# Patient Record
Sex: Female | Born: 1945 | Race: Black or African American | Hispanic: No | Smoking: Never smoker
Health system: Southern US, Academic
[De-identification: ages and names within clinical notes are randomized; demographics above are authoritative.]

## PROBLEM LIST (undated history)

## (undated) ENCOUNTER — Inpatient Hospital Stay: Payer: Self-pay

## (undated) DIAGNOSIS — Z83719 Family history of colon polyps, unspecified: Secondary | ICD-10-CM

## (undated) DIAGNOSIS — E039 Hypothyroidism, unspecified: Secondary | ICD-10-CM

## (undated) DIAGNOSIS — N63 Unspecified lump in unspecified breast: Secondary | ICD-10-CM

## (undated) DIAGNOSIS — E785 Hyperlipidemia, unspecified: Secondary | ICD-10-CM

## (undated) DIAGNOSIS — G2581 Restless legs syndrome: Secondary | ICD-10-CM

## (undated) DIAGNOSIS — J45909 Unspecified asthma, uncomplicated: Secondary | ICD-10-CM

## (undated) DIAGNOSIS — F32A Depression, unspecified: Secondary | ICD-10-CM

## (undated) DIAGNOSIS — F329 Major depressive disorder, single episode, unspecified: Secondary | ICD-10-CM

## (undated) DIAGNOSIS — D649 Anemia, unspecified: Secondary | ICD-10-CM

## (undated) DIAGNOSIS — K219 Gastro-esophageal reflux disease without esophagitis: Secondary | ICD-10-CM

## (undated) DIAGNOSIS — Z8371 Family history of colonic polyps: Secondary | ICD-10-CM

## (undated) DIAGNOSIS — Z800 Family history of malignant neoplasm of digestive organs: Secondary | ICD-10-CM

## (undated) DIAGNOSIS — N6310 Unspecified lump in the right breast, unspecified quadrant: Secondary | ICD-10-CM

## (undated) DIAGNOSIS — R112 Nausea with vomiting, unspecified: Secondary | ICD-10-CM

## (undated) DIAGNOSIS — H269 Unspecified cataract: Secondary | ICD-10-CM

## (undated) DIAGNOSIS — R12 Heartburn: Secondary | ICD-10-CM

## (undated) DIAGNOSIS — C801 Malignant (primary) neoplasm, unspecified: Secondary | ICD-10-CM

## (undated) DIAGNOSIS — E669 Obesity, unspecified: Secondary | ICD-10-CM

## (undated) DIAGNOSIS — I749 Embolism and thrombosis of unspecified artery: Secondary | ICD-10-CM

## (undated) DIAGNOSIS — E041 Nontoxic single thyroid nodule: Secondary | ICD-10-CM

## (undated) DIAGNOSIS — E119 Type 2 diabetes mellitus without complications: Secondary | ICD-10-CM

## (undated) DIAGNOSIS — R634 Abnormal weight loss: Secondary | ICD-10-CM

## (undated) DIAGNOSIS — Z973 Presence of spectacles and contact lenses: Secondary | ICD-10-CM

## (undated) DIAGNOSIS — I10 Essential (primary) hypertension: Secondary | ICD-10-CM

## (undated) DIAGNOSIS — H409 Unspecified glaucoma: Secondary | ICD-10-CM

## (undated) HISTORY — DX: Abnormal weight loss: R63.4

## (undated) HISTORY — DX: Unspecified cataract: H26.9

## (undated) HISTORY — DX: Essential (primary) hypertension: I10

## (undated) HISTORY — DX: Embolism and thrombosis of unspecified artery (CMS HCC): I74.9

## (undated) HISTORY — DX: Family history of malignant neoplasm of digestive organs: Z80.0

## (undated) HISTORY — DX: Unspecified lump in the right breast, unspecified quadrant: N63.10

## (undated) HISTORY — PX: HX COLONOSCOPY: 2100001147

## (undated) HISTORY — PX: HX BREAST BIOPSY: SHX20

## (undated) HISTORY — PX: HYSTERECTOMY: SHX81

## (undated) HISTORY — PX: CATARACT EXTRACTION, BILATERAL: SHX1313

## (undated) HISTORY — PX: SHOULDER SURGERY: SHX246

## (undated) HISTORY — DX: Unspecified glaucoma: H40.9

---
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
## 1973-10-10 HISTORY — PX: APPENDECTOMY: SHX54

## 1974-10-10 HISTORY — PX: ABDOMINAL HYSTERECTOMY: SHX81

## 1991-10-11 HISTORY — PX: ABDOMINOPLASTY: SUR9

## 1992-09-10 ENCOUNTER — Ambulatory Visit: Admit: 1992-09-10 | Disposition: A | Discharge status: Home / Self Care | Payer: Self-pay | Source: Ambulatory Visit

## 1993-12-17 ENCOUNTER — Ambulatory Visit: Admit: 1993-12-17 | Disposition: A | Discharge status: Home / Self Care | Payer: Self-pay | Source: Ambulatory Visit

## 1994-08-25 ENCOUNTER — Ambulatory Visit: Admit: 1994-08-25 | Disposition: A | Discharge status: Home / Self Care | Payer: Self-pay | Source: Ambulatory Visit

## 1994-09-02 ENCOUNTER — Ambulatory Visit: Admit: 1994-09-02 | Disposition: A | Discharge status: Home / Self Care | Payer: Self-pay | Source: Ambulatory Visit

## 1995-12-27 ENCOUNTER — Ambulatory Visit: Admit: 1995-12-27 | Disposition: A | Discharge status: Home / Self Care | Payer: Self-pay | Source: Ambulatory Visit

## 1996-02-02 ENCOUNTER — Ambulatory Visit: Admit: 1996-02-02 | Disposition: A | Discharge status: Home / Self Care | Payer: Self-pay | Source: Ambulatory Visit

## 1996-07-02 ENCOUNTER — Ambulatory Visit: Admit: 1996-07-02 | Disposition: A | Discharge status: Home / Self Care | Payer: Self-pay | Source: Ambulatory Visit

## 1997-10-10 HISTORY — PX: HX HYSTERECTOMY: SHX81

## 2001-11-08 ENCOUNTER — Ambulatory Visit: Payer: Self-pay

## 2001-12-04 ENCOUNTER — Ambulatory Visit: Payer: Self-pay

## 2002-10-30 ENCOUNTER — Ambulatory Visit: Payer: Self-pay

## 2002-12-07 ENCOUNTER — Ambulatory Visit: Payer: Self-pay

## 2003-07-10 ENCOUNTER — Emergency Department: Payer: Self-pay

## 2003-12-24 ENCOUNTER — Ambulatory Visit: Payer: Self-pay

## 2005-01-06 ENCOUNTER — Ambulatory Visit: Payer: Self-pay

## 2006-01-06 ENCOUNTER — Ambulatory Visit: Payer: Self-pay

## 2006-05-27 ENCOUNTER — Ambulatory Visit: Payer: Self-pay

## 2006-06-02 ENCOUNTER — Other Ambulatory Visit: Payer: Self-pay

## 2006-06-08 ENCOUNTER — Ambulatory Visit: Payer: Self-pay | Admitting: Obstetrics & Gynecology

## 2006-10-10 HISTORY — PX: BLADDER SUSPENSION: SHX72

## 2007-01-09 ENCOUNTER — Ambulatory Visit: Payer: Self-pay

## 2007-02-12 ENCOUNTER — Ambulatory Visit: Payer: Self-pay

## 2007-06-07 ENCOUNTER — Ambulatory Visit: Payer: Self-pay

## 2007-06-22 ENCOUNTER — Ambulatory Visit: Payer: Self-pay

## 2007-06-29 ENCOUNTER — Ambulatory Visit: Payer: Self-pay

## 2008-01-10 ENCOUNTER — Ambulatory Visit: Payer: Self-pay

## 2008-03-25 ENCOUNTER — Ambulatory Visit: Payer: Self-pay

## 2008-04-15 ENCOUNTER — Ambulatory Visit: Payer: Self-pay | Admitting: Internal Medicine

## 2008-04-29 ENCOUNTER — Emergency Department: Payer: Self-pay | Admitting: Emergency Medicine

## 2008-11-28 ENCOUNTER — Ambulatory Visit: Payer: Self-pay | Admitting: Internal Medicine

## 2009-01-01 ENCOUNTER — Ambulatory Visit: Payer: Self-pay | Admitting: Cardiovascular Disease

## 2009-01-12 ENCOUNTER — Ambulatory Visit: Admission: RE | Admit: 2009-01-12 | Payer: Self-pay | Source: Ambulatory Visit | Admitting: EXTERNAL

## 2009-05-07 ENCOUNTER — Ambulatory Visit: Payer: Self-pay | Admitting: Internal Medicine

## 2009-11-09 ENCOUNTER — Ambulatory Visit: Admission: RE | Admit: 2009-11-09 | Payer: Self-pay | Source: Ambulatory Visit | Admitting: EXTERNAL

## 2010-01-13 ENCOUNTER — Ambulatory Visit: Admission: RE | Admit: 2010-01-13 | Payer: Self-pay | Source: Ambulatory Visit | Admitting: EXTERNAL

## 2010-02-25 ENCOUNTER — Ambulatory Visit (INDEPENDENT_AMBULATORY_CARE_PROVIDER_SITE_OTHER): Payer: Managed Care, Other (non HMO) | Admitting: Orthopaedic Surgery

## 2010-03-25 ENCOUNTER — Encounter (INDEPENDENT_AMBULATORY_CARE_PROVIDER_SITE_OTHER): Payer: Managed Care, Other (non HMO) | Admitting: Orthopaedic Surgery

## 2010-03-30 ENCOUNTER — Ambulatory Visit: Admission: RE | Admit: 2010-03-30 | Payer: Self-pay | Source: Ambulatory Visit | Admitting: Orthopaedic Surgery

## 2010-04-05 ENCOUNTER — Encounter (INDEPENDENT_AMBULATORY_CARE_PROVIDER_SITE_OTHER): Payer: Managed Care, Other (non HMO) | Admitting: Orthopaedic Surgery

## 2010-04-05 ENCOUNTER — Encounter (INDEPENDENT_AMBULATORY_CARE_PROVIDER_SITE_OTHER): Payer: Self-pay | Admitting: Orthopaedic Surgery

## 2010-06-17 ENCOUNTER — Ambulatory Visit: Payer: Self-pay | Admitting: Internal Medicine

## 2010-06-18 ENCOUNTER — Ambulatory Visit: Payer: Self-pay | Admitting: Internal Medicine

## 2010-08-18 ENCOUNTER — Ambulatory Visit: Admit: 2010-08-18 | Payer: Self-pay | Source: Ambulatory Visit | Admitting: EXTERNAL

## 2010-12-20 ENCOUNTER — Ambulatory Visit: Payer: Self-pay | Admitting: Internal Medicine

## 2011-01-17 ENCOUNTER — Ambulatory Visit: Admission: RE | Admit: 2011-01-17 | Payer: Self-pay | Source: Ambulatory Visit | Admitting: EXTERNAL

## 2011-04-05 ENCOUNTER — Ambulatory Visit
Admission: RE | Admit: 2011-04-05 | Discharge: 2011-04-05 | Payer: Self-pay | Source: Ambulatory Visit | Attending: EXTERNAL | Admitting: EXTERNAL

## 2011-04-14 ENCOUNTER — Ambulatory Visit
Admission: RE | Admit: 2011-04-14 | Discharge: 2011-04-14 | Payer: Self-pay | Source: Ambulatory Visit | Attending: EXTERNAL | Admitting: EXTERNAL

## 2011-06-20 ENCOUNTER — Ambulatory Visit: Payer: Self-pay | Admitting: Internal Medicine

## 2011-12-26 ENCOUNTER — Other Ambulatory Visit (HOSPITAL_BASED_OUTPATIENT_CLINIC_OR_DEPARTMENT_OTHER): Payer: Self-pay

## 2012-01-18 ENCOUNTER — Ambulatory Visit (HOSPITAL_BASED_OUTPATIENT_CLINIC_OR_DEPARTMENT_OTHER)
Admission: RE | Admit: 2012-01-18 | Discharge: 2012-01-18 | Disposition: A | Discharge status: Home / Self Care | Payer: 59 | Source: Ambulatory Visit

## 2012-07-02 ENCOUNTER — Ambulatory Visit: Payer: Self-pay | Admitting: Internal Medicine

## 2012-07-06 ENCOUNTER — Ambulatory Visit: Payer: Self-pay | Admitting: Internal Medicine

## 2012-10-16 ENCOUNTER — Other Ambulatory Visit (HOSPITAL_BASED_OUTPATIENT_CLINIC_OR_DEPARTMENT_OTHER): Payer: Self-pay

## 2012-10-16 ENCOUNTER — Ambulatory Visit (HOSPITAL_BASED_OUTPATIENT_CLINIC_OR_DEPARTMENT_OTHER)
Admission: RE | Admit: 2012-10-16 | Discharge: 2012-10-16 | Disposition: A | Discharge status: Home / Self Care | Payer: 59 | Source: Ambulatory Visit

## 2012-10-16 DIAGNOSIS — R229 Localized swelling, mass and lump, unspecified: Secondary | ICD-10-CM | POA: Insufficient documentation

## 2013-01-03 ENCOUNTER — Other Ambulatory Visit (HOSPITAL_BASED_OUTPATIENT_CLINIC_OR_DEPARTMENT_OTHER): Payer: Self-pay

## 2013-01-21 ENCOUNTER — Other Ambulatory Visit (HOSPITAL_BASED_OUTPATIENT_CLINIC_OR_DEPARTMENT_OTHER): Payer: Self-pay

## 2013-01-21 ENCOUNTER — Ambulatory Visit (HOSPITAL_BASED_OUTPATIENT_CLINIC_OR_DEPARTMENT_OTHER)
Admission: RE | Admit: 2013-01-21 | Discharge: 2013-01-21 | Disposition: A | Discharge status: Home / Self Care | Payer: 59 | Source: Ambulatory Visit

## 2013-01-21 DIAGNOSIS — Z1231 Encounter for screening mammogram for malignant neoplasm of breast: Secondary | ICD-10-CM | POA: Insufficient documentation

## 2013-01-28 ENCOUNTER — Other Ambulatory Visit (HOSPITAL_BASED_OUTPATIENT_CLINIC_OR_DEPARTMENT_OTHER): Payer: Self-pay | Admitting: Diagnostic Radiology

## 2013-01-28 ENCOUNTER — Other Ambulatory Visit (HOSPITAL_BASED_OUTPATIENT_CLINIC_OR_DEPARTMENT_OTHER): Payer: Self-pay

## 2013-01-28 ENCOUNTER — Other Ambulatory Visit: Payer: Self-pay

## 2013-01-28 ENCOUNTER — Inpatient Hospital Stay (HOSPITAL_BASED_OUTPATIENT_CLINIC_OR_DEPARTMENT_OTHER)
Admission: RE | Admit: 2013-01-28 | Discharge: 2013-01-28 | Disposition: A | Discharge status: Home / Self Care | Payer: 59 | Source: Ambulatory Visit

## 2013-01-28 ENCOUNTER — Other Ambulatory Visit (HOSPITAL_BASED_OUTPATIENT_CLINIC_OR_DEPARTMENT_OTHER): Payer: 59

## 2013-01-28 DIAGNOSIS — R928 Other abnormal and inconclusive findings on diagnostic imaging of breast: Secondary | ICD-10-CM | POA: Insufficient documentation

## 2013-01-28 DIAGNOSIS — N6019 Diffuse cystic mastopathy of unspecified breast: Secondary | ICD-10-CM | POA: Insufficient documentation

## 2013-01-28 MED ORDER — LIDOCAINE 1 %-EPINEPHRINE 1:100,000 INJECTION SOLUTION
15.00 mL | INTRAMUSCULAR | Status: DC
Start: 2013-01-28 — End: 2013-01-29
  Filled 2013-01-28: qty 20

## 2013-01-28 MED ORDER — LIDOCAINE 1 %-EPINEPHRINE 1:100,000 INJECTION SOLUTION
15.00 mL | INTRAMUSCULAR | Status: AC
Start: 2013-01-28 — End: 2013-01-28
  Administered 2013-01-28: 18 mL via INTRAMUSCULAR
  Filled 2013-01-28: qty 20

## 2013-01-29 LAB — HISTORICAL SURGICAL PATHOLOGY SPECIMEN

## 2013-01-30 ENCOUNTER — Other Ambulatory Visit (HOSPITAL_BASED_OUTPATIENT_CLINIC_OR_DEPARTMENT_OTHER): Payer: Self-pay | Admitting: Diagnostic Radiology

## 2013-02-26 ENCOUNTER — Ambulatory Visit: Payer: 59 | Admitting: Geriatric Medicine

## 2013-02-26 ENCOUNTER — Encounter: Payer: Self-pay | Admitting: Geriatric Medicine

## 2013-02-26 VITALS — BP 120/70 | HR 56 | Temp 97.8°F | Resp 14 | Ht 65.0 in | Wt 168.0 lb

## 2013-02-26 NOTE — Progress Notes (Signed)
Subjective:     Patient ID:  Carrie Escobar is an 67 y.o. female     Chief Complaint:    Chief Complaint   Patient presents with   . Shoulder Pain       Patient is a 67 y.o. female presenting with shoulder injury. The history is provided by the patient. No language interpreter was used.   Shoulder Injury   Incident location: no recent injury/fall. 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 The right shoulder is affected. Incident onset: one month. The injury mechanism was repetitive motion. The quality of the pain is described as aching. The pain does not radiate. The pain is at a severity of 3/10. Pertinent negatives include no chest pain, muscle weakness, numbness or tingling. The symptoms are aggravated by palpation and overhead lifting. She has tried nothing for the symptoms. The treatment provided no relief.       Past Medical History  Current Outpatient Prescriptions   Medication Sig   . amLODIPine (NORVASC) 5 mg Oral Tablet Take 5 mg by mouth Once a day   . aspirin (ECOTRIN) 81 mg Oral Tablet, Delayed Release (E.C.) Take 81 mg by mouth Once a day   . bisoprolol-hydrochlorothiazide (ZIAC) 10-6.25 mg Oral Tablet Take 1 Tab by mouth Once a day   . calcium citrate-vitamin D3 (CITRACAL) 200 mg calcium -250 unit Oral Tablet Take 1 Tab by mouth Once a day     Allergies   Allergen Reactions   . Amoxicillin Rash     Past Medical History   Diagnosis Date   . Breast mass, right    . HTN (hypertension)    . Cataract    . Pulmonary emboli      only once in 1999 after her hysterectomy   . Family history of colon cancer      Past Surgical History   Procedure Laterality Date   . Hx hysterectomy  1999     fibroid     Family History   Problem Relation Age of Onset   . Stroke Mother    . Stroke Father    . Cancer Sister      ovaian   . Hypertension Mother    . Cancer Mother      colon     History     Social History   . Marital Status: Divorced     Spouse Name: N/A     Number of Children: 0    . Years of Education: N/A     Occupational History   .       Social History Main Topics   . Smoking status: Never Smoker    . Smokeless tobacco: Not on file   . Alcohol Use: No   . Drug Use: No   . Sexually Active: Not on file     Other Topics Concern   . Abuse/Domestic Violence No   . Right Hand Dominant Yes     Social History Narrative   . No narrative on file         Review of Systems   Constitutional: Negative.    HENT: Negative.  Negative for neck pain.    Respiratory: Negative.    Cardiovascular: Negative.  Negative for chest pain.   Musculoskeletal: Positive for joint pain. Negative for back pain.   Skin: Negative.    Neurological: Negative.  Negative for tingling and numbness.  Objective:     BP 120/70   Pulse 56   Temp(Src) 36.6 C (97.8 F) (Tympanic)   Resp 14   Ht 1.651 m (5\' 5" )   Wt 76.204 kg (168 lb)   BMI 27.96 kg/m2   SpO2 98%     Physical Exam   Constitutional: She is oriented to person, place, and time. She appears well-developed and well-nourished.   Neck: Normal range of motion and full passive range of motion without pain. Neck supple. No spinous process tenderness and no muscular tenderness present.   Pulm:  Effort normal.    Ortho/Musculoskeletal:   She exhibits no edema.   Right Shoulder Exam   She exhibits tenderness (over AC joint), deformity (lipoma) and pain. She exhibits normal range of motion, no swelling, no effusion, no crepitus, no laceration, no spasm, normal pulse and normal strength.        Arms:  Neurological: She is alert and oriented to person, place, and time. Coordination and gait normal.   Nursing note and vitals reviewed.          .    Assessment & Plan:       ICD-9-CM    1. Shoulder pain, right 719.41 XR SHOULDER RIGHT SERIES   2. 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

## 2013-02-26 NOTE — Progress Notes (Signed)
Pt is here today for right shoulder pain. York Spaniel that she has had this pain for about a month. 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

## 2013-05-06 ENCOUNTER — Other Ambulatory Visit (HOSPITAL_BASED_OUTPATIENT_CLINIC_OR_DEPARTMENT_OTHER): Payer: Self-pay | Admitting: Geriatric Medicine

## 2013-05-06 NOTE — Addendum Note (Signed)
 Addended by: MARLYCE GAUZE on: 05/06/2013 02:55 PM     Modules accepted: Orders

## 2013-05-13 ENCOUNTER — Ambulatory Visit (HOSPITAL_BASED_OUTPATIENT_CLINIC_OR_DEPARTMENT_OTHER)
Admission: RE | Admit: 2013-05-13 | Discharge: 2013-05-13 | Disposition: A | Discharge status: Home / Self Care | Payer: 59 | Source: Ambulatory Visit | Attending: Geriatric Medicine | Admitting: Geriatric Medicine

## 2013-05-13 DIAGNOSIS — D1739 Benign lipomatous neoplasm of skin and subcutaneous tissue of other sites: Secondary | ICD-10-CM | POA: Insufficient documentation

## 2013-05-13 NOTE — Addendum Note (Signed)
 Addended by: QUIN NEST on: 05/13/2013 04:11 PM     Modules accepted: Orders

## 2013-05-29 ENCOUNTER — Encounter (INDEPENDENT_AMBULATORY_CARE_PROVIDER_SITE_OTHER): Payer: Self-pay

## 2013-05-29 ENCOUNTER — Ambulatory Visit (INDEPENDENT_AMBULATORY_CARE_PROVIDER_SITE_OTHER): Payer: No Typology Code available for payment source | Admitting: Physician Assistant

## 2013-05-29 VITALS — BP 171/81 | HR 70 | Temp 98.5°F | Resp 20 | Ht 64.5 in | Wt 165.3 lb

## 2013-05-29 DIAGNOSIS — J209 Acute bronchitis, unspecified: Secondary | ICD-10-CM

## 2013-05-29 DIAGNOSIS — R059 Cough, unspecified: Secondary | ICD-10-CM

## 2013-05-29 DIAGNOSIS — J029 Acute pharyngitis, unspecified: Secondary | ICD-10-CM

## 2013-05-29 LAB — POCT RAPID STREP A: Rapid Strep A Screen POCT: NEGATIVE

## 2013-05-29 MED ORDER — MAGIC MOUTHWASH ORAL SUSPENSION
ORAL | Status: DC
Start: 2013-05-29 — End: 2014-04-04

## 2013-05-29 MED ORDER — AZITHROMYCIN 250 MG PO TABS
ORAL_TABLET | ORAL | Status: DC
Start: 2013-05-29 — End: 2014-04-04

## 2013-05-29 MED ORDER — BENZONATATE 100 MG PO CAPS
100.0000 mg | ORAL_CAPSULE | Freq: Three times a day (TID) | ORAL | Status: AC | PRN
Start: 2013-05-29 — End: 2013-06-08

## 2013-05-29 NOTE — Progress Notes (Signed)
Subjective:    Patient ID: Amy Christensen is a 67 y.o. female.    HPI: has had sore throat for 3 days. And cough started on the same day. Basically dry cough. No fever. Never had strep as she can recall. Symptoms are unchanged. No sick contact.         Review of Systems   Constitutional: Negative for fever, chills, diaphoresis, activity change, appetite change, fatigue and unexpected weight change.   HENT: Positive for sore throat. Negative for ear pain and neck pain.    Eyes: Negative for pain.   Respiratory: Positive for cough.    Cardiovascular: Negative for chest pain.   Gastrointestinal: Negative for abdominal pain.   Genitourinary: Negative for flank pain and pelvic pain.   Musculoskeletal: Negative for back pain.   Skin: Negative for rash.   Neurological: Negative for headaches.   Psychiatric/Behavioral: Negative for behavioral problems.         Objective:    BP 171/81  Pulse 70  Temp 98.5 F (36.9 C) (Oral)  Resp 20  Ht 1.638 m (5' 4.5")  Wt 74.98 kg (165 lb 4.8 oz)  BMI 27.95 kg/m2  SpO2 100%    Physical Exam   Nursing note and vitals reviewed.  Constitutional: She is oriented to person, place, and time. She appears well-developed and well-nourished.   HENT:   Head: Normocephalic and atraumatic.   Mouth/Throat: Oropharyngeal exudate and posterior oropharyngeal erythema present.   Eyes: EOM are normal.   Neck: Normal range of motion.   Cardiovascular: Normal rate.    Pulmonary/Chest: Effort normal. She has rhonchi.   Musculoskeletal: Normal range of motion.   Neurological: She is alert and oriented to person, place, and time.   Skin: Skin is warm.   Psychiatric: She has a normal mood and affect.         Assessment and Plan:       Amy Christensen was seen today for cough and sore throat.    Diagnoses and associated orders for this visit:    Acute bronchitis  - azithromycin (ZITHROMAX Z-PAK) 250 MG tablet; Take two tablets on day one and take one tablet from day 2 to day 5.    Pharyngitis  - POCT RAPID STREP A:  negative  - DiphenhydrAMINE HCl (MAGIC MOUTHWASH) suspension; Use 10 ml to swish around mouth and spit out every 8 hours as needed.    Cough  - benzonatate (TESSALON PERLES) 100 MG capsule; Take 1 capsule (100 mg total) by mouth 3 (three) times daily as needed for Cough.    Please go to ER if symptoms persist, worsen or new symptoms develop. Follow up with your Primary Care Physician or Return to Clinic as needed. Patient/Family verbalizes understanding.            Danella Deis, PA  [facility]  05/29/2013  5:34 PM

## 2013-05-29 NOTE — Patient Instructions (Signed)
Please go to ER if symptoms persist, worsen or new symptoms develop. Follow up with your Primary Care Physician or Return to Clinic as needed. Patient/Family verbalizes understanding.      Bronchitis (Adult: Abx Tx)    BRONCHITIS is an infection of the air passages ("bronchial tubes"). It often occurs during the common cold. Symptoms include cough with mucus (phlegm) and low-grade fever. Bronchitis usually lasts 7-14 days. Mild cases can be treated with simple home remedies. More severe infection is treated with an antibiotic.    Home Care:  1. If symptoms are severe, rest at home for the first 2-3 days. When you resume activity, don't let yourself get too tired.  2. Do not smoke. Avoid being exposed to the smoke of others.  3. You may use acetaminophen (Tylenol) or ibuprofen (Motrin, Advil) to control fever or pain, unless another medicine was prescribed for this. [NOTE: If you have chronic liver or kidney disease or ever had a stomach ulcer or GI bleeding, talk with your doctor before using these medicines.]  4. Your appetite may be poor, so a light diet is fine. Avoid dehydration by drinking 6-8 glasses of fluids per day (water, soft, drinks, juices, tea, soup, etc.). Extra fluids will help loosen secretions in the lungs.  5. Over-the-counter cough medicines that contain"dextromethorphan"(such as Robitussin DM) and decongestants (Actifed or Sudafed) may help relieve cough and congestion. [NOTE: Do not use decongestants if you have high blood pressure.]  6. Finish all antibiotic medicine, even if you are feeling better after only a few days.  Follow Up  with your doctor or as directed if you don't start to feel better after three days.  [NOTE: If you are age 65 or older, or if you have chronic asthma or COPD, we recommend a PNEUMOCOCCAL VACCINATION every five years and a yearly INFLUENZAVACCINATION (FLU-SHOT) every autumn. Ask your doctor about this. If you had an X-ray, a radiologist will review it. You will  be notified of any new findings that may affect your care.]  Get Prompt Medical Attention  if any of the following occur:   Fever over 100.4F (38.0C) for more than three days   Trouble breathing, wheezing or pain with breathing   Coughing up blood or increased amounts of colored sputum   Weakness, drowsiness, headache, facial pain, ear pain or a stiff neck   2000-2014 [employer], [address], [place] [address]. All rights reserved. This information is not intended as a substitute for professional medical care. Always follow your healthcare professional's instructions.

## 2013-06-02 ENCOUNTER — Telehealth (INDEPENDENT_AMBULATORY_CARE_PROVIDER_SITE_OTHER): Payer: Self-pay

## 2013-06-02 NOTE — Telephone Encounter (Signed)
Spoke to patient and she stated she was doing better other than her cough. She told me she was taking her medications as prescribed. I asked if she had any questions and she said "no."  I advised her to call if any questions or concerns may arise.

## 2013-06-06 ENCOUNTER — Encounter: Payer: Self-pay | Admitting: Geriatric Medicine

## 2013-06-06 ENCOUNTER — Ambulatory Visit: Payer: 59 | Admitting: Geriatric Medicine

## 2013-06-06 VITALS — BP 130/80 | HR 60 | Temp 97.1°F | Resp 14 | Ht 66.0 in | Wt 166.0 lb

## 2013-06-06 MED ORDER — HYDROCODONE 10 MG-CHLORPHENIRAMINE 8 MG/5 ML ORAL SUSP EXTEND.REL 12HR
5.00 mL | Freq: Two times a day (BID) | ORAL | Status: DC | PRN
Start: 2013-06-06 — End: 2013-08-05

## 2013-06-06 MED ORDER — PREDNISONE 20 MG TABLET
20.00 mg | ORAL_TABLET | Freq: Two times a day (BID) | ORAL | Status: DC
Start: 2013-06-06 — End: 2013-08-05

## 2013-06-06 NOTE — Patient Instructions (Signed)
Drink plenty of water,take Tylenol and /or Ibuprofen as needed for pain and fever, nose irrigation with saline (mild salt water), gargle with warm salt water 2-3 times a day.

## 2013-06-06 NOTE — Progress Notes (Signed)
 Subjective:     Patient ID:  Carrie Escobar is an 67 y.o. female     Chief Complaint:    Chief Complaint   Patient presents with   . Coughing   . Chest Congestion       The history is provided by the patient. No language interpreter was used.   Pt is here today for coughing and chest congestion. Saud that she went to urgent care last week and was told she had bronchitis but seems like her cough an dcongestion is not getting much better. She was given Zpak and Tessalon Perle w/o sig help. She still c/o cough and scratchy throat.  No fever/chills.      Past Medical History  Current Outpatient Prescriptions   Medication Sig   . amLODIPine  (NORVASC ) 5 mg Oral Tablet Take 5 mg by mouth Once a day   . aspirin  (ECOTRIN) 81 mg Oral Tablet, Delayed Release (E.C.) Take 81 mg by mouth Once a day   . Biotin 10 mg Oral Tablet Take 1 Tab by mouth Once a day   . bisoprolol -hydrochlorothiazide  (ZIAC ) 10-6.25 mg Oral Tablet Take 1 Tab by mouth Once a day   . calcium  citrate-vitamin D3 (CITRACAL) 200 mg calcium  -250 unit Oral Tablet Take 1 Tab by mouth Once a day   . multivitamin Oral Tablet Take 1 Tab by mouth Once a day     Allergies   Allergen Reactions   . Amoxicillin Rash     Past Medical History   Diagnosis Date   . Breast mass, right    . HTN (hypertension)    . Cataract    . Pulmonary emboli      only once in 1999 after her hysterectomy   . Family history of colon cancer      Past Surgical History   Procedure Laterality Date   . Hx hysterectomy  1999     fibroid     Family History   Problem Relation Age of Onset   . Stroke Mother    . Stroke Father    . Cancer Sister      ovaian   . Hypertension Mother    . Cancer Mother      colon     History     Social History   . Marital Status: Divorced     Spouse Name: N/A     Number of Children: 0   . Years of Education: N/A     Occupational History   .       Social History Main Topics   . Smoking status: Never Smoker    . Smokeless tobacco: Not on file   . Alcohol Use: No   .  Drug Use: No   . Sexually Active: Not on file     Other Topics Concern   . Abuse/Domestic Violence No   . Right Hand Dominant Yes     Social History Narrative   . No narrative on file         Review of Systems   Constitutional: Negative.    HENT: Negative.    Respiratory: Positive for cough. Negative for hemoptysis, sputum production and shortness of breath.    Cardiovascular: Negative.    Skin: Negative.        Objective:     BP 130/80  Pulse 60  Temp(Src) 36.2 C (97.1 F) (Tympanic)  Resp 14  Ht 1.676 m (5' 6)  Wt 75.297 kg (166 lb)  BMI 26.81 kg/m2  SpO2 99%     Physical Exam   Constitutional: She appears well-developed and well-nourished.   HENT:   Head: Normocephalic and atraumatic.   Right Ear: External ear normal.   Left Ear: External ear normal.   Neck: Normal range of motion. Neck supple.   Cardiovascular: Normal rate, regular rhythm and normal heart sounds.    Pulm:         Mild coarse BS bilat Effort normal.    Lymphadenopathy:     She has no cervical adenopathy.   Nursing note and vitals reviewed.        .    Assessment & Plan:       ICD-9-CM    1. Bronchitis 490 predniSONE  (DELTASONE ) 20 mg Oral Tablet     chlorpheniramine -HYDROcodone  (TUSSIONEX) 8-10 mg/5 mL Oral Suspension, Sust.Release 12 hr   Drink plenty of water ,take Tylenol  and /or Ibuprofen  as needed for pain and fever, nose irrigation with saline (mild salt water ), gargle with warm salt water  2-3 times a day.

## 2013-06-06 NOTE — Progress Notes (Signed)
Pt is here today for coughing and chest congestion. Carrie Escobar that she went to urgent care last week and was told she had bronchitis but seems like her cough an dcongestion is not getting much better. [name], [place]

## 2013-07-24 ENCOUNTER — Other Ambulatory Visit: Payer: Self-pay | Admitting: Geriatric Medicine

## 2013-08-05 ENCOUNTER — Encounter: Payer: Self-pay | Admitting: Geriatric Medicine

## 2013-08-05 ENCOUNTER — Ambulatory Visit: Payer: 59 | Admitting: Geriatric Medicine

## 2013-08-05 VITALS — BP 130/80 | HR 71 | Temp 97.2°F | Resp 14 | Ht 66.0 in | Wt 168.0 lb

## 2013-08-05 NOTE — Progress Notes (Signed)
 Subjective:     Patient ID:  Carrie Escobar is an 67 y.o. female     Chief Complaint:    Chief Complaint   Patient presents with   . Shoulder Pain     follow up       The history is provided by the patient. No language interpreter was used.   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 Incident onset: 5 months. The injury mechanism was repetitive motion. The quality of the pain is described as aching. The pain does not radiate. The pain is at a severity of 4/10. Pertinent negatives include no chest pain, muscle weakness, numbness or tingling. The symptoms are aggravated by palpation and overhead lifting. She has tried NSAIDs and warm compresses for the symptoms. The treatment provided no relief.     Right shoulder x ray on 05/13/13:  RIGHT SHOULDER SERIES   REASON FOR EXAMINATION: Right shoulder pain.   FINDINGS: There is no evidence of fracture, dislocation or other bony abnormality. The visualized joint spaces appear normal and no soft tissue calcifications are seen.   IMPRESSION:   Normal right shoulder.      Past Medical History  Current Outpatient Prescriptions   Medication Sig   . amLODIPine  (NORVASC ) 5 mg Oral Tablet Take 5 mg by mouth Once a day   . aspirin  (ECOTRIN) 81 mg Oral Tablet, Delayed Release (E.C.) Take 81 mg by mouth Once a day   . Biotin 10 mg Oral Tablet Take 1 Tab by mouth Once a day   . bisoprolol -hydrochlorothiazide  (ZIAC ) 10-6.25 mg Oral Tablet Take 1 Tab by mouth Once a day   . calcium  citrate-vitamin D3 (CITRACAL) 200 mg calcium  -250 unit Oral Tablet Take 1 Tab by mouth Once a day   . multivitamin Oral Tablet Take 1 Tab by mouth Once a day     Allergies   Allergen Reactions   . Amoxicillin Rash     Past Medical History   Diagnosis Date   . Breast mass, right    . HTN (hypertension)    . Cataract    . Pulmonary emboli      only once in 1999 after her hysterectomy   . Family history of colon cancer      Past Surgical History   Procedure  Laterality Date   . Hx hysterectomy  1999     fibroid     Family History   Problem Relation Age of Onset   . Stroke Mother    . Stroke Father    . Cancer Sister      ovaian   . Hypertension Mother    . Cancer Mother      colon     History     Social History   . Marital Status: Divorced     Spouse Name: N/A     Number of Children: 0   . Years of Education: N/A     Occupational History   .       Social History Main Topics   . Smoking status: Never Smoker    . Smokeless tobacco: Not on file   . Alcohol Use: No   . Drug Use: No   . Sexual Activity: Not on file     Other Topics Concern   . Abuse/Domestic Violence No   . Right Hand Dominant Yes     Social History Narrative   . No  narrative on file         ROS  Constitutional: Negative.   HENT: Negative. Negative for neck pain.   Respiratory: Negative.   Cardiovascular: Negative. Negative for chest pain.   Musculoskeletal: Positive for joint pain. Negative for back pain.   Skin: Negative.   Neurological: Negative. Negative for tingling and numbness.   Objective:     BP 130/80  Pulse 71  Temp(Src) 36.2 C (97.2 F) (Tympanic)  Resp 14  Ht 1.676 m (5' 6)  Wt 76.204 kg (168 lb)  BMI 27.13 kg/m2  SpO2 97%     Physical Exam  Constitutional: She is oriented to person, place, and time. She appears well-developed and well-nourished.   Neck: Normal range of motion and full passive range of motion without pain. Neck supple. No spinous process tenderness and no muscular tenderness present.   Pulm: Effort normal.   Ortho/Musculoskeletal:   She exhibits no edema.   Right Shoulder Exam   She exhibits tenderness (over AC joint), deformity (lipoma) and pain. She exhibits decreased range of motion, no swelling, no effusion, no crepitus, no laceration, no spasm, normal pulse and normal strength.   Arms:  Neurological: She is alert and oriented to person, place, and time. Coordination and gait normal.   Nursing note and vitals reviewed.      .    Assessment & Plan:       ICD-9-CM       1. Rotator cuff syndrome of right shoulder 726.10 OUTSIDE CONSULT/REFERRAL PROVIDER(AMB)

## 2013-08-05 NOTE — Progress Notes (Signed)
Pt is here today for a follow up on her shoulder pain. Tadan Shill, MA

## 2013-08-07 ENCOUNTER — Ambulatory Visit (HOSPITAL_BASED_OUTPATIENT_CLINIC_OR_DEPARTMENT_OTHER)
Admission: RE | Admit: 2013-08-07 | Discharge: 2013-08-07 | Disposition: A | Discharge status: Home / Self Care | Payer: Medicare PPO | Source: Ambulatory Visit | Attending: Geriatric Medicine | Admitting: Geriatric Medicine

## 2013-08-07 DIAGNOSIS — N63 Unspecified lump in unspecified breast: Secondary | ICD-10-CM | POA: Insufficient documentation

## 2013-08-07 DIAGNOSIS — Z9889 Other specified postprocedural states: Secondary | ICD-10-CM | POA: Insufficient documentation

## 2013-09-27 ENCOUNTER — Other Ambulatory Visit (HOSPITAL_BASED_OUTPATIENT_CLINIC_OR_DEPARTMENT_OTHER): Payer: Self-pay | Admitting: Orthopaedic Surgery

## 2013-09-30 ENCOUNTER — Encounter: Payer: Self-pay | Admitting: Geriatric Medicine

## 2013-09-30 ENCOUNTER — Ambulatory Visit: Payer: Medicare PPO | Admitting: Geriatric Medicine

## 2013-09-30 VITALS — BP 120/80 | HR 50 | Temp 97.0°F | Resp 14 | Ht 66.0 in | Wt 170.0 lb

## 2013-09-30 NOTE — Progress Notes (Signed)
PT is here today for a pre op evaluation for cataract surgery. Carrie Escobar, [place]

## 2013-09-30 NOTE — Progress Notes (Signed)
Subjective:     Patient ID:  Carrie Escobar is an 67 y.o. female     Chief Complaint:    Chief Complaint   Patient presents with    Pre-op       HPI  Here for pro op evaluation, for cataract, DOS: 10/22/2013  Past Medical History  Current Outpatient Prescriptions   Medication Sig    amLODIPine (NORVASC) 5 mg Oral Tablet Take 5 mg by mouth Once a day    aspirin (ECOTRIN) 81 mg Oral Tablet, Delayed Release (E.C.) Take 81 mg by mouth Once a day    Biotin 10 mg Oral Tablet Take 1 Tab by mouth Once a day    bisoprolol-hydrochlorothiazide (ZIAC) 10-6.25 mg Oral Tablet Take 1 Tab by mouth Once a day    calcium citrate-vitamin D3 (CITRACAL) 200 mg calcium -250 unit Oral Tablet Take 1 Tab by mouth Once a day    multivitamin Oral Tablet Take 1 Tab by mouth Once a day     Allergies   Allergen Reactions    Amoxicillin Rash     Past Medical History   Diagnosis Date    Breast mass, right      benign    HTN (hypertension)     Cataract      bilat    Pulmonary emboli      only once in 1999 after her hysterectomy    Family history of colon cancer      Past Surgical History   Procedure Laterality Date    Hx hysterectomy  1999     fibroid     Family History   Problem Relation Age of Onset    Stroke Mother     Stroke Father     Cancer Sister      ovaian    Hypertension Mother     Cancer Mother      colon     History     Social History    Marital Status: Divorced     Spouse Name: N/A     Number of Children: 0    Years of Education: N/A     Occupational History          Social History Main Topics    Smoking status: Never Smoker     Smokeless tobacco: Not on file    Alcohol Use: No    Drug Use: No    Sexual Activity: Not on file     Other Topics Concern    Abuse/Domestic Violence No    Drives Yes    Special Diet No    Uses Cane No    Uses Walker No    Uses Wheelchair No    Right Hand Dominant Yes    Left Hand Dominant No     Social History Narrative    No narrative on file          ROS    Objective:     BP 120/80   Pulse 50   Temp(Src) 36.1 C (97 F) (Tympanic)   Resp 14   Ht 1.676 m (5\' 6" )   Wt 77.111 kg (170 lb)   BMI 27.45 kg/m2   SpO2 99%     Physical Exam      .    Assessment & Plan:       ICD-9-CM   1. Pre-op evaluation V72.84   2. 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  Clear for cataract procedure, left  eye

## 2013-10-02 ENCOUNTER — Ambulatory Visit (HOSPITAL_BASED_OUTPATIENT_CLINIC_OR_DEPARTMENT_OTHER)
Admission: RE | Admit: 2013-10-02 | Discharge: 2013-10-02 | Disposition: A | Discharge status: Home / Self Care | Payer: Medicare PPO | Source: Ambulatory Visit | Attending: Orthopaedic Surgery | Admitting: Orthopaedic Surgery

## 2013-10-02 DIAGNOSIS — M719 Bursopathy, unspecified: Secondary | ICD-10-CM | POA: Insufficient documentation

## 2013-10-10 HISTORY — PX: HX CATARACT REMOVAL: SHX102

## 2013-11-04 HISTORY — PX: HX ROTATOR CUFF REPAIR: SHX139

## 2013-11-17 IMAGING — RF DG BARIUM SWALLOW
6 series · 14 of 18 positions shown · non-contrast
Comparison: none

REASON FOR EXAM: Dysphagia
COMMENTS:

PROCEDURE:     FL  - FL BARIUM SWALLOW  - July 06, 2012 [DATE]
RESULT:
Procedure: Dynamic imaging of the cervical esophagus was performed during
active swallowing. This was followed by the administration of effervescent
crystals and evaluation of the remaining components of the esophagus.

[Series 1: fluoro_barium 2fps_bw · 0.17mm/px · 3 of 19 frames shown (1 of 6)]
[frame 3/19]
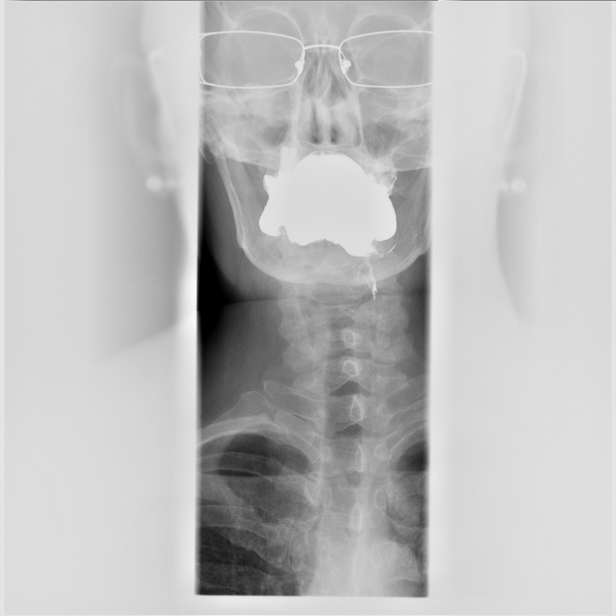
[frame 7/19]
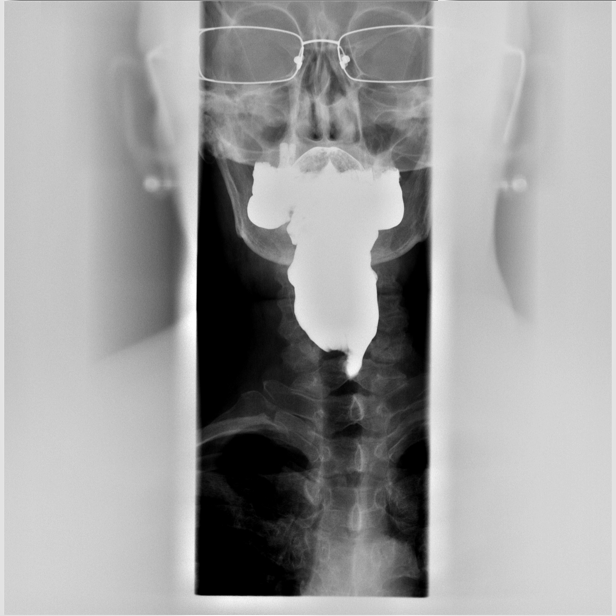
[frame 17/19]
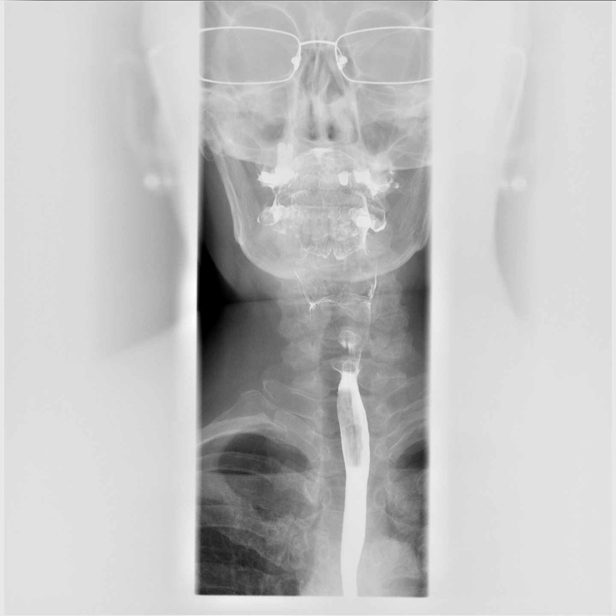

[Series 2: fluoro_barium 2fps_bw · 0.17mm/px · 3 of 15 frames shown (2 of 6)]
[frame 3/15]
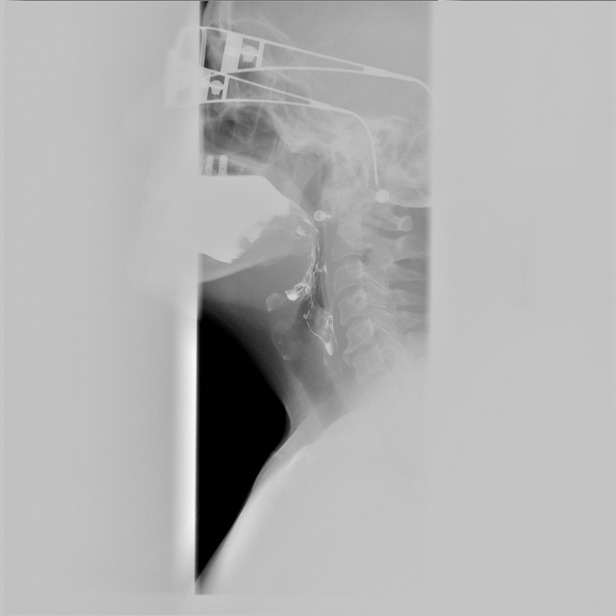
[frame 7/15]
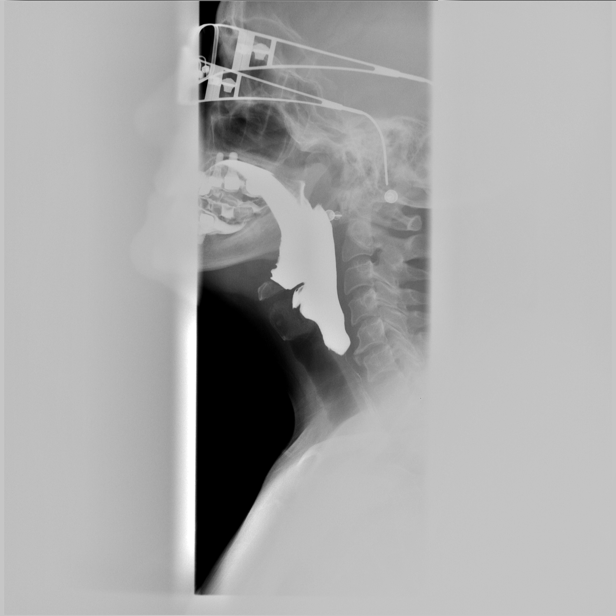
[frame 13/15]
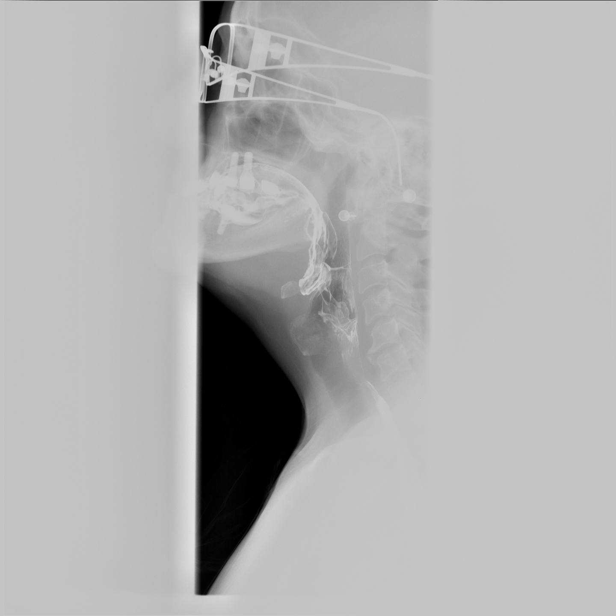

[Series 3: fluoro_barium 2fps_bw · 0.17mm/px · 3 of 11 frames shown (3 of 6)]
[frame 1/11]
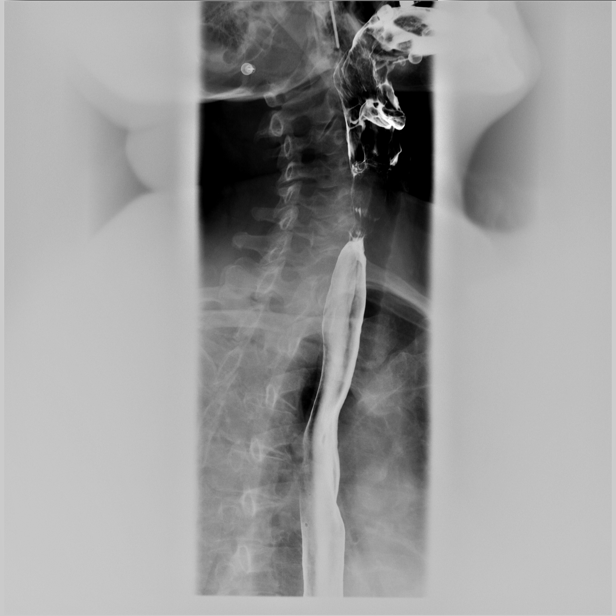
[frame 2/11]
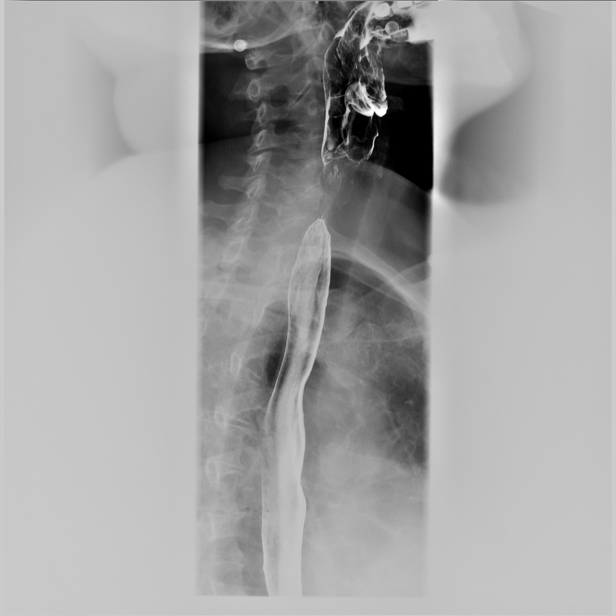
[frame 6/11]
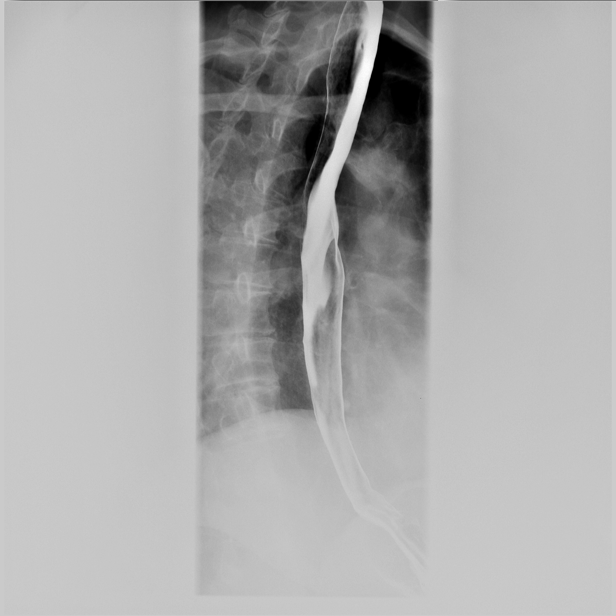

[Series 4: fluoro_barium 2fps_bw · 0.17mm/px · 1 of 1 slices shown (4 of 6)]
[im 1/1]
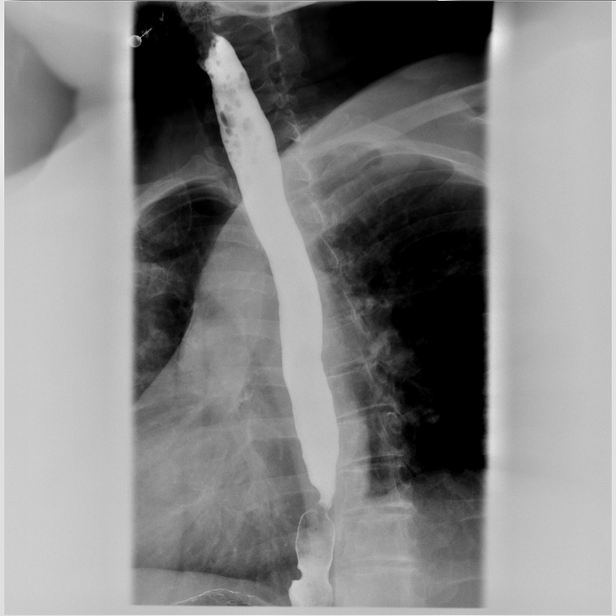

[Series 5: fluoro_barium 2fps_bw · 0.17mm/px · 2 of 2 frames shown (5 of 6)]
[frame 1/2]
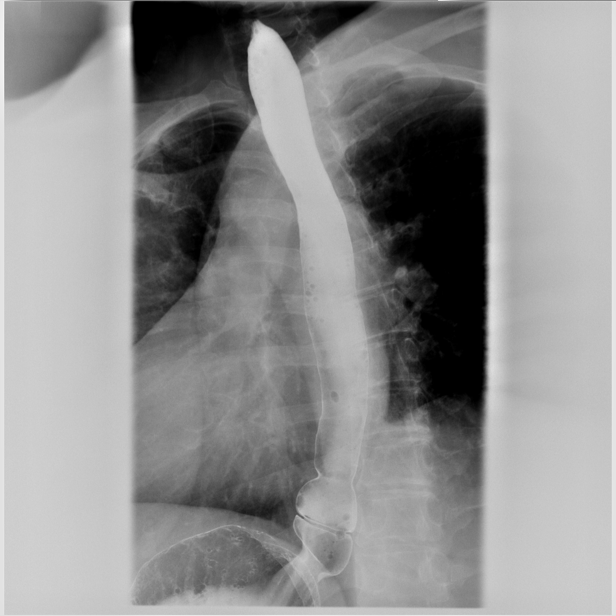
[frame 2/2]
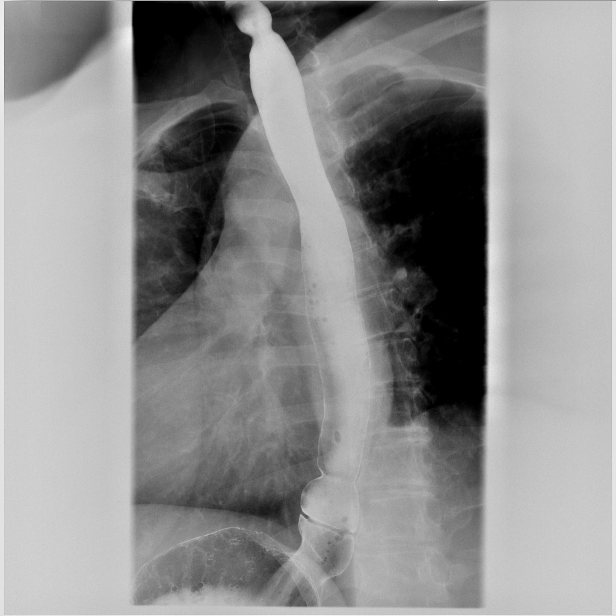

[Series 6: fluoro_barium 2fps_bw · 0.17mm/px · 2 of 3 frames shown (6 of 6)]
[frame 2/3]
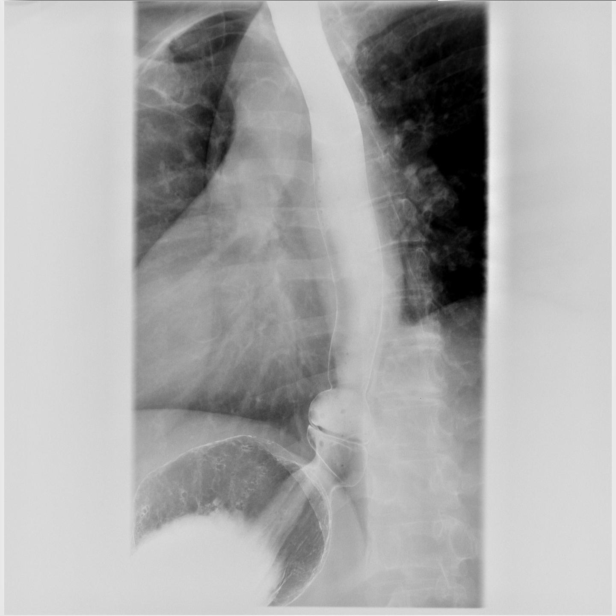
[frame 3/3]
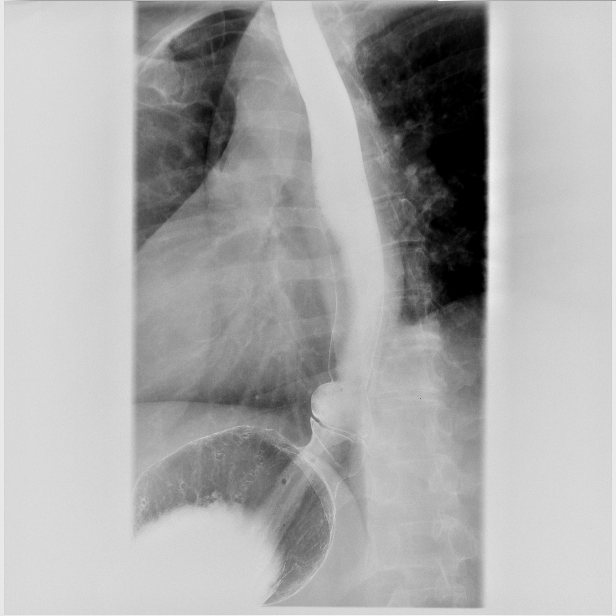

[14 of 18 positions shown; findings below may reference images not displayed]

FINDINGS: The cervical esophagus is unremarkable. There is to-and-fro motion
within the esophagus and a decreased primary stripping wave which is
moderate during swallowing. There is appropriate relaxation of the lower
esophageal sphincter. A very mild sliding hiatal hernia is identified. A 13
mm Barotab was administered which traversed the esophagus without
complications. Gastroesophageal reflux was elicited to the upper esophagus.
IMPRESSION: 1. Severe gastroesophageal reflux.
2. Moderate esophageal dysmotility.
3. Very small sliding hiatal hernia.

## 2014-01-15 ENCOUNTER — Ambulatory Visit: Payer: Medicare PPO | Admitting: Geriatric Medicine

## 2014-01-15 ENCOUNTER — Ambulatory Visit (HOSPITAL_BASED_OUTPATIENT_CLINIC_OR_DEPARTMENT_OTHER)
Admission: RE | Admit: 2014-01-15 | Discharge: 2014-01-15 | Disposition: A | Discharge status: Home / Self Care | Payer: Medicare PPO | Source: Ambulatory Visit | Attending: Geriatric Medicine | Admitting: Geriatric Medicine

## 2014-01-15 ENCOUNTER — Encounter: Payer: Self-pay | Admitting: Geriatric Medicine

## 2014-01-15 VITALS — BP 130/80 | HR 71 | Temp 97.2°F | Resp 14 | Ht 66.0 in | Wt 177.0 lb

## 2014-01-15 DIAGNOSIS — Z131 Encounter for screening for diabetes mellitus: Secondary | ICD-10-CM

## 2014-01-15 DIAGNOSIS — M949 Disorder of cartilage, unspecified: Secondary | ICD-10-CM

## 2014-01-15 DIAGNOSIS — Z13220 Encounter for screening for lipoid disorders: Secondary | ICD-10-CM

## 2014-01-15 DIAGNOSIS — I10 Essential (primary) hypertension: Secondary | ICD-10-CM | POA: Insufficient documentation

## 2014-01-15 DIAGNOSIS — Z5329 Procedure and treatment not carried out because of patient's decision for other reasons: Secondary | ICD-10-CM

## 2014-01-15 DIAGNOSIS — Z1159 Encounter for screening for other viral diseases: Secondary | ICD-10-CM | POA: Insufficient documentation

## 2014-01-15 DIAGNOSIS — Z0000 Encounter for general adult medical examination without abnormal findings: Secondary | ICD-10-CM | POA: Insufficient documentation

## 2014-01-15 DIAGNOSIS — Z13820 Encounter for screening for osteoporosis: Secondary | ICD-10-CM

## 2014-01-15 DIAGNOSIS — M899 Disorder of bone, unspecified: Secondary | ICD-10-CM

## 2014-01-15 LAB — COMPREHENSIVE METABOLIC PROFILE - BMC/JMC ONLY
ALBUMIN/GLOBULIN RATIO: 1.7
ALBUMIN: 4.3 g/dL (ref 3.2–5.0)
ALKALINE PHOSPHATASE: 69 IU/L (ref 35–120)
ALT (SGPT): 21 IU/L (ref 0–55)
ANION GAP: 7 mmol/L (ref 3–11)
AST (SGOT): 21 IU/L (ref 0–45)
BILIRUBIN, TOTAL: 0.8 mg/dL (ref 0.0–1.3)
BUN: 11 mg/dL (ref 6–22)
CALCIUM: 9.6 mg/dL (ref 8.5–10.5)
CARBON DIOXIDE: 28 mmol/L (ref 22–32)
CHLORIDE: 101 mmol/L (ref 101–111)
CREATININE: 0.87 mg/dL (ref 0.53–1.00)
ESTIMATED GLOMERULAR FILTRATION RATE: >60 mL/min (ref >60)
GLUCOSE: 106 mg/dL (ref 70–110)
POTASSIUM: 3.9 mmol/L (ref 3.5–5.0)
SODIUM: 136 mmol/L (ref 136–145)
TOTAL PROTEIN: 6.8 g/dL (ref 6.0–8.0)

## 2014-01-15 LAB — HCV ANTIBODY SCREEN (IN-HOUSE) - INACTIVE
HCV ANTIBODY: NONREACTIVE
HCV RATIO: 0.07 RATIO (ref 0.00–0.99)

## 2014-01-15 LAB — LIPID PANEL
CHOL/HDL RATIO: 3.2
CHOLESTEROL: 224 mg/dL — ABNORMAL HIGH (ref 120–199)
HDL-CHOLESTEROL: 69 mg/dL (ref >39)
LDL (CALCULATED): 139 mg/dL — ABNORMAL HIGH (ref <130)
TRIGLYCERIDES: 82 mg/dL (ref <150)
VLDL (CALCULATED): 16 mg/dL (ref 5–35)

## 2014-01-15 NOTE — Progress Notes (Signed)
Pt is here today for her annual physical. Eulah Pont, MA

## 2014-01-15 NOTE — Progress Notes (Addendum)
Subjective:     Patient ID:  Carrie Escobar is an 68 y.o. female     Chief Complaint:    Chief Complaint   Patient presents with    Physical       The history is provided by the patient. No language interpreter was used.     WELL WOMAN CPE HPI  Carrie Escobar is a 68 y.o. female here for a comprehensive physical exam. The patient reports no problems    Depression Screen:  1. During the past month, have you often been bothered by feeling down, depressed, or hopeless? no  2. During the past month, have you often been bothered by little interest or pleasure in doing things? no    OB/Gyn History:  Patient receives gynecological care outside our clinic: no If yes,  Date of last pelvic exam: 1999  Date of last mammogram:07/2013  Date of last DEXA scan: > 2 years    Do you take any herbs or supplements that were not prescribed by a doctor? no  Are you taking any calcium supplements? yes  Are you taking aspirin daily?yes  Are you interested in HIV screening? no  Have you had the "Gardasil" cervical cancer vaccination? not applicable    Last Tetanus vaccine: > 10 years  Last Colonoscopy: > 10 years ago  Last Influenza Vaccine: declined  Last Pneumonia vaccine:declined  Last Shingle vaccine: declined    Functional/Nutritional Assessment (see questionnaire completed by nursing and reviewed by provider:yes    Past Medical History  Current Outpatient Prescriptions   Medication Sig    amLODIPine (NORVASC) 5 mg Oral Tablet Take 5 mg by mouth Once a day    aspirin (ECOTRIN) 81 mg Oral Tablet, Delayed Release (E.C.) Take 81 mg by mouth Once a day    Biotin 10 mg Oral Tablet Take 1 Tab by mouth Once a day    bisoprolol-hydrochlorothiazide (ZIAC) 10-6.25 mg Oral Tablet Take 1 Tab by mouth Once a day    calcium citrate-vitamin D3 (CITRACAL) 200 mg calcium -250 unit Oral Tablet Take 1 Tab by mouth Once a day    multivitamin Oral Tablet Take 1 Tab by mouth Once a day     Allergies   Allergen Reactions     Amoxicillin Rash     Past Medical History   Diagnosis Date    Breast mass, right      benign    HTN (hypertension)     Cataract      bilat    Pulmonary emboli      only once in 1999 after her hysterectomy    Family history of colon cancer      Past Surgical History   Procedure Laterality Date    Hx hysterectomy  1999     fibroid    Hx rotator cuff repair  11/04/2013     right shoulder    Hx cataract removal  10/2013     left     Family History   Problem Relation Age of Onset    Stroke Mother     Stroke Father     Cancer Sister      ovaian    Hypertension Mother     Cancer Mother      colon     History     Social History    Marital Status: Divorced     Spouse Name: N/A     Number of Children: 0    Years of Education:  N/A     Occupational History          Social History Main Topics    Smoking status: Never Smoker     Smokeless tobacco: Not on file    Alcohol Use: No    Drug Use: No    Sexual Activity: Not on file     Other Topics Concern    Abuse/Domestic Violence No    Drives Yes    Special Diet No    Uses Cane No    Uses Walker No    Uses Wheelchair No    Right Hand Dominant Yes    Left Hand Dominant No     Social History Narrative    No narrative on file         ROS  Constitutional: Negative.    HENT: Negative.    Eyes: Negative.    Respiratory: Negative.    Cardiovascular: Negative.    Gastrointestinal: Negative.    Genitourinary: Negative.    Musculoskeletal: Negative. Except mild right shpulder pain, 2nd to recent SX   Skin: Negative.    Neurological: Negative.    Endo/Heme/Allergies: Negative.    Psychiatric/Behavioral: Negative.    All other systems reviewed and are negative.  Objective:     BP 130/80   Pulse 71   Temp(Src) 36.2 C (97.2 F) (Tympanic)   Resp 14   Ht 1.676 m (5\' 6" )   Wt 80.287 kg (177 lb)   BMI 28.58 kg/m2   SpO2 98%     Physical Exam  Constitutional: He is oriented to person, place, and time. He appears well-developed and well-nourished.   HENT:   Head:  Normocephalic and atraumatic.   Right Ear: Hearing, tympanic membrane, external ear and ear canal normal.   Left Ear: Hearing, tympanic membrane and external ear normal.   Nose: Nose normal.   Mouth/Throat: Uvula is midline, oropharynx is clear and moist and mucous membranes are normal. No oropharyngeal exudate.   Eyes: Conjunctivae, EOM and lids are normal. No scleral icterus.   Neck: Normal range of motion and full passive range of motion without pain. Neck supple. No JVD present. Carotid bruit is not present. No tracheal deviation present. No mass and no thyromegaly present.   Cardiovascular: Normal rate, regular rhythm, normal heart sounds and intact distal pulses.    Pulm:  Effort normal and breath sounds normal.  Observations:  no respiratory distress and no stridor.  The chest exhibits no tenderness.    Breast exam/pelvic exam:deferred      Abdomen:   Normal appearance and bowel sounds are normal. He exhibits no distension. Soft. There is no hepatosplenomegaly. No tenderness. He has no rigidity, no rebound and no guarding.   Ortho/Musculoskeletal:   Normal range of motion. He exhibits no edema and no tenderness. except slighty decreased right shoulder ROM, she is going to PT  Lymphadenopathy:        Head (right side): No submental, no submandibular, no tonsillar, no preauricular, no posterior auricular and no occipital adenopathy present.        Head (left side): No submental, no submandibular, no tonsillar, no preauricular, no posterior auricular and no occipital adenopathy present.     He has no cervical adenopathy.     He has no axillary adenopathy.        Right: No inguinal and no supraclavicular adenopathy present.        Left: No inguinal and no supraclavicular adenopathy present.   Neurological: He is alert  and oriented to person, place, and time. He has normal strength and normal reflexes. He displays no atrophy, no tremor and normal reflexes. No cranial nerve deficit or sensory deficit. He exhibits  normal muscle tone. He displays a negative Romberg sign. Coordination and gait normal. GCS eye subscore is 4. GCS verbal subscore is 5. GCS motor subscore is 6.   Skin: Skin is warm. No rash noted. No cyanosis or erythema. Nails show no clubbing.   Psychiatric: He has a normal mood and affect. His speech is normal and behavior is normal. Judgment and thought content normal. Cognition and memory are normal.   Nursing note and vitals reviewed.      .    Assessment & Plan:       ICD-9-CM    1. Routine general medical examination at a health care facility V70.0 weight loss, osteoporosis/risk/prevention/thearpy, colon cancer screening, calcium/vitamin D, breast cancer screening, healthy diet and exercise, influnenza vaccination, pneumococcal vaccine (>60 years old or high risk patient), cholesterol, aspirin as primary prevention foe CAD (>45, HTN, DM, Smoker), immunization, safety at work/while driving and Living Will/Advance directive were discussed in details.      HCV ANTIBODY SCREEN (IN-HOUSE) - [facility] ONLY     DEXA BONE DENSITY - AXIAL     COMPREHENSIVE METABOLIC PROFILE - [facility] ONLY     LIPID PANEL   2. Need for hepatitis C screening test V73.89 HCV ANTIBODY SCREEN (IN-HOUSE) - [facility] ONLY   3. Screening for osteoporosis V82.81 DEXA BONE DENSITY - AXIAL   4. Screening, lipid V77.91 LIPID PANEL   5. Disorder of bone and cartilage, unspecified 733.90 DEXA BONE DENSITY - AXIAL   6. Screening for diabetes mellitus V77.1 COMPREHENSIVE METABOLIC PROFILE - [facility] ONLY   7. Unspecified essential hypertension 401.9 COMPREHENSIVE METABOLIC PROFILE - [facility] ONLY   8. Care refused by patient V64.2 She refused FLU/pneumonia/shingle vaccines.           02/04/2014:  Ms. Abbett is scheduled for Cataract EX-IOL, right eye on 02/18/2014 by Dr. Radene Knee.  Pt had annual physical exam on 01/15/14.    Pt is medically cleared for above mentioned cataract procedure.  Kayleen Memos, MD

## 2014-01-15 NOTE — Progress Notes (Signed)
01/15/14 0900   Age 68 and Older   RIGHT EYE Pass   Right Eye Reading 20/20   Right Eye Corrected yes   LEFT EYE Pass   Left Eye Reading 20/20   Left Eye Corrected yes   Both/Combined Pass   Reading 20/13   Corrected yes   Eulah Pont, MA

## 2014-01-21 NOTE — Patient Instructions (Signed)
Fat and Cholesterol Control Diet  Fat and cholesterol levels in your blood and organs are influenced by your diet. High levels of fat and cholesterol may lead to diseases of the heart, small and large blood vessels, gallbladder, liver, and pancreas.  CONTROLLING FAT AND CHOLESTEROL WITH DIET  Although exercise and lifestyle factors are important, your diet is key. That is because certain foods are known to raise cholesterol and others to lower it. The goal is to balance foods for their effect on cholesterol and more importantly, to replace saturated and trans fat with other types of fat, such as monounsaturated fat, polyunsaturated fat, and omega-3 fatty acids.  On average, a person should consume no more than 15 to 17 g of saturated fat daily. Saturated and trans fats are considered "bad" fats, and they will raise LDL cholesterol. Saturated fats are primarily found in animal products such as meats, butter, and cream. However, that does not mean you need to give up all your favorite foods. Today, there are good tasting, low-fat, low-cholesterol substitutes for most of the things you like to eat. Choose low-fat or nonfat alternatives. Choose round or loin cuts of red meat. These types of cuts are lowest in fat and cholesterol. Chicken (without the skin), fish, veal, and ground turkey breast are great choices. Eliminate fatty meats, such as hot dogs and salami. Even shellfish have little or no saturated fat. Have a 3 oz (85 g) portion when you eat lean meat, poultry, or fish.  Trans fats are also called "partially hydrogenated oils." They are oils that have been scientifically manipulated so that they are solid at room temperature resulting in a longer shelf life and improved taste and texture of foods in which they are added. Trans fats are found in stick margarine, some tub margarines, cookies, crackers, and baked goods.   When baking and cooking, oils are a great substitute for butter. The monounsaturated oils are  especially beneficial since it is believed they lower LDL and raise HDL. The oils you should avoid entirely are saturated tropical oils, such as coconut and palm.   Remember to eat a lot from food groups that are naturally free of saturated and trans fat, including fish, fruit, vegetables, beans, grains (barley, rice, couscous, bulgur wheat), and pasta (without cream sauces).   IDENTIFYING FOODS THAT LOWER FAT AND CHOLESTEROL   Soluble fiber may lower your cholesterol. This type of fiber is found in fruits such as apples, vegetables such as broccoli, potatoes, and carrots, legumes such as beans, peas, and lentils, and grains such as barley. Foods fortified with plant sterols (phytosterol) may also lower cholesterol. You should eat at least 2 g per day of these foods for a cholesterol lowering effect.   Read package labels to identify low-saturated fats, trans fat free, and low-fat foods at the supermarket. Select cheeses that have only 2 to 3 g saturated fat per ounce. Use a heart-healthy tub margarine that is free of trans fats or partially hydrogenated oil. When buying baked goods (cookies, crackers), avoid partially hydrogenated oils. Breads and muffins should be made from whole grains (whole-wheat or whole oat flour, instead of "flour" or "enriched flour"). Buy non-creamy canned soups with reduced salt and no added fats.   FOOD PREPARATION TECHNIQUES   Never deep-fry. If you must fry, either stir-fry, which uses very little fat, or use non-stick cooking sprays. When possible, broil, bake, or roast meats, and steam vegetables. Instead of putting butter or margarine on vegetables, use lemon   and herbs, applesauce, and cinnamon (for squash and sweet potatoes). Use nonfat yogurt, salsa, and low-fat dressings for salads.   LOW-SATURATED FAT / LOW-FAT FOOD SUBSTITUTES  Meats / Saturated Fat (g)  · Avoid: Steak, marbled (3 oz/85 g) / 11 g  · Choose: Steak, lean (3 oz/85 g) / 4 g  · Avoid: Hamburger (3 oz/85 g) / 7  g  · Choose: Hamburger, lean (3 oz/85 g) / 5 g  · Avoid: Ham (3 oz/85 g) / 6 g  · Choose: Ham, lean cut (3 oz/85 g) / 2.4 g  · Avoid: Chicken, with skin, dark meat (3 oz/85 g) / 4 g  · Choose: Chicken, skin removed, dark meat (3 oz/85 g) / 2 g  · Avoid: Chicken, with skin, light meat (3 oz/85 g) / 2.5 g  · Choose: Chicken, skin removed, light meat (3 oz/85 g) / 1 g  Dairy / Saturated Fat (g)  · Avoid: Whole milk (1 cup) / 5 g  · Choose: Low-fat milk, 2% (1 cup) / 3 g  · Choose: Low-fat milk, 1% (1 cup) / 1.5 g  · Choose: Skim milk (1 cup) / 0.3 g  · Avoid: Hard cheese (1 oz/28 g) / 6 g  · Choose: Skim milk cheese (1 oz/28 g) / 2 to 3 g  · Avoid: Cottage cheese, 4% fat (1 cup) / 6.5 g  · Choose: Low-fat cottage cheese, 1% fat (1 cup) / 1.5 g  · Avoid: Ice cream (1 cup) / 9 g  · Choose: Sherbet (1 cup) / 2.5 g  · Choose: Nonfat frozen yogurt (1 cup) / 0.3 g  · Choose: Frozen fruit bar / trace  · Avoid: Whipped cream (1 tbs) / 3.5 g  · Choose: Nondairy whipped topping (1 tbs) / 1 g  Condiments / Saturated Fat (g)  · Avoid: Mayonnaise (1 tbs) / 2 g  · Choose: Low-fat mayonnaise (1 tbs) / 1 g  · Avoid: Butter (1 tbs) / 7 g  · Choose: Extra light margarine (1 tbs) / 1 g  · Avoid: Coconut oil (1 tbs) / 11.8 g  · Choose: Olive oil (1 tbs) / 1.8 g  · Choose: Corn oil (1 tbs) / 1.7 g  · Choose: Safflower oil (1 tbs) / 1.2 g  · Choose: Sunflower oil (1 tbs) / 1.4 g  · Choose: Soybean oil (1 tbs) / 2.4 g  · Choose: Canola oil (1 tbs) / 1 g  Document Released: 09/26/2005 Document Revised: 01/21/2013 Document Reviewed: 03/17/2011  [employer]® Patient Information ©2015 [employer]. This information is not intended to replace advice given to you by your health care provider. Make sure you discuss any questions you have with your health care provider.

## 2014-01-23 ENCOUNTER — Ambulatory Visit
Admission: RE | Admit: 2014-01-23 | Discharge: 2014-01-23 | Disposition: A | Discharge status: Home / Self Care | Payer: Medicare PPO | Source: Ambulatory Visit | Attending: Geriatric Medicine | Admitting: Geriatric Medicine

## 2014-01-23 DIAGNOSIS — Z0000 Encounter for general adult medical examination without abnormal findings: Secondary | ICD-10-CM

## 2014-01-23 DIAGNOSIS — Z13820 Encounter for screening for osteoporosis: Secondary | ICD-10-CM | POA: Insufficient documentation

## 2014-01-23 DIAGNOSIS — M899 Disorder of bone, unspecified: Secondary | ICD-10-CM | POA: Insufficient documentation

## 2014-01-23 DIAGNOSIS — M949 Disorder of cartilage, unspecified: Secondary | ICD-10-CM | POA: Insufficient documentation

## 2014-02-13 ENCOUNTER — Telehealth: Payer: Self-pay | Admitting: Geriatric Medicine

## 2014-02-13 NOTE — Telephone Encounter (Signed)
Spoke with the office that is doing her cataract surgery, they were okay with [place] sending his last physical addendum. Sent twice to assure that they got it and they will contact me if they need anything else. Starr Sinclair

## 2014-02-13 NOTE — Telephone Encounter (Signed)
-----   Message from Kayleen Memos, MD sent at 02/12/2014  8:41 PM EDT -----  I think I gave it to Amber, I add addendum to her physical , she is clear for surgery, use my addendum note for her pre op.  ----- Message -----     From: Starr Sinclair     Sent: 02/12/2014  10:44 AM       To: Kayleen Memos, MD    I spoke with you i believe last week and i told you that she had cataract surgery coming up on the 12th. She had just had a physical with [place] on the 8th of April, and wants to know if we can just use that and add an addendum to it. Yuo hadnt given me a clear answer. Can we use her physical? I gave you the form that they sent for her pre op and i dont believe you gave it back.

## 2014-04-04 ENCOUNTER — Ambulatory Visit (INDEPENDENT_AMBULATORY_CARE_PROVIDER_SITE_OTHER): Payer: No Typology Code available for payment source | Admitting: Family Medicine

## 2014-04-04 ENCOUNTER — Encounter (INDEPENDENT_AMBULATORY_CARE_PROVIDER_SITE_OTHER): Payer: Self-pay

## 2014-04-04 VITALS — BP 168/73 | HR 65 | Temp 98.3°F | Resp 18 | Ht 65.5 in | Wt 177.0 lb

## 2014-04-04 DIAGNOSIS — R600 Localized edema: Secondary | ICD-10-CM

## 2014-04-04 DIAGNOSIS — R609 Edema, unspecified: Secondary | ICD-10-CM

## 2014-04-04 LAB — POCT CHEM8
Anion Gap, VH POCT: 8 mmol/L — AB (ref 10–20)
BUN, VH POCT: 10 mg/dL (ref 6–20)
Chloride, VH POCT: 108 mmol/L (ref 98–112)
Creatinine, VH POCT: 1.2 mg/dL — AB (ref 0.6–1.1)
Glucose, VH POCT: 127 mg/dL — AB (ref 70–99)
Ionized Calcium, VH POCT: 4.3 mg/dL — AB (ref 4.35–5.1)
Potassium, VH POCT: 3.5 mmol/L (ref 3.5–5.3)
Sodium, VH POCT: 136 mmol/L (ref 135–145)
Total Co2, VH POCT: 25 mmol/L (ref 24–29)

## 2014-04-04 MED ORDER — POTASSIUM CHLORIDE CRYS ER 20 MEQ PO TBCR
20.0000 meq | EXTENDED_RELEASE_TABLET | Freq: Every day | ORAL | Status: DC
Start: 2014-04-04 — End: 2015-03-11

## 2014-04-04 MED ORDER — FUROSEMIDE 20 MG PO TABS
20.0000 mg | ORAL_TABLET | Freq: Every day | ORAL | Status: DC
Start: 2014-04-04 — End: 2015-03-11

## 2014-04-04 NOTE — Patient Instructions (Addendum)
Leg Swelling [Bilateral]    Swelling of the feet, ankles and legs is called "Edema." It is due to excess fluid collecting in the tissues. Because of gravity, excess fluid in the body settles in the lowest part. This is why the legs and feet are most affected.  Some of the causes for edema include:   Disease of the heart (congestive heart failure or "CHF")   Prolonged standing or sitting (with the legs in the down position)   Infection of the feet or legs   Venous Insufficiency (congestion of blood in the veins of the legs)   Varicose veins (dilated veins of the lower leg)   Garters, or clothing that constricts your legs. (These will cause venous congestion by restricting blood flow.)   Some medicines (hormones such as birth control pills; some blood pressure medicines, such as calcium channel blockers; steroids; some antidepressants such as MAO inhibitors and tricyclics.)   Menstrual periods with fluid retention   Renal insufficiency (a form of kidney disease)   Liver failure   Pregnancy (Some swelling is normal, but a sudden increase in leg swelling or weight gain can be a sign of a dangerous complication of pregnancy).  Medical treatment will depend on the cause of your swelling. Diuretics (water pills) may be prescribed to remove excess fluid.  Home Care:   Do not wear garments that constrict your legs (such as garters).   Elevate your legs while lying or sitting.   If infection, injury or recent surgery is the cause for your swelling, stay off your legs as much as possible until symptoms improve.   If your doctor says that your leg swelling is caused by venous insufficiency or varicose veins, do not sit or stand in one place for long periods of time. Take breaks and walk about every few hours. Brisk walking is a good exercise and helps circulate the congested blood from your leg. Talk to your doctor about the use of support stockings to prevent daytime leg swelling.   If your doctor says  that heart disease is the cause of your leg swelling, follow a low-salt diet to prevent excess fluid retention.  Follow Up  with your doctor or as advised by our staff.  Get Prompt Medical Attention  if any of the following occur:   New or worsening shortness of breath or chest pain   Increasing swelling in both legs or ankles   Swelling of the abdomen   Redness, warmth or swelling in one leg   Fever of 100.4F (38C) or higher, or as directed by your healthcare provider   Yellow color to the skin or eyes   Rapid, unexplained weight gain   2000-2014 [employer], [address], [place] [address]. All rights reserved. This information is not intended as a substitute for professional medical care. Always follow your healthcare professional's instructions.

## 2014-04-04 NOTE — Progress Notes (Signed)
Subjective:    Patient ID: Amy Christensen is a 68 y.o. female.    HPI  Pt presents with c/o bilat LE swelling X one wk. Gradually worsening. Worse at night after being on feet all day. Recently started back to work. Has tried elevating her legs at night with some temporary improvement. Denies fever/chills, N/V, chest pain, sob, fatigue, numbness/tingling. Has h/o high BP reports last physical with PCP in 02/2014 BP was ok and no leg swelling. Tries to watch her salt intake.     The following portions of the patient's history were reviewed and updated as appropriate: allergies, current medications, past medical history, past social history, past surgical history and problem list.    Review of Systems   Cardiovascular: Positive for leg swelling.   All other systems reviewed and are negative.        Objective:    BP 168/73 mmHg  Pulse 65  Temp(Src) 98.3 F (36.8 C) (Oral)  Resp 18  Ht 1.664 m (5' 5.5")  Wt 80.287 kg (177 lb)  BMI 29.00 kg/m2    BP elevated- discussed with patient and advised f/u with PCP. Advised pt that elevated BP can contribute to her edema.    Physical Exam   Constitutional: She is oriented to person, place, and time. She appears well-developed and well-nourished. No distress.   HENT:   Head: Normocephalic and atraumatic.   Eyes: EOM are normal.   Neck: Normal range of motion.   Cardiovascular: Normal rate, regular rhythm and intact distal pulses.    Pulmonary/Chest: Effort normal and breath sounds normal. No respiratory distress. She has no wheezes. She has no rales.   Musculoskeletal: Normal range of motion. She exhibits edema.   bilat +2 pitting pedal edema noted. TTP.    Neurological: She is alert and oriented to person, place, and time.   Skin: Skin is warm and dry. She is not diaphoretic.   Psychiatric: She has a normal mood and affect.   Nursing note and vitals reviewed.    Lab Results from today's visit:  Recent Results (from the past 4 hour(s))   [facility] POCT I-STAT CHEM8    Collection  Time: 04/04/14  6:55 PM   Result Value Ref Range    Sodium, [facility] POCT 136 135 - 145 mmol/L    Potassium, [facility] POCT 3.5 3.5 - 5.3 mmol/L    Chloride, [facility] POCT 108 98 - 112 mmol/L    Ionized Calcium, [facility] POCT 4.3 (A) 4.35 - 5.1 mg/dL    Total Co2, [facility] POCT 25 24 - 29 mmol/L    Glucose, [facility] POCT 127 (A) 70 - 99 mg/dL    BUN, [facility] POCT 10 6 - 20 mg/dL    Creatinine, [facility] POCT 1.2 (A) 0.6 - 1.1 mg/dL    Anion Gap, [facility] POCT 8 (A) 10 - 20 mmol/L       Radiology Results from today's visit:  No results found.        Assessment and Plan:       Amy Christensen was seen today for leg swelling.    Diagnoses and associated orders for this visit:    Pedal edema  - [facility] POCT I-STAT CHEM8- K and creat wnl  - furosemide (LASIX) 20 MG tablet; Take 1 tablet (20 mg total) by mouth daily. (7 days)   - potassium chloride (K-DUR,KLOR-CON) 20 MEQ tablet; Take 1 tablet (20 mEq total) by mouth daily. (7 days)   Elevate legs when possible; compression stockings,  limit salt intake  Advised follow up with PCP in 2-3 days for further evaluation and treatment of BP and edema  Follow up with PCP or RTC if symptoms worsen or do not improve in the next several days.  Patient/guardian expressed understanding and agreement with plan of care at time of discharge.          Isaiah Blakes, MD  [facility]  04/04/2014  7:09 PM

## 2014-04-04 NOTE — Progress Notes (Signed)
Date Specimen Drawn: 04/04/2014  Time Specimen Drawn: 1855  Test(s) Ordered:  iStat  Patient's Tolerance: Good  Location Specimen Drawn: Left antecubital  1st attempt successful. Pt tolerated procedure well.    Andrews Tener D Ahmiyah Coil  7:08 PM

## 2014-04-07 ENCOUNTER — Telehealth (INDEPENDENT_AMBULATORY_CARE_PROVIDER_SITE_OTHER): Payer: Self-pay

## 2014-04-07 NOTE — Telephone Encounter (Signed)
Placed follow up call, no answer so I left a message stating to return our phone call if she had any questions or concerns regarding her visit with us.

## 2014-04-08 ENCOUNTER — Encounter: Payer: Self-pay | Admitting: Geriatric Medicine

## 2014-04-08 ENCOUNTER — Ambulatory Visit: Payer: Medicare PPO | Admitting: Geriatric Medicine

## 2014-04-08 VITALS — BP 128/70 | HR 56 | Temp 97.5°F | Resp 12 | Ht 65.5 in | Wt 173.1 lb

## 2014-04-08 DIAGNOSIS — R609 Edema, unspecified: Secondary | ICD-10-CM | POA: Insufficient documentation

## 2014-04-08 NOTE — Progress Notes (Signed)
Pt here for an urgent care follow up for swelling in her lower legs. Carrie Escobar, [place]

## 2014-04-08 NOTE — Progress Notes (Signed)
Subjective:     Patient ID:  Carrie Escobar is an 68 y.o. female     Chief Complaint:    Chief Complaint   Patient presents with    ED Follow-up       The history is provided by the patient and medical records. No language interpreter was used.     She is here for bilat leg edema, she was seen at urgent care 4 days ago for bilat lower leg edema. After  A long time she returned to work, she had to stand for long hours.  Her BMP was normal with K 3.5, Lasix 20 mg was given she has improved now. No CP/SOB/dizziness. No rash  Past Medical History  Current Outpatient Prescriptions   Medication Sig    amLODIPine (NORVASC) 5 mg Oral Tablet Take 5 mg by mouth Once a day    aspirin (ECOTRIN) 81 mg Oral Tablet, Delayed Release (E.C.) Take 81 mg by mouth Once a day    Biotin 10 mg Oral Tablet Take 1 Tab by mouth Once a day    bisoprolol-hydrochlorothiazide (ZIAC) 10-6.25 mg Oral Tablet Take 1 Tab by mouth Once a day    calcium citrate-vitamin D3 (CITRACAL) 200 mg calcium -250 unit Oral Tablet Take 1 Tab by mouth Once a day    furosemide (LASIX) 20 mg Oral Tablet Take 20 mg by mouth Once a day    multivitamin Oral Tablet Take 1 Tab by mouth Once a day    potassium chloride (K-DUR) 20 mEq Oral Tab Sust.Rel. Particle/Crystal Take 20 mEq by mouth Once a day     Allergies   Allergen Reactions    Amoxicillin Rash     Past Medical History   Diagnosis Date    Breast mass, right      benign    HTN (hypertension)     Cataract      bilat    Pulmonary emboli      only once in 1999 after her hysterectomy    Family history of colon cancer      Past Surgical History   Procedure Laterality Date    Hx hysterectomy  1999     fibroid    Hx rotator cuff repair  11/04/2013     right shoulder    Hx cataract removal  10/2013     left     Family History   Problem Relation Age of Onset    Stroke Mother     Stroke Father     Cancer Sister      ovaian    Hypertension Mother     Cancer Mother      colon     History          Social History    Marital Status: Divorced     Spouse Name: N/A     Number of Children: 0    Years of Education: N/A     Occupational History          Social History Main Topics    Smoking status: Never Smoker     Smokeless tobacco: Never Used    Alcohol Use: No    Drug Use: No    Sexual Activity: Not on file     Other Topics Concern    Abuse/Domestic Violence No    Drives Yes    Special Diet No    Uses Cane No    Uses Walker No    Uses Wheelchair No  Right Hand Dominant Yes    Left Hand Dominant No     Social History Narrative         Review of Systems   Constitutional: Negative.    HENT: Negative.    Respiratory: Negative.    Cardiovascular: Positive for leg swelling. Negative for chest pain and palpitations.   Gastrointestinal: Negative.    Genitourinary: Negative.    Skin: Negative.    Hematological: Negative.        Objective:     BP 128/70    Pulse 56    Temp(Src) 36.4 C (97.5 F) (Tympanic)    Resp 12    Ht 1.664 m (5' 5.5")    Wt 78.518 kg (173 lb 1.6 oz)    BMI 28.36 kg/m2      SpO2 96%      Physical Exam   Constitutional: She is oriented to person, place, and time. She appears well-developed and well-nourished.   HENT:   Head: Normocephalic and atraumatic.   Right Ear: External ear normal.   Left Ear: External ear normal.   Eyes: No scleral icterus.   Cardiovascular: Normal rate, regular rhythm, normal heart sounds and intact distal pulses.    Pulmonary/Chest: Effort normal and breath sounds normal.   Musculoskeletal: She exhibits no edema.   Neurological: She is alert and oriented to person, place, and time. Coordination normal.   Nursing note and vitals reviewed.          Ortho Exam    Assessment & Plan:       ICD-9-CM   1. Dependent edema 782.3     Leg elevation while rest  Compression stocking  Lasix PRN

## 2014-07-15 ENCOUNTER — Ambulatory Visit: Payer: Self-pay | Admitting: Internal Medicine

## 2014-07-15 ENCOUNTER — Other Ambulatory Visit: Payer: Self-pay | Admitting: Geriatric Medicine

## 2014-07-25 ENCOUNTER — Other Ambulatory Visit: Payer: Self-pay | Admitting: Geriatric Medicine

## 2014-07-25 ENCOUNTER — Telehealth: Payer: Self-pay | Admitting: Geriatric Medicine

## 2014-07-25 DIAGNOSIS — Z9889 Other specified postprocedural states: Secondary | ICD-10-CM

## 2014-07-25 DIAGNOSIS — Z1239 Encounter for other screening for malignant neoplasm of breast: Secondary | ICD-10-CM

## 2014-07-25 NOTE — Telephone Encounter (Signed)
-----   Message from Kayleen Memos, MD sent at 07/25/2014  2:56 PM EDT -----  It was ordered  ----- Message -----     From: Starr Sinclair     Sent: 07/24/2014  11:09 AM       To: Kayleen Memos, MD    Pt wants a mammogram done

## 2014-07-25 NOTE — Telephone Encounter (Signed)
Will schedule and then call pt. Carrie Escobar

## 2014-07-25 NOTE — Telephone Encounter (Signed)
-----   Message from Kayleen Memos, MD sent at 07/25/2014 12:40 AM EDT -----  ok  ----- Message -----     From: Starr Sinclair     Sent: 07/24/2014  11:09 AM       To: Kayleen Memos, MD    Pt wants a mammogram done

## 2014-08-12 ENCOUNTER — Ambulatory Visit (HOSPITAL_BASED_OUTPATIENT_CLINIC_OR_DEPARTMENT_OTHER)
Admission: RE | Admit: 2014-08-12 | Discharge: 2014-08-12 | Disposition: A | Discharge status: Home / Self Care | Payer: Medicare PPO | Source: Ambulatory Visit | Attending: Geriatric Medicine | Admitting: Geriatric Medicine

## 2014-08-21 ENCOUNTER — Other Ambulatory Visit: Payer: Self-pay | Admitting: Geriatric Medicine

## 2014-08-21 ENCOUNTER — Telehealth: Payer: Self-pay | Admitting: Geriatric Medicine

## 2014-08-21 DIAGNOSIS — Z1211 Encounter for screening for malignant neoplasm of colon: Secondary | ICD-10-CM

## 2014-08-21 NOTE — Telephone Encounter (Signed)
Referral in system, will send. Carrie Escobar

## 2014-08-21 NOTE — Telephone Encounter (Signed)
-----   Message from Kayleen Memos, MD sent at 08/20/2014  9:43 PM EST -----  Contact: [phone]  [place] refer to Dr. Lovett Calender for colon cancer screening  ----- Message -----     From: Starr Sinclair     Sent: 08/20/2014   1:32 PM       To: Kayleen Memos, MD    Wants you to set her up with a colonoscopy

## 2014-08-29 ENCOUNTER — Ambulatory Visit (HOSPITAL_BASED_OUTPATIENT_CLINIC_OR_DEPARTMENT_OTHER)
Admission: RE | Admit: 2014-08-29 | Discharge: 2014-08-29 | Disposition: A | Discharge status: Home / Self Care | Payer: Medicare PPO | Source: Ambulatory Visit | Attending: Geriatric Medicine | Admitting: Geriatric Medicine

## 2014-08-29 DIAGNOSIS — Z9889 Other specified postprocedural states: Secondary | ICD-10-CM | POA: Insufficient documentation

## 2014-08-29 DIAGNOSIS — Z86018 Personal history of other benign neoplasm: Secondary | ICD-10-CM | POA: Insufficient documentation

## 2014-09-03 NOTE — Progress Notes (Signed)
Left message to cb. Lucille Passy, [place]

## 2014-10-10 HISTORY — PX: COLONOSCOPY: WVUENDOPRO10

## 2014-10-13 ENCOUNTER — Other Ambulatory Visit: Payer: Self-pay | Admitting: Geriatric Medicine

## 2014-10-14 NOTE — Telephone Encounter (Signed)
Pt aware and appointment scheduled for 10/20/14 @ 1 pm. Lucille Passy, [place]

## 2014-10-20 ENCOUNTER — Ambulatory Visit: Payer: Medicare PPO | Admitting: Geriatric Medicine

## 2014-10-20 ENCOUNTER — Encounter: Payer: Self-pay | Admitting: Geriatric Medicine

## 2014-10-20 VITALS — BP 126/80 | HR 64 | Temp 97.3°F | Resp 12 | Ht 66.5 in | Wt 166.4 lb

## 2014-10-20 DIAGNOSIS — I10 Essential (primary) hypertension: Secondary | ICD-10-CM

## 2014-10-20 DIAGNOSIS — E7800 Pure hypercholesterolemia, unspecified: Secondary | ICD-10-CM

## 2014-10-20 DIAGNOSIS — Z79899 Other long term (current) drug therapy: Secondary | ICD-10-CM

## 2014-10-20 MED ORDER — BISOPROLOL 10 MG-HYDROCHLOROTHIAZIDE 6.25 MG TABLET
ORAL_TABLET | ORAL | Status: DC
Start: 2014-10-20 — End: 2015-05-08

## 2014-10-20 MED ORDER — AMLODIPINE 5 MG TABLET
5.0000 mg | ORAL_TABLET | Freq: Every day | ORAL | Status: DC
Start: 2014-10-20 — End: 2015-05-02

## 2014-10-20 NOTE — Progress Notes (Signed)
Pt here to follow up on her blood pressure. She states that she is feeling "good". Lucille Passy, [place]

## 2014-10-20 NOTE — Progress Notes (Signed)
Subjective:     Patient ID:  Carrie Escobar is an 69 y.o. female     Chief Complaint:    Chief Complaint   Patient presents with    Hypertension       The history is provided by the patient and medical records. No language interpreter was used.       Carrie Escobar is a 69 y.o. female with hypertension.    Hypertension ROS: taking medications as instructed, no medication side effects noted, patient does not perform home BP monitoring, no TIAs, no chest pain on exertion, no dyspnea on exertion, no swelling of ankles, no orthostatic dizziness or lightheadedness, no orthopnea or paroxysmal nocturnal dyspnea, no palpitations, no intermittent claudication symptoms.   Comprehensive Metabolic Profile    Lab Results   Component Value Date/Time    SODIUM 136 01/15/2014 10:39 AM    POTASSIUM 3.9 01/15/2014 10:39 AM    CHLORIDE 101 01/15/2014 10:39 AM    CO2 28 01/15/2014 10:39 AM    ANIONGAP 7 01/15/2014 10:39 AM    BUN 11 01/15/2014 10:39 AM    CREATININE 0.87 01/15/2014 10:39 AM    Lab Results   Component Value Date/Time    CALCIUM 9.6 01/15/2014 10:39 AM    TOTALPROTEIN 6.8 01/15/2014 10:39 AM    AST 21 01/15/2014 10:39 AM    ALT 21 01/15/2014 10:39 AM        Lab Results   Component Value Date    CHOLESTEROL 224* 01/15/2014    HDLCHOL 69 01/15/2014    LDLCHOL 139* 01/15/2014    TRIG 82 01/15/2014      She has never taken any statin,       Past Medical History  Current Outpatient Prescriptions   Medication Sig    amLODIPine (NORVASC) 5 mg Oral Tablet Take 5 mg by mouth Once a day    aspirin (ECOTRIN) 81 mg Oral Tablet, Delayed Release (E.C.) Take 81 mg by mouth Once a day    Biotin 10 mg Oral Tablet Take 1 Tab by mouth Once a day    bisoprolol-hydrochlorothiazide (ZIAC) 10-6.25 mg Oral Tablet TAKE 1 TABLET DAILY    calcium citrate-vitamin D3 (CITRACAL) 200 mg calcium -250 unit Oral Tablet Take 1 Tab by mouth Once a day    multivitamin Oral Tablet Take 1 Tab by mouth Once a day     Allergies   Allergen  Reactions    Amoxicillin Rash     Past Medical History   Diagnosis Date    Breast mass, right      benign    HTN (hypertension)     Cataract      bilat    Pulmonary emboli      only once in 1999 after her hysterectomy    Family history of colon cancer          Past Surgical History   Procedure Laterality Date    Hx hysterectomy  1999     fibroid    Hx rotator cuff repair  11/04/2013     right shoulder    Hx cataract removal  10/2013     left         Family History   Problem Relation Age of Onset    Stroke Mother     Stroke Father     Cancer Sister      ovaian    Hypertension Mother     Cancer Mother      colon  History     Social History    Marital Status: Divorced     Spouse Name: N/A     Number of Children: 0    Years of Education: N/A     Occupational History          Social History Main Topics    Smoking status: Never Smoker     Smokeless tobacco: Never Used    Alcohol Use: No    Drug Use: No    Sexual Activity: Not on file     Other Topics Concern    Abuse/Domestic Violence No    Drives Yes    Special Diet No    Uses Cane No    Uses Walker No    Uses Wheelchair No    Right Hand Dominant Yes    Left Hand Dominant No     Social History Narrative         Review of Systems   Constitutional: Negative.    HENT: Negative.    Respiratory: Negative.    Cardiovascular: Negative.    Gastrointestinal: Negative.    Skin: Negative.    Neurological: Negative.    Hematological: Negative.        Objective:     BP 126/80 mmHg   Pulse 64   Temp(Src) 36.3 C (97.3 F) (Tympanic)   Resp 12   Ht 1.689 m (5' 6.5")   Wt 75.479 kg (166 lb 6.4 oz)   BMI 26.46 kg/m2   SpO2 99%     Physical Exam  Appearance healthy, alert and cooperative.  General exam BP noted to be well controlled today in office, S1, S2 normal, no gallop, no murmur, chest clear, no JVD, no HSM, no edema  Neck: no carotid bruit, no thyromegaly, no thyroid mass, supple,   Abd: +BS, ND/NT, no Organomegaly, no abd bruit, no  mass/hernia  Gait: WNL    Ortho Exam    Assessment & Plan:       ICD-10-CM    1. Essential hypertension I10 Assessment:    Hypertension well controlled, no significant medication side effects noted, needs to follow diet more regularly.     Plan:   current treatment plan is effective, no change in therapy, lab results and schedule of future lab studies reviewed with patient, repeat labs ordered prior to next appointment, orders and follow up as documented in [place], reviewed diet, exercise and weight control, recommended sodium restriction, cardiovascular risk and specific lipid/LDL goals reviewed, reviewed medications and side effects in detail, use of aspirin to prevent MI and TIAs discussed.    amLODIPine (NORVASC) 5 mg Oral Tablet     bisoprolol-hydrochlorothiazide (ZIAC) 10-6.25 mg Oral Tablet     COMPREHENSIVE METABOLIC PROFILE - [facility] ONLY   2. 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 Medication management Z79.899 COMPREHENSIVE METABOLIC PROFILE - [facility] ONLY     RTC in 6 months  If lipid still abnormal may try statin

## 2014-12-04 ENCOUNTER — Ambulatory Visit: Payer: Medicare PPO | Admitting: Geriatric Medicine

## 2014-12-04 ENCOUNTER — Ambulatory Visit (HOSPITAL_BASED_OUTPATIENT_CLINIC_OR_DEPARTMENT_OTHER)
Admission: RE | Admit: 2014-12-04 | Discharge: 2014-12-04 | Disposition: A | Discharge status: Home / Self Care | Payer: Medicare PPO | Source: Ambulatory Visit | Attending: Geriatric Medicine | Admitting: Geriatric Medicine

## 2014-12-04 ENCOUNTER — Encounter: Payer: Self-pay | Admitting: Geriatric Medicine

## 2014-12-04 VITALS — BP 118/74 | HR 87 | Temp 96.9°F | Resp 12 | Ht 65.5 in | Wt 167.0 lb

## 2014-12-04 DIAGNOSIS — R0789 Other chest pain: Secondary | ICD-10-CM | POA: Insufficient documentation

## 2014-12-04 DIAGNOSIS — R001 Bradycardia, unspecified: Secondary | ICD-10-CM | POA: Insufficient documentation

## 2014-12-04 NOTE — Progress Notes (Signed)
Subjective:     Patient ID:  Carrie Escobar is an 69 y.o. female     Chief Complaint:    Chief Complaint   Patient presents with    Chest Tightness       Patient is a 69 y.o. female presenting with chest pain. The history is provided by the patient and medical records. No language interpreter was used.   Chest Pain    This is a chronic problem. Episode onset: Patient is here for follow up from Dr Luiz Ochoa, she has a chest discomfort in the middle sternum, that doesn't happen all the time but over the past month. The pain does not radiates it is stationary. She takes Maalox and this helps with the chest discomfor. The onset quality is gradual. The problem occurs intermittently. The problem has been waxing and waning. The pain is present in the substernal region. The pain is mild. The quality of the pain is described as sharp and stabbing. The pain does not radiate. Pertinent negatives include no abdominal pain, back pain, claudication, cough, diaphoresis, dizziness, exertional chest pressure, fever, headaches, hemoptysis, irregular heartbeat, leg pain, lower extremity edema, malaise/fatigue, nausea, near-syncope, numbness, orthopnea, palpitations, PND, shortness of breath, sputum production, syncope, vomiting or weakness. The pain is aggravated by nothing. She has tried antacids for the symptoms. The treatment provided significant relief. Risk factors include post-menopausal.   Her past medical history is significant for hypertension and PE (in 1999 and [facility]).   Pertinent negatives for past medical history include no aneurysm, no anxiety/panic attacks, no aortic aneurysm, no aortic dissection, no arrhythmia, no bicuspid aortic valve, no CAD, no cancer, no congenital heart disease, no connective tissue disease, no COPD, no CHF, no diabetes, no DVT, no hyperhomocysteinemia, no hyperlipidemia, no Kawasaki disease, no Marfan's syndrome, no MI, no mitral valve prolapse, no pacemaker, no PVD, no recent injury, no  rheumatic fever, no seizures, no sickle cell disease, no sleep apnea, no spontaneous pneumothorax, no stimulant use, no strokes, no thyroid problem, no TIA, Turner syndrome and no valve disorder. Prior workup: she was seen by Dr. Luiz Ochoa, schrdule for scope on 01/2015.     She participate in [place] classes several times /week w/o any chest pain/SOB/dizziness.     Past Medical History  Current Outpatient Prescriptions   Medication Sig    amLODIPine (NORVASC) 5 mg Oral Tablet Take 1 Tab (5 mg total) by mouth Once a day    aspirin (ECOTRIN) 81 mg Oral Tablet, Delayed Release (E.C.) Take 81 mg by mouth Once a day    Biotin 10 mg Oral Tablet Take 1 Tab by mouth Once a day    bisoprolol-hydrochlorothiazide (ZIAC) 10-6.25 mg Oral Tablet TAKE 1 TABLET DAILY    calcium citrate-vitamin D3 (CITRACAL) 200 mg calcium -250 unit Oral Tablet Take 1 Tab by mouth Once a day    multivitamin Oral Tablet Take 1 Tab by mouth Once a day    timolol maleate (TIMOPTIC) 0.25 % Ophthalmic Drops Instill 1 Drop into right eye Twice daily     Allergies   Allergen Reactions    Amoxicillin Rash     Past Medical History   Diagnosis Date    Breast mass, right      benign    HTN (hypertension)     Cataract      bilat    Pulmonary emboli      only once in 1999 after her hysterectomy    Family history of colon cancer  Past Surgical History   Procedure Laterality Date    Hx hysterectomy  1999     fibroid    Hx rotator cuff repair  11/04/2013     right shoulder    Hx cataract removal  10/2013     left         Family History   Problem Relation Age of Onset    Stroke Mother     Stroke Father     Cancer Sister      ovaian    Hypertension Mother     Cancer Mother      colon         History     Social History    Marital Status: Divorced     Spouse Name: N/A     Number of Children: 0    Years of Education: N/A     Occupational History          Social History Main Topics    Smoking status: Never Smoker     Smokeless tobacco:  Never Used    Alcohol Use: No    Drug Use: No    Sexual Activity: Not on file     Other Topics Concern    Abuse/Domestic Violence No    Drives Yes    Special Diet No    Uses Cane No    Uses Walker No    Uses Wheelchair No    Right Hand Dominant Yes    Left Hand Dominant No     Social History Narrative         Review of Systems   Constitutional: Negative for fever, malaise/fatigue and diaphoresis.   Respiratory: Negative for cough, hemoptysis, sputum production and shortness of breath.    Cardiovascular: Positive for chest pain. Negative for palpitations, orthopnea, claudication, syncope, PND and near-syncope.   Gastrointestinal: Negative for nausea, vomiting and abdominal pain.   Musculoskeletal: Negative for back pain.   Neurological: Negative for dizziness, seizures, weakness, numbness and headaches.       Objective:     BP 118/74 mmHg   Pulse 87   Temp(Src) 36.1 C (96.9 F) (Tympanic)   Resp 12   Ht 1.664 m (5' 5.5")   Wt 75.751 kg (167 lb)   BMI 27.36 kg/m2   SpO2 98%     Physical Exam   Constitutional: She is oriented to person, place, and time. She appears well-developed and well-nourished.   HENT:   Head: Normocephalic and atraumatic.   Right Ear: External ear normal.   Left Ear: External ear normal.   Eyes: No scleral icterus.   Neck: Normal range of motion. Neck supple. Carotid bruit is not present. No thyroid mass and no thyromegaly present.   Cardiovascular: Normal rate, regular rhythm and normal heart sounds.    Pulmonary/Chest: Effort normal and breath sounds normal. She exhibits no tenderness.   Abdominal: Soft. Bowel sounds are normal. She exhibits no distension. There is no tenderness. There is no rebound and no guarding.   Neurological: She is alert and oriented to person, place, and time. Coordination normal.   Nursing note and vitals reviewed.        Ortho Exam    Assessment & Plan:       ICD-10-CM    1. Atypical chest pain  Mid sternal sharp chest pain w/o radiation and no associated  sympoms, last few sec, gets better after taking anti acid, no CP/SOB/dizziness on exertion, most likely non cardiac etiology...>GI.  R07.89 ECG 12-LEAD  Follow up with Dr. Luiz Ochoa

## 2014-12-04 NOTE — Progress Notes (Signed)
Patient is here for follow up from Dr Luiz Ochoa, she has a chest discomfort in the middle sternum, that doesn't happen all the time but over the past month. The pain does not radiates it is stationary. She takes Maalox and this helps with the chest discomfort which subsides Vira Blanco, CMA

## 2014-12-08 LAB — ECG 12-LEAD
Atrial Rate: 57 {beats}/min
Calculated P Axis: 41 degrees
Calculated R Axis: -9 degrees
Calculated T Axis: 31 degrees
PR Interval: 204 ms
QRS Duration: 82 ms
QT Interval: 398 ms
QTC Calculation: 387 ms
Ventricular rate: 57 {beats}/min

## 2015-01-01 ENCOUNTER — Encounter (HOSPITAL_BASED_OUTPATIENT_CLINIC_OR_DEPARTMENT_OTHER): Payer: Self-pay

## 2015-01-01 ENCOUNTER — Ambulatory Visit (HOSPITAL_BASED_OUTPATIENT_CLINIC_OR_DEPARTMENT_OTHER)
Admission: RE | Admit: 2015-01-01 | Discharge: 2015-01-01 | Disposition: A | Discharge status: Home / Self Care | Payer: Medicare PPO | Source: Ambulatory Visit | Attending: GASTROENTEROLOGY | Admitting: GASTROENTEROLOGY

## 2015-01-01 VITALS — BP 166/92 | HR 56 | Ht 65.5 in | Wt 170.0 lb

## 2015-01-01 DIAGNOSIS — Z01812 Encounter for preprocedural laboratory examination: Secondary | ICD-10-CM | POA: Insufficient documentation

## 2015-01-01 DIAGNOSIS — Z1211 Encounter for screening for malignant neoplasm of colon: Secondary | ICD-10-CM | POA: Insufficient documentation

## 2015-01-01 DIAGNOSIS — Z01818 Encounter for other preprocedural examination: Secondary | ICD-10-CM | POA: Insufficient documentation

## 2015-01-01 DIAGNOSIS — I10 Essential (primary) hypertension: Secondary | ICD-10-CM | POA: Insufficient documentation

## 2015-01-01 HISTORY — DX: Nausea with vomiting, unspecified: R11.2

## 2015-01-01 HISTORY — DX: Heartburn: R12

## 2015-01-01 HISTORY — DX: Presence of spectacles and contact lenses: Z97.3

## 2015-01-01 HISTORY — DX: Obesity, unspecified: E66.9

## 2015-01-01 LAB — CBC
BASOPHIL #: 0 K/uL (ref 0.0–0.1)
BASOPHILS %: 0.4 % (ref 0.0–2.5)
EOSINOPHIL #: 0.2 10*3/uL (ref 0.0–0.5)
EOSINOPHIL %: 2.5 % (ref 0.0–5.2)
HCT: 37.3 % (ref 36.0–45.0)
HGB: 12.3 g/dL (ref 12.0–15.5)
LYMPHOCYTE #: 2.1 10*3/uL (ref 0.7–3.2)
LYMPHOCYTE %: 29.2 % (ref 15.0–43.0)
MCH: 27.9 pg — ABNORMAL LOW (ref 28.3–34.3)
MCHC: 33.1 g/dL (ref 32.0–36.0)
MCV: 84.4 fL (ref 82.0–97.0)
MONOCYTE #: 0.6 10*3/uL (ref 0.2–0.9)
MONOCYTE %: 8 % (ref 4.8–12.0)
MPV: 9.9 fL (ref 7.4–10.5)
NRBC ABSOLUTE: 0 10*3/uL (ref 0–0.02)
NRBC: 0 /100 WBC (ref 0–0.3)
PLATELET COUNT: 208 10*3/uL (ref 150–450)
PMN #: 4.2 K/uL (ref 1.5–6.5)
PMN %: 59.9 % (ref 43.0–76.0)
RBC: 4.42 M/uL (ref 4.00–5.10)
RDW: 14.1 % (ref 10.5–14.5)
WBC: 7.1 10*3/uL (ref 4.0–11.0)

## 2015-01-01 LAB — PT/INR
INR: 1.13
PROTHROMBIN TIME: 12.3 s (ref 9.4–12.5)

## 2015-01-01 LAB — BASIC METABOLIC PROFILE - BMC/JMC ONLY
ANION GAP: 7 mmol/L (ref 3–11)
BUN: 13 mg/dL (ref 6–22)
CALCIUM: 9.7 mg/dL (ref 8.5–10.5)
CARBON DIOXIDE: 30 mmol/L (ref 22–32)
CHLORIDE: 104 mmol/L (ref 101–111)
CREATININE: 0.96 mg/dL (ref 0.53–1.00)
ESTIMATED GLOMERULAR FILTRATION RATE: >60 mL/min (ref >60)
GLUCOSE: 121 mg/dL — ABNORMAL HIGH (ref 70–110)
POTASSIUM: 4.3 mmol/L (ref 3.5–5.0)
SODIUM: 141 mmol/L (ref 136–145)

## 2015-01-08 NOTE — H&P (Signed)
[facility]                                  [place], [place] [address]                                     [phone]                     SURGICAL HISTORY AND PHYSICAL    PATIENT NAME: Carrie Escobar, Carrie Escobar [facility] UXNATF:[id number]  DATE OF SERVICE:01/09/2015  DATE OF BIRTH: 1946-03-15    HISTORY OF PRESENT ILLNESS:  The patient is a 69 year old woman undergoing a screening colonoscopy.  Her weight has been stable.  She has a good appetite.  She has no nausea, vomiting, pyrosis or dysphagia.  She denies abdominal pain, diarrhea, constipation, melena, or hematochezia.    PAST MEDICAL HISTORY:  Significant for total abdominal hysterectomy, bilateral salpingo-oophorectomy, shoulder surgery, hypertension, glaucoma.    Medications:  Timolol, Norvasc, Ziac.    ALLERGIES:  AMOXICILLIN.    SOCIAL HISTORY:  She does not smoke or drink.    FAMILY HISTORY:  Mother had colon cancer in her 68s.    PHYSICAL EXAMINATION:  GENERAL:  She is a well-developed woman.  VITAL SIGNS:  Blood pressure 130/82, pulse 65, weight 169 pounds.  HEENT:  Conjunctivae pink, sclerae nonicteric.  No lymphadenopathy.  HEART:  Regular rate and rhythm.   LUNGS:  Clear.  ABDOMEN:  Bowel sounds present, soft and nontender without masses or hepatosplenomegaly.      ASSESSMENT AND PLAN:  The patient is a 68 year old woman who will undergo a screening colonoscopy.    I thank Dr. Tempie Donning for letting me participate in this patient's care.      Kirstie Peri, MD      HC/WC/[id number]; D: 01/08/2015 20:45:55; T: 01/08/2015 21:17:09    cc: Kirstie Peri MD      Darlin Priestly MD      Suanne Marker

## 2015-01-09 ENCOUNTER — Encounter (HOSPITAL_BASED_OUTPATIENT_CLINIC_OR_DEPARTMENT_OTHER)
Admission: RE | Disposition: A | Discharge status: Home / Self Care | Payer: Self-pay | Source: Ambulatory Visit | Attending: GASTROENTEROLOGY

## 2015-01-09 ENCOUNTER — Inpatient Hospital Stay (HOSPITAL_BASED_OUTPATIENT_CLINIC_OR_DEPARTMENT_OTHER)
Admission: RE | Admit: 2015-01-09 | Discharge: 2015-01-09 | Disposition: A | Discharge status: Home / Self Care | Payer: Medicare PPO | Source: Ambulatory Visit | Attending: GASTROENTEROLOGY | Admitting: GASTROENTEROLOGY

## 2015-01-09 ENCOUNTER — Other Ambulatory Visit (HOSPITAL_COMMUNITY): Payer: Self-pay | Admitting: GASTROENTEROLOGY

## 2015-01-09 ENCOUNTER — Encounter (HOSPITAL_BASED_OUTPATIENT_CLINIC_OR_DEPARTMENT_OTHER): Payer: Self-pay

## 2015-01-09 DIAGNOSIS — K5730 Diverticulosis of large intestine without perforation or abscess without bleeding: Secondary | ICD-10-CM | POA: Insufficient documentation

## 2015-01-09 DIAGNOSIS — Z86711 Personal history of pulmonary embolism: Secondary | ICD-10-CM | POA: Insufficient documentation

## 2015-01-09 DIAGNOSIS — Z7982 Long term (current) use of aspirin: Secondary | ICD-10-CM | POA: Insufficient documentation

## 2015-01-09 DIAGNOSIS — H409 Unspecified glaucoma: Secondary | ICD-10-CM | POA: Insufficient documentation

## 2015-01-09 DIAGNOSIS — Z800 Family history of malignant neoplasm of digestive organs: Secondary | ICD-10-CM | POA: Insufficient documentation

## 2015-01-09 DIAGNOSIS — Z1211 Encounter for screening for malignant neoplasm of colon: Secondary | ICD-10-CM | POA: Insufficient documentation

## 2015-01-09 DIAGNOSIS — I10 Essential (primary) hypertension: Secondary | ICD-10-CM | POA: Insufficient documentation

## 2015-01-09 DIAGNOSIS — D123 Benign neoplasm of transverse colon: Secondary | ICD-10-CM | POA: Insufficient documentation

## 2015-01-09 DIAGNOSIS — K635 Polyp of colon: Secondary | ICD-10-CM | POA: Insufficient documentation

## 2015-01-09 DIAGNOSIS — E669 Obesity, unspecified: Secondary | ICD-10-CM | POA: Insufficient documentation

## 2015-01-09 LAB — PERFORM POC FINGERSTICK GLUCOSE: BLD GLUCOSE POCT: 86 mg/dL (ref 60–100)

## 2015-01-09 SURGERY — COLONOSCOPY WITH BIOPSY
Anesthesia: General | Wound class: Clean Contaminated Wounds-The respiratory, GI, Genital, or urinary

## 2015-01-09 MED ORDER — LACTATED RINGERS INTRAVENOUS SOLUTION
INTRAVENOUS | Status: DC
Start: 2015-01-09 — End: 2015-01-09

## 2015-01-09 MED ADMIN — metoprolol tartrate 25 mg tablet: INTRAVENOUS | @ 11:00:00

## 2015-01-09 SURGICAL SUPPLY — 6 items
CONV USE ITEM 308902 - GLOVE SURG 7.5 LF PF BEAD CUF_STRL 12IN PROTEXIS PI PLISPRN (GLOVES AND ACCESSORIES) ×1
CONV USE ITEM 308913 - GLOVE SURG 8.5 LF PF BEAD CUF_STRL PROTEXIS PI PLISPRN (GLOVES AND ACCESSORIES) ×1
CONV USE ITEM 321846 - GLOVE SURG 7.5 LF PF BEAD CUF_STRL 12IN PROTEXIS PI PLISPRN (GLOVES AND ACCESSORIES) ×1 IMPLANT
CONV USE ITEM 321850 - GLOVE SURG 8.5 LF PF BEAD CUF_STRL PROTEXIS PI PLISPRN (GLOVES AND ACCESSORIES) ×1 IMPLANT
FORCEPS BIOPSY MICROMESH TTH STREAMLINE CATH NEEDLE 240CM 2.4MM RJ 4 SS LRG CPC STRL DISP ORNG 2.8MM (SURGICAL INSTRUMENTS) ×1 IMPLANT
FORCEPS BIOPSY NEEDLE 240CM 2._4MM RJ 4 2.8MM LRG CPC STRL (INSTRUMENTS) ×1

## 2015-01-09 NOTE — Nurses Notes (Signed)
Dr. Luiz Ochoa to bedside at 1406, discussed procedure with pt. Patient discharged home with family.  AVS reviewed with patient/care giver by S. Quentin Cornwall RN.  A written copy of the AVS and discharge instructions was given to the patient/care giver.  Questions sufficiently answered as needed.  Patient/care giver encouraged to follow up with PCP as indicated.  In the event of an emergency, patient/care giver instructed to call 911 or go to the nearest emergency room.

## 2015-01-09 NOTE — Discharge Instructions (Signed)
[facility]  [place],  [address]     Anesthesia Discharge Instructions    1.  Do NOT drive an automobile today.    2.  Do NOT sign any legal documents for 24 hours.    3.  Do NOT operate any electrical equipment today.    Fall risk prevention education has been reviewed with the patient/significant other.

## 2015-01-09 NOTE — Brief Op Note (Signed)
[place]                                               BRIEF OPERATIVE NOTE - Colonoscopy    Patient Name: [facility] Number: [id number]  Encounter number: [id number]  Date of Service: 01/09/2015   Date of Birth: 10-30-1945    Last colonoscopy:  was >/= 10 years ago as screening colonoscopy without biopsy or polypectomy    Pre-Operative Diagnosis: SCREENING COLON     Post-Operative Diagnosis: polyps    Procedure(s)/Description:  Procedure(s):  COLONOSCOPY WITH BIOPSY    Attending Surgeon: Marlana Salvage, MD    Findings: polyp hepatic flexure, 38     Specimens:  See Nurse note    Estimated Blood Loss:  Minimal    Complications:  None           Disposition: Phase II           Condition: stable    Colonoscopy follow up:  Recommend follow up in 3-5 years unless pathology report indicates otherwise     Patient is at increased risk for surgical bleeding:  No    Patient is on postop antibiotic regimen greater than 24 hours:  No

## 2015-01-09 NOTE — Consults (Signed)
[facility]  [place], [place]  [address]    ANESTHESIA PRE-OP EVALUATION    MRN:  [id number]  Carrie Escobar  69 y.o.  Sex: female     Date of Service: 01/09/2015    Surgeon: Juliann Mule):  Marlana Salvage, MD    Scheduled Procedure:  Procedure(s):  COLONOSCOPY WITH BIOPSY  COLONOSCOPY WITH POLYPECTOMY    Diagnosis/Pertinant HPI: SCREENING COLON     Weight: 77.111 kg (170 lb)  Height: 166.4 cm (5' 5.5")     Filed Vitals:    01/09/15 1055   BP: 156/88   Pulse: 66   Temp: 36.6 C (97.8 F)   Resp: 16   SpO2: 100%            ALLERGY:    Allergies   Allergen Reactions    Amoxicillin Rash       Medications Prior to Admission     Prescriptions    amLODIPine (NORVASC) 5 mg Oral Tablet    Take 1 Tab (5 mg total) by mouth Once a day    aspirin (ECOTRIN) 81 mg Oral Tablet, Delayed Release (E.C.)    Take 81 mg by mouth Once a day    bisoprolol-hydrochlorothiazide (ZIAC) 10-6.25 mg Oral Tablet    TAKE 1 TABLET DAILY    calcium citrate-vitamin D3 (CITRACAL) 200 mg calcium -250 unit Oral Tablet    Take 1 Tab by mouth Once a day    Flaxseed Oil 1,000 mg Oral Capsule    Take by mouth    multivitamin Oral Tablet    Take 1 Tab by mouth Once a day    timolol maleate (TIMOPTIC) 0.25 % Ophthalmic Drops    Instill 1 Drop into right eye Twice daily          Current Facility-Administered Medications:  LR premix infusion  Intravenous Continuous       Past Medical History   Diagnosis Date    Breast mass, right      benign    HTN (hypertension)     Cataract      bilat    Pulmonary emboli      only once in 1999 after her hysterectomy    Family history of colon cancer     Nausea with vomiting      sister with PONV    Wears glasses      reading    Heartburn     Obesity          Past Surgical History   Procedure Laterality Date    Hx hysterectomy  1999    Hx rotator cuff repair  11/04/2013    Hx cataract removal  10/2013         Pregnant: no    Prior Anesthesia Difficulties:  no    Familial  Anesthesia Difficulties: yes    Social History     Occupational History          Social History Main Topics    Smoking status: Never Smoker     Smokeless tobacco: Never Used    Alcohol Use: No    Drug Use: No    Sexual Activity: Not on file     Other Topics Concern    Abuse/Domestic Violence No    Drives Yes    Special Diet No    Uses Cane No    Uses Walker No    Uses Wheelchair No    Right Hand Dominant Yes  Left Hand Dominant No    Ability To Walk 1 Flight Of Steps Without Sob/Cp Yes    Routine Exercise Yes     Zumba class    Ability To Walk 2 Flight Of Steps Without Sob/Cp Yes    Ability To Do Own Adl's Yes       Labs:   BMP:      Lab Results   Component Value Date    SODIUM 141 01/01/2015    POTASSIUM 4.3 01/01/2015    CHLORIDE 104 01/01/2015    CO2 30 01/01/2015    BUN 13 01/01/2015    CREATININE 0.96 01/01/2015    GLUCOSECJ 121* 01/01/2015    ANIONGAP 7 01/01/2015    GFR >60 01/01/2015    CALCIUM 9.7 01/01/2015     CBC:    Lab Results   Component Value Date    WBC 7.1 01/01/2015    HGB 12.3 01/01/2015    HCT 37.3 01/01/2015    PLTCNT 208 01/01/2015      Platelets:    Lab Results   Component Value Date    PLTCNT 208 01/01/2015     Coags:    Lab Results   Component Value Date    PROTHROMTME 12.3 01/01/2015    INR 1.13 01/01/2015     TSH: No results found for: TSHCAS, TSH3RDGEN, FT3, FREET4  Pregnancy test: No results found for: PREGNANCURED, HCGJ, HCGQUALC   Fingerstick glucose:  No results found for: POCGLU          EKG: 04-Dec-2014 13:42:26 [facility]  Sinus bradycardia  Moderate voltage criteria for LVH, may be normal variant  Borderline ECG  When compared with ECG of 26-Mar-2010 12:50,  No significant change was found  Confirmed by AHMED MD, SAYEED ([id number]) on 12/08/2014 9:30:13 PM  CXR:  N/A      ANESTHESIA DAY OF SURGERY EVALUATION      NPO: Meds with sips of water  Airway: II (soft palate, uvula, fauces visible)  Dentition:  Upper and lower  Missing    Cardiovascular: regular  rate and rhythm   Respiratory: clear to auscultation bilaterally       ASA Status: 2  Proposed Anesthesia: MAC/GA/PIV      Pre-Induction Evaluation and informed consent including risks, benefits, and alternatives. Patient was seen and evaluated immediately prior to the induction of anesthesia.

## 2015-01-09 NOTE — Nurses Notes (Signed)
RN can lock up belongings while in surgery, patient understands any missing belongings are patients responsibility. Patient instructed to please send any valuable items with family/visitor to ensure no lost or stolen items.        Allena Pietila, RN

## 2015-01-09 NOTE — Nurses Notes (Signed)
Pt's vital signs are stable. Pt has met all criteria for discharge. Currently awaiting Dr. Renold Genta arrival to bedside to discuss procedure.

## 2015-01-11 NOTE — OR Surgeon (Signed)
[facility]                                  [place], [place] [address]                                     [phone]                           OPERATIVE SUMMARY    PATIENT NAME: Carrie, AZAM [facility] Escobar  DATE OF SERVICE:01/09/2015  DATE OF BIRTH: Nov 10, 1945    NAME OF PROCEDURE:  Colonoscopy with biopsies.    SURGEON:  Kirstie Peri, MD    MEDICATIONS:  As per anesthesia.    PREOPERATIVE DIAGNOSIS:  This is a 69 year old woman undergoing a screening colonoscopy.    POSTOPERATIVE DIAGNOSES:  1.  Normal distal 3 centimeters of the terminal ileum.  2.  A 0.4 centimeter flat polyp at the hepatic flexure, status post cold biopsy removal.  3.  A 0.4 centimeter flat polyp at 38 centimeters, status post cold biopsy removal.  4.  Mild right and left-sided diverticulosis post-insertion.    DESCRIPTION OF PROCEDURE: After informed consent was obtained from the patient who was explained the procedure of a colonoscopy and the inherent risks, which include possible bleeding, possible infection, possible perforation with the need for surgery,  the patient understood these risks and was willing to undergo the procedure.  Under continuous monitoring of vital signs and pulse ox, the patient had adequate IV medications and digital rectal examination, which revealed no masses.   The Olympus video colonoscope was introduced under direct visualization to the base of the cecum.  The cecum was identified by noting the appendiceal orifice as well as the ileocecal valve.  The findings are as follows - the cecum appeared grossly within normal limits without vascular ectasias or masses.   The colonoscope was retroflexed.  No masses were noted.  The colonoscope was withdrawn to the level of the ileocecal valve which was traversed.  The distal 3-4 cm of the terminal ileum appeared grossly within normal limits without ulcerations  or erythema.  Scope was taken across the ascending colon where a 0.4-0.5 centimeter flat polyp was noted at the hepatic flexure and removed with cold biopsy forceps.  The transverse colon had mild diverticulosis.  The descending and sigmoid colons had mild diverticulosis.  There was a 0.4 centimeter flat polyp at 38 cm, removed with cold biopsy forceps.  The rectum appeared grossly within normal limits.  On retroflexion, no masses were noted.  The colonoscope was removed.  The patient tolerated the procedure well.    PLAN:  Await pathology.  The patient will probably need a repeat colonoscopy in approximately 5 years.    I thank Dr. Tempie Donning for allowing me to participate in Carrie Escobar's care.      Kirstie Peri, MD      [id number]; D: 01/09/2015 13:06:02; T: 01/11/2015 10:48:43    cc: Kayleen Memos MD      Suanne Marker

## 2015-01-12 LAB — HISTORICAL SURGICAL PATHOLOGY SPECIMEN

## 2015-02-12 ENCOUNTER — Encounter: Payer: Self-pay | Admitting: *Deleted

## 2015-02-13 ENCOUNTER — Encounter
Admission: RE | Disposition: A | Discharge status: Home / Self Care | Payer: Self-pay | Source: Ambulatory Visit | Attending: Gastroenterology

## 2015-02-13 ENCOUNTER — Ambulatory Visit
Admission: RE | Admit: 2015-02-13 | Discharge: 2015-02-13 | Disposition: A | Discharge status: Home / Self Care | Payer: Medicare Other | Source: Ambulatory Visit | Attending: Gastroenterology | Admitting: Gastroenterology

## 2015-02-13 ENCOUNTER — Encounter: Payer: Self-pay | Admitting: *Deleted

## 2015-02-13 ENCOUNTER — Ambulatory Visit: Payer: Medicare Other | Admitting: Anesthesiology

## 2015-02-13 DIAGNOSIS — K5730 Diverticulosis of large intestine without perforation or abscess without bleeding: Secondary | ICD-10-CM | POA: Diagnosis not present

## 2015-02-13 DIAGNOSIS — F329 Major depressive disorder, single episode, unspecified: Secondary | ICD-10-CM | POA: Insufficient documentation

## 2015-02-13 DIAGNOSIS — Z9889 Other specified postprocedural states: Secondary | ICD-10-CM | POA: Insufficient documentation

## 2015-02-13 DIAGNOSIS — Z8371 Family history of colonic polyps: Secondary | ICD-10-CM | POA: Insufficient documentation

## 2015-02-13 DIAGNOSIS — E785 Hyperlipidemia, unspecified: Secondary | ICD-10-CM | POA: Diagnosis not present

## 2015-02-13 DIAGNOSIS — K640 First degree hemorrhoids: Secondary | ICD-10-CM | POA: Diagnosis not present

## 2015-02-13 DIAGNOSIS — Z90710 Acquired absence of both cervix and uterus: Secondary | ICD-10-CM | POA: Diagnosis not present

## 2015-02-13 DIAGNOSIS — Z1211 Encounter for screening for malignant neoplasm of colon: Secondary | ICD-10-CM | POA: Diagnosis present

## 2015-02-13 DIAGNOSIS — Z87891 Personal history of nicotine dependence: Secondary | ICD-10-CM | POA: Insufficient documentation

## 2015-02-13 DIAGNOSIS — E039 Hypothyroidism, unspecified: Secondary | ICD-10-CM | POA: Diagnosis not present

## 2015-02-13 DIAGNOSIS — K645 Perianal venous thrombosis: Secondary | ICD-10-CM | POA: Diagnosis not present

## 2015-02-13 DIAGNOSIS — Z79899 Other long term (current) drug therapy: Secondary | ICD-10-CM | POA: Insufficient documentation

## 2015-02-13 DIAGNOSIS — K219 Gastro-esophageal reflux disease without esophagitis: Secondary | ICD-10-CM | POA: Insufficient documentation

## 2015-02-13 DIAGNOSIS — J45909 Unspecified asthma, uncomplicated: Secondary | ICD-10-CM | POA: Diagnosis not present

## 2015-02-13 HISTORY — DX: Gastro-esophageal reflux disease without esophagitis: K21.9

## 2015-02-13 HISTORY — DX: Hypothyroidism, unspecified: E03.9

## 2015-02-13 HISTORY — DX: Family history of colon polyps, unspecified: Z83.719

## 2015-02-13 HISTORY — DX: Major depressive disorder, single episode, unspecified: F32.9

## 2015-02-13 HISTORY — PX: COLONOSCOPY: SHX5424

## 2015-02-13 HISTORY — DX: Anemia, unspecified: D64.9

## 2015-02-13 HISTORY — DX: Hyperlipidemia, unspecified: E78.5

## 2015-02-13 HISTORY — DX: Unspecified asthma, uncomplicated: J45.909

## 2015-02-13 HISTORY — DX: Depression, unspecified: F32.A

## 2015-02-13 HISTORY — DX: Restless legs syndrome: G25.81

## 2015-02-13 HISTORY — DX: Family history of colonic polyps: Z83.71

## 2015-02-13 HISTORY — DX: Unspecified lump in unspecified breast: N63.0

## 2015-02-13 SURGERY — COLONOSCOPY
Anesthesia: Monitor Anesthesia Care

## 2015-02-13 MED ORDER — LIDOCAINE HCL (CARDIAC) 20 MG/ML IV SOLN
INTRAVENOUS | Status: DC | PRN
Start: 2015-02-13 — End: 2015-02-13
  Administered 2015-02-13: 20 mg via INTRAVENOUS

## 2015-02-13 MED ORDER — PROPOFOL INFUSION 10 MG/ML OPTIME
INTRAVENOUS | Status: DC | PRN
Start: 2015-02-13 — End: 2015-02-13
  Administered 2015-02-13: 100 ug/kg/min via INTRAVENOUS

## 2015-02-13 MED ORDER — PROPOFOL 10 MG/ML IV BOLUS
INTRAVENOUS | Status: DC | PRN
  Administered 2015-02-13: 80 mg via INTRAVENOUS

## 2015-02-13 MED ORDER — SODIUM CHLORIDE 0.9 % IV SOLN
INTRAVENOUS | Status: DC
Start: 2015-02-13 — End: 2015-02-13
  Administered 2015-02-13: 10:00:00 via INTRAVENOUS
  Administered 2015-02-13: 1000 mL via INTRAVENOUS

## 2015-02-13 NOTE — Anesthesia Preprocedure Evaluation (Addendum)
Anesthesia Evaluation  Patient identified by MRN, date of birth, ID band Patient awake    Reviewed: Allergy & Precautions, NPO status , Patient's Chart, lab work & pertinent test results  History of Anesthesia Complications Negative for: history of anesthetic complications  Airway Mallampati: III  TM Distance: >3 FB Neck ROM: Full    Dental no notable dental hx.    Pulmonary asthma ,  breath sounds clear to auscultation  Pulmonary exam normal       Cardiovascular Exercise Tolerance: Good negative cardio ROS Normal cardiovascular examRhythm:Regular Rate:Normal     Neuro/Psych PSYCHIATRIC DISORDERS Depression negative neurological ROS     GI/Hepatic Neg liver ROS, GERD-  Medicated and Controlled,  Endo/Other  Hypothyroidism   Renal/GU negative Renal ROS  negative genitourinary   Musculoskeletal negative musculoskeletal ROS (+)   Abdominal   Peds negative pediatric ROS (+)  Hematology  (+) anemia ,   Anesthesia Other Findings   Reproductive/Obstetrics negative OB ROS                            Anesthesia Physical Anesthesia Plan  ASA: III  Anesthesia Plan: MAC   Post-op Pain Management:    Induction: Intravenous  Airway Management Planned: Nasal Cannula  Additional Equipment:   Intra-op Plan:   Post-operative Plan:   Informed Consent: I have reviewed the patients History and Physical, chart, labs and discussed the procedure including the risks, benefits and alternatives for the proposed anesthesia with the patient or authorized representative who has indicated his/her understanding and acceptance.     Plan Discussed with: CRNA and Surgeon  Anesthesia Plan Comments:        Anesthesia Quick Evaluation

## 2015-02-13 NOTE — H&P (Signed)
   Primary Care Physician:  Margaretann LovelessKHAN,NEELAM S, MD  Pre-Procedure History & Physical: HPI:  Leah Garza is a 69 y.o. female is here for an colonoscopy.   Past Medical History  Diagnosis Date  . Asthma   . Anemia   . GERD (gastroesophageal reflux disease)   . Depression   . Hypothyroidism   . Hyperlipemia   . Restless leg syndrome   . Breast lump in female   . Family history of colonic polyps     brother    Past Surgical History  Procedure Laterality Date  . Bladder suspension  2008  . Tonsillectomy  1960  . Appendectomy  1975  . Abdominal hysterectomy  1976  . Abdominoplasty  1993    Prior to Admission medications   Medication Sig Start Date End Date Taking? Authorizing Provider  albuterol (PROVENTIL HFA;VENTOLIN HFA) 108 (90 BASE) MCG/ACT inhaler Inhale 2 puffs into the lungs.   Yes Historical Provider, MD  clonazePAM (KLONOPIN) 1 MG tablet Take 1 mg by mouth 3 times/day as needed-between meals & bedtime (sleep).    Yes Historical Provider, MD  naproxen sodium (ANAPROX) 220 MG tablet Take 880 mg by mouth daily as needed (headache, back ache).   Yes Historical Provider, MD  thyroid (ARMOUR) 90 MG tablet Take 90 mg by mouth daily with breakfast.    Yes Historical Provider, MD  Na Sulfate-K Sulfate-Mg Sulf (SUPREP BOWEL PREP PO) Take 1 Container by mouth once.    Historical Provider, MD  pantoprazole (PROTONIX) 40 MG tablet Take 40 mg by mouth daily.    Historical Provider, MD    Allergies as of 02/03/2015  . (Not on File)    History reviewed. No pertinent family history.  History   Social History  . Marital Status: Married    Spouse Name: N/A  . Number of Children: N/A  . Years of Education: N/A   Occupational History  . Not on file.   Social History Main Topics  . Smoking status: Former Games developermoker  . Smokeless tobacco: Not on file  . Alcohol Use: 0.6 oz/week    1 Glasses of wine per week  . Drug Use: Not on file  . Sexual Activity: Not on file   Other Topics  Concern  . Not on file   Social History Narrative    Review of Systems: See HPI, otherwise negative ROS  Physical Exam: BP 165/74 mmHg  Pulse 70  Temp(Src) 98.5 F (36.9 C) (Tympanic)  Resp 16  Ht 5\' 1"  (1.549 m)  Wt 163 lb (73.936 kg)  BMI 30.81 kg/m2 General:   Alert,  pleasant and cooperative in NAD Head:  Normocephalic and atraumatic. Neck:  Supple; no masses or thyromegaly. Lungs:  Clear throughout to auscultation.    Heart:  Regular rate and rhythm. Abdomen:  Soft, nontender and nondistended. Normal bowel sounds, without guarding, and without rebound.   Neurologic:  Alert and  oriented x4;  grossly normal neurologically.  Impression/Plan: Leah Garza is here for an colonoscopy to be performed for screening.   Risks, benefits, limitations, and alternatives regarding  colonoscopy have been reviewed with the patient.  Questions have been answered.  All parties agreeable.   Elnita MaxwellEIN, Richy Spradley GORDON, MD  02/13/2015, 9:54 AM

## 2015-02-13 NOTE — Anesthesia Postprocedure Evaluation (Incomplete)
  Anesthesia Post-op Note  Patient: Leah Garza  Procedure(s) Performed: Procedure(s): COLONOSCOPY (N/A)  Anesthesia type:MAC  Patient location: PACU  Post pain: Pain level controlled  Post assessment: Post-op Vital signs reviewed, Patient's Cardiovascular Status Stable, Respiratory Function Stable, Patent Airway and No signs of Nausea or vomiting  Post vital signs: Reviewed and stable  Last Vitals:  Filed Vitals:   02/13/15 0835  BP: 165/74  Pulse: 70  Temp: 36.9 C  Resp: 16    Level of consciousness: awake, alert  and patient cooperative  Complications: No apparent anesthesia complications

## 2015-02-13 NOTE — Transfer of Care (Signed)
Immediate Anesthesia Transfer of Care Note  Patient: Leah Garza  Procedure(s) Performed: Procedure(s): COLONOSCOPY (N/A)  Patient Location: PACU  Anesthesia Type:MAC  Level of Consciousness: awake  Airway & Oxygen Therapy: Patient Spontanous Breathing and Patient connected to nasal cannula oxygen  Post-op Assessment: Report given to RN and Post -op Vital signs reviewed and stable  Post vital signs: stable  Last Vitals:  Filed Vitals:   02/13/15 0835  BP: 165/74  Pulse: 70  Temp: 36.9 C  Resp: 16    Complications: No apparent anesthesia complications

## 2015-02-13 NOTE — Op Note (Signed)
[facility]astroenterology Patient Name: Leah SallesJoan Garza Procedure Date: 02/13/2015 9:57 AM MRN: [id number] Account #: [id number] Date of Birth: 03/02/1946 Admit Type: Outpatient Age: [age] [facility] Gender: Female Note Status: Finalized Procedure:         Colonoscopy Indications:       Colon cancer screening in patient at increased risk:                     Family history of colon polyps, This is the patient's                     first colonoscopy Providers:         Rhona RaiderMatthew G. Shelle Ironein, MD Referring MD:      Margaretann LovelessNeelam S. Khan, MD (Referring MD) Medicines:         Propofol per Anesthesia Complications:     No immediate complications. Procedure:         Pre-Anesthesia Assessment:                    - Prior to the procedure, a History and Physical was                     performed, and patient medications and allergies were                     reviewed. The patient is competent. The risks and benefits                     of the procedure and the sedation options and risks were                     discussed with the patient. All questions were answered                     and informed consent was obtained. Patient identification                     and proposed procedure were verified by the physician and                     the nurse in the pre-procedure area. Mental Status                     Examination: alert and oriented. Airway Examination:                     normal oropharyngeal airway and neck mobility. Respiratory                     Examination: clear to auscultation. CV Examination: RRR,                     no murmurs, no S3 or S4. Prophylactic Antibiotics: The                     patient does not require prophylactic antibiotics. Prior                     Anticoagulants: The patient has taken no previous                     anticoagulant or antiplatelet agents. ASA Grade  Assessment: II - A patient with mild systemic disease.               After reviewing the risks and benefits, the patient was                     deemed in satisfactory condition to undergo the procedure.                     The anesthesia plan was to use monitored anesthesia care                     (MAC). Immediately prior to administration of medications,                     the patient was re-assessed for adequacy to receive                     sedatives. The heart rate, respiratory rate, oxygen                     saturations, blood pressure, adequacy of pulmonary                     ventilation, and response to care were monitored                     throughout the procedure. The physical status of the                     patient was re-assessed after the procedure.                    - Prior to the procedure, a History and Physical was                     performed, and patient medications, allergies and                     sensitivities were reviewed. The patient's tolerance of                     previous anesthesia was reviewed.                    After obtaining informed consent, the colonoscope was                     passed under direct vision. Throughout the procedure, the                     patient's blood pressure, pulse, and oxygen saturations                     were monitored continuously. The Colonoscope was                     introduced through the anus and advanced to the the cecum,                     identified by appendiceal orifice and ileocecal valve. The                     colonoscopy was extremely difficult due to significant                     looping and a tortuous  colon. Successful completion of the                     procedure was aided by withdrawing the scope and replacing                     with the adult endoscope. The patient tolerated the                     procedure well. The quality of the bowel preparation was                     excellent. Findings:      The digital rectal exam findings include  non-thrombosed external       hemorrhoids.      Many small and large-mouthed diverticula were found in the sigmoid colon.      The recto-sigmoid colon was significantly redundant. Advancing the scope       required withdrawing the scope and replacing with the adult endoscope.       Unable to advance past RS with pediatric c-scope.      Internal hemorrhoids were found during retroflexion. The hemorrhoids       were Grade I (internal hemorrhoids that do not prolapse). Impression:        - Non-thrombosed external hemorrhoids found on digital                     rectal exam.                    - Diverticulosis in the sigmoid colon.                    - Redundant colon.                    - Internal hemorrhoids.                    - No specimens collected. Recommendation:    - Observe patient in GI recovery unit.                    - High fiber diet.                    - Continue present medications.                    - Repeat colonoscopy in 5 years for screening purposes.                     Would start with upper endoscopoe ( Unable to advance past                     RS with pediatric c-scope)                    - Return to referring physician.                    - The findings and recommendations were discussed with the                     patient.                    - The findings and recommendations were discussed with the  patient's family. Procedure Code(s): --- Professional ---                    [phone], Colonoscopy, flexible; diagnostic, including                     collection of specimen(s) by brushing or washing, when                     performed (separate procedure) CPT copyright 2014 American Medical [employer]. All rights reserved. The codes documented in this report are preliminary and upon coder review may  be revised to meet current compliance requirements. Kathalene Frames, MD 02/13/2015 10:48:37 AM This report has been signed electronically. Number  of Addenda: 0 Note Initiated On: 02/13/2015 9:57 AM Scope Withdrawal Time: 0 hours 9 minutes 10 seconds  Total Procedure Duration: 0 hours 35 minutes 43 seconds       [facility]

## 2015-02-13 NOTE — Anesthesia Procedure Notes (Signed)
Procedure Name: MAC Performed by: Paulette BlanchPARAS, Arlo Butt Pre-anesthesia Checklist: Patient identified, Emergency Drugs available, Suction available, Patient being monitored and Timeout performed Oxygen Delivery Method: Nasal cannula

## 2015-02-13 NOTE — Transfer of Care (Signed)
Immediate Anesthesia Transfer of Care Note  Patient: Leah Garza  Procedure(s) Performed: Procedure(s): COLONOSCOPY (N/A)  Patient Location:   Anesthesia Type:MAC  Level of Consciousness: awake  Airway & Oxygen Therapy: Patient Spontanous Breathing  Post-op Assessment: Report given to RN  Post vital signs: stable  Last Vitals:  Filed Vitals:   02/13/15 0835  BP: 165/74  Pulse: 70  Temp: 36.9 C  Resp: 16    Complications: No apparent anesthesia complications

## 2015-02-13 NOTE — H&P (Signed)
@  [email or web address]@  Primary Care Physician:  Margaretann LovelessKHAN,NEELAM S, MD  Pre-Procedure History & Physical: HPI:  Leah Garza is a 69 y.o. female is here for an colonoscopy.   Past Medical History  Diagnosis Date  . Asthma   . Anemia   . GERD (gastroesophageal reflux disease)   . Depression   . Hypothyroidism   . Hyperlipemia   . Restless leg syndrome   . Breast lump in female   . Family history of colonic polyps     brother    Past Surgical History  Procedure Laterality Date  . Bladder suspension  2008  . Tonsillectomy  1960  . Appendectomy  1975  . Abdominal hysterectomy  1976  . Abdominoplasty  1993    Prior to Admission medications   Medication Sig Start Date End Date Taking? Authorizing Provider  albuterol (PROVENTIL HFA;VENTOLIN HFA) 108 (90 BASE) MCG/ACT inhaler Inhale 2 puffs into the lungs.   Yes Historical Provider, MD  clonazePAM (KLONOPIN) 1 MG tablet Take 1 mg by mouth 3 times/day as needed-between meals & bedtime (sleep).    Yes Historical Provider, MD  naproxen sodium (ANAPROX) 220 MG tablet Take 880 mg by mouth daily as needed (headache, back ache).   Yes Historical Provider, MD  thyroid (ARMOUR) 90 MG tablet Take 90 mg by mouth daily with breakfast.    Yes Historical Provider, MD  Na Sulfate-K Sulfate-Mg Sulf (SUPREP BOWEL PREP PO) Take 1 Container by mouth once.    Historical Provider, MD  pantoprazole (PROTONIX) 40 MG tablet Take 40 mg by mouth daily.    Historical Provider, MD    Allergies as of 02/03/2015  . (Not on File)    History reviewed. No pertinent family history.  History   Social History  . Marital Status: Married    Spouse Name: N/A  . Number of Children: N/A  . Years of Education: N/A   Occupational History  . Not on file.   Social History Main Topics  . Smoking status: Former Games developermoker  . Smokeless tobacco: Not on file  . Alcohol Use: 0.6 oz/week    1 Glasses of wine per week  . Drug Use: Not on file  . Sexual Activity: Not on file   Other  Topics Concern  . Not on file   Social History Narrative    Review of Systems: See HPI, otherwise negative ROS  Physical Exam: BP 115/60 mmHg  Pulse 73  Temp(Src) 98.5 F (36.9 C) (Tympanic)  Resp 19  Ht 5\' 1"  (1.549 m)  Wt 163 lb (73.936 kg)  BMI 30.81 kg/m2  SpO2 97% General:   Alert,  pleasant and cooperative in NAD Head:  Normocephalic and atraumatic. Neck:  Supple; no masses or thyromegaly. Lungs:  Clear throughout to auscultation.    Heart:  Regular rate and rhythm. Abdomen:  Soft, nontender and nondistended. Normal bowel sounds, without guarding, and without rebound.   Neurologic:  Alert and  oriented x4;  grossly normal neurologically.  Impression/Plan: Leah NumberJoan M Dulude is here for an colonoscopy to be performed for screening, fam hx colon ca  Risks, benefits, limitations, and alternatives regarding  colonoscopy have been reviewed with the patient.  Questions have been answered.  All parties agreeable.   Elnita MaxwellEIN, Donie Lemelin GORDON, MD  02/13/2015, 10:59 AM

## 2015-02-13 NOTE — Discharge Instructions (Signed)

## 2015-02-13 NOTE — Anesthesia Postprocedure Evaluation (Signed)
  Anesthesia Post-op Note  Patient: Leah Garza  Procedure(s) Performed: Procedure(s): COLONOSCOPY (N/A)  Anesthesia type:MAC  Patient location: PACU  Post pain: Pain level controlled  Post assessment: Post-op Vital signs reviewed, Patient's Cardiovascular Status Stable, Respiratory Function Stable, Patent Airway and No signs of Nausea or vomiting  Post vital signs: Reviewed and stable  Last Vitals:  Filed Vitals:   02/13/15 1100  BP: 109/60  Pulse: 69  Temp:   Resp: 17    Level of consciousness: awake, alert  and patient cooperative  Complications: No apparent anesthesia complications

## 2015-03-10 ENCOUNTER — Encounter: Payer: Self-pay | Admitting: Geriatric Medicine

## 2015-03-10 ENCOUNTER — Other Ambulatory Visit (HOSPITAL_BASED_OUTPATIENT_CLINIC_OR_DEPARTMENT_OTHER)
Admission: RE | Admit: 2015-03-10 | Discharge: 2015-03-10 | Disposition: A | Discharge status: Home / Self Care | Payer: Medicare PPO | Attending: Geriatric Medicine | Admitting: Geriatric Medicine

## 2015-03-10 ENCOUNTER — Ambulatory Visit: Payer: Medicare PPO | Admitting: Geriatric Medicine

## 2015-03-10 VITALS — BP 118/76 | HR 58 | Temp 98.2°F | Resp 16 | Ht 65.5 in | Wt 170.0 lb

## 2015-03-10 DIAGNOSIS — Z13220 Encounter for screening for lipoid disorders: Secondary | ICD-10-CM

## 2015-03-10 DIAGNOSIS — Z23 Encounter for immunization: Secondary | ICD-10-CM

## 2015-03-10 DIAGNOSIS — I10 Essential (primary) hypertension: Secondary | ICD-10-CM

## 2015-03-10 DIAGNOSIS — Z0000 Encounter for general adult medical examination without abnormal findings: Secondary | ICD-10-CM

## 2015-03-10 DIAGNOSIS — Z131 Encounter for screening for diabetes mellitus: Secondary | ICD-10-CM | POA: Insufficient documentation

## 2015-03-10 LAB — COMPREHENSIVE METABOLIC PROFILE - BMC/JMC ONLY
ALBUMIN/GLOBULIN RATIO: 2
ALBUMIN: 4.4 g/dL (ref 3.2–5.0)
ALKALINE PHOSPHATASE: 63 IU/L (ref 35–120)
ALT (SGPT): 18 IU/L (ref 0–55)
ANION GAP: 9 mmol/L (ref 3–11)
AST (SGOT): 22 IU/L (ref 0–45)
BILIRUBIN, TOTAL: 0.8 mg/dL (ref 0.0–1.3)
BUN: 10 mg/dL (ref 6–22)
CALCIUM: 9.6 mg/dL (ref 8.5–10.5)
CARBON DIOXIDE: 28 mmol/L (ref 22–32)
CHLORIDE: 105 mmol/L (ref 101–111)
CREATININE: 0.98 mg/dL (ref 0.53–1.00)
ESTIMATED GLOMERULAR FILTRATION RATE: >60 mL/min (ref >60)
GLUCOSE: 97 mg/dL (ref 70–110)
POTASSIUM: 4.7 mmol/L (ref 3.5–5.0)
SODIUM: 142 mmol/L (ref 136–145)
TOTAL PROTEIN: 6.6 g/dL (ref 6.0–8.0)

## 2015-03-10 LAB — LIPID PANEL
CHOL/HDL RATIO: 3
CHOLESTEROL: 196 mg/dL (ref 120–199)
HDL-CHOLESTEROL: 66 mg/dL (ref >39)
LDL (CALCULATED): 110 mg/dL (ref <130)
TRIGLYCERIDES: 101 mg/dL (ref <150)
VLDL (CALCULATED): 20 mg/dL (ref 5–35)

## 2015-03-10 NOTE — Progress Notes (Signed)
Subjective:     Patient ID:  Carrie Escobar is an 69 y.o. female     Chief Complaint:    Chief Complaint   Patient presents with    Physical       HPI  WELL WOMAN CPE HPI  Carrie Escobar is a 69 y.o. female here for a comprehensive physical exam. The patient reports problems - hx of HTN, stable, taking meds as were rxed. She is does exercise daily w/o CP/SOB/dizziness.   LDL 139 on 01/2014  FBG 121 on 12/2014  Depression Screen:  1. During the past month, have you often been bothered by feeling down, depressed, or hopeless? no  2. During the past month, have you often been bothered by little interest or pleasure in doing things? no    OB/Gyn History:  Patient receives gynecological care outside our clinic: no.  Date of last pelvic exam: 1999, s/p TAH  Date of last mammogram: 08/2014, normal  Date of last DEXA scan: 01/2014, normal    Do you take any herbs or supplements that were not prescribed by a doctor? no  Are you taking any calcium supplements? yes  Are you taking aspirin daily?no  Are you interested in HIV screening? no  Have you had the "Gardasil" cervical cancer vaccination? not applicable    Last Tetanus vaccine: > 5 years  Last Colonoscopy: 01/19/2015  Last Influenza Vaccine: declined  Last Pneumonia vaccine:declined  Last Shingle vaccine: declined    Functional/Nutritional Assessment (see questionnaire completed by nursing and reviewed by provider:yes    Past Medical History  Current Outpatient Prescriptions   Medication Sig    amLODIPine (NORVASC) 5 mg Oral Tablet Take 1 Tab (5 mg total) by mouth Once a day    bisoprolol-hydrochlorothiazide (ZIAC) 10-6.25 mg Oral Tablet TAKE 1 TABLET DAILY    calcium citrate-vitamin D3 (CITRACAL) 200 mg calcium -250 unit Oral Tablet Take 1 Tab by mouth Once a day    Flaxseed Oil 1,000 mg Oral Capsule Take by mouth    multivitamin Oral Tablet Take 1 Tab by mouth Once a day    timolol maleate (TIMOPTIC) 0.25 % Ophthalmic Drops Instill 1 Drop into  right eye Twice daily     Allergies   Allergen Reactions    Amoxicillin Rash     Past Medical History   Diagnosis Date    Breast mass, right      benign    HTN (hypertension)     Cataract      bilat    Pulmonary emboli      only once in 1999 after her hysterectomy    Family history of colon cancer     Nausea with vomiting      sister with PONV    Wears glasses      reading    Heartburn     Obesity          Past Surgical History   Procedure Laterality Date    Hx hysterectomy  1999     fibroid    Hx rotator cuff repair  11/04/2013     right shoulder    Hx cataract removal  10/2013     left         Family History   Problem Relation Age of Onset    Stroke Mother     Stroke Father     Cancer Sister      ovaian    Hypertension Mother     Cancer  Mother      colon         History     Social History    Marital Status: Divorced     Spouse Name: N/A    Number of Children: 0    Years of Education: N/A     Occupational History          Social History Main Topics    Smoking status: Never Smoker     Smokeless tobacco: Never Used    Alcohol Use: No    Drug Use: No    Sexual Activity: Not on file     Other Topics Concern    Abuse/Domestic Violence No    Drives Yes    Special Diet No    Uses Cane No    Uses Walker No    Uses Wheelchair No    Right Hand Dominant Yes    Left Hand Dominant No    Ability To Walk 1 Flight Of Steps Without Sob/Cp Yes    Routine Exercise Yes     Zumba class    Ability To Walk 2 Flight Of Steps Without Sob/Cp Yes    Ability To Do Own Adl's Yes     Social History Narrative         Review of Systems  Constitutional: Negative.    HENT: Negative.    Eyes: Negative.    Respiratory: Negative.    Cardiovascular: Negative.    Gastrointestinal: Negative.    Genitourinary: Negative.    Musculoskeletal: Negative.    Skin: rash everywhere scattered.   Neurological: Negative.    Endo/Heme/Allergies: Negative.    Psychiatric/Behavioral: Negative.    All other systems reviewed and are  negative.    Objective:     BP 118/76 mmHg   Pulse 58   Temp(Src) 36.8 C (98.2 F)   Resp 16   Ht 1.664 m (5' 5.5")   Wt 77.111 kg (170 lb)   BMI 27.85 kg/m2   SpO2 97%     Physical Exam  Constitutional: SHe is oriented to person, place, and time. SHe appears well-developed and well-nourished.   HENT:   Head: Normocephalic and atraumatic.   Right Ear: Hearing, tympanic membrane, external ear and ear canal normal.   Left Ear: Hearing, tympanic membrane and external ear normal.   Nose: Nose normal.   Mouth/Throat: Uvula is midline, oropharynx is clear and moist and mucous membranes are normal. No oropharyngeal exudate.   Eyes: Conjunctivae, EOM and lids are normal. No scleral icterus.   Neck: Normal range of motion and full passive range of motion without pain. Neck supple. No JVD present. Carotid bruit is not present. No tracheal deviation present. No mass and no thyromegaly present.   Cardiovascular: Normal rate, regular rhythm, normal heart sounds and intact distal pulses.    Pulm:  Effort normal and breath sounds normal.  Observations:  no respiratory distress and no stridor.  The chest exhibits no tenderness.    Breast exam:    Right normal breast;  with no masses, skin changes or nipple discharge.  Left normal breast;  with no masses, skin changes or nipple discharge.  The patient has no inverted nipple and no nipple discharge to the right breast.  The patient has  no inverted nipple, no nipple discharge and no nipple scaling to the left breast.  No mass within the left breast.  No mass within the right breast.  No tenderness to right breast.  No right axillary lymph  nodes are palpable.  No left axillary lymph nodes are palpable.    Abdomen:   Normal appearance and bowel sounds are normal. SHe exhibits no distension. Soft. There is no hepatosplenomegaly. No tenderness. SHe has no rigidity, no rebound and no guarding.   Waist Circumference: > 35 inch  Ortho/Musculoskeletal:   Normal range of motion. SHe exhibits  no edema and no tenderness.   Lymphadenopathy:        Head (right side): No submental, no submandibular, no tonsillar, no preauricular, no posterior auricular and no occipital adenopathy present.        Head (left side): No submental, no submandibular, no tonsillar, no preauricular, no posterior auricular and no occipital adenopathy present.     SHe has no cervical adenopathy.     SHe has no axillary adenopathy.        Right: No inguinal and no supraclavicular adenopathy present.        Left: No inguinal and no supraclavicular adenopathy present.   Neurological: SHe is alert and oriented to person, place, and time. SHe has normal strength and normal reflexes. SHe displays no atrophy, no tremor and normal reflexes. No cranial nerve deficit or sensory deficit. SHe exhibits normal muscle tone. SHe displays a negative Romberg sign. Coordination and gait normal. GCS eye subscore is 4. GCS verbal subscore is 5. GCS motor subscore is 6.   Skin: Skin is warm. Diffuse hypopigmented flat patches  rash noted. No cyanosis or erythema. Nails show no clubbing.   Psychiatric: SHe has a normal mood and affect. Her speech is normal and behavior is normal. Judgment and thought content normal. Cognition and memory are normal.   Nursing note and vitals reviewed.      Ortho Exam    Assessment & Plan:       ICD-10-CM    1. Routine general medical examination at a health care facility Z00.00 weight loss, calcium/vitamin D, breast cancer screening, healthy diet and exercise, influnenza vaccination, pneumococcal vaccine (>18 years old or high risk patient), cholesterol, aspirin as primary prevention foe CAD (>45, HTN, DM, Smoker), immunization, safety at work/while driving and Living Will/Advance directive were discussed in details      LIPID PANEL     HGA1C (HEMOGLOBIN A1C WITH EST AVG GLUCOSE)     COMPREHENSIVE METABOLIC PROFILE - [facility] ONLY   2. Screening, lipid Z13.220 LIPID PANEL   3. Screening for diabetes mellitus Z13.1 HGA1C  (HEMOGLOBIN A1C WITH EST AVG GLUCOSE)     COMPREHENSIVE METABOLIC PROFILE - [facility] ONLY   4. Essential hypertension I10 COMPREHENSIVE METABOLIC PROFILE - [facility] ONLY   5. Need for Tdap vaccination Z23 The need for vaccine was explained in details and pt agreed to go to local pharmacy or Health Department to have it done.

## 2015-03-10 NOTE — Progress Notes (Signed)
Patient is here today for her annual [place], [place]

## 2015-03-11 ENCOUNTER — Ambulatory Visit (INDEPENDENT_AMBULATORY_CARE_PROVIDER_SITE_OTHER): Payer: No Typology Code available for payment source | Admitting: Family Medicine

## 2015-03-11 ENCOUNTER — Ambulatory Visit (INDEPENDENT_AMBULATORY_CARE_PROVIDER_SITE_OTHER): Payer: No Typology Code available for payment source | Attending: Family Medicine

## 2015-03-11 ENCOUNTER — Encounter (INDEPENDENT_AMBULATORY_CARE_PROVIDER_SITE_OTHER): Payer: Self-pay

## 2015-03-11 VITALS — BP 153/76 | HR 67 | Temp 97.9°F | Resp 18 | Ht 65.0 in | Wt 170.0 lb

## 2015-03-11 DIAGNOSIS — S8012XA Contusion of left lower leg, initial encounter: Secondary | ICD-10-CM

## 2015-03-11 DIAGNOSIS — S80812A Abrasion, left lower leg, initial encounter: Secondary | ICD-10-CM

## 2015-03-11 LAB — HGA1C (HEMOGLOBIN A1C WITH EST AVG GLUCOSE)
ESTIMATED AVERAGE GLUCOSE: 123 mg/dL — ABNORMAL HIGH (ref 70–110)
GLYCOHEMOGLOBIN: 5.9 % (ref 4.0–6.0)

## 2015-03-11 NOTE — Patient Instructions (Addendum)
Abrasions  Abrasions are skin scrapes. Their treatment depends on how large and deep the abrasion is.  Home care  You may be prescribed an antibiotic cream or ointment to apply to the wound. This helps prevent infection. Follow instructions when using this medication.  General care   To care for the abrasion, do the following each day for as long as directed by your health care provider.   If you were given a bandage, change it once a day. If your bandage sticks to the wound, soak it in warm water until it loosens.   Wash the area with soap and warm water. You may do this in a sink or under a tub faucet or shower. Rinse off the soap. Then pat the area dry with a clean towel.   If antibiotic ointment or cream was prescribed, reapply it to the wound as directed. Cover the wound with a fresh non-stick bandage. If the bandage becomes wet or dirty, change it as soon as possible.   You may use acetaminophen or ibuprofen to control pain unless another pain medication was prescribed. Note:If you have chronic liver or kidney disease or ever had a stomach ulcer or GI bleeding, talk with your health care provider before using these medications. Do not use ibuprofen in children under six months of age.   Most skin wounds heal within ten days. But an infection may occur despite treatment. Therefore, monitor the wound for signs of infection as listed below.  Follow-up care  Follow up with your health care provider, or as advised.  When to seek medical advice  Call your health care provider right away if any of these occur:   Fever of 101F (38.3C) or higher, or as directed by your health care provider   Increasing pain, redness, swelling, or drainage from the wound   Bleeding from the wound that does not stop after a few minutes of steady, firm pressure   Decreased ability to move any body part near wound   2000-2015 [employer] [address], [place] [address]. All rights reserved. This  information is not intended as a substitute for professional medical care. Always follow your healthcare professional's instructions.        Hematoma  A hematoma is caused by an injury with damage to small blood vessels. This causes blood to leak into the tissues. Blood forms a pocket under the skin that swells and looks like a purplish patch.  Gradually the blood in the hematoma is absorbed back into the body. The swelling and pain of the hematoma will go away. This takes from one to four weeks, depending on the size of the hematoma. The skin over the hematoma may turn bluish then brown and yellow as the blood is dissolved and absorbed.  Home Care:   Limit motion of the joints near the hematoma. If the hematoma is large and painful, you should avoid sports and other vigorous physical activity until the swelling and pain goes away.   Apply an ice pack (ice cubes in a plastic bag, wrapped in a towel) over the injured area for 20 minutes every 1-2 hours the first day. You should continue with ice packs 3-4 times a day for the next two days. Continue the use of ice packs for relief of pain and swelling as needed.   You may use acetaminophen (Tylenol) or ibuprofen (Motrin, Advil) to control pain, unless another pain medicine was prescribed. [ NOTE : If you have   chronic liver or kidney disease or ever had a stomach ulcer or GI bleeding, talk with your doctor before using these medicines.]  Follow Up  with your doctor or as advised by our staff.  [ NOTE: A radiologist will review any X-rays that were taken. We will notify you of any new findings that may affect your care.]  Get Prompt Medical Attention  if any of the following occur:   Redness around the hematoma   Increase in pain or warmth in the hematoma   Increase in size of the hematoma   Fever of 100.4F (38C) or higher, or as directed by your healthcare provider   If the hematoma is on the arm or leg, watch for:   Increased swelling or pain in the  extremity   Numbness or tingling or blue color of the hand or foot   2000-2015 [employer] [address], [place] [address]. All rights reserved. This information is not intended as a substitute for professional medical care. Always follow your healthcare professional's instructions.

## 2015-03-11 NOTE — Progress Notes (Signed)
Subjective:    Patient ID: Amy Christensen is a 69 y.o. female.    HPI Comments: While mowing grass, stone flew and hit on left leg    Foot Injury   The incident occurred 1 to 3 hours ago. The incident occurred at home. The pain is present in the left leg. The quality of the pain is described as aching. The pain is moderate. Pertinent negatives include no loss of motion, loss of sensation, muscle weakness, numbness or tingling. The symptoms are aggravated by palpation.   TDAP-- 5 YEARS AGO      The following portions of the patient's history were reviewed and updated as appropriate: allergies, current medications, past family history, past medical history, past social history, past surgical history and problem list.    Review of Systems   Constitutional: Negative for fever, chills, diaphoresis, activity change, appetite change, fatigue and unexpected weight change.   Musculoskeletal: Positive for myalgias and arthralgias.   Skin: Positive for wound (ABRASION).   Neurological: Negative for tingling and numbness.         Objective:    BP 153/76 mmHg  Pulse 67  Temp(Src) 97.9 F (36.6 C) (Oral)  Resp 18  Ht 1.651 m (5\' 5" )  Wt 77.111 kg (170 lb)  BMI 28.29 kg/m2    Physical Exam   Constitutional: She is oriented to person, place, and time. She appears well-developed and well-nourished. No distress.   Neurological: She is alert and oriented to person, place, and time.   Skin: Skin is warm. Abrasion (LEFT SHIN AREA) and lesion (HEMATOMA,ON LEFT SHIN AREA, TENDER TO TOUCH,ABRASION OF SKIN ON TOP.) noted. She is not diaphoretic.        Psychiatric: She has a normal mood and affect.         Assessment and Plan:       Amy Christensen was seen today for leg injury.    Diagnoses and all orders for this visit:    Hematoma of leg, left, initial encounter  Orders:  -     X-ray tibia fibula left AP and lateral      1. Hematoma of leg, left, initial encounter    2. Abrasion of anterior left lower leg, initial encounter         TYLENOL/IBUPROFEN PRN FOR PAIN    Advise to watch for worsening symptoms,with those seek medical help immediately  F/U PRN  F/U WITH ORTHO/PCP IF NOT BETTER IN 4-5 DAYS  REST,ICE,COMPRESSION ,ELEVATION ADVISED  OTC ANTIBIOTIC OINTMENT LOCALLY, WATCH FOR SIGNS OF INFECTION    Etheleen Sia, MD  [facility]  03/11/2015  7:12 PM

## 2015-03-14 ENCOUNTER — Telehealth (INDEPENDENT_AMBULATORY_CARE_PROVIDER_SITE_OTHER): Payer: Self-pay

## 2015-03-14 NOTE — Telephone Encounter (Signed)
Called and spoke to patients  Much better. States that child is feeling much better. Let parent know to call us back if any questions or concerns come up.

## 2015-03-17 ENCOUNTER — Ambulatory Visit: Payer: Medicare PPO | Admitting: Geriatric Medicine

## 2015-03-17 VITALS — BP 140/78 | HR 61 | Temp 98.4°F | Resp 14 | Ht 65.0 in | Wt 170.0 lb

## 2015-03-17 DIAGNOSIS — L02419 Cutaneous abscess of limb, unspecified: Principal | ICD-10-CM

## 2015-03-17 DIAGNOSIS — L03119 Cellulitis of unspecified part of limb: Secondary | ICD-10-CM

## 2015-03-17 MED ORDER — SULFAMETHOXAZOLE 800 MG-TRIMETHOPRIM 160 MG TABLET
1.00 | ORAL_TABLET | Freq: Two times a day (BID) | ORAL | Status: DC
Start: 2015-03-17 — End: 2015-05-18

## 2015-03-18 ENCOUNTER — Encounter: Payer: Self-pay | Admitting: Geriatric Medicine

## 2015-03-18 NOTE — Progress Notes (Signed)
Subjective:     Patient ID:  Carrie Escobar is an 69 y.o. female     Chief Complaint:    Chief Complaint   Patient presents with    Leg Injury       Patient is a 69 y.o. female presenting with lower extremity injury. The history is provided by the patient and medical records. No language interpreter was used.   Leg Injury   The incident occurred more than 1 week ago. The incident occurred in the yard. The injury mechanism was a direct blow (hit her left shin in a rock while was mowing her backyard grace). The pain is present in the left leg. The pain is at a severity of 4/10. The pain has been constant since onset. Pertinent negatives include no inability to bear weight, loss of motion, loss of sensation, muscle weakness, numbness or tingling. She reports no foreign bodies present. The symptoms are aggravated by palpation and movement. Treatments tried: she went to urgent care , xray was negative for Fx, she wa told to apply cold compreses and was sent home. The treatment provided no relief.       Past Medical History  Current Outpatient Prescriptions   Medication Sig    amLODIPine (NORVASC) 5 mg Oral Tablet Take 1 Tab (5 mg total) by mouth Once a day    bisoprolol-hydrochlorothiazide (ZIAC) 10-6.25 mg Oral Tablet TAKE 1 TABLET DAILY    calcium citrate-vitamin D3 (CITRACAL) 200 mg calcium -250 unit Oral Tablet Take 1 Tab by mouth Once a day    Flaxseed Oil 1,000 mg Oral Capsule Take by mouth    multivitamin Oral Tablet Take 1 Tab by mouth Once a day    timolol maleate (TIMOPTIC) 0.25 % Ophthalmic Drops Instill 1 Drop into right eye Twice daily    trimethoprim-sulfamethoxazole (BACTRIM DS) 800-160 mg Oral Tablet Take 1 Tab (160 mg total) by mouth Every 12 hours     Allergies   Allergen Reactions    Amoxicillin Rash     Past Medical History   Diagnosis Date    Breast mass, right      benign    HTN (hypertension)     Cataract      bilat    Pulmonary emboli      only once in 1999 after her  hysterectomy    Family history of colon cancer     Nausea with vomiting      sister with PONV    Wears glasses      reading    Heartburn     Obesity          Past Surgical History   Procedure Laterality Date    Hx hysterectomy  1999     fibroid    Hx rotator cuff repair  11/04/2013     right shoulder    Hx cataract removal  10/2013     left         Family History   Problem Relation Age of Onset    Stroke Mother     Stroke Father     Cancer Sister      ovaian    Hypertension Mother     Cancer Mother      colon         History     Social History    Marital Status: Divorced     Spouse Name: N/A    Number of Children: 0    Years of Education:  N/A     Occupational History          Social History Main Topics    Smoking status: Never Smoker     Smokeless tobacco: Never Used    Alcohol Use: No    Drug Use: No    Sexual Activity: Not on file     Other Topics Concern    Abuse/Domestic Violence No    Drives Yes    Special Diet No    Uses Cane No    Uses Walker No    Uses Wheelchair No    Right Hand Dominant Yes    Left Hand Dominant No    Ability To Walk 1 Flight Of Steps Without Sob/Cp Yes    Routine Exercise Yes     Zumba class    Ability To Walk 2 Flight Of Steps Without Sob/Cp Yes    Ability To Do Own Adl's Yes     Social History Narrative         Review of Systems   Constitutional: Negative.    Respiratory: Negative.    Cardiovascular: Negative.    Gastrointestinal: Negative.    Musculoskeletal: Positive for myalgias and gait problem.   Skin: Positive for wound.   Neurological: Negative for tingling and numbness.   Hematological: Negative.        Objective:     BP 140/78 mmHg   Pulse 61   Temp(Src) 36.9 C (98.4 F)   Resp 14   Ht 1.651 m (5\' 5" )   Wt 77.111 kg (170 lb)   BMI 28.29 kg/m2   SpO2 98%     Physical Exam   Constitutional: She is oriented to person, place, and time. She appears well-developed and well-nourished.   HENT:   Head: Normocephalic and atraumatic.   Right Ear:  External ear normal.   Left Ear: External ear normal.   Eyes: No scleral icterus.   Pulmonary/Chest: Effort normal.   Musculoskeletal: She exhibits tenderness (mild left shin).   Neurological: She is alert and oriented to person, place, and time.   Skin: Abrasion and rash noted. There is erythema. No cyanosis. Nails show no clubbing.                Ortho Exam    Assessment & Plan:       ICD-10-CM    1. Cellulitis and abscess of leg L02.419 trimethoprim-sulfamethoxazole (BACTRIM DS) 800-160 mg Oral Tablet    L03.119      Warm compresses  RTC PRN

## 2015-04-06 ENCOUNTER — Ambulatory Visit: Payer: Medicare PPO | Admitting: Geriatric Medicine

## 2015-04-06 VITALS — Resp 14

## 2015-04-06 DIAGNOSIS — M79605 Pain in left leg: Secondary | ICD-10-CM

## 2015-04-06 DIAGNOSIS — S81802D Unspecified open wound, left lower leg, subsequent encounter: Secondary | ICD-10-CM

## 2015-04-06 NOTE — Progress Notes (Signed)
Subjective:     Patient ID:  Carrie Escobar is an 69 y.o. female     Chief Complaint:    Chief Complaint   Patient presents with    Left Leg Pain       HPI  The pt with hx of left mid shin injury, cellulitis, treated with bactrim, presented today with c/o left lower leg pain and intermittent cramp. No fever/chills.   Past Medical History  Current Outpatient Prescriptions   Medication Sig    amLODIPine (NORVASC) 5 mg Oral Tablet Take 1 Tab (5 mg total) by mouth Once a day    bisoprolol-hydrochlorothiazide (ZIAC) 10-6.25 mg Oral Tablet TAKE 1 TABLET DAILY    calcium citrate-vitamin D3 (CITRACAL) 200 mg calcium -250 unit Oral Tablet Take 1 Tab by mouth Once a day    Flaxseed Oil 1,000 mg Oral Capsule Take by mouth    multivitamin Oral Tablet Take 1 Tab by mouth Once a day    timolol maleate (TIMOPTIC) 0.25 % Ophthalmic Drops Instill 1 Drop into right eye Twice daily    trimethoprim-sulfamethoxazole (BACTRIM DS) 800-160 mg Oral Tablet Take 1 Tab (160 mg total) by mouth Every 12 hours     Allergies   Allergen Reactions    Amoxicillin Rash     Past Medical History   Diagnosis Date    Breast mass, right      benign    HTN (hypertension)     Cataract      bilat    Pulmonary emboli      only once in 1999 after her hysterectomy    Family history of colon cancer     Nausea with vomiting      sister with PONV    Wears glasses      reading    Heartburn     Obesity          Past Surgical History   Procedure Laterality Date    Hx hysterectomy  1999     fibroid    Hx rotator cuff repair  11/04/2013     right shoulder    Hx cataract removal  10/2013     left         Family History   Problem Relation Age of Onset    Stroke Mother     Stroke Father     Cancer Sister      ovaian    Hypertension Mother     Cancer Mother      colon         History     Social History    Marital Status: Divorced     Spouse Name: N/A    Number of Children: 0    Years of Education: N/A     Occupational History               Social History Main Topics    Smoking status: Never Smoker     Smokeless tobacco: Never Used    Alcohol Use: No    Drug Use: No    Sexual Activity: Not on file     Other Topics Concern    Abuse/Domestic Violence No    Drives Yes    Special Diet No    Uses Cane No    Uses Walker No    Uses Wheelchair No    Right Hand Dominant Yes    Left Hand Dominant No    Ability To Walk 1 Flight Of Steps Without  Sob/Cp Yes    Routine Exercise Yes     Zumba class    Ability To Walk 2 Flight Of Steps Without Sob/Cp Yes    Ability To Do Own Adl's Yes     Social History Narrative         Review of Systems   Constitutional: Negative.    HENT: Negative.    Musculoskeletal: Positive for myalgias.   Skin: Positive for wound.   Hematological: Negative.        Objective:     Resp 14     Physical Exam   Constitutional: She is oriented to person, place, and time. She appears well-developed and well-nourished.   HENT:   Head: Normocephalic and atraumatic.   Right Ear: External ear normal.   Left Ear: External ear normal.   Pulmonary/Chest: Effort normal.   Musculoskeletal:        Left lower leg: She exhibits tenderness, bony tenderness and swelling. She exhibits no edema, no deformity and no laceration.        Legs:  Neurological: She is alert and oriented to person, place, and time. Coordination normal.   Nursing note and vitals reviewed.  no calf tenderness.         Ortho Exam    Assessment & Plan:       ICD-10-CM    1. Pain of left lower extremity M79.605    2. Wound, open, leg, left, subsequent encounter S81.802D    cellulitis  Has resolved but wound is tender with scabbed with necrotic tissue, debridement was done, no oozing or pus, wound was dressed with sterile dressing.  Keep the wound clean and dry.

## 2015-05-02 ENCOUNTER — Other Ambulatory Visit: Payer: Self-pay | Admitting: Geriatric Medicine

## 2015-05-08 ENCOUNTER — Other Ambulatory Visit: Payer: Self-pay | Admitting: Geriatric Medicine

## 2015-05-18 ENCOUNTER — Ambulatory Visit (INDEPENDENT_AMBULATORY_CARE_PROVIDER_SITE_OTHER): Payer: Medicare PPO | Admitting: Geriatric Medicine

## 2015-05-18 ENCOUNTER — Encounter: Payer: Self-pay | Admitting: Geriatric Medicine

## 2015-05-18 VITALS — BP 110/62 | HR 55 | Temp 96.9°F | Resp 14 | Ht 65.5 in | Wt 172.0 lb

## 2015-05-18 DIAGNOSIS — M79605 Pain in left leg: Secondary | ICD-10-CM

## 2015-05-18 NOTE — Progress Notes (Signed)
Patient is here for left leg injury about 2 months ago that is not healing Carrie Escobar, CMA

## 2015-05-18 NOTE — Progress Notes (Signed)
Subjective:     Patient ID:  Carrie Escobar is an 69 y.o. female     Chief Complaint:    Chief Complaint   Patient presents with    Leg Injury     2 months       HPI  The pt with hx of left mid shin injury, cellulitis, treated with bactrim, presented today with c/o left lower leg pain and intermittent cramp. No fever/chills.  Her left shin wound is clean w/o oozing but not healing completely, she feels slightly painful and tender over the wound.   Past Medical History  Current Outpatient Prescriptions   Medication Sig    amLODIPine (NORVASC) 5 mg Oral Tablet TAKE 1 TABLET DAILY    bisoprolol-hydroCHLOROthiazide (ZIAC) 10-6.25 mg Oral Tablet TAKE 1 TABLET DAILY    calcium citrate-vitamin D3 (CITRACAL) 200 mg calcium -250 unit Oral Tablet Take 1 Tab by mouth Once a day    Flaxseed Oil 1,000 mg Oral Capsule Take by mouth    multivitamin Oral Tablet Take 1 Tab by mouth Once a day    timolol maleate (TIMOPTIC) 0.25 % Ophthalmic Drops Instill 1 Drop into right eye Twice daily     Allergies   Allergen Reactions    Amoxicillin Rash     Past Medical History   Diagnosis Date    Breast mass, right      benign    HTN (hypertension)     Cataract      bilat    Pulmonary emboli      only once in 1999 after her hysterectomy    Family history of colon cancer     Nausea with vomiting      sister with PONV    Wears glasses      reading    Heartburn     Obesity          Past Surgical History   Procedure Laterality Date    Hx hysterectomy  1999     fibroid    Hx rotator cuff repair  11/04/2013     right shoulder    Hx cataract removal  10/2013     left         Family History   Problem Relation Age of Onset    Stroke Mother     Stroke Father     Cancer Sister      ovaian    Hypertension Mother     Cancer Mother      colon         History     Social History    Marital Status: Divorced     Spouse Name: N/A    Number of Children: 0    Years of Education: N/A     Occupational History          Social  History Main Topics    Smoking status: Never Smoker     Smokeless tobacco: Never Used    Alcohol Use: No    Drug Use: No    Sexual Activity: Not on file     Other Topics Concern    Abuse/Domestic Violence No    Drives Yes    Special Diet No    Uses Cane No    Uses Walker No    Uses Wheelchair No    Right Hand Dominant Yes    Left Hand Dominant No    Ability To Walk 1 Flight Of Steps Without Sob/Cp Yes  Routine Exercise Yes     Zumba class    Ability To Walk 2 Flight Of Steps Without Sob/Cp Yes    Ability To Do Own Adl's Yes     Social History Narrative         Review of Systems  Constitutional: Negative.   HENT: Negative.   Musculoskeletal: Positive for myalgias.   Skin: Positive for wound.   Hematological: Negative.  Objective:     BP 110/62 mmHg   Pulse 55   Temp(Src) 36.1 C (96.9 F) (Tympanic)   Resp 14   Ht 1.664 m (5' 5.5")   Wt 78.019 kg (172 lb)   BMI 28.18 kg/m2   SpO2 97%     Physical Exam  Constitutional: She is oriented to person, place, and time. She appears well-developed and well-nourished.   HENT:   Head: Normocephalic and atraumatic.   Right Ear: External ear normal.   Left Ear: External ear normal.   Pulmonary/Chest: Effort normal.   Musculoskeletal:    Left lower leg: She exhibits tenderness, bony tenderness and swelling. She exhibits no edema, no deformity and no laceration.   left mid per tibial area with clean, slowly healing wound, size max 0.5 cm, mildly tender to touch, no oozing with attachment to under tissue at 10-12 o clock.   Neurological: She is alert and oriented to person, place, and time. Coordination normal.   Nursing note and vitals reviewed.  no calf tenderness.       Ortho Exam    Assessment & Plan:       ICD-10-CM    1. Pain of left lower extremity M79.605    will observe at this time, warm compresses, massage the area, it is most likely from scare tissue under the wound, if not better will refer to derm

## 2015-07-31 ENCOUNTER — Other Ambulatory Visit: Payer: Self-pay | Admitting: Geriatric Medicine

## 2015-07-31 ENCOUNTER — Encounter (HOSPITAL_BASED_OUTPATIENT_CLINIC_OR_DEPARTMENT_OTHER): Payer: Self-pay

## 2015-08-21 ENCOUNTER — Other Ambulatory Visit: Payer: Self-pay | Admitting: Geriatric Medicine

## 2015-08-21 DIAGNOSIS — Z9889 Other specified postprocedural states: Secondary | ICD-10-CM

## 2015-08-21 MED ORDER — BISOPROLOL 10 MG-HYDROCHLOROTHIAZIDE 6.25 MG TABLET
ORAL_TABLET | ORAL | Status: DC
Start: 2015-08-21 — End: 2015-11-20

## 2015-08-21 NOTE — Telephone Encounter (Signed)
Please review and sign, pt needs refill asap. Carrie Escobar

## 2015-08-24 ENCOUNTER — Telehealth: Payer: Self-pay | Admitting: Geriatric Medicine

## 2015-08-24 NOTE — Telephone Encounter (Signed)
-----   Message from Kayleen Memos, MD sent at 08/21/2015  4:54 PM EST -----  Contact: [phone]  It was ordered  ----- Message -----     From: Starr Sinclair     Sent: 08/21/2015  11:02 AM       To: Kayleen Memos, MD    Patient states that it is time for her to have her mammogram, ok to ordeR?

## 2015-08-24 NOTE — Telephone Encounter (Signed)
Patient notified, she will call and schedule mammo 08/21/15. Starr Sinclair

## 2015-09-01 ENCOUNTER — Other Ambulatory Visit: Payer: Self-pay | Admitting: Internal Medicine

## 2015-09-01 DIAGNOSIS — Z1231 Encounter for screening mammogram for malignant neoplasm of breast: Secondary | ICD-10-CM

## 2015-09-02 ENCOUNTER — Encounter (INDEPENDENT_AMBULATORY_CARE_PROVIDER_SITE_OTHER): Payer: Medicare PPO | Admitting: Geriatric Medicine

## 2015-09-11 ENCOUNTER — Ambulatory Visit (INDEPENDENT_AMBULATORY_CARE_PROVIDER_SITE_OTHER): Payer: Medicare PPO | Admitting: Geriatric Medicine

## 2015-09-11 ENCOUNTER — Ambulatory Visit (HOSPITAL_BASED_OUTPATIENT_CLINIC_OR_DEPARTMENT_OTHER): Payer: Medicare PPO | Attending: Geriatric Medicine

## 2015-09-11 ENCOUNTER — Encounter (INDEPENDENT_AMBULATORY_CARE_PROVIDER_SITE_OTHER): Payer: Self-pay | Admitting: Geriatric Medicine

## 2015-09-11 VITALS — BP 160/90 | HR 49 | Temp 96.6°F | Resp 14 | Ht 65.5 in | Wt 163.0 lb

## 2015-09-11 DIAGNOSIS — I10 Essential (primary) hypertension: Secondary | ICD-10-CM

## 2015-09-11 DIAGNOSIS — Z6826 Body mass index (BMI) 26.0-26.9, adult: Secondary | ICD-10-CM

## 2015-09-11 LAB — RENAL FUNCTION PANEL
ALBUMIN: 4.2 g/dL (ref 3.5–5.0)
ANION GAP: 10 mmol/L (ref 3–11)
BUN/CREA RATIO: 11 (ref 6–22)
BUN: 11 mg/dL (ref 6–20)
CALCIUM: 9.6 mg/dL (ref 8.8–10.2)
CHLORIDE: 100 mmol/L — ABNORMAL LOW (ref 101–111)
CO2 TOTAL: 29 mmol/L (ref 22–32)
CREATININE: 0.97 mg/dL (ref 0.44–1.00)
ESTIMATED GFR: >60 mL/min/1.73mˆ2 (ref >60)
GLUCOSE: 122 mg/dL — ABNORMAL HIGH (ref 70–110)
PHOSPHORUS: 2.8 mg/dL (ref 2.7–4.5)
PHOSPHORUS: 2.8 mg/dL (ref 2.7–4.5)
POTASSIUM: 3.5 mmol/L (ref 3.4–5.1)
SODIUM: 139 mmol/L (ref 136–145)

## 2015-09-11 NOTE — Progress Notes (Signed)
Patient is here today for a general follow up. Patient states that she does need any refills on any medications at this time, she also states that she is doing well on all of her meds at this time. Carrie Escobar

## 2015-09-11 NOTE — Progress Notes (Signed)
Subjective:     Patient ID:  Carrie Escobar is an 69 y.o. female     Chief Complaint:    Chief Complaint   Patient presents with    FOLLOW UP MED CHECK       HPI    Carrie Escobar is a 69 y.o. female with hypertension.    Hypertension ROS: taking medications as instructed, no medication side effects noted, patient does not perform home BP monitoring, no TIAs, no chest pain on exertion, no dyspnea on exertion, no swelling of ankles, no orthostatic dizziness or lightheadedness, no orthopnea or paroxysmal nocturnal dyspnea, no palpitations, no intermittent claudication symptoms.   Comprehensive Metabolic Profile    Lab Results   Component Value Date/Time    SODIUM 142 03/10/2015 12:15 PM    POTASSIUM 4.7 03/10/2015 12:15 PM    CHLORIDE 105 03/10/2015 12:15 PM    CARBON DIOXIDE 28 03/10/2015 12:15 PM    ANION GAP 9 03/10/2015 12:15 PM    BUN 10 03/10/2015 12:15 PM    CREATININE 0.98 03/10/2015 12:15 PM    Lab Results   Component Value Date/Time    CALCIUM 9.6 03/10/2015 12:15 PM    ALBUMIN 4.4 03/10/2015 12:15 PM    TOTAL PROTEIN 6.6 03/10/2015 12:15 PM    ALKALINE PHOSPHATASE 63 03/10/2015 12:15 PM    AST (SGOT) 22 03/10/2015 12:15 PM    ALT (SGPT) 18 03/10/2015 12:15 PM    BILIRUBIN, TOTAL 0.8 03/10/2015 12:15 PM        Lab Results   Component Value Date    CHOLESTEROL 196 03/10/2015    HDLCHOL 66 03/10/2015    LDLCHOL 110 03/10/2015    TRIG 101* 03/10/2015        Past Medical History  Current Outpatient Prescriptions   Medication Sig    amLODIPine (NORVASC) 5 mg Oral Tablet TAKE 1 TABLET DAILY    bisoprolol-hydroCHLOROthiazide (ZIAC) 10-6.25 mg Oral Tablet TAKE 1 TABLET DAILY    calcium citrate-vitamin D3 (CITRACAL) 200 mg calcium -250 unit Oral Tablet Take 1 Tab by mouth Once a day    Flaxseed Oil 1,000 mg Oral Capsule Take by mouth    multivitamin Oral Tablet Take 1 Tab by mouth Once a day    timolol maleate (TIMOPTIC) 0.25 % Ophthalmic Drops Instill 1 Drop into right eye Twice daily          Allergies   Allergen Reactions    Amoxicillin Rash     Past Medical History   Diagnosis Date    Breast mass, right      benign    HTN (hypertension)     Cataract      bilat    Pulmonary emboli      only once in 1999 after her hysterectomy    Family history of colon cancer     Nausea with vomiting      sister with PONV    Wears glasses      reading    Heartburn     Obesity          Past Surgical History   Procedure Laterality Date    Hx hysterectomy  1999     fibroid    Hx rotator cuff repair  11/04/2013     right shoulder    Hx cataract removal  10/2013     left         Family History   Problem Relation Age of Onset    Stroke  Mother     Stroke Father     Cancer Sister      ovaian    Hypertension Mother     Cancer Mother      colon         Social History     Social History    Marital Status: Divorced     Spouse Name: N/A    Number of Children: 0    Years of Education: N/A     Occupational History          Social History Main Topics    Smoking status: Never Smoker     Smokeless tobacco: Never Used    Alcohol Use: No    Drug Use: No    Sexual Activity: Not on file     Other Topics Concern    Abuse/Domestic Violence No    Drives Yes    Special Diet No    Uses Cane No    Uses Walker No    Uses Wheelchair No    Right Hand Dominant Yes    Left Hand Dominant No    Ability To Walk 1 Flight Of Steps Without Sob/Cp Yes    Routine Exercise Yes     Zumba class    Ability To Walk 2 Flight Of Steps Without Sob/Cp Yes    Ability To Do Own Adl's Yes     Social History Narrative         Review of Systems   Constitutional: Negative.    HENT: Negative.    Respiratory: Negative.    Cardiovascular: Negative.    Gastrointestinal: Negative.    Genitourinary: Negative.    Neurological: Negative.    Hematological: Negative.        Objective:     BP 160/90 mmHg   Pulse 49   Temp(Src) 35.9 C (96.6 F) (Tympanic)   Resp 14   Ht 1.664 m (5' 5.5")   Wt 73.936 kg (163 lb)   BMI 26.70 kg/m2   SpO2 99%      Physical Exam  Appearance healthy, alert and cooperative.  General exam BP noted to be well controlled today in office, S1, S2 normal, no gallop, no murmur, chest clear, no JVD, no HSM, no edema  Neck: no carotid bruit, no thyromegaly, no thyroid mass, supple,   Abd: +BS, ND/NT, no Organomegaly, no abd bruit, no mass/hernia  Gait: WNL    Ortho Exam    Assessment & Plan:       ICD-10-CM    1. Essential hypertension I10 Assessment:    Hypertension no significant medication side effects noted, control uncertain, needs to follow diet more regularly.     Plan:   current treatment plan is effective, no change in therapy, lab results and schedule of future lab studies reviewed with patient, repeat labs ordered prior to next appointment, orders and follow up as documented in [place], reviewed diet, exercise and weight control, recommended sodium restriction, cardiovascular risk and specific lipid/LDL goals reviewed, reviewed medications and side effects in detail, reviewed potential future medication changes and side effects, use of aspirin to prevent MI and TIAs discussed.    She is going to check her BP at home and send me report for possible dose adjustment     RTC in 6 months    RENAL FUNCTION PANEL   2. BMI 26.0-26.9,adult Z68.26 Advised on Low fat/ low carb diet, weight loss, and exercise to reduce the above normal BMI.

## 2015-09-12 ENCOUNTER — Encounter: Payer: Self-pay | Admitting: Geriatric Medicine

## 2015-09-13 ENCOUNTER — Encounter (INDEPENDENT_AMBULATORY_CARE_PROVIDER_SITE_OTHER): Payer: Self-pay | Admitting: Geriatric Medicine

## 2015-09-15 ENCOUNTER — Other Ambulatory Visit: Payer: Self-pay | Admitting: Internal Medicine

## 2015-09-15 ENCOUNTER — Ambulatory Visit
Admission: RE | Admit: 2015-09-15 | Discharge: 2015-09-15 | Disposition: A | Discharge status: Home / Self Care | Payer: Medicare Other | Source: Ambulatory Visit | Attending: Internal Medicine | Admitting: Internal Medicine

## 2015-09-15 DIAGNOSIS — Z1231 Encounter for screening mammogram for malignant neoplasm of breast: Secondary | ICD-10-CM | POA: Insufficient documentation

## 2015-09-18 ENCOUNTER — Encounter (HOSPITAL_BASED_OUTPATIENT_CLINIC_OR_DEPARTMENT_OTHER): Payer: Self-pay

## 2015-09-18 ENCOUNTER — Ambulatory Visit (HOSPITAL_BASED_OUTPATIENT_CLINIC_OR_DEPARTMENT_OTHER)
Admission: RE | Admit: 2015-09-18 | Discharge: 2015-09-18 | Disposition: A | Discharge status: Home / Self Care | Payer: Medicare PPO | Source: Ambulatory Visit | Attending: Geriatric Medicine | Admitting: Geriatric Medicine

## 2015-09-18 DIAGNOSIS — Z9889 Other specified postprocedural states: Secondary | ICD-10-CM | POA: Insufficient documentation

## 2015-09-18 DIAGNOSIS — R921 Mammographic calcification found on diagnostic imaging of breast: Secondary | ICD-10-CM | POA: Insufficient documentation

## 2015-10-09 ENCOUNTER — Encounter (INDEPENDENT_AMBULATORY_CARE_PROVIDER_SITE_OTHER): Payer: Self-pay | Admitting: Geriatric Medicine

## 2015-10-09 ENCOUNTER — Ambulatory Visit (INDEPENDENT_AMBULATORY_CARE_PROVIDER_SITE_OTHER): Payer: Medicare PPO | Admitting: Geriatric Medicine

## 2015-10-09 VITALS — BP 154/86 | HR 62 | Temp 97.7°F | Resp 12 | Ht 65.0 in | Wt 166.6 lb

## 2015-10-09 DIAGNOSIS — H938X2 Other specified disorders of left ear: Secondary | ICD-10-CM

## 2015-10-09 DIAGNOSIS — Z6827 Body mass index (BMI) 27.0-27.9, adult: Secondary | ICD-10-CM

## 2015-10-09 DIAGNOSIS — R0981 Nasal congestion: Secondary | ICD-10-CM

## 2015-10-09 MED ORDER — FLUTICASONE PROPIONATE 50 MCG/ACTUATION NASAL SPRAY,SUSPENSION
2.0000 | Freq: Every day | NASAL | 1 refills | Status: DC
Start: 2015-10-09 — End: 2017-09-21

## 2015-10-09 NOTE — Progress Notes (Signed)
Pt reports today for left ear fullness x2 weeks.  Pt states the fullness is intermittent.  Pt denies pain, fever, or hearing disruptions in left ear. Pt has not used any OTC methods for her ear.  Pt has no other concerns at this time.  Mariel Aloe MA Student

## 2015-10-09 NOTE — Progress Notes (Signed)
Subjective:     Patient ID:  Carrie Escobar is an 69 y.o. female     Chief Complaint:    Chief Complaint   Patient presents with    Ear Fullness     left ear       Patient is a 69 y.o. female presenting with ear congestion. The history is provided by the patient and medical records. No language interpreter was used.   Ear Fullness   This is a new (Left side only ) problem. The current episode started more than 1 week ago (x 2 weeks). Episode frequency: Intermittent. The problem has been gradually worsening. Pertinent negatives include no chest pain, no abdominal pain, no headaches and no shortness of breath. Associated symptoms comments: No pain, fever or hearing disturbances, no chills, no discharge. She has tried nothing (N/A) for the symptoms.        Past Medical History  Current Outpatient Prescriptions   Medication Sig    amLODIPine (NORVASC) 5 mg Oral Tablet TAKE 1 TABLET DAILY    bisoprolol-hydroCHLOROthiazide (ZIAC) 10-6.25 mg Oral Tablet TAKE 1 TABLET DAILY    calcium citrate-vitamin D3 (CITRACAL) 200 mg calcium -250 unit Oral Tablet Take 1 Tab by mouth Once a day    Flaxseed Oil 1,000 mg Oral Capsule Take by mouth    multivitamin Oral Tablet Take 1 Tab by mouth Once a day    timolol maleate (TIMOPTIC) 0.25 % Ophthalmic Drops Instill 1 Drop into right eye Twice daily     Allergies   Allergen Reactions    Amoxicillin Rash     Past Medical History   Diagnosis Date    Breast mass, right      benign    Cataract      bilat    Family history of colon cancer     Heartburn     HTN (hypertension)     Nausea with vomiting      sister with PONV    Obesity     Pulmonary emboli      only once in 1999 after her hysterectomy    Wears glasses      reading         Past Surgical History   Procedure Laterality Date    Hx rotator cuff repair  11/04/2013     right shoulder    Hx cataract removal  10/2013     left    Hx breast lumpectomy Right      benign    Hx hysterectomy  1999     fibroid                  Family History   Problem Relation Age of Onset    Stroke Mother     Hypertension Mother     Cancer Mother      colon    Stroke Father     Cancer Sister      ovaian    No Known Problems Brother     Breast Cancer Maternal Grandmother     No Known Problems Maternal Grandfather     No Known Problems Paternal Grandmother     No Known Problems Paternal Grandfather     No Known Problems Daughter     No Known Problems Son     No Known Problems Maternal Aunt     No Known Problems Maternal Uncle     No Known Problems Paternal Aunt     No Known Problems Paternal Uncle  No Known Problems Other     Cervical Cancer Neg Hx     Colon Cancer Neg Hx     Liver Cancer Neg Hx     Lung Cancer Neg Hx     Lymphoma Neg Hx     Melanoma Neg Hx     Ovarian Cancer Neg Hx     Pancreatic Cancer Neg Hx     Thyroid Cancer Neg Hx     Uterine Cancer Neg Hx     Prostate Cancer Neg Hx     Leukemia Neg Hx          Social History     Social History    Marital status: Divorced     Spouse name: N/A    Number of children: 0    Years of education: N/A     Occupational History     [employer]     Social History Main Topics    Smoking status: Never Smoker    Smokeless tobacco: Never Used    Alcohol use No    Drug use: No    Sexual activity: Not on file     Other Topics Concern    Abuse/Domestic Violence No    Drives Yes    Special Diet No    Uses Cane No    Uses Walker No    Uses Wheelchair No    Right Hand Dominant Yes    Left Hand Dominant No    Ability To Walk 1 Flight Of Steps Without Sob/Cp Yes    Routine Exercise Yes     Zumba class    Ability To Walk 2 Flight Of Steps Without Sob/Cp Yes    Ability To Do Own Adl's Yes     Social History Narrative         Review of Systems   HENT: Positive for congestion. Negative for ear discharge.         Clear ear fluid    Respiratory: Negative for shortness of breath.    Cardiovascular: Negative for chest pain.   Gastrointestinal: Negative for abdominal pain.    Neurological: Negative for headaches.       Objective:     Visit Vitals    BP (!) 154/86    Pulse 62    Temp 36.5 C (97.7 F) (Tympanic)    Resp 12    Ht 1.651 m (5\' 5" )    Wt 75.6 kg (166 lb 9.6 oz)    SpO2 97%    Breastfeeding No    BMI 27.72 kg/m2        Physical Exam   Constitutional: She appears well-developed and well-nourished.   HENT:   Head: Normocephalic and atraumatic.   Right Ear: External ear normal. Tympanic membrane is bulging. Tympanic membrane is not injected. A middle ear effusion is present.   Left Ear: External ear normal. Tympanic membrane is bulging. Tympanic membrane is not injected. A middle ear effusion is present.   Nose: Mucosal edema and rhinorrhea present.       Mouth/Throat: Uvula is midline, oropharynx is clear and moist and mucous membranes are normal.   Clear fluid    Eyes: No scleral icterus.   Pulmonary/Chest: Effort normal.   Lymphadenopathy:        Head (right side): No submandibular, no tonsillar and no preauricular adenopathy present.        Head (left side): No submandibular, no tonsillar and no preauricular adenopathy present.     She has  no cervical adenopathy.        Right: No supraclavicular adenopathy present.        Left: No supraclavicular adenopathy present.   Neurological: She is alert.   Psychiatric: She has a normal mood and affect. Her behavior is normal.   Nursing note and vitals reviewed.        Ortho Exam    Assessment & Plan:       ICD-10-CM    1. Ear fullness, left H93.8X2    2. Nasal congestion R09.81 fluticasone (FLONASE) 50 mcg/actuation Nasal Spray, Suspension   3. BMI 27.0-27.9,adult Z68.27 Healthy diet     Drink plenty of water,take Tylenol and /or Ibuprofen as needed for pain and fever, nose irrigation with saline (mild salt water), gargle with warm salt water 2-3 times a day.

## 2015-10-29 ENCOUNTER — Other Ambulatory Visit (INDEPENDENT_AMBULATORY_CARE_PROVIDER_SITE_OTHER): Payer: Self-pay | Admitting: Geriatric Medicine

## 2015-11-20 ENCOUNTER — Other Ambulatory Visit: Payer: Self-pay | Admitting: Geriatric Medicine

## 2015-12-04 ENCOUNTER — Encounter (INDEPENDENT_AMBULATORY_CARE_PROVIDER_SITE_OTHER): Payer: Self-pay | Admitting: Geriatric Medicine

## 2015-12-04 ENCOUNTER — Ambulatory Visit (INDEPENDENT_AMBULATORY_CARE_PROVIDER_SITE_OTHER): Payer: Medicare PPO | Admitting: Geriatric Medicine

## 2015-12-04 VITALS — BP 158/84 | HR 67 | Temp 97.8°F | Resp 12 | Ht 65.0 in | Wt 170.2 lb

## 2015-12-04 DIAGNOSIS — M79641 Pain in right hand: Secondary | ICD-10-CM

## 2015-12-04 DIAGNOSIS — Z6828 Body mass index (BMI) 28.0-28.9, adult: Secondary | ICD-10-CM

## 2015-12-04 DIAGNOSIS — M65311 Trigger thumb, right thumb: Secondary | ICD-10-CM

## 2015-12-04 NOTE — Progress Notes (Signed)
Subjective:     Patient ID:  Carrie Escobar is an 70 y.o. female     Chief Complaint:    Chief Complaint   Patient presents with    Thumb Pain    Carpal Tunnel       HPI  Pt presents today with right thumb pain x1 month. Pt describes her pain as "a constant ache" and states with movement she feels a "clicking". Pt rates her pain a 3/10 currently but states with movement it is worse. Pt denies any swelling or redness. Pt reports "stiffness". Pt states she was dx with carpal tunnel 2011; and has had surgery on her left hand. Pt has taken OTC Advil and wearing a carpal glove; which has provided some relief.   Past Medical History  Current Outpatient Prescriptions   Medication Sig    amLODIPine (NORVASC) 5 mg Oral Tablet TAKE 1 TABLET DAILY    bisoprolol-hydroCHLOROthiazide (ZIAC) 10-6.25 mg Oral Tablet TAKE 1 TABLET DAILY    calcium citrate-vitamin D3 (CITRACAL) 200 mg calcium -250 unit Oral Tablet Take 1 Tab by mouth Once a day    Flaxseed Oil 1,000 mg Oral Capsule Take by mouth    fluticasone (FLONASE) 50 mcg/actuation Nasal Spray, Suspension 2 Sprays by Each Nostril route Once a day    multivitamin Oral Tablet Take 1 Tab by mouth Once a day    timolol maleate (TIMOPTIC) 0.25 % Ophthalmic Drops Instill 1 Drop into right eye Twice daily     Allergies   Allergen Reactions    Amoxicillin Rash     Past Medical History:   Diagnosis Date    Breast mass, right     benign    Cataract     bilat    Family history of colon cancer     Heartburn     HTN (hypertension)     Nausea with vomiting     sister with PONV    Obesity     Pulmonary emboli     only once in 1999 after her hysterectomy    Wears glasses     reading         Past Surgical History:   Procedure Laterality Date    HX BREAST LUMPECTOMY Right     benign    HX CATARACT REMOVAL  10/2013    left    HX HYSTERECTOMY  1999    fibroid    HX ROTATOR CUFF REPAIR  11/04/2013    right shoulder         Family History   Problem Relation Age of Onset      Stroke Mother     Hypertension Mother     Cancer Mother      colon    Stroke Father     Cancer Sister      ovaian    No Known Problems Brother     Breast Cancer Maternal Grandmother     No Known Problems Maternal Grandfather     No Known Problems Paternal Grandmother     No Known Problems Paternal Grandfather     No Known Problems Daughter     No Known Problems Son     No Known Problems Maternal Aunt     No Known Problems Maternal Uncle     No Known Problems Paternal Aunt     No Known Problems Paternal Uncle     No Known Problems Other     Cervical Cancer Neg Hx  Colon Cancer Neg Hx     Liver Cancer Neg Hx     Lung Cancer Neg Hx     Lymphoma Neg Hx     Melanoma Neg Hx     Ovarian Cancer Neg Hx     Pancreatic Cancer Neg Hx     Thyroid Cancer Neg Hx     Uterine Cancer Neg Hx     Prostate Cancer Neg Hx     Leukemia Neg Hx          Social History     Social History    Marital status: Divorced     Spouse name: N/A    Number of children: 0    Years of education: N/A     Occupational History     [employer]     Social History Main Topics    Smoking status: Never Smoker    Smokeless tobacco: Never Used    Alcohol use No    Drug use: No    Sexual activity: Not on file     Other Topics Concern    Abuse/Domestic Violence No    Drives Yes    Special Diet No    Uses Cane No    Uses Walker No    Uses Wheelchair No    Right Hand Dominant Yes    Left Hand Dominant No    Ability To Walk 1 Flight Of Steps Without Sob/Cp Yes    Routine Exercise Yes     Zumba class    Ability To Walk 2 Flight Of Steps Without Sob/Cp Yes    Ability To Do Own Adl's Yes     Social History Narrative         Review of Systems   Constitutional: Negative.    HENT: Negative.    Musculoskeletal: Positive for arthralgias.   Skin: Negative.        Objective:     Visit Vitals    BP (!) 158/84    Pulse 67    Temp 36.6 C (97.8 F) (Tympanic)    Resp 12    Ht 1.651 m (5\' 5" )    Wt 77.2 kg (170 lb 3.2 oz)     SpO2 99%    Breastfeeding No    BMI 28.32 kg/m2        Physical Exam   Constitutional: She is oriented to person, place, and time. She appears well-developed and well-nourished.   HENT:   Head: Normocephalic and atraumatic.   Right Ear: External ear normal.   Left Ear: External ear normal.   Pulmonary/Chest: Effort normal.   Musculoskeletal:        Hands:  Neurological: She is alert and oriented to person, place, and time.   Skin: No rash noted. No cyanosis. Nails show no clubbing.   Nursing note and vitals reviewed.          Ortho Exam    Assessment & Plan:       ICD-10-CM    1. Trigger finger of right thumb M65.311 Hand out was given  Warm compresses  NSAIDS  Thumb brace  RTC for steroid injection if not better   2. Pain of right hand M79.641    3. BMI 28.0-28.9,adult Z68.28 Low fat/ low carb diet, weight loss, and exercise to reduce the above normal BMI.

## 2015-12-04 NOTE — Patient Instructions (Signed)
Trigger Finger °Trigger finger (digital tendinitis and stenosing tenosynovitis) is a common disorder that causes an often painful catching of the fingers or thumb. It occurs as a clicking, snapping, or locking of a finger in the palm of the hand. This is caused by a problem with the tendons that flex or bend the fingers sliding smoothly through their sheaths. The condition may occur in any finger or a couple fingers at the same time.  °The finger may lock with the finger curled or suddenly straighten out with a snap. This is more common in patients with rheumatoid arthritis and diabetes. Left untreated, the condition may get worse to the point where the finger becomes locked in flexion, like making a fist, or less commonly locked with the finger straightened out. °CAUSES  °· Inflammation and scarring that lead to swelling around the tendon sheath. °· Repeated or forceful movements. °· Rheumatoid arthritis, an autoimmune disease that affects joints. °· Gout. °· Diabetes mellitus. °SIGNS AND SYMPTOMS °· Soreness and swelling of your finger. °· A painful clicking or snapping as you bend and straighten your finger. °DIAGNOSIS  °Your health care provider will do a physical exam of your finger to diagnose trigger finger. °TREATMENT  °· Splinting for 6-8 weeks may be helpful. °· Nonsteroidal anti-inflammatory medicines (NSAIDs) can help to relieve the pain and inflammation. °· Cortisone injections, along with splinting, may speed up recovery. Several injections may be required. Cortisone may give relief after one injection. °· Surgery is another treatment that may be used if conservative treatments do not work. Surgery can be minor, without incisions (a cut does not have to be made), and can be done with a needle through the skin. °· Other surgical choices involve an open procedure in which the surgeon opens the hand through a small incision and cuts the pulley so the tendon can again slide smoothly. Your hand will still  work fine. °HOME CARE INSTRUCTIONS °· Apply ice to the injured area, twice per day: °¨ Put ice in a plastic bag. °¨ Place a towel between your skin and the bag. °¨ Leave the ice on for 20 minutes, 3-4 times a day. °· Rest your hand often. °MAKE SURE YOU:  °· Understand these instructions. °· Will watch your condition. °· Will get help right away if you are not doing well or get worse. °  °This information is not intended to replace advice given to you by your health care provider. Make sure you discuss any questions you have with your health care provider. °  °Document Released: 07/16/2004 Document Revised: 05/29/2013 Document Reviewed: 02/26/2013 °Elsevier Interactive Patient Education ©2016 [employer] ° °

## 2015-12-04 NOTE — Progress Notes (Signed)
Pt presents today with right thumb pain x1 month.  Pt describes her pain as "a constant ache" and states with movement she feels a "clicking".  Pt rates her pain a 3/10 currently but states with movement it is worse.  Pt denies any swelling or redness.  Pt reports "stiffness".  Pt states she was dx with carpal tunnel 2011; and has had surgery on her left hand.  Pt has taken OTC Advil and wearing a carpal glove; which has provided some relief.  Pt has no further concerns today.  Mariel Aloe, MA

## 2015-12-30 ENCOUNTER — Other Ambulatory Visit: Payer: Self-pay | Admitting: Geriatric Medicine

## 2015-12-30 MED ORDER — AMLODIPINE 5 MG TABLET
ORAL_TABLET | ORAL | 1 refills | Status: DC
Start: 2015-12-30 — End: 2015-12-31

## 2015-12-30 NOTE — Telephone Encounter (Signed)
Please review and sign. Thank you. Doylene Splinter, MA

## 2015-12-31 ENCOUNTER — Other Ambulatory Visit: Payer: Self-pay | Admitting: Geriatric Medicine

## 2015-12-31 MED ORDER — AMLODIPINE 10 MG TABLET
ORAL_TABLET | ORAL | 1 refills | Status: DC
Start: 2015-12-31 — End: 2016-05-25

## 2015-12-31 NOTE — Telephone Encounter (Signed)
Please review and sign.  Pt called and stated RX was called into [facility] but she uses [facility].  Pt also states she was advised by Dr. Tempie Donning to increase dose of 5mg  to 10mg .  I have called [facility] to cancel.  Thank you.  Mariel Aloe, MA

## 2016-01-07 ENCOUNTER — Ambulatory Visit (INDEPENDENT_AMBULATORY_CARE_PROVIDER_SITE_OTHER): Payer: Medicare PPO | Admitting: Geriatric Medicine

## 2016-01-07 ENCOUNTER — Encounter (INDEPENDENT_AMBULATORY_CARE_PROVIDER_SITE_OTHER): Payer: Self-pay | Admitting: Geriatric Medicine

## 2016-01-07 VITALS — BP 132/90 | HR 62 | Temp 97.4°F | Resp 12 | Ht 65.0 in | Wt 171.6 lb

## 2016-01-07 DIAGNOSIS — M65311 Trigger thumb, right thumb: Secondary | ICD-10-CM

## 2016-01-07 DIAGNOSIS — M79644 Pain in right finger(s): Secondary | ICD-10-CM

## 2016-01-07 DIAGNOSIS — Z6828 Body mass index (BMI) 28.0-28.9, adult: Secondary | ICD-10-CM

## 2016-01-07 NOTE — Progress Notes (Signed)
Subjective:     Patient ID:  Carrie Escobar is an 70 y.o. female     Chief Complaint:    Chief Complaint   Patient presents with    Trigger Finger       HPI  Pt presents today for a right thumb tigger finger. Pt states she don't remember injuring herself. Pt states it has been a "tightness" feeling for two months. Pt states she soaks her thumb in warm water with epsom salt; temporaily relief. Upon observation pt can bend her thumb but is "sore". Pt states when she put any pressure on her thumb its "sore".  Past Medical History  Current Outpatient Prescriptions   Medication Sig    amLODIPine (NORVASC) 10 mg Oral Tablet TAKE 1 TABLET DAILY    bisoprolol-hydroCHLOROthiazide (ZIAC) 10-6.25 mg Oral Tablet TAKE 1 TABLET DAILY    calcium citrate-vitamin D3 (CITRACAL) 200 mg calcium -250 unit Oral Tablet Take 1 Tab by mouth Once a day    Flaxseed Oil 1,000 mg Oral Capsule Take by mouth    fluticasone (FLONASE) 50 mcg/actuation Nasal Spray, Suspension 2 Sprays by Each Nostril route Once a day    multivitamin Oral Tablet Take 1 Tab by mouth Once a day    timolol maleate (TIMOPTIC) 0.25 % Ophthalmic Drops Instill 1 Drop into right eye Twice daily     Allergies   Allergen Reactions    Amoxicillin Rash     Past Medical History:   Diagnosis Date    Breast mass, right     benign    Cataract     bilat    Family history of colon cancer     Heartburn     HTN (hypertension)     Nausea with vomiting     sister with PONV    Obesity     Pulmonary emboli     only once in 1999 after her hysterectomy    Wears glasses     reading         Past Surgical History:   Procedure Laterality Date    HX BREAST LUMPECTOMY Right     benign    HX CATARACT REMOVAL  10/2013    left    HX HYSTERECTOMY  1999    fibroid    HX ROTATOR CUFF REPAIR  11/04/2013    right shoulder         Family History   Problem Relation Age of Onset    Stroke Mother     Hypertension Mother     Cancer Mother      colon    Stroke Father     Cancer  Sister      ovaian    No Known Problems Brother     Breast Cancer Maternal Grandmother     No Known Problems Maternal Grandfather     No Known Problems Paternal Grandmother     No Known Problems Paternal Grandfather     No Known Problems Daughter     No Known Problems Son     No Known Problems Maternal Aunt     No Known Problems Maternal Uncle     No Known Problems Paternal Aunt     No Known Problems Paternal Uncle     No Known Problems Other     Cervical Cancer Neg Hx     Colon Cancer Neg Hx     Liver Cancer Neg Hx     Lung Cancer Neg Hx  Lymphoma Neg Hx     Melanoma Neg Hx     Ovarian Cancer Neg Hx     Pancreatic Cancer Neg Hx     Thyroid Cancer Neg Hx     Uterine Cancer Neg Hx     Prostate Cancer Neg Hx     Leukemia Neg Hx          Social History     Social History    Marital status: Divorced     Spouse name: N/A    Number of children: 0    Years of education: N/A     Occupational History     [employer]     Social History Main Topics    Smoking status: Never Smoker    Smokeless tobacco: Never Used    Alcohol use No    Drug use: No    Sexual activity: Not on file     Other Topics Concern    Abuse/Domestic Violence No    Drives Yes    Special Diet No    Uses Cane No    Uses Walker No    Uses Wheelchair No    Right Hand Dominant Yes    Left Hand Dominant No    Ability To Walk 1 Flight Of Steps Without Sob/Cp Yes    Routine Exercise Yes     Zumba class    Ability To Walk 2 Flight Of Steps Without Sob/Cp Yes    Ability To Do Own Adl's Yes     Social History Narrative         Review of Systems   Constitutional: Negative.    HENT: Negative.    Musculoskeletal: Positive for arthralgias.   Skin: Negative.        Objective:     BP 132/90   Pulse 62   Temp 36.3 C (97.4 F) (Tympanic)    Resp 12   Ht 1.651 m (5\' 5" )   Wt [id number] kg (171 lb 9.6 oz)   SpO2 99%   Breastfeeding? No   BMI 28.56 kg/m2     Physical Exam   Constitutional: She is oriented to person, place, and time. She  appears well-developed and well-nourished.   HENT:   Head: Normocephalic and atraumatic.   Right Ear: External ear normal.   Left Ear: External ear normal.   Pulmonary/Chest: Effort normal.   Musculoskeletal:        Right hand: She exhibits tenderness and swelling.        Hands:  Neurological: She is alert and oriented to person, place, and time.   Nursing note and vitals reviewed.        Ortho Exam    Assessment & Plan:       ICD-10-CM    1. Trigger finger of right thumb M65.311    2. Thumb pain, right M79.644      Thumb brace, NSIADS, warm compresses.  Cortisone injection was recommended but she refused.

## 2016-01-07 NOTE — Progress Notes (Signed)
Pt presents today for a right thumb tigger finger. Pt states she don't remember injuring herself. Pt states it has been a "tightness" feeling for two months. Pt states she soaks her thumb in warm water with epsom salt; temporaily relief. Upon observation pt can bend her thumb but is "sore". Pt states when she put any pressure on her thumb its "sore". Pt has no other concerns today. Lucille Passy, [place]

## 2016-01-14 ENCOUNTER — Emergency Department (HOSPITAL_BASED_OUTPATIENT_CLINIC_OR_DEPARTMENT_OTHER)
Admission: EM | Admit: 2016-01-14 | Discharge: 2016-01-14 | Disposition: A | Discharge status: Home / Self Care | Payer: Medicare PPO | Attending: Emergency Medicine | Admitting: Emergency Medicine

## 2016-01-14 ENCOUNTER — Emergency Department (HOSPITAL_BASED_OUTPATIENT_CLINIC_OR_DEPARTMENT_OTHER): Payer: Medicare PPO

## 2016-01-14 ENCOUNTER — Encounter (HOSPITAL_BASED_OUTPATIENT_CLINIC_OR_DEPARTMENT_OTHER): Payer: Self-pay

## 2016-01-14 DIAGNOSIS — Z79899 Other long term (current) drug therapy: Secondary | ICD-10-CM | POA: Insufficient documentation

## 2016-01-14 DIAGNOSIS — I10 Essential (primary) hypertension: Secondary | ICD-10-CM | POA: Insufficient documentation

## 2016-01-14 DIAGNOSIS — Z86711 Personal history of pulmonary embolism: Secondary | ICD-10-CM | POA: Insufficient documentation

## 2016-01-14 DIAGNOSIS — R079 Chest pain, unspecified: Secondary | ICD-10-CM | POA: Insufficient documentation

## 2016-01-14 DIAGNOSIS — S46811A Strain of other muscles, fascia and tendons at shoulder and upper arm level, right arm, initial encounter: Secondary | ICD-10-CM | POA: Insufficient documentation

## 2016-01-14 LAB — COMPREHENSIVE METABOLIC PROFILE - BMC/JMC ONLY
ALBUMIN/GLOBULIN RATIO: 1.7 (ref 0.8–2.0)
ALT (SGPT): 17 U/L (ref 14–54)
ANION GAP: 7 mmol/L (ref 3–11)
BILIRUBIN TOTAL: 0.6 mg/dL (ref 0.3–1.2)
CO2 TOTAL: 26 mmol/L (ref 22–32)
CREATININE: 1.02 mg/dL — ABNORMAL HIGH (ref 0.44–1.00)
POTASSIUM: 4.1 mmol/L (ref 3.4–5.1)
PROTEIN TOTAL: 7 g/dL (ref 6.4–8.3)

## 2016-01-14 LAB — CBC WITH DIFF
BASOPHIL #: 0 x10ˆ3/uL (ref 0.00–0.10)
BASOPHIL %: 0 % (ref 0–3)
EOSINOPHIL %: 2 % (ref 0–5)
HCT: 40.2 % (ref 36.0–45.0)
HGB: 13.5 g/dL (ref 12.0–15.5)
LYMPHOCYTE #: 1.5 x10ˆ3/uL (ref 1.00–4.80)
MCH: 28.2 pg (ref 27.5–33.2)
MCHC: 33.5 g/dL (ref 32.0–36.0)
MCV: 84.2 fL (ref 82.0–97.0)
MONOCYTE #: 0.5 x10ˆ3/uL (ref 0.20–0.90)
MONOCYTE %: 7 % (ref 5–12)
NEUTROPHIL %: 73 % (ref 43–76)
RDW: 14.3 % (ref 11.0–16.0)

## 2016-01-14 LAB — TROPONIN-I: TROPONIN I: <0.03 ng/mL (ref <=0.06)

## 2016-01-14 LAB — B-TYPE NATRIURETIC PEPTIDE (BNP),PLASMA: BNP: 24 pg/mL (ref 0–100)

## 2016-01-14 MED ORDER — HYDROCODONE 5 MG-ACETAMINOPHEN 325 MG TABLET
1.00 | ORAL_TABLET | Freq: Every evening | ORAL | 0 refills | Status: DC | PRN
Start: 2016-01-14 — End: 2016-03-25

## 2016-01-14 MED ORDER — HYDROMORPHONE 1 MG/ML INJECTION WRAPPER
1.0000 mg | INJECTION | INTRAMUSCULAR | Status: AC
Start: 2016-01-14 — End: 2016-01-14
  Administered 2016-01-14: 1 mg via INTRAVENOUS
  Filled 2016-01-14: qty 1

## 2016-01-14 MED ORDER — IOPAMIDOL 370 MG IODINE/ML (76 %) INTRAVENOUS SOLUTION
100.00 mL | INTRAVENOUS | Status: AC
Start: 2016-01-14 — End: 2016-01-14
  Administered 2016-01-14: 80 mL via INTRAVENOUS
  Filled 2016-01-14: qty 100

## 2016-01-14 MED ORDER — ASPIRIN 81 MG CHEWABLE TABLET
324.00 mg | CHEWABLE_TABLET | ORAL | Status: AC
Start: 2016-01-14 — End: 2016-01-14
  Administered 2016-01-14: 243 mg via ORAL
  Filled 2016-01-14: qty 4

## 2016-01-14 MED ORDER — PROMETHAZINE 12.5MG IN NS 50ML PREMIX
12.50 mg | INJECTION | INTRAVENOUS | Status: AC
Start: 2016-01-14 — End: 2016-01-14
  Administered 2016-01-14: 12.5 mg via INTRAVENOUS
  Filled 2016-01-14: qty 50

## 2016-01-14 NOTE — ED Provider Notes (Signed)
Carrie Escobar, P.A.  [facility]  Emergency Department Visit Note    Date: 01/14/2016  Primary care provider: Kayleen Memos, MD  Means of arrival: private car  History obtained by: patient  History limited by: none  ED Attending: Dr Maricela Bo      Chief Complaint: chest pain    History of Present Illness     Carrie Escobar, date of birth November 26, 1945, is a 70 y.o. female who presents to the Emergency Department complaining of chest pain & neck pain.  This elderly woman presents here today with right-sided upper shoulder, neck, and chest pain.  The patient states that she was fine when she went to bed last evening.  She states that this morning, when she woke up at 5:00 to use the restroom, she had no pain.  She states that 1 hour later, she was experiencing right-sided neck pain.  It also involves the right chest.  This pain was aggravated by movement and it caused her to experience a sharp stabbing pain.  The patient also states that the neck pain is aggravated by turning her head to the right.  The patient did go to the gym on the previous day's date where she used two pieces of equipment that she does not normally use.  She does add that she is very active and she engages in exercise at least 5 times a week.  The patient has tried no prehospital treatment.  The patient did drive herself here.  The patient denies any known coronary artery disease.  The patient denies that she has any siblings or that her parents had any coronary artery disease.  She is treated for high blood pressure.  The patient is not reporting any additional symptoms.  There are no reports of any fever, headache, dizziness, ear drainage, sore throat, any palpitations, any wheezing or shortness of breath, no orthopnea, no abdominal pain, no nausea, no vomiting, no change in bowel or bladder pattern, no extremity paresthesias or focal limb weakness, no rashes.  All other remaining review of systems have been discussed and found to  be negative.    PE 1991 after Hysterectomy  Context:  As Per HPI  Pertinent Past Medical History:  Reviewed  Onset:  As per HPI  Timing:  As per HPI  Location/Radiation:  Chest pain,right side of neck & shoulder  Quality:  As per HPI  Severity: 4   Modifying Factors:  As per HPI  Associated Symptoms:   Positive:  As per HPI  Negative:  As per HPI    Review of Systems     The pertinent positive and negative symptoms are as per HPI. All other systems reviewed and are negative.    Patient History      Past Medical History:  Past Medical History:   Diagnosis Date   . Breast mass, right     benign   . Cataract     bilat   . Family history of colon cancer    . Heartburn    . HTN (hypertension)    . Nausea with vomiting     sister with PONV   . Obesity    . Pulmonary emboli     only once in 1999 after her hysterectomy   . Wears glasses     reading           Past Surgical History:  Past Surgical History:   Procedure Laterality Date   . HX BREAST LUMPECTOMY Right  benign   . HX CATARACT REMOVAL  10/2013    left   . HX HYSTERECTOMY  1999    fibroid   . HX ROTATOR CUFF REPAIR  11/04/2013    right shoulder           Family History:  Family History   Problem Relation Age of Onset   . Stroke Mother    . Hypertension Mother    . Cancer Mother      colon   . Stroke Father    . Cancer Sister      ovaian   . No Known Problems Brother    . Breast Cancer Maternal Grandmother    . No Known Problems Maternal Grandfather    . No Known Problems Paternal Grandmother    . No Known Problems Paternal Grandfather    . No Known Problems Daughter    . No Known Problems Son    . No Known Problems Maternal Aunt    . No Known Problems Maternal Uncle    . No Known Problems Paternal Aunt    . No Known Problems Paternal Uncle    . No Known Problems Other    . Cervical Cancer Neg Hx    . Colon Cancer Neg Hx    . Liver Cancer Neg Hx    . Lung Cancer Neg Hx    . Lymphoma Neg Hx    . Melanoma Neg Hx    . Ovarian Cancer Neg Hx    . Pancreatic Cancer Neg  Hx    . Thyroid Cancer Neg Hx    . Uterine Cancer Neg Hx    . Prostate Cancer Neg Hx    . Leukemia Neg Hx            Social History:  Social History   Substance Use Topics   . Smoking status: Never Smoker   . Smokeless tobacco: Never Used   . Alcohol use No     History   Drug Use No       Medications:  Previous Medications    AMLODIPINE (NORVASC) 10 MG ORAL TABLET    TAKE 1 TABLET DAILY    BISOPROLOL-HYDROCHLOROTHIAZIDE (ZIAC) 10-6.25 MG ORAL TABLET    TAKE 1 TABLET DAILY    CALCIUM CITRATE-VITAMIN D3 (CITRACAL) 200 MG CALCIUM -250 UNIT ORAL TABLET    Take 1 Tab by mouth Once a day    FLAXSEED OIL 1,000 MG ORAL CAPSULE    Take by mouth    FLUTICASONE (FLONASE) 50 MCG/ACTUATION NASAL SPRAY, SUSPENSION    2 Sprays by Each Nostril route Once a day    MULTIVITAMIN ORAL TABLET    Take 1 Tab by mouth Once a day    TIMOLOL MALEATE (TIMOPTIC) 0.25 % OPHTHALMIC DROPS    Instill 1 Drop into right eye Twice daily       Allergies:   Allergies   Allergen Reactions   . Amoxicillin Rash       Physical Exam   PHYSICAL EXAMINATION:  General:  A well-nourished, well-developed elderly female patient, alert, pleasant.  The patient does experience increased discomfort with position changes, such as sitting forward or turning her head to the right.    NEUROLOGIC:  Speech is clear and coherent.  Cranial nerves II-VII are grossly intact.  Grips are symmetrical.  She can move all extremities independently with no focal weakness.  The patient's proprioception of the toes is intact and symmetrical.    HEENT:  Head is normocephalic and atraumatic.  Eyes -- conjunctivae are pink, sclerae are anicteric.  Pupils are round, equal, react to light.  Ears -- tympanic membranes without hyperemia, no bulging or retraction.  Oral cavity -- mucous membranes are moist.  Tongue is on midline.  Posterior pharynx is patent without asymmetry of the soft palate.    NECK:  Supple, no adenopathy, no jugular venous distention or bruits.  No nuchal rigidity.   Positive reproducible tenderness along the trapezius muscle on the right side of the neck.    HEART:  Regular rate and rhythm without murmur, rub, or gallop.  Peripheral pulses are symmetrical and +2 throughout.    LUNGS:  Clear to auscultation.    ABDOMEN:  Soft, positive bowel sounds, no voluntary guarding or rebound.    EXTREMITIES:  No dependent edema.    INTEGUMENTARY SYSTEM:  No rashes or petechiae.    The patient was treated with 324 milligrams of baby aspirin.  The patient has been ordered a CT angiogram of the chest because of previous pulmonary embolus.  The patient has been ordered analgesic pain medication and an antiemetic.        Vital Signs:  Filed Vitals:    01/14/16 0756 01/14/16 0939 01/14/16 1134   BP: (!) 157/102 (!) 154/68 (!) 149/77   Pulse: 68 64 60   Resp: 16 14 17    Temp: 36.3 C (97.4 F)     SpO2: 100%  100%              Diagnostics     Labs:    Results for orders placed or performed during the hospital encounter of 01/14/16 (from the past 12 hour(s))   B-TYPE NATRIURETIC PEPTIDE   Result Value Ref Range    BNP 24 0 - 100 pg/mL   COMPREHENSIVE METABOLIC PROFILE - [facility] ONLY   Result Value Ref Range    SODIUM 139 136 - 145 mmol/L    POTASSIUM 4.1 3.4 - 5.1 mmol/L    CHLORIDE 106 101 - 111 mmol/L    CO2 TOTAL 26 22 - 32 mmol/L    ANION GAP 7 3 - 11 mmol/L    BUN 10 6 - 20 mg/dL    CREATININE 1.02 (H) 0.44 - 1.00 mg/dL    BUN/CREA RATIO 10 6 - 22    ESTIMATED GFR >60 >60 mL/min/1.75m^2    ALBUMIN 4.4 3.5 - 5.0 g/dL    CALCIUM 9.9 8.8 - 10.2 mg/dL    GLUCOSE 141 (H) 70 - 110 mg/dL    ALKALINE PHOSPHATASE 66 38 - 126 U/L    ALT (SGPT) 17 14 - 54 U/L    AST (SGOT) 22 15 - 41 U/L    BILIRUBIN TOTAL 0.6 0.3 - 1.2 mg/dL    PROTEIN TOTAL 7.0 6.4 - 8.3 g/dL    ALBUMIN/GLOBULIN RATIO 1.7 0.8 - 2.0   C-REACTIVE PROTEIN(CRP),INFLAMMATION   Result Value Ref Range    CRP INFLAMMATION 2.9 <=10.0 mg/L   TROPONIN-I   Result Value Ref Range    TROPONIN I <0.03 <=0.06 ng/mL   CBC WITH DIFF   Result Value Ref  Range    WBC 8.1 4.0 - 11.0 x10^3/uL    RBC 4.77 4.00 - 5.10 x10^6/uL    HGB 13.5 12.0 - 15.5 g/dL    HCT 40.2 36.0 - 45.0 %    MCV 84.2 82.0 - 97.0 fL    MCH 28.2 27.5 - 33.2 pg    MCHC  33.5 32.0 - 36.0 g/dL    RDW 14.3 11.0 - 16.0 %    PLATELETS 201 150 - 450 x10^3/uL    MPV 9.1 7.4 - 10.5 fL    NEUTROPHIL % 73 43 - 76 %    LYMPHOCYTE % 19 15 - 43 %    MONOCYTE % 7 5 - 12 %    EOSINOPHIL % 2 0 - 5 %    BASOPHIL % 0 0 - 3 %    NEUTROPHIL # 5.90 1.50 - 6.50 x10^3/uL    LYMPHOCYTE # 1.50 1.00 - 4.80 x10^3/uL    MONOCYTE # 0.50 0.20 - 0.90 x10^3/uL    EOSINOPHIL # 0.20 0.00 - 0.50 x10^3/uL    BASOPHIL # 0.00 0.00 - 0.10 x10^3/uL   TROPONIN-I   Result Value Ref Range    TROPONIN I <0.03 <=0.06 ng/mL     Labs reviewed and interpreted by me.    Radiology:   CT ANGIO CHEST FOR PULMONARY EMBOLUS  Interpreted by radiologist and independently reviewed by me.  Results for orders placed or performed during the hospital encounter of 01/14/16 (from the past 72 hour(s))   CT ANGIO CHEST FOR PULMONARY EMBOLUS     Status: None (In process)    Narrative    RADIOLOGIST: Dimitri Misailidis, MD/pb     CT PULMONARY ANGIOGRAM     (DLP 1329 mGy-cm)     HISTORY:  The patient complains of right upper chest pain, with history of blood clot.     90 mL Isovue 370 was given intravenously.       3D MIP coronal images of the pulmonary arteries were done.     FINDINGS:  Thoracic aorta is normal in caliber.  No evidence for pulmonary embolus.  There are patchy atelectatic changes in the dependent portion of the lung bases.  Upper abdominal images show normal sized adrenal glands bilaterally.  Mild changes of spondylosis involving the mid and lower thoracic spine.       Impression       1.  No evidence for pulmonary embolus.  2.  Patchy atelectatic changes in the dependent portion at the lung bases.             Delories Heinz on Accession: [id number] MRN: [id number] OrderingMD: Solei Wubben J      >>>>>> Prelim: No evidence of a pulmonary  embolus.  Patchy atelectatic changes in the dependent portion of the lung bases.      See_Notes     >>>>>> States:  By Administrator @4 /03/2016 10:58:22 AM: Needs Prelim   By [email or web address] @4 /03/2016 11:02:58 AM: Prelim from Rad - Positive   By dwert @4 /03/2016 11:03:47 AM: Acknowledged by ED     >>>>>> Notes:  By System @4 /03/2016 10:58:22 AM: This Prelim Auto-Initiated by the Primordial System     >>>>>> Final Communication:     EKG NSR WITH 1 ST DEGREE AV BLOCK;REVIEWED BY DR Maricela Bo     The patient has been reassessed in the department.  She states that her symptoms of neck pain and chest pain have improved after receiving Dilaudid.  The patient has been told her CT scan is negative for PE or pneumonia.  The patient has been told that she needs a repeat troponin level.  In addition, the patient has been told that her presentation warrants that she stay in the hospital for at least 24 hours for reevaluation.  The patient is stating that she does not feel that she would  stay in the hospital, but she does want to follow through with the repeat troponin level, which has been ordered.  A request has also been made to feed the patient.        Old records reviewed by me:      Orders Placed This Encounter   . CT ANGIO CHEST FOR PULMONARY EMBOLUS   . B-TYPE NATRIURETIC PEPTIDE   . CBC/DIFF   . COMPREHENSIVE METABOLIC PROFILE - [facility] ONLY   . C-REACTIVE PROTEIN(CRP),INFLAMMATION   . TROPONIN-I   . CBC WITH DIFF   . TROPONIN-I   . ECG 12-Lead   . ECG 12-LEAD   . INSERT & MAINTAIN PERIPHERAL IV ACCESS   . aspirin chewable tablet 324 mg   . HYDROmorphone (DILAUDID) 1 mg/mL injection   . promethazine (PHENERGAN) 12.5mg  in NS 72mL premix IVPB   . iopamidol (ISOVUE-370) 76% infusion   . HYDROcodone-acetaminophen (NORCO) 5-325 mg Oral Tablet     This lady has been reassessed several times during her stay here in the Emergency Department.  She reports significant improvement in her pain symptoms after receiving pain medication.   The patient's pain is definitely reproduced by abducting or flexing the right forearm and shoulder.  The patient has not experienced any palpitations or increased chest discomfort.  She does not feel short of breath.  The patient had a second troponin, which is negative.  The patient was discussed with Dr. Maricela Bo and the patient is going to be discharged to outpatient followup.  The patient was given a hand-out on nonspecific chest pain, as well as on muscle strains.  The patient can return here at any time between now and the first of next week, which would be January 18, 2016, for any additional concerns.  Otherwise, follow up with her regular provider the week of January 18, 2016.  The patient has been cautioned about taking pain medications with potential side effects of dizziness,CNS depression & constipation.  The patient verbalizes understanding and agreement with outpatient management.    CONDITION ON DISCHARGE:  Considered mildly improved and stable.         Pre-hypertension/ Hypertension: The patient has been informed that they may have pre-hypertension or Hypertension based on a blood pressure reading in the emergency department.  I recommend that the patient call the primary care provider listed on their discharge instructions or a physician of their choice this week to arrange follow up for further evaluation of possible pre-hypertension or Hypertension.    Pre-Disposition Vitals:  Filed Vitals:    01/14/16 0756 01/14/16 0939 01/14/16 1134   BP: (!) 157/102 (!) 154/68 (!) 149/77   Pulse: 68 64 60   Resp: 16 14 17    Temp: 36.3 C (97.4 F)     SpO2: 100%  100%       Clinical Impression    1.Chest Pain;non specific  2.Acute Trapezius strain;Right  3.Acute Right Shoulder Strain      Plan/Disposition     Discharged    Prescriptions:  New Prescriptions    HYDROCODONE-ACETAMINOPHEN (NORCO) 5-325 MG ORAL TABLET    Take 1 Tab by mouth Every night as needed for Pain for up to 12 doses       Follow Up:  Kayleen Memos, MD  [address] [place] [address]  [phone]    Schedule an appointment as soon as possible for a visit today      [facility]  [place]  [place]  [phone]    As needed        Condition on Disposition: stable

## 2016-01-14 NOTE — ED Triage Notes (Addendum)
Pt reports right sided chest and neck pain upon awakening.  Reports being at the gym yesterday

## 2016-01-14 NOTE — ED Nurses Note (Signed)
Patient discharged home with family.  AVS reviewed with patient/care giver.  A written copy of the AVS and discharge instructions was given to the patient/care giver.  Questions sufficiently answered as needed.  Patient/care giver encouraged to follow up with PCP as indicated.  In the event of an emergency, patient/care giver instructed to call 911 or go to the nearest emergency room.        Current Discharge Medication List      START taking these medications.       Details    HYDROcodone-acetaminophen 5-325 mg Tablet   Commonly known as:  NORCO    1 Tab, Oral, HS PRN   Qty:  12 Tab   Refills:  0         CONTINUE these medications - NO CHANGES were made during your visit.       Details    amLODIPine 10 mg Tablet   Commonly known as:  NORVASC    TAKE 1 TABLET DAILY   Qty:  90 Tab   Refills:  1       bisoprolol-hydroCHLOROthiazide 10-6.25 mg Tablet   Commonly known as:  ZIAC    TAKE 1 TABLET DAILY   Qty:  90 Tab   Refills:  1       calcium citrate-vitamin D3 200 mg calcium -250 unit Tablet   Commonly known as:  CITRACAL    1 Tab, Daily   Refills:  0       Flaxseed Oil 1,000 mg Capsule    Oral   Refills:  0       fluticasone 50 mcg/actuation Spray, Suspension   Commonly known as:  FLONASE    2 Sprays, Each Nostril, Daily   Qty:  1 Inhaler   Refills:  1       multivitamin Tablet    1 Tab, Daily   Refills:  0       timolol maleate 0.25 % Drops   Commonly known as:  TIMOPTIC    1 Drop, Right Eye, 2x/day   Refills:  0

## 2016-01-14 NOTE — ED Nurses Note (Signed)
Patient took one 81mg  ASA this morning.  Administered 3 instead of 4.

## 2016-01-14 NOTE — ED Nurses Note (Signed)
Rounded on patient.  Reviewed vital signs and treatment plan.  Asked if patient had any needs, especially in the area of toileting, pain management and general comfort.  Addressed issues.  Asked the patient/family if they had any needs before I left the room.  Indicated that I would be back within the hour to evaluate them again and update them on throughput progress.  Call bell within reach. Pt. Given a box lunch & ginger ale, states she does not want to eat anything right now. Watching TV.

## 2016-01-14 NOTE — ED Nurses Note (Signed)
Ambulated to bathroom

## 2016-01-15 ENCOUNTER — Encounter (INDEPENDENT_AMBULATORY_CARE_PROVIDER_SITE_OTHER): Payer: Self-pay | Admitting: Geriatric Medicine

## 2016-01-15 ENCOUNTER — Ambulatory Visit (INDEPENDENT_AMBULATORY_CARE_PROVIDER_SITE_OTHER): Payer: Medicare PPO | Admitting: Geriatric Medicine

## 2016-01-15 VITALS — BP 126/74 | HR 55 | Temp 97.2°F | Resp 16 | Ht 65.0 in | Wt 168.8 lb

## 2016-01-15 DIAGNOSIS — M549 Dorsalgia, unspecified: Secondary | ICD-10-CM

## 2016-01-15 DIAGNOSIS — R0789 Other chest pain: Secondary | ICD-10-CM

## 2016-01-15 DIAGNOSIS — Z6828 Body mass index (BMI) 28.0-28.9, adult: Secondary | ICD-10-CM

## 2016-01-15 DIAGNOSIS — S29012D Strain of muscle and tendon of back wall of thorax, subsequent encounter: Secondary | ICD-10-CM

## 2016-01-15 LAB — ECG 12-LEAD
Atrial Rate: 66 {beats}/min
Calculated P Axis: 51 degrees
Calculated R Axis: -7 degrees
Calculated T Axis: 29 degrees
PR Interval: 212 ms
QRS Duration: 84 ms
QT Interval: 394 ms
QTC Calculation: 413 ms
Ventricular rate: 66 {beats}/min

## 2016-01-15 MED ORDER — CYCLOBENZAPRINE 10 MG TABLET
10.00 mg | ORAL_TABLET | Freq: Three times a day (TID) | ORAL | 0 refills | Status: DC | PRN
Start: 2016-01-15 — End: 2016-03-25

## 2016-01-15 MED ORDER — IBUPROFEN 800 MG-FAMOTIDINE 26.6 MG TABLET
1.00 | ORAL_TABLET | Freq: Three times a day (TID) | ORAL | Status: DC
Start: 2016-01-15 — End: 2016-03-25

## 2016-01-15 NOTE — Progress Notes (Signed)
Pt presents today for hospital follow up and chest pain.  Pt was seen in the emergency department 01/14/16 and dx with non specific chest pain.  Pt states today she is having intermittent chest pain.  She reports that pains onset is with movement.  She describes it as a "pulling feeling" that starts on the right side of her chest which radiates up onto her neck and down to her scapula.  She states it has happened 3-4 times today.  Pt rates this pain when occurring 4/10.  Pt denies SOB, nausea or vomiting.  Pt was given Norco 5 mg for her pain; which helps.  Pt has no further concerns today.  Mariel Aloe, MA

## 2016-01-15 NOTE — Progress Notes (Signed)
Subjective:     Patient ID:  Carrie Escobar is an 70 y.o. female     Chief Complaint:    Chief Complaint   Patient presents with    Hospital Follow Up     ed visit 01/14/16    Chest Pain        HPI  Pt presents today for hospital follow up and chest pain. It started suddenly, no URI symptoms, no rash, no fall, she woke up with pain,  Pt was seen in the emergency department 01/14/16 and dx with non specific chest pain. Labs and EKG and CT scan of chest were in acceptable range no acute finding.  Pt states today she is having constant right side chest pain with radiation to right upper back . She reports that pains onset is with movement/ palpation and cough. She describes it as a "pulling feeling" that starts on the right side of her chest which radiates up onto her neck and down to her scapula. . Pt rates this pain when occurring 4/10. Pt denies SOB, nausea or vomiting. Pt was given Norco 5 mg for her pain; which helps.     ER not and tests were reviewed.   Past Medical History  Current Outpatient Prescriptions   Medication Sig    amLODIPine (NORVASC) 10 mg Oral Tablet TAKE 1 TABLET DAILY    bisoprolol-hydroCHLOROthiazide (ZIAC) 10-6.25 mg Oral Tablet TAKE 1 TABLET DAILY    calcium citrate-vitamin D3 (CITRACAL) 200 mg calcium -250 unit Oral Tablet Take 1 Tab by mouth Once a day    Flaxseed Oil 1,000 mg Oral Capsule Take by mouth    fluticasone (FLONASE) 50 mcg/actuation Nasal Spray, Suspension 2 Sprays by Each Nostril route Once a day    HYDROcodone-acetaminophen (NORCO) 5-325 mg Oral Tablet Take 1 Tab by mouth Every night as needed for Pain for up to 12 doses    multivitamin Oral Tablet Take 1 Tab by mouth Once a day    timolol maleate (TIMOPTIC) 0.25 % Ophthalmic Drops Instill 1 Drop into right eye Twice daily     Allergies   Allergen Reactions    Amoxicillin Rash     Past Medical History:   Diagnosis Date    Breast mass, right     benign    Cataract     bilat    Family history of colon  cancer     Heartburn     HTN (hypertension)     Nausea with vomiting     sister with PONV    Obesity     Pulmonary emboli     only once in 1999 after her hysterectomy    Wears glasses     reading         Past Surgical History:   Procedure Laterality Date    HX BREAST LUMPECTOMY Right     benign    HX CATARACT REMOVAL  10/2013    left    HX HYSTERECTOMY  1999    fibroid    HX ROTATOR CUFF REPAIR  11/04/2013    right shoulder         Family History   Problem Relation Age of Onset    Stroke Mother     Hypertension Mother     Cancer Mother      colon    Stroke Father     Cancer Sister      ovaian    No Known Problems Brother     Breast  Cancer Maternal Grandmother     No Known Problems Maternal Grandfather     No Known Problems Paternal Grandmother     No Known Problems Paternal Grandfather     No Known Problems Daughter     No Known Problems Son     No Known Problems Maternal Aunt     No Known Problems Maternal Uncle     No Known Problems Paternal Aunt     No Known Problems Paternal Uncle     No Known Problems Other     Cervical Cancer Neg Hx     Colon Cancer Neg Hx     Liver Cancer Neg Hx     Lung Cancer Neg Hx     Lymphoma Neg Hx     Melanoma Neg Hx     Ovarian Cancer Neg Hx     Pancreatic Cancer Neg Hx     Thyroid Cancer Neg Hx     Uterine Cancer Neg Hx     Prostate Cancer Neg Hx     Leukemia Neg Hx          Social History     Social History    Marital status: Divorced     Spouse name: N/A    Number of children: 0    Years of education: N/A     Occupational History     [employer]     Social History Main Topics    Smoking status: Never Smoker    Smokeless tobacco: Never Used    Alcohol use No    Drug use: No    Sexual activity: Not on file     Other Topics Concern    Abuse/Domestic Violence No    Drives Yes    Special Diet No    Uses Cane No    Uses Walker No    Uses Wheelchair No    Right Hand Dominant Yes    Left Hand Dominant No    Ability To Walk 1 Flight Of  Steps Without Sob/Cp Yes    Routine Exercise Yes     Zumba class    Ability To Walk 2 Flight Of Steps Without Sob/Cp Yes    Ability To Do Own Adl's Yes     Social History Narrative         Review of Systems   Constitutional: Positive for fatigue.   HENT: Negative.    Respiratory: Negative.    Cardiovascular: Positive for chest pain (constant, right side of chest).   Gastrointestinal: Negative.    Musculoskeletal: Positive for arthralgias and myalgias.   Skin: Negative.    Hematological: Negative.        Objective:     BP 126/74   Pulse 55   Temp 36.2 C (97.2 F) (Tympanic)    Resp 16   Ht 1.651 m (5\' 5" )   Wt 76.6 kg (168 lb 12.8 oz)   SpO2 93%   Breastfeeding? No   BMI 28.09 kg/m2     Physical Exam   Constitutional: She is oriented to person, place, and time. She appears well-developed and well-nourished.   HENT:   Head: Normocephalic and atraumatic.   Right Ear: External ear normal.   Left Ear: External ear normal.   Eyes: Conjunctivae are normal. No scleral icterus.   Neck: Carotid bruit is not present. No thyromegaly present.   Cardiovascular: Normal rate, regular rhythm and normal heart sounds.    Pulmonary/Chest: Effort normal and breath sounds normal.  She exhibits tenderness.       Musculoskeletal: She exhibits no edema.        Thoracic back: She exhibits decreased range of motion, tenderness, pain and spasm. She exhibits no swelling, no edema, no deformity and no laceration.        Back:    Neurological: She is alert and oriented to person, place, and time. Coordination normal.   Nursing note and vitals reviewed.      Results for orders placed or performed during the hospital encounter of 01/14/16 (from the past 168 hour(s))   ECG 12-Lead    Collection Time: 01/14/16  7:57 AM   Result Value Ref Range    Ventricular rate 66 BPM    Atrial Rate 66 BPM    PR Interval 212 ms    QRS Duration 84 ms    QT Interval 394 ms    QTC Calculation 413 ms    Calculated P Axis 51 degrees    Calculated R Axis -7  degrees    Calculated T Axis 29 degrees   B-TYPE NATRIURETIC PEPTIDE    Collection Time: 01/14/16  9:33 AM   Result Value Ref Range    BNP 24 0 - 100 pg/mL   COMPREHENSIVE METABOLIC PROFILE - [facility] ONLY    Collection Time: 01/14/16  9:33 AM   Result Value Ref Range    SODIUM 139 136 - 145 mmol/L    POTASSIUM 4.1 3.4 - 5.1 mmol/L    CHLORIDE 106 101 - 111 mmol/L    CO2 TOTAL 26 22 - 32 mmol/L    ANION GAP 7 3 - 11 mmol/L    BUN 10 6 - 20 mg/dL    CREATININE 1.02 (H) 0.44 - 1.00 mg/dL    BUN/CREA RATIO 10 6 - 22    ESTIMATED GFR >60 >60 mL/min/1.32m2    ALBUMIN 4.4 3.5 - 5.0 g/dL    CALCIUM 9.9 8.8 - 10.2 mg/dL    GLUCOSE 141 (H) 70 - 110 mg/dL    ALKALINE PHOSPHATASE 66 38 - 126 U/L    ALT (SGPT) 17 14 - 54 U/L    AST (SGOT) 22 15 - 41 U/L    BILIRUBIN TOTAL 0.6 0.3 - 1.2 mg/dL    PROTEIN TOTAL 7.0 6.4 - 8.3 g/dL    ALBUMIN/GLOBULIN RATIO 1.7 0.8 - 2.0   C-REACTIVE PROTEIN(CRP),INFLAMMATION    Collection Time: 01/14/16  9:33 AM   Result Value Ref Range    CRP INFLAMMATION 2.9 <=10.0 mg/L   TROPONIN-I    Collection Time: 01/14/16  9:33 AM   Result Value Ref Range    TROPONIN I <0.03 <=0.06 ng/mL   CBC WITH DIFF    Collection Time: 01/14/16  9:33 AM   Result Value Ref Range    WBC 8.1 4.0 - 11.0 x103/uL    RBC 4.77 4.00 - 5.10 x106/uL    HGB 13.5 12.0 - 15.5 g/dL    HCT 40.2 36.0 - 45.0 %    MCV 84.2 82.0 - 97.0 fL    MCH 28.2 27.5 - 33.2 pg    MCHC 33.5 32.0 - 36.0 g/dL    RDW 14.3 11.0 - 16.0 %    PLATELETS 201 150 - 450 x103/uL    MPV 9.1 7.4 - 10.5 fL    NEUTROPHIL % 73 43 - 76 %    LYMPHOCYTE % 19 15 - 43 %    MONOCYTE % 7 5 - 12 %  EOSINOPHIL % 2 0 - 5 %    BASOPHIL % 0 0 - 3 %    NEUTROPHIL # 5.90 1.50 - 6.50 x103/uL    LYMPHOCYTE # 1.50 1.00 - 4.80 x103/uL    MONOCYTE # 0.50 0.20 - 0.90 x103/uL    EOSINOPHIL # 0.20 0.00 - 0.50 x103/uL    BASOPHIL # 0.00 0.00 - 0.10 x103/uL   TROPONIN-I    Collection Time: 01/14/16  1:37 PM   Result Value Ref Range    TROPONIN I <0.03 <=0.06 ng/mL   ]    Ortho  Exam    Assessment & Plan:       ICD-10-CM    1. Chest wall tenderness R07.89 Start ibuprofen-famotidine (DUEXIS) 800-26.6 mg Oral TabletTID with food   2. Acute upper back pain M54.9 ibuprofen-famotidine (DUEXIS) 800-26.6 mg Oral Tablet   3. Muscle strain of right upper back, subsequent encounter S29.012D cyclobenzaprine (FLEXERIL) 10 mg Oral Tablet TID PRN   4. BMI 28.0-28.9,adult Z68.28 Low fat/ low carb diet, weight loss, and exercise to reduce the above normal BMI.       Warm compresses  PT at home for now  Norco PRN severe pain

## 2016-02-08 ENCOUNTER — Telehealth: Payer: Self-pay | Admitting: Geriatric Medicine

## 2016-02-08 NOTE — Telephone Encounter (Signed)
Pt aware.  appt made.  Mariel Aloe, MA

## 2016-02-08 NOTE — Telephone Encounter (Signed)
-----   Message from Kayleen Memos, MD sent at 02/05/2016  3:49 PM EDT -----  Regarding: RE: pt advice  Contact: [phone]  Yes I can , make appointment   ----- Message -----     From: Mariel Aloe, MA     Sent: 02/04/2016   5:17 PM       To: Kayleen Memos, MD  Subject: pt advice                                        Pt left voicemail asking if you would give her another injection for trigger finger?  Please advise and will make pt an appointment.  Thank you.  Mariel Aloe, MA

## 2016-02-10 ENCOUNTER — Ambulatory Visit (INDEPENDENT_AMBULATORY_CARE_PROVIDER_SITE_OTHER): Payer: Medicare PPO | Admitting: Geriatric Medicine

## 2016-02-10 ENCOUNTER — Encounter (INDEPENDENT_AMBULATORY_CARE_PROVIDER_SITE_OTHER): Payer: Self-pay | Admitting: Geriatric Medicine

## 2016-02-10 VITALS — BP 124/82 | HR 52 | Temp 97.4°F | Ht 65.0 in | Wt 172.6 lb

## 2016-02-10 DIAGNOSIS — Z6828 Body mass index (BMI) 28.0-28.9, adult: Secondary | ICD-10-CM

## 2016-02-10 DIAGNOSIS — M6530 Trigger finger, unspecified finger: Secondary | ICD-10-CM | POA: Insufficient documentation

## 2016-02-10 DIAGNOSIS — M79644 Pain in right finger(s): Secondary | ICD-10-CM

## 2016-02-10 DIAGNOSIS — M65311 Trigger thumb, right thumb: Secondary | ICD-10-CM

## 2016-02-10 HISTORY — PX: 20550 - INJ SINGLE TENDON SHEATH/LIGAMENT, APONEUROSIS W/ US INTERP ONLY (AMB ONLY): 21000000152

## 2016-02-10 HISTORY — PX: INJECTION,TENDON SHEATH,LIG W/ US (AMB ONLY): 21000000152

## 2016-02-10 NOTE — Procedures (Signed)
NAME OF PROCEDURE:  right  Trigger finger joint Injection   ESTIMATED BLOOD LOSS: None  COMPLICATIONS:  were no complications    DESCRIPTION OF PROCEDURE:  Procedure was explained in details and she agreed, patient was placed in a seated position. The skin was prepped and draped in the usual sterile fashion using alcohol.  Anatomical landmarks were identified by palpation.  A 0.5inch 30-gauge needle was inserted just into tendon sheet around tendon nodule.  After negative aspiration, 2.67mL injectate consisting of 1.56mL 2% plain Lidocaine with 0.5 mL of 40mg /mL of Depomedrol was slowly injected.  There were no complications or paresthesias.   Patient tolerated the procedure well and all needles were removed intact.       Patient was observed in recovery and discharged home in stable condition.

## 2016-02-10 NOTE — Progress Notes (Signed)
Subjective:     Patient ID:  Carrie Escobar is an 70 y.o. female     Chief Complaint:    Chief Complaint   Patient presents with    Hand Pain       The history is provided by the patient and medical records. No language interpreter was used.     Pt presents today for a right thumb tigger finger. Pt states she don't remember injuring herself. Pt states it has been a "tightness" feeling for 4 months. Pt states she soaks her thumb in warm water with epsom salt; temporaily relief, she tried OTC NSAIDs/ thumb brace w/o help. Upon observation pt can bend her thumb but is "sore". Pt states when she put any pressure on her thumb its "sore".    Past Medical History  Current Outpatient Prescriptions   Medication Sig    amLODIPine (NORVASC) 10 mg Oral Tablet TAKE 1 TABLET DAILY    bisoprolol-hydroCHLOROthiazide (ZIAC) 10-6.25 mg Oral Tablet TAKE 1 TABLET DAILY    calcium citrate-vitamin D3 (CITRACAL) 200 mg calcium -250 unit Oral Tablet Take 1 Tab by mouth Once a day    cyclobenzaprine (FLEXERIL) 10 mg Oral Tablet Take 1 Tab (10 mg total) by mouth Three times a day as needed for Muscle spasms    Flaxseed Oil 1,000 mg Oral Capsule Take by mouth    fluticasone (FLONASE) 50 mcg/actuation Nasal Spray, Suspension 2 Sprays by Each Nostril route Once a day    HYDROcodone-acetaminophen (NORCO) 5-325 mg Oral Tablet Take 1 Tab by mouth Every night as needed for Pain for up to 12 doses    ibuprofen-famotidine (DUEXIS) 800-26.6 mg Oral Tablet Take 1 Tab by mouth Three times daily with meals PRN pain    multivitamin Oral Tablet Take 1 Tab by mouth Once a day    timolol maleate (TIMOPTIC) 0.25 % Ophthalmic Drops Instill 1 Drop into right eye Twice daily     Allergies   Allergen Reactions    Amoxicillin Rash     Past Medical History:   Diagnosis Date    Breast mass, right     benign    Cataract     bilat    Family history of colon cancer     Heartburn     HTN (hypertension)     Nausea with vomiting     sister with  PONV    Obesity     Pulmonary emboli     only once in 1999 after her hysterectomy    Wears glasses     reading         Past Surgical History:   Procedure Laterality Date    HX BREAST LUMPECTOMY Right     benign    HX CATARACT REMOVAL  10/2013    left    HX HYSTERECTOMY  1999    fibroid    HX ROTATOR CUFF REPAIR  11/04/2013    right shoulder         Family History   Problem Relation Age of Onset    Stroke Mother     Hypertension Mother     Cancer Mother      colon    Stroke Father     Cancer Sister      ovaian    No Known Problems Brother     Breast Cancer Maternal Grandmother     No Known Problems Maternal Grandfather     No Known Problems Paternal Grandmother     No  Known Problems Paternal Grandfather     No Known Problems Daughter     No Known Problems Son     No Known Problems Maternal Aunt     No Known Problems Maternal Uncle     No Known Problems Paternal Aunt     No Known Problems Paternal Uncle     No Known Problems Other     Cervical Cancer Neg Hx     Colon Cancer Neg Hx     Liver Cancer Neg Hx     Lung Cancer Neg Hx     Lymphoma Neg Hx     Melanoma Neg Hx     Ovarian Cancer Neg Hx     Pancreatic Cancer Neg Hx     Thyroid Cancer Neg Hx     Uterine Cancer Neg Hx     Prostate Cancer Neg Hx     Leukemia Neg Hx          Social History     Social History    Marital status: Divorced     Spouse name: N/A    Number of children: 0    Years of education: N/A     Occupational History     [employer]     Social History Main Topics    Smoking status: Never Smoker    Smokeless tobacco: Never Used    Alcohol use No    Drug use: No    Sexual activity: Not on file     Other Topics Concern    Abuse/Domestic Violence No    Drives Yes    Special Diet No    Uses Cane No    Uses Walker No    Uses Wheelchair No    Right Hand Dominant Yes    Left Hand Dominant No    Ability To Walk 1 Flight Of Steps Without Sob/Cp Yes    Routine Exercise Yes     Zumba class    Ability To Walk 2  Flight Of Steps Without Sob/Cp Yes    Ability To Do Own Adl's Yes     Social History Narrative         Review of Systems  Constitutional: Negative.   HENT: Negative.   Musculoskeletal: Positive for arthralgias.   Skin: Negative.     Objective:     BP 124/82   Pulse 52   Temp 36.3 C (97.4 F)   Ht 1.651 m (5\' 5" )   Wt 78.3 kg (172 lb 9.6 oz)   SpO2 99%   BMI 28.72 kg/m2     Physical Exam  Constitutional: She is oriented to person, place, and time. She appears well-developed and well-nourished.   HENT:   Head: Normocephalic and atraumatic.   Right Ear: External ear normal.   Left Ear: External ear normal.   Pulmonary/Chest: Effort normal.   Musculoskeletal:   Right hand: She exhibits tenderness and swelling.   Hands:  Neurological: She is alert and oriented to person, place, and time.   Nursing note and vitals reviewed.        Ortho Exam    Assessment & Plan:       ICD-10-CM    1. Trigger finger of right thumb M65.311 Tendon Sheath Injection[20550]   2. Thumb pain, right M79.644 Tendon Sheath Injection[20550]   3. BMI 28.0-28.9,adult Z68.28 Low fat/ low carb diet, weight loss, and exercise to reduce the above normal BMI.       NAME OF PROCEDURE:  right  Trigger finger joint Injection   ESTIMATED BLOOD LOSS: None  COMPLICATIONS:  were no complications    DESCRIPTION OF PROCEDURE:  Procedure was explained in details and she agreed, patient was placed in a seated position. The skin was prepped and draped in the usual sterile fashion using alcohol.  Anatomical landmarks were identified by palpation.  A 0.5inch 30-gauge needle was inserted just into tendon sheet around tendon nodule.  After negative aspiration, 2.17mL injectate consisting of 1.34mL 2% plain Lidocaine with 0.5 mL of 40mg /mL of Depomedrol was slowly injected.  There were no complications or paresthesias.   Patient tolerated the procedure well and all needles were removed intact.       Patient was observed in recovery and discharged home in stable  condition.    Continue OTC NSAIDs, brace and warm compresses.

## 2016-02-10 NOTE — Progress Notes (Signed)
Pt is 70 year old female. Pt states that she is here to have a cortazone shot for her right thumb because she has had problems with it for years. Pt states that she was told to come in for it. No other questions or concerns. Talbert Forest, MA

## 2016-03-25 ENCOUNTER — Ambulatory Visit (INDEPENDENT_AMBULATORY_CARE_PROVIDER_SITE_OTHER): Payer: Medicare PPO | Admitting: Geriatric Medicine

## 2016-03-25 ENCOUNTER — Encounter (INDEPENDENT_AMBULATORY_CARE_PROVIDER_SITE_OTHER): Payer: Self-pay | Admitting: Geriatric Medicine

## 2016-03-25 VITALS — BP 139/89 | HR 40 | Temp 97.3°F | Ht 65.0 in | Wt 172.6 lb

## 2016-03-25 DIAGNOSIS — I10 Essential (primary) hypertension: Secondary | ICD-10-CM

## 2016-03-25 DIAGNOSIS — Z6828 Body mass index (BMI) 28.0-28.9, adult: Secondary | ICD-10-CM

## 2016-03-25 NOTE — Progress Notes (Signed)
Subjective:     Patient ID:  Carrie Escobar is an 70 y.o. female     Chief Complaint:    Chief Complaint   Patient presents with    Follow Up 6 Months       HPI     Carrie Escobar is a 70 y.o. female who reports with hypertension.  Hypertension ROS: taking medications as instructed, no medication side effects noted, home BP monitoring in range of [id number] systolic over [id number] diastolic, no TIAs, no chest pain on exertion, no dyspnea on exertion, no swelling of ankles, no orthostatic dizziness or lightheadedness, no orthopnea or paroxysmal nocturnal dyspnea, no palpitations, no intermittent claudication symptoms. Pt states that she does adhere to a low salt diet, however, she states she does "eat out a lot." Patients BP today is "128/89."    Patient has a h/o trigger finger of the right thumb. During the patients last visit on 02/10/16, a trigger point injection was given. Patient today states that the injection helped her a lot. Pt is no longer taking Duexis.   Comprehensive Metabolic Profile    Lab Results   Component Value Date/Time    SODIUM 139 01/14/2016 09:33 AM    SODIUM 142 03/10/2015 12:15 PM    POTASSIUM 4.1 01/14/2016 09:33 AM    POTASSIUM 4.7 03/10/2015 12:15 PM    CHLORIDE 106 01/14/2016 09:33 AM    CHLORIDE 105 03/10/2015 12:15 PM    CO2 26 01/14/2016 09:33 AM    CO2 28 03/10/2015 12:15 PM    ANIONGAP 7 01/14/2016 09:33 AM    ANIONGAP 9 03/10/2015 12:15 PM    BUN 10 01/14/2016 09:33 AM    BUN 10 03/10/2015 12:15 PM    CREATININE 1.02 (H) 01/14/2016 09:33 AM    CREATININE 0.98 03/10/2015 12:15 PM    Lab Results   Component Value Date/Time    CALCIUM 9.9 01/14/2016 09:33 AM    CALCIUM 9.6 03/10/2015 12:15 PM    PHOSPHORUS 2.8 09/11/2015 03:46 PM    ALBUMIN 4.4 01/14/2016 09:33 AM    ALBUMIN 4.4 03/10/2015 12:15 PM    TOTALPROTEIN 7.0 01/14/2016 09:33 AM    TOTALPROTEIN 6.6 03/10/2015 12:15 PM    ALKPHOS 66 01/14/2016 09:33 AM    ALKPHOS 63 03/10/2015 12:15 PM    AST 22 01/14/2016 09:33 AM    AST  22 03/10/2015 12:15 PM    ALT 17 01/14/2016 09:33 AM    ALT 18 03/10/2015 12:15 PM    TOTBILIRUBIN 0.6 01/14/2016 09:33 AM    TOTBILIRUBIN 0.8 03/10/2015 12:15 PM          Lab Results   Component Value Date    CHOLESTEROL 196 03/10/2015    HDLCHOL 66 03/10/2015    LDLCHOL 110 03/10/2015    TRIG 101 (U) 03/10/2015          Past Medical History  Current Outpatient Prescriptions   Medication Sig    amLODIPine (NORVASC) 10 mg Oral Tablet TAKE 1 TABLET DAILY    bisoprolol-hydroCHLOROthiazide (ZIAC) 10-6.25 mg Oral Tablet TAKE 1 TABLET DAILY    calcium citrate-vitamin D3 (CITRACAL) 200 mg calcium -250 unit Oral Tablet Take 1 Tab by mouth Once a day    Flaxseed Oil 1,000 mg Oral Capsule Take by mouth    fluticasone (FLONASE) 50 mcg/actuation Nasal Spray, Suspension 2 Sprays by Each Nostril route Once a day    multivitamin Oral Tablet Take 1 Tab by mouth Once a day  timolol maleate (TIMOPTIC) 0.25 % Ophthalmic Drops Instill 1 Drop into right eye Twice daily     Allergies   Allergen Reactions    Amoxicillin Rash     Past Medical History:   Diagnosis Date    Breast mass, right     benign    Cataract     bilat    Family history of colon cancer     Heartburn     HTN (hypertension)     Nausea with vomiting     sister with PONV    Obesity     Pulmonary emboli     only once in 1999 after her hysterectomy    Wears glasses     reading     Past Surgical History:   Procedure Laterality Date    HX BREAST LUMPECTOMY Right     benign    HX CATARACT REMOVAL  10/2013    left    HX HYSTERECTOMY  1999    fibroid    HX ROTATOR CUFF REPAIR  11/04/2013    right shoulder    INJECTION,TENDON SHEATH,LIG (AMB ONLY)  02/10/2016     Family History   Problem Relation Age of Onset    Stroke Mother     Hypertension Mother     Cancer Mother      colon    Stroke Father     Cancer Sister      ovaian    No Known Problems Brother     Breast Cancer Maternal Grandmother     No Known Problems Maternal Grandfather     No Known  Problems Paternal Grandmother     No Known Problems Paternal Grandfather     No Known Problems Daughter     No Known Problems Son     No Known Problems Maternal Aunt     No Known Problems Maternal Uncle     No Known Problems Paternal Aunt     No Known Problems Paternal Uncle     No Known Problems Other     Cervical Cancer Neg Hx     Colon Cancer Neg Hx     Liver Cancer Neg Hx     Lung Cancer Neg Hx     Lymphoma Neg Hx     Melanoma Neg Hx     Ovarian Cancer Neg Hx     Pancreatic Cancer Neg Hx     Thyroid Cancer Neg Hx     Uterine Cancer Neg Hx     Prostate Cancer Neg Hx     Leukemia Neg Hx      Social History     Social History    Marital status: Divorced     Spouse name: N/A    Number of children: 0    Years of education: N/A     Occupational History     [employer]     Social History Main Topics    Smoking status: Never Smoker    Smokeless tobacco: Never Used    Alcohol use No    Drug use: No    Sexual activity: Not on file     Other Topics Concern    Abuse/Domestic Violence No    Drives Yes    Special Diet No    Uses Cane No    Uses Walker No    Uses Wheelchair No    Right Hand Dominant Yes    Left Hand Dominant No    Ability To Walk 1 Flight  Of Steps Without Sob/Cp Yes    Routine Exercise Yes     Zumba class    Ability To Walk 2 Flight Of Steps Without Sob/Cp Yes    Ability To Do Own Adl's Yes     Social History Narrative     Review of Systems   Constitutional: Negative.    HENT: Negative.    Respiratory: Negative.    Cardiovascular: Negative.    Gastrointestinal: Negative.    Genitourinary: Negative.    Skin: Negative.    Neurological: Negative.    Hematological: Negative.        Objective:     BP 139/89   Pulse 40   Temp 36.3 C (97.3 F)   Ht 1.651 m (5\' 5" )   Wt 78.3 kg (172 lb 9.6 oz)   SpO2 99%   BMI 28.72 kg/m2     Physical Exam    Appearance healthy, alert, cooperative, smiling, bright and interactive.  General exam BP noted to be mildly elevated today in office, S1, S2  normal, no gallop, no murmur, chest clear, no JVD, no HSM, no edema  Neck: no carotid bruit, no thyromegaly, no thyroid mass, supple,   Abd: +BS, ND/NT, no Organomegaly, no abd bruit, no mass/hernia  Gait: WNL    Ortho Exam    Assessment & Plan:       ICD-10-CM    1. Essential hypertension I10    2. BMI 28.0-28.9,adult Z68.28        Assessment:    Hypertension [place] controlled, no significant medication side effects noted, borderline controlled, needs to follow diet more regularly.     Plan:   current treatment plan is effective, no change in therapy, lab results and schedule of future lab studies reviewed with patient, orders and follow up as documented in [facility], reviewed diet, exercise and weight control, recommended sodium restriction, cardiovascular risk and specific lipid/LDL goals reviewed, reviewed medications and side effects in detail, reviewed potential future medication changes and side effects, use of aspirin to prevent MI and TIAs discussed.  Low fat/ low carb diet, weight loss, and exercise to reduce the above normal BMI.      Patient was asked to monitor salt intake. Pt to continue monitoring the BP at home, exercise and watch diet. Will not change medication at this time.   Will check the pt's cholesterol in 6 months.   Pt will call when she needs refills.

## 2016-03-25 NOTE — Nursing Note (Signed)
Patient is a 70 y.o. female who presents today for a follow up on blood pressure. Patient does check her blood pressure at home; it ranges in the [id number] systolic over 80 diastolic range. Patient currently has the following symptoms; no symptoms. Patient does adhere to a low salt diet.  Talbert Forest, MA

## 2016-05-18 ENCOUNTER — Other Ambulatory Visit (INDEPENDENT_AMBULATORY_CARE_PROVIDER_SITE_OTHER): Payer: Self-pay

## 2016-05-25 ENCOUNTER — Other Ambulatory Visit: Payer: Self-pay | Admitting: Geriatric Medicine

## 2016-08-26 ENCOUNTER — Other Ambulatory Visit: Payer: Self-pay | Admitting: Geriatric Medicine

## 2016-08-26 DIAGNOSIS — Z1239 Encounter for other screening for malignant neoplasm of breast: Secondary | ICD-10-CM

## 2016-09-16 ENCOUNTER — Encounter (HOSPITAL_BASED_OUTPATIENT_CLINIC_OR_DEPARTMENT_OTHER): Payer: Self-pay

## 2016-09-16 ENCOUNTER — Ambulatory Visit (HOSPITAL_BASED_OUTPATIENT_CLINIC_OR_DEPARTMENT_OTHER)
Admission: RE | Admit: 2016-09-16 | Discharge: 2016-09-16 | Disposition: A | Discharge status: Home / Self Care | Payer: Medicare PPO | Source: Ambulatory Visit | Attending: Geriatric Medicine | Admitting: Geriatric Medicine

## 2016-09-16 DIAGNOSIS — Z1239 Encounter for other screening for malignant neoplasm of breast: Secondary | ICD-10-CM

## 2016-09-16 DIAGNOSIS — Z1231 Encounter for screening mammogram for malignant neoplasm of breast: Secondary | ICD-10-CM | POA: Insufficient documentation

## 2016-09-26 ENCOUNTER — Other Ambulatory Visit (HOSPITAL_BASED_OUTPATIENT_CLINIC_OR_DEPARTMENT_OTHER): Payer: Medicare PPO | Attending: Geriatric Medicine

## 2016-09-26 ENCOUNTER — Encounter (INDEPENDENT_AMBULATORY_CARE_PROVIDER_SITE_OTHER): Payer: Self-pay | Admitting: Geriatric Medicine

## 2016-09-26 ENCOUNTER — Ambulatory Visit (INDEPENDENT_AMBULATORY_CARE_PROVIDER_SITE_OTHER): Payer: Medicare PPO | Admitting: Geriatric Medicine

## 2016-09-26 VITALS — BP 144/88 | HR 67 | Temp 97.1°F | Resp 12 | Ht 65.5 in | Wt 167.0 lb

## 2016-09-26 DIAGNOSIS — I10 Essential (primary) hypertension: Secondary | ICD-10-CM

## 2016-09-26 DIAGNOSIS — Z79899 Other long term (current) drug therapy: Secondary | ICD-10-CM

## 2016-09-26 DIAGNOSIS — Z13220 Encounter for screening for lipoid disorders: Secondary | ICD-10-CM

## 2016-09-26 DIAGNOSIS — Z2821 Immunization not carried out because of patient refusal: Secondary | ICD-10-CM

## 2016-09-26 DIAGNOSIS — Z6827 Body mass index (BMI) 27.0-27.9, adult: Secondary | ICD-10-CM

## 2016-09-26 LAB — LIPID PANEL
CHOL/HDL RATIO: 3
CHOLESTEROL: 206 mg/dL — ABNORMAL HIGH (ref 120–199)
HDL CHOL: 68 mg/dL — ABNORMAL HIGH (ref 45–65)
HDL CHOL: 68 mg/dL — ABNORMAL HIGH (ref 45–65)
LDL CALC: 123 mg/dL (ref <=130)
TRIGLYCERIDES: 77 mg/dL (ref 35–135)
VLDL CALC: 15 mg/dL (ref 5–35)

## 2016-09-26 LAB — COMPREHENSIVE METABOLIC PROFILE - BMC/JMC ONLY
ALBUMIN/GLOBULIN RATIO: 1.9 (ref 0.8–2.0)
ALBUMIN: 4.3 g/dL (ref 3.5–5.0)
ALKALINE PHOSPHATASE: 67 U/L (ref 38–126)
ALT (SGPT): 16 U/L (ref 14–54)
ANION GAP: 6 mmol/L (ref 3–11)
AST (SGOT): 22 U/L (ref 15–41)
BILIRUBIN TOTAL: 0.3 mg/dL (ref 0.3–1.2)
BUN/CREA RATIO: 8 (ref 6–22)
BUN: 8 mg/dL (ref 6–20)
CALCIUM: 9.9 mg/dL (ref 8.8–10.2)
CHLORIDE: 105 mmol/L (ref 101–111)
CO2 TOTAL: 30 mmol/L (ref 22–32)
CREATININE: 0.96 mg/dL (ref 0.44–1.00)
ESTIMATED GFR: >60 mL/min/1.73m?2 (ref >60)
ESTIMATED GFR: >60 mL/min/{1.73_m2} (ref >60)
GLUCOSE: 111 mg/dL — ABNORMAL HIGH (ref 70–110)
POTASSIUM: 5.1 mmol/L (ref 3.4–5.1)
PROTEIN TOTAL: 6.6 g/dL (ref 6.4–8.3)
SODIUM: 141 mmol/L (ref 136–145)

## 2016-09-26 NOTE — Nursing Note (Signed)
Carrie Escobar is a 70 y.o. female who presents today for a follow up on blood pressure and cholesterol. Patient does check her blood pressure at home; it ranges in the [id number] systolic over [id number] diastolic range. Patient currently has the following symptoms; no symptoms. Patient does not adhere to a low fat/salt diet. Patients last Lipid was performed on 02/2015 with the result of   CHOLESTEROL 196 120 - 199 mg/dL Final   TRIGLYCERIDES 101 (U) <150 mg/dL Final   Delta: 82 on 01/15/14-1039   HDL-CHOLESTEROL 66 >39 mg/dL Final   LDL (CALCULATED) 110 <130 mg/dL Final   VLDL (CALCULATED) 20 5 - 35 mg/dL Final   CHOL/HDL RATIO 3.0  Final   Lucille Passy, MA

## 2016-09-26 NOTE — Progress Notes (Signed)
Subjective:     Patient ID:  Carrie Escobar is an 70 y.o. female     Chief Complaint:    Chief Complaint   Patient presents with   . Hypertension   . Routine Medication Monitoring       The history is provided by the patient and medical records. No language interpreter was used.     Carrie Escobar is a 70 y.o. female who reports with hypertension and hyperlipidemia.  Hypertension/Hyperlipidemia ROS: taking medications as instructed, no medication side effects noted, home BP monitoring in range of A999333 systolic over [id number] diastolic, no TIAs, no chest pain on exertion, no dyspnea on exertion, no swelling of ankles, no orthostatic dizziness or lightheadedness, no orthopnea or paroxysmal nocturnal dyspnea, no palpitations, no intermittent claudication symptoms.    Patient does not adhere to a low fat/salt diet. Pt had [facility] screening testing including carotid artery disease with results of left PSV 11-125 cm/s and right PSV 11-125 cm/s, A-Fib; non detected, abdominal aortic aneurysm less than 3 cm/Normal, Peripheral arterial disease Left side 1.04, right side 1.07/NOrmal, and BMI of 29 with moderate risk.  Fall Risk Assessment   Do you feel unsteady when standing or walking? No  byKhajavi,Lacresha Fusilier,MD  at12/18/171131    Do you worry about falling? No  byKhajavi,Cagney Steenson,MD  at12/18/171131    Have you fallen in the past year           Lab Results   Component Value Date    CHOLESTEROL 196 03/10/2015    HDLCHOL 66 03/10/2015    LDLCHOL 110 03/10/2015    TRIG 101 (U) 03/10/2015      Comprehensive Metabolic Profile    Lab Results   Component Value Date/Time    SODIUM 139 01/14/2016 09:33 AM    SODIUM 142 03/10/2015 12:15 PM    POTASSIUM 4.1 01/14/2016 09:33 AM    POTASSIUM 4.7 03/10/2015 12:15 PM    CHLORIDE 106 01/14/2016 09:33 AM    CHLORIDE 105 03/10/2015 12:15 PM    CO2 26 01/14/2016 09:33 AM    CO2 28 03/10/2015 12:15 PM    ANIONGAP 7 01/14/2016 09:33 AM    ANIONGAP 9 03/10/2015 12:15 PM     BUN 10 01/14/2016 09:33 AM    BUN 10 03/10/2015 12:15 PM    CREATININE 1.02 (H) 01/14/2016 09:33 AM    CREATININE 0.98 03/10/2015 12:15 PM    Lab Results   Component Value Date/Time    CALCIUM 9.9 01/14/2016 09:33 AM    CALCIUM 9.6 03/10/2015 12:15 PM    PHOSPHORUS 2.8 09/11/2015 03:46 PM    ALBUMIN 4.4 01/14/2016 09:33 AM    ALBUMIN 4.4 03/10/2015 12:15 PM    TOTALPROTEIN 7.0 01/14/2016 09:33 AM    TOTALPROTEIN 6.6 03/10/2015 12:15 PM    ALKPHOS 66 01/14/2016 09:33 AM    ALKPHOS 63 03/10/2015 12:15 PM    AST 22 01/14/2016 09:33 AM    AST 22 03/10/2015 12:15 PM    ALT 17 01/14/2016 09:33 AM    ALT 18 03/10/2015 12:15 PM    TOTBILIRUBIN 0.6 01/14/2016 09:33 AM    TOTBILIRUBIN 0.8 03/10/2015 12:15 PM          Past Medical History  Current Outpatient Prescriptions   Medication Sig   . amLODIPine (NORVASC) 10 mg Oral Tablet TAKE 1 TABLET DAILY   . aspirin (ECOTRIN) 81 mg Oral Tablet, Delayed Release (E.C.) Take 81 mg by mouth Once a day   . bisoprolol-hydroCHLOROthiazide [facility]) 10-6.25  mg Oral Tablet TAKE 1 TABLET DAILY   . calcium citrate-vitamin D3 (CITRACAL) 200 mg calcium -250 unit Oral Tablet Take 1 Tab by mouth Once a day   . Flaxseed Oil 1,000 mg Oral Capsule Take by mouth   . fluticasone (FLONASE) 50 mcg/actuation Nasal Spray, Suspension 2 Sprays by Each Nostril route Once a day   . multivitamin Oral Tablet Take 1 Tab by mouth Once a day   . timolol maleate (TIMOPTIC) 0.25 % Ophthalmic Drops Instill 1 Drop into right eye Twice daily     Allergies   Allergen Reactions   . Amoxicillin Rash     Past Medical History:   Diagnosis Date   . Breast mass, right     benign   . Cataract     bilat   . Family history of colon cancer    . Heartburn    . HTN (hypertension)    . Nausea with vomiting     sister with PONV   . Obesity    . Pulmonary emboli     only once in 1999 after her hysterectomy   . Wears glasses     reading         Past Surgical History:   Procedure Laterality Date   . HX BREAST LUMPECTOMY Right     benign    . HX CATARACT REMOVAL  10/2013    left   . HX HYSTERECTOMY  1999    fibroid   . HX ROTATOR CUFF REPAIR  11/04/2013    right shoulder   . INJECTION,TENDON SHEATH,LIG (AMB ONLY)  02/10/2016         Family Medical History     Problem Relation (Age of Onset)    Breast Cancer Maternal Grandmother    Cancer Mother, Sister    Hypertension Mother    No Known Problems Brother, Maternal Grandfather, Paternal Grandmother, Paternal 25, Daughter, Son, Maternal 12, Maternal Uncle, Paternal 3, Paternal Uncle, Other    Stroke Mother, Father            Social History     Social History   . Marital status: Divorced     Spouse name: N/A   . Number of children: 0   . Years of education: N/A     Occupational History   .  [employer]     Social History Main Topics   . Smoking status: Never Smoker   . Smokeless tobacco: Never Used   . Alcohol use No   . Drug use: No   . Sexual activity: Not on file     Other Topics Concern   . Abuse/Domestic Violence No   . Drives Yes   . Special Diet No   . Uses Cane No   . Uses Walker No   . Uses Wheelchair No   . Right Hand Dominant Yes   . Left Hand Dominant No   . Ability To Walk 1 Flight Of Steps Without Sob/Cp Yes   . Routine Exercise Yes     Zumba class   . Ability To Walk 2 Flight Of Steps Without Sob/Cp Yes   . Ability To Do Own Adl's Yes     Social History Narrative         Review of Systems   Review of Systems   Constitutional: Negative.    HENT: Negative.    Respiratory: Negative.    Cardiovascular: Negative.    Gastrointestinal: Negative.    Genitourinary: Negative.  Skin: Negative.    Neurological: Negative.    Hematological: Negative.        Objective:     BP (!) 144/88  Pulse 67  Temp 36.2 C (97.1 F) (Tympanic)   Resp 12  Ht 1.664 m (5' 5.5")  Wt 75.8 kg (167 lb)  SpO2 94%  Breastfeeding? No  BMI 27.37 kg/m2     Physical Exam     Appearance healthy, alert, cooperative, smiling, bright and interactive.  General exam BP noted to be mildly elevated today in office, S1, S2  normal, no gallop, no murmur, chest clear, no JVD, no HSM, no edema  Neck: no carotid bruit, no thyromegaly, no thyroid mass, supple,   Abd: +BS, ND/NT, no Organomegaly, no abd bruit, no mass/hernia  Gait: WNL    Ortho Exam    Assessment & Plan:       ICD-10-CM    1. HTN (hypertension) I10 COMPREHENSIVE METABOLIC PROFILE - [facility] ONLY   2. Medication management Z79.899 COMPREHENSIVE METABOLIC PROFILE - [facility] ONLY   3. Screening for lipid disorders Z13.220 LIPID PANEL   4. BMI 27.0-27.9,adult Z68.27    5. Influenza vaccination declined Z28.21    6. Pneumococcal vaccination declined Z28.21          Pt declines tetanus/ influenza/pneumonia vaccinations.   Assessment:    Hypertension [place] controlled, no significant medication side effects noted, borderline controlled, needs to follow diet more regularly.     Plan:   current treatment plan is effective, no change in therapy, lab results and schedule of future lab studies reviewed with patient, orders and follow up as documented in [place], reviewed diet, exercise and weight control, recommended sodium restriction, cardiovascular risk and specific lipid/LDL goals reviewed, reviewed medications and side effects in detail, reviewed potential future medication changes and side effects, use of aspirin to prevent MI and TIAs discussed.    BMI addressed: Advised on diet, weight loss, and exercise to reduce above normal BMI.

## 2016-10-28 ENCOUNTER — Other Ambulatory Visit: Payer: Self-pay | Admitting: Internal Medicine

## 2016-10-28 DIAGNOSIS — Z1231 Encounter for screening mammogram for malignant neoplasm of breast: Secondary | ICD-10-CM

## 2016-11-11 ENCOUNTER — Telehealth: Payer: Self-pay | Admitting: Geriatric Medicine

## 2016-11-11 NOTE — Telephone Encounter (Signed)
Spoke with pt and she is aware to CCP after BP log was viewed by DR. Khajavi.  Also informed pt per Dr. Tempie Donning ok to take OTC supplement biotin plus hyalyronic acid and Hair, Skin, Nails vitamin.  Pt aware and in agreement.  Mariel Aloe, MA

## 2016-11-11 NOTE — Telephone Encounter (Signed)
Spoke with pt and she i

## 2016-12-02 ENCOUNTER — Ambulatory Visit
Admission: RE | Admit: 2016-12-02 | Discharge: 2016-12-02 | Disposition: A | Discharge status: Home / Self Care | Payer: Medicare Other | Source: Ambulatory Visit | Attending: Internal Medicine | Admitting: Internal Medicine

## 2016-12-02 ENCOUNTER — Other Ambulatory Visit (INDEPENDENT_AMBULATORY_CARE_PROVIDER_SITE_OTHER): Payer: Self-pay | Admitting: Geriatric Medicine

## 2016-12-02 DIAGNOSIS — Z1231 Encounter for screening mammogram for malignant neoplasm of breast: Secondary | ICD-10-CM | POA: Diagnosis not present

## 2016-12-08 ENCOUNTER — Other Ambulatory Visit: Payer: Self-pay | Admitting: Geriatric Medicine

## 2016-12-08 NOTE — Telephone Encounter (Signed)
Please review and sign.  Pt last seen 09/26/16 for PHY.  Thank you.  Mariel Aloe, MA

## 2016-12-09 MED ORDER — BISOPROLOL 10 MG-HYDROCHLOROTHIAZIDE 6.25 MG TABLET
ORAL_TABLET | ORAL | 0 refills | Status: DC
Start: 2016-12-09 — End: 2017-03-15

## 2016-12-09 NOTE — Telephone Encounter (Signed)
Please review and sign, thank you. Kennetta Pavlovic, MA

## 2017-03-02 ENCOUNTER — Other Ambulatory Visit (INDEPENDENT_AMBULATORY_CARE_PROVIDER_SITE_OTHER): Payer: Self-pay | Admitting: Geriatric Medicine

## 2017-03-15 ENCOUNTER — Other Ambulatory Visit: Payer: Self-pay | Admitting: Geriatric Medicine

## 2017-03-15 MED ORDER — BISOPROLOL 10 MG-HYDROCHLOROTHIAZIDE 6.25 MG TABLET
ORAL_TABLET | ORAL | 0 refills | Status: DC
Start: 2017-03-15 — End: 2017-06-14

## 2017-03-15 NOTE — Telephone Encounter (Signed)
Please review and sign.  Thank you.  Welma Mccombs [place], MA

## 2017-03-22 ENCOUNTER — Ambulatory Visit (INDEPENDENT_AMBULATORY_CARE_PROVIDER_SITE_OTHER): Payer: Medicare PPO | Admitting: Geriatric Medicine

## 2017-03-22 ENCOUNTER — Encounter (INDEPENDENT_AMBULATORY_CARE_PROVIDER_SITE_OTHER): Payer: Self-pay | Admitting: Geriatric Medicine

## 2017-03-22 ENCOUNTER — Other Ambulatory Visit (HOSPITAL_BASED_OUTPATIENT_CLINIC_OR_DEPARTMENT_OTHER): Payer: Medicare PPO | Attending: Geriatric Medicine

## 2017-03-22 VITALS — BP 118/78 | HR 60 | Temp 97.5°F | Ht 65.0 in | Wt 147.0 lb

## 2017-03-22 DIAGNOSIS — Z6824 Body mass index (BMI) 24.0-24.9, adult: Secondary | ICD-10-CM

## 2017-03-22 DIAGNOSIS — I10 Essential (primary) hypertension: Secondary | ICD-10-CM

## 2017-03-22 DIAGNOSIS — Z79899 Other long term (current) drug therapy: Secondary | ICD-10-CM

## 2017-03-22 DIAGNOSIS — M899 Disorder of bone, unspecified: Secondary | ICD-10-CM

## 2017-03-22 LAB — BASIC METABOLIC PANEL
ANION GAP: 10 mmol/L (ref 3–11)
BUN/CREA RATIO: 14 (ref 6–22)
BUN: 15 mg/dL (ref 6–20)
CALCIUM: 9.8 mg/dL (ref 8.8–10.2)
CHLORIDE: 105 mmol/L (ref 101–111)
CO2 TOTAL: 26 mmol/L (ref 22–32)
CO2 TOTAL: 26 mmol/L (ref 22–32)
CREATININE: 1.04 mg/dL — ABNORMAL HIGH (ref 0.44–1.00)
CREATININE: 1.04 mg/dL — ABNORMAL HIGH (ref 0.44–1.00)
ESTIMATED GFR: >60 mL/min/1.73mˆ2 (ref >60)
GLUCOSE: 99 mg/dL (ref 70–110)
POTASSIUM: 5.1 mmol/L (ref 3.4–5.1)
SODIUM: 141 mmol/L (ref 136–145)

## 2017-03-22 NOTE — Progress Notes (Signed)
Subjective:     Patient ID:  Carrie Escobar is an 71 y.o. female     Chief Complaint:    Chief Complaint   Patient presents with    Follow Up 6 Months    Hypertension       The history is provided by the patient and medical records. No language interpreter was used.       Carrie Escobar is a 71 y.o. female with hypertension.    Hypertension ROS: taking medications as instructed, no medication side effects noted, home BP monitoring in range of 045W systolic over 09W diastolic, no TIAs, no chest pain on exertion, no dyspnea on exertion, no swelling of ankles, no orthostatic dizziness or lightheadedness, no orthopnea or paroxysmal nocturnal dyspnea, no palpitations, no intermittent claudication symptoms.   Last DEXA was on 01/2014---> WNL  She declined shingles vaccine  Lab Results   Component Value Date    CHOLESTEROL 206 (H) 09/26/2016    CHOLESTEROL 196 03/10/2015    HDLCHOL 68 (H) 09/26/2016    HDLCHOL 66 03/10/2015    LDLCHOL 123 09/26/2016    LDLCHOL 110 03/10/2015    TRIG 77 09/26/2016    TRIG 101 (U) 03/10/2015      Comprehensive Metabolic Profile    Lab Results   Component Value Date/Time    SODIUM 141 09/26/2016 10:09 AM    SODIUM 142 03/10/2015 12:15 PM    POTASSIUM 5.1 09/26/2016 10:09 AM    POTASSIUM 4.7 03/10/2015 12:15 PM    CHLORIDE 105 09/26/2016 10:09 AM    CHLORIDE 105 03/10/2015 12:15 PM    CO2 30 09/26/2016 10:09 AM    CO2 28 03/10/2015 12:15 PM    ANIONGAP 6 09/26/2016 10:09 AM    ANIONGAP 9 03/10/2015 12:15 PM    BUN 8 09/26/2016 10:09 AM    BUN 10 03/10/2015 12:15 PM    CREATININE 0.96 09/26/2016 10:09 AM    CREATININE 0.98 03/10/2015 12:15 PM    Lab Results   Component Value Date/Time    CALCIUM 9.9 09/26/2016 10:09 AM    CALCIUM 9.6 03/10/2015 12:15 PM    PHOSPHORUS 2.8 09/11/2015 03:46 PM    ALBUMIN 4.3 09/26/2016 10:09 AM    ALBUMIN 4.4 03/10/2015 12:15 PM    TOTALPROTEIN 6.6 09/26/2016 10:09 AM    TOTALPROTEIN 6.6 03/10/2015 12:15 PM    ALKPHOS 67 09/26/2016 10:09 AM     ALKPHOS 63 03/10/2015 12:15 PM    AST 22 09/26/2016 10:09 AM    AST 22 03/10/2015 12:15 PM    ALT 16 09/26/2016 10:09 AM    ALT 18 03/10/2015 12:15 PM    TOTBILIRUBIN 0.3 09/26/2016 10:09 AM    TOTBILIRUBIN 0.8 03/10/2015 12:15 PM            Past Medical History  Current Outpatient Prescriptions   Medication Sig    amLODIPine (NORVASC) 10 mg Oral Tablet TAKE 1 TABLET DAILY    aspirin (ECOTRIN) 81 mg Oral Tablet, Delayed Release (E.C.) Take 81 mg by mouth Once a day    bisoprolol-hydroCHLOROthiazide (ZIAC) 10-6.25 mg Oral Tablet TAKE 1 TABLET DAILY    calcium citrate-vitamin D3 (CITRACAL) 200 mg calcium -250 unit Oral Tablet Take 1 Tab by mouth Once a day    Flaxseed Oil 1,000 mg Oral Capsule Take by mouth    fluticasone (FLONASE) 50 mcg/actuation Nasal Spray, Suspension 2 Sprays by Each Nostril route Once a day    multivitamin Oral Tablet Take 1 Tab  by mouth Once a day    timolol maleate (TIMOPTIC) 0.25 % Ophthalmic Drops Instill 1 Drop into right eye Twice daily     Allergies   Allergen Reactions    Amoxicillin Rash     Past Medical History:   Diagnosis Date    Breast mass, right     benign    Cataract     bilat    Family history of colon cancer     Heartburn     HTN (hypertension)     Nausea with vomiting     sister with PONV    Obesity     Pulmonary emboli     only once in 1999 after her hysterectomy    Wears glasses     reading         Past Surgical History:   Procedure Laterality Date    HX BREAST LUMPECTOMY Right     benign    HX CATARACT REMOVAL  10/2013    left    HX HYSTERECTOMY  1999    fibroid    HX ROTATOR CUFF REPAIR  11/04/2013    right shoulder    INJECTION,TENDON SHEATH,LIG W/ [place] (AMB ONLY)  02/10/2016         Family Medical History     Problem Relation (Age of Onset)    Breast Cancer Maternal Grandmother    Cancer Mother, Sister    Hypertension Mother    No Known Problems Brother, Maternal Grandfather, Paternal Grandmother, Paternal Grandfather, Daughter, Son, Maternal Aunt,  Maternal Uncle, Paternal 26, Paternal Uncle, Other    Stroke Mother, Father            Social History     Social History    Marital status: Divorced     Spouse name: N/A    Number of children: 0    Years of education: N/A     Occupational History     [employer]     Social History Main Topics    Smoking status: Never Smoker    Smokeless tobacco: Never Used    Alcohol use No    Drug use: No    Sexual activity: Not on file     Other Topics Concern    Abuse/Domestic Violence No    Drives Yes    Special Diet No    Uses Cane No    Uses Walker No    Uses Wheelchair No    Right Hand Dominant Yes    Left Hand Dominant No    Ability To Walk 1 Flight Of Steps Without Sob/Cp Yes    Routine Exercise Yes     Zumba class    Ability To Walk 2 Flight Of Steps Without Sob/Cp Yes    Ability To Do Own Adl's Yes     Social History Narrative         Review of Systems  Constitutional: Negative.   HENT: Negative.   Respiratory: Negative.   Cardiovascular: Negative.   Gastrointestinal: Negative.   Genitourinary: Negative.   Skin: Negative.   Neurological: Negative.   Hematological: Negative.  Objective:     BP 118/78   Pulse 60   Temp 36.4 C (97.5 F) (Tympanic)    Ht 1.651 m (5\' 5" )   Wt 66.7 kg (147 lb)   SpO2 98%   BMI 24.46 kg/m2     Physical Exam  Appearance healthy, alert and cooperative.  General exam BP noted to be well controlled today in office,  S1, S2 normal, no gallop, no murmur, chest clear, no JVD, no HSM, no edema  Neck: no carotid bruit, no thyromegaly, no thyroid mass, supple,   Abd: +BS, ND/NT, no Organomegaly, no abd bruit, no mass/hernia  Gait: WNL    Ortho Exam    Assessment & Plan:       ICD-10-CM    1. HTN (hypertension) I10 Assessment:    Hypertension well controlled, no significant medication side effects noted, needs to follow diet more regularly.     Plan:   current treatment plan is effective, no change in therapy, lab results and schedule of future lab studies reviewed with patient,  repeat labs ordered prior to next appointment, orders and follow up as documented in [facility], reviewed diet, exercise and weight control, recommended sodium restriction, cardiovascular risk and specific lipid/LDL goals reviewed, reviewed medications and side effects in detail, reviewed potential future medication changes and side effects, use of aspirin to prevent MI and TIAs discussed.  BASIC METABOLIC PANEL   2. Bone disease M89.9 DEXA BONE DENSITY - AXIAL   3. Medication management B52.080 BASIC METABOLIC PANEL   4. BMI 24.0-24.9, adult Z68.24 BMI addressed: Normal for patients height and weight       RTC in 6 months

## 2017-03-22 NOTE — Nursing Note (Signed)
Carrie Escobar is a 71 y.o. female who presents today for a follow up on blood pressure. Patient does check her blood pressure at home; it ranges in the 219 systolic over 80 diastolic range. Patient currently has the following symptoms; no symptoms. Patient does adhere to a low salt diet. Talbert Forest, MA

## 2017-04-05 ENCOUNTER — Ambulatory Visit
Admission: RE | Admit: 2017-04-05 | Discharge: 2017-04-05 | Disposition: A | Discharge status: Home / Self Care | Payer: Medicare PPO | Source: Ambulatory Visit | Attending: Geriatric Medicine | Admitting: Geriatric Medicine

## 2017-04-05 DIAGNOSIS — Z780 Asymptomatic menopausal state: Secondary | ICD-10-CM | POA: Insufficient documentation

## 2017-04-05 DIAGNOSIS — Z13820 Encounter for screening for osteoporosis: Secondary | ICD-10-CM | POA: Insufficient documentation

## 2017-04-05 DIAGNOSIS — M899 Disorder of bone, unspecified: Secondary | ICD-10-CM

## 2017-05-31 ENCOUNTER — Other Ambulatory Visit (INDEPENDENT_AMBULATORY_CARE_PROVIDER_SITE_OTHER): Payer: Self-pay | Admitting: Geriatric Medicine

## 2017-06-14 ENCOUNTER — Other Ambulatory Visit: Payer: Self-pay | Admitting: Geriatric Medicine

## 2017-08-27 ENCOUNTER — Encounter (INDEPENDENT_AMBULATORY_CARE_PROVIDER_SITE_OTHER): Payer: Self-pay | Admitting: Family Medicine

## 2017-08-27 ENCOUNTER — Ambulatory Visit (INDEPENDENT_AMBULATORY_CARE_PROVIDER_SITE_OTHER): Payer: No Typology Code available for payment source | Admitting: Family Medicine

## 2017-08-27 VITALS — BP 152/77 | HR 67 | Temp 98.1°F | Resp 15 | Ht 65.5 in | Wt 147.8 lb

## 2017-08-27 DIAGNOSIS — R21 Rash and other nonspecific skin eruption: Secondary | ICD-10-CM

## 2017-08-27 MED ORDER — TRIAMCINOLONE ACETONIDE 0.1 % EX OINT
TOPICAL_OINTMENT | Freq: Two times a day (BID) | CUTANEOUS | 0 refills | Status: AC
Start: 2017-08-27 — End: 2017-09-03

## 2017-08-27 NOTE — Patient Instructions (Signed)
Nonspecific Dermatitis  Dermatitis is a skin rash caused by something that touches the skin and makes it irritated and inflamed. Your skin may be red, swollen, dry, and may be cracked. Blisters may form and ooze. The rash will itch.  Dermatitis can form on the face and neck, backs of hands, forearms, genitals, and lower legs. Dermatitis is not passed from person to person.  Talk with your health care provider about what may have caused the rash. Common things that cause skin allergies are metal in jewelry, plants like poison ivy or poison oak, and certain skin care products. You will need to avoid the source of your rash in the future to prevent it from coming back. In some cases, the cause of the dermatitis may not be found.  Treatment is done to relieve itching and prevent the rash from coming back. The rash should go away in a few days to a few weeks.  Home care  The health care provider may prescribe medications to relieve swelling and itching. Follow all instructions when using these medications.   Avoid anything that heats up your skin, such as hot showers or baths, or direct sunlight. This can make itching worse.   Stay away from whatever you think caused the rash.   Bathe in warm, not hot, water. Apply a moisturizing lotion after bathing to prevent dry skin.   Avoid skin irritants such as wool or silk clothing, grease, oils, harsh soaps, and detergents.   Apply cold compresses to soothe your sores to help relieve your symptoms. Do this for 30 minutes 3 to 4 times a day. You can make a cold compress by soaking a cloth in cold water. Squeeze out excess water. You can add colloidal oatmeal to the water to help reduce itching. For severe itching in a small area, apply an ice pack wrapped in a thin towel. Do this for 20 minutes 3 to 4 times a day.   You can also help relieve large areas of itching by taking a lukewarm bath with colloidal oatmeal added to the water.   Use hydrocortisone cream for redness  and irritation, unless another medicine was prescribed. You can also use benzocaine anesthetic cream or spray.   Use oral diphenhydramine to help reduce itching. This is an antihistamine you can buy at drug and grocery stores. It can make you sleepy, so use lower doses during the daytime. Or you can use loratadine. This is an antihistamine that will not make you sleepy. Don't use diphenhydramine if you have glaucoma or have trouble urinating because of an enlarged prostate.   Wash your hands or use an antibacterial gel often to prevent the spread of the rash.  Follow-up care  Follow up with your health care provider. Make an appointment with your health care provider if your symptoms do not get better in the next 1 to 2 weeks.  When to seek medical advice  Call your health care provider right awayif any of these occur:   Spreading of the rash to other parts of your body   Severe swelling of your face, eyelids, mouth, throat or tongue   Trouble urinating due to swelling in the genital area   Fever of 100.4F (38C) or higher   Redness or swelling that gets worse   Pain that gets worse   Foul-smelling fluid leaking from the skin   Yellow-brown crusts on the open blisters   Joint pain  Date Last Reviewed: 05/01/2013   2000-2016 [employer] [address], [place] [address]. All rights reserved. This information is not intended as a substitute for professional medical care. Always follow your healthcare professional's instructions.

## 2017-08-27 NOTE — Progress Notes (Signed)
Subjective:    Patient ID: Amy Christensen is a 71 y.o. female.    Rash   This is a new problem. The current episode started today (1/2 AN HOUR AGO). The problem has been gradually worsening since onset. Location: FACE,NECK,UPPER EXTREMITY. The rash is characterized by itchiness. It is unknown if there was an exposure to a precipitant. Pertinent negatives include no anorexia, congestion, cough, diarrhea, eye pain, facial edema, fatigue, fever, joint pain, nail changes, rhinorrhea, shortness of breath, sore throat or vomiting. Past treatments include nothing.       The following portions of the patient's history were reviewed and updated as appropriate: allergies, current medications, past family history, past medical history, past social history, past surgical history and problem list.    Review of Systems   Constitutional: Negative for activity change, appetite change, chills, fatigue and fever.   HENT: Negative for congestion, rhinorrhea, sore throat and trouble swallowing.    Eyes: Negative for pain and visual disturbance.   Respiratory: Negative for apnea, cough, chest tightness and shortness of breath.    Cardiovascular: Negative for chest pain, palpitations and leg swelling.   Gastrointestinal: Negative for anorexia, diarrhea and vomiting.   Genitourinary: Negative for decreased urine volume and dysuria.   Musculoskeletal: Negative for back pain, joint pain and joint swelling.   Skin: Positive for rash. Negative for nail changes.   Neurological: Negative for dizziness and headaches.         Objective:    BP 152/77   Pulse 67   Temp 98.1 F (36.7 C) (Oral)   Resp 15   Ht 1.664 m (5' 5.5")   Wt 67 kg (147 lb 12.8 oz)   BMI 24.22 kg/m     Physical Exam   Constitutional: She is oriented to person, place, and time. She appears well-developed and well-nourished. No distress.   HENT:   Head: Normocephalic and atraumatic.   Right Ear: External ear normal.   Left Ear: External ear normal.   Mouth/Throat:  Oropharynx is clear and moist. No oropharyngeal exudate.   Eyes: Pupils are equal, round, and reactive to light. Conjunctivae and EOM are normal. Right eye exhibits no discharge. Left eye exhibits no discharge.   Neck: Normal range of motion. Neck supple.   Cardiovascular: Normal rate, regular rhythm and normal heart sounds.    No murmur heard.  Pulmonary/Chest: Effort normal and breath sounds normal. No respiratory distress. She has no wheezes. She has no rales.   Musculoskeletal: She exhibits no edema.   Lymphadenopathy:     She has no cervical adenopathy.   Neurological: She is alert and oriented to person, place, and time.   Skin: Skin is warm. Rash noted. Rash is maculopapular. She is not diaphoretic.        Psychiatric: She has a normal mood and affect.         Assessment and Plan:       Amy Christensen was seen today for rash.    Diagnoses and all orders for this visit:    Rash and nonspecific skin eruption  -     triamcinolone (KENALOG) 0.1 % ointment; Apply topically 2 (two) times daily.for 7 days DO NOT USE ON FACE,ADVISE TO USE THIN LAYER    COULDN'T GIVE PREDNISONE AS PT HAS H/O GLAUCOMA.    .F/U with PCP/urgent care soon if symptoms persists/gets worse  Advise to watch for worsening symptoms,with those seek medical help immediatly  F/u with PCP 4-5 DAYS  F/u PRN  BENADRYL AS NEEDED   SLIGHTLY ELEVATED BP. HAS H/O htn. PT IS AWARE.      Etheleen Sia, MD  [facility]  08/27/2017  5:26 PM

## 2017-08-30 ENCOUNTER — Telehealth (INDEPENDENT_AMBULATORY_CARE_PROVIDER_SITE_OTHER): Payer: Self-pay

## 2017-08-30 NOTE — Telephone Encounter (Signed)
Called to check on patient after recent visit. Left a message to call back if any questions or concerns.

## 2017-09-11 ENCOUNTER — Other Ambulatory Visit (INDEPENDENT_AMBULATORY_CARE_PROVIDER_SITE_OTHER): Payer: Self-pay | Admitting: Geriatric Medicine

## 2017-09-20 NOTE — Progress Notes (Signed)
Subjective:     Patient ID:  Carrie Escobar is an 71 y.o. female     Chief Complaint:    Chief Complaint   Patient presents with   . Hypertension   . Routine Medication Monitoring       The history is provided by the patient and medical records. No language interpreter was used.     Carrie Escobar is a 71 y.o female who reports today for medication follow up.  She has medical history to include hypertension.  Hypertension ROS: taking medications as instructed, no medication side effects noted, home BP monitoring in range of 017-793 systolic over [id number], no TIAs, no chest pain on exertion, no dyspnea on exertion, no swelling of ankles, no orthostatic dizziness or lightheadedness, no orthopnea or paroxysmal nocturnal dyspnea, no palpitations, no intermittent claudication symptoms.  Pt does adhere to low salt diet.  She c/o left ear throbbing, mild pain for 2 weeks after AR vs URI. No hearing loss/ URI symptoms now/ no discharge/bleeding.   She declined FLU vaccine/ shingles.   Last DEXA 03/22/2017 with noted Bone mineral density is within normal limits.  No significant change since 2015.    Basic Metabolic Profile    Lab Results   Component Value Date/Time    SODIUM 141 03/22/2017 12:09 PM    SODIUM 142 03/10/2015 12:15 PM    POTASSIUM 5.1 03/22/2017 12:09 PM    POTASSIUM 4.7 03/10/2015 12:15 PM    CHLORIDE 105 03/22/2017 12:09 PM    CHLORIDE 105 03/10/2015 12:15 PM    CO2 26 03/22/2017 12:09 PM    CO2 28 03/10/2015 12:15 PM    ANIONGAP 10 03/22/2017 12:09 PM    ANIONGAP 9 03/10/2015 12:15 PM    Lab Results   Component Value Date/Time    BUN 15 03/22/2017 12:09 PM    BUN 10 03/10/2015 12:15 PM    CREATININE 1.04 (H) 03/22/2017 12:09 PM    CREATININE 0.98 03/10/2015 12:15 PM    GLUCOSENF 99 03/22/2017 12:09 PM          Past Medical History  Current Outpatient Medications   Medication Sig   . amLODIPine (NORVASC) 10 mg Oral Tablet TAKE 1 TABLET DAILY   . aspirin (ECOTRIN) 81 mg Oral Tablet, Delayed  Release (E.C.) Take 81 mg by mouth Once a day   . bisoprolol-hydroCHLOROthiazide (ZIAC) 10-6.25 mg Oral Tablet TAKE 1 TABLET DAILY (NEED TO BE SEEN FOR FURTHER REFILLS)   . calcium citrate-vitamin D3 (CITRACAL) 200 mg calcium -250 unit Oral Tablet Take 1 Tab by mouth Once a day   . Flaxseed Oil 1,000 mg Oral Capsule Take by mouth   . fluticasone (FLONASE) 50 mcg/actuation Nasal Spray, Suspension 2 Sprays by Each Nostril route Once a day   . multivitamin Oral Tablet Take 1 Tab by mouth Once a day   . timolol maleate (TIMOPTIC) 0.25 % Ophthalmic Drops Instill 1 Drop into right eye Twice daily   . triamcinolone acetonide (ARISTOCORT A) 0.1 % Ointment APPLY TOPICALLY 2 (TWO) TIMES DAILY.FOR 7 DAYS DO NOT USE ON FACE,ADVISE TO USE THIN LAYER     Allergies   Allergen Reactions   . Amoxicillin Rash     Past Medical History:   Diagnosis Date   . Breast mass, right     benign   . Cataract     bilat   . Family history of colon cancer    . Heartburn    . HTN (hypertension)    .  Nausea with vomiting     sister with PONV   . Obesity    . Pulmonary emboli     only once in 1999 after her hysterectomy   . Wears glasses     reading         Past Surgical History:   Procedure Laterality Date   . HX BREAST LUMPECTOMY Right     benign   . HX CATARACT REMOVAL  10/2013    left   . HX HYSTERECTOMY  1999    fibroid   . HX ROTATOR CUFF REPAIR  11/04/2013    right shoulder   . INJECTION,TENDON SHEATH,LIG W/ [place] (AMB ONLY)  02/10/2016         Family Medical History:     Problem Relation (Age of Onset)    Breast Cancer Maternal Grandmother    Cancer Mother, Sister    Hypertension Mother    No Known Problems Brother, Maternal Grandfather, Paternal Grandmother, Paternal 73, Daughter, Son, Maternal 63, Maternal Uncle, Paternal 69, Paternal Uncle, Other    Stroke Mother, Father            Social History     Socioeconomic History   . Marital status: Divorced     Spouse name: Not on file   . Number of children: 0   . Years of education: Not  on file   . Highest education level: Not on file   Social Needs   . Financial resource strain: Not on file   . Food insecurity - worry: Not on file   . Food insecurity - inability: Not on file   . Transportation needs - medical: Not on file   . Transportation needs - non-medical: Not on file   Occupational History     Employer: [employer]   Tobacco Use   . Smoking status: Never Smoker   . Smokeless tobacco: Never Used   Substance and Sexual Activity   . Alcohol use: No     Alcohol/week: 0.0 oz   . Drug use: No   . Sexual activity: Not on file   Other Topics Concern   . Abuse/Domestic Violence No   . Breast Self Exam Not Asked   . Caffeine Concern Not Asked   . Calcium intake adequate Not Asked   . Computer Use Not Asked   . Drives Yes   . Exercise Concern Not Asked   . Helmet Use Not Asked   . Seat Belt Not Asked   . Special Diet No   . Sunscreen used Not Asked   . Uses Cane No   . Uses walker No   . Uses wheelchair No   . Right hand dominant Yes   . Left hand dominant No   . Ambidextrous Not Asked   . Shift Work Not Asked   . Unusual Sleep-Wake Schedule Not Asked   . Ability to Walk 1 Flight of Steps without SOB/CP Yes   . Routine Exercise Yes     Comment: Zumba class   . Ability to Walk 2 Flight of Steps without SOB/CP Yes   . Unable to Ambulate Not Asked   . Total Care Not Asked   . Ability To Do Own ADL's Yes   . Uses Walker Not Asked   . Other Activity Level Not Asked   . Uses Cane Not Asked   Social History Narrative   . Not on file         Review of Systems  Constitutional: Negative.    HENT: Positive for ear pain. Negative for congestion, dental problem, mouth sores, rhinorrhea, sinus pressure and sinus pain.    Respiratory: Negative.    Cardiovascular: Negative.    Gastrointestinal: Negative.    Genitourinary: Negative.    Skin: Negative.    Neurological: Negative.    Hematological: Negative.    Psychiatric/Behavioral: Negative.        Objective:     BP 130/64   Pulse 57   Temp 36.8 C (98.2 F) (Tympanic)    Resp 12   Ht 1.664 m (5' 5.5")   Wt 65.6 kg (144 lb 9.6 oz)   SpO2 99%   Breastfeeding? No   BMI 23.70 kg/m          Physical Exam   Constitutional: She is oriented to person, place, and time. She appears well-developed and well-nourished.   HENT:   Head: Normocephalic and atraumatic.   Right Ear: External ear normal. Tympanic membrane is bulging. Tympanic membrane is not injected and not erythematous. A middle ear effusion is present.   Left Ear: External ear normal. Tympanic membrane is bulging. Tympanic membrane is not injected and not erythematous. A middle ear effusion is present.   Mouth/Throat: No oropharyngeal exudate.   Eyes: Conjunctivae are normal.   Neck: Normal range of motion. Neck supple. Carotid bruit is not present. No thyroid mass and no thyromegaly present.   Cardiovascular: Normal rate and regular rhythm.   Murmur heard.   Systolic murmur is present.  Pulmonary/Chest: Effort normal and breath sounds normal.   Abdominal: Soft. Bowel sounds are normal. She exhibits no distension and no mass. There is no tenderness. There is no rebound and no guarding.   Musculoskeletal: She exhibits no edema.   Lymphadenopathy:     She has no cervical adenopathy.   Neurological: She is alert and oriented to person, place, and time. Coordination normal.   Skin: No rash noted.   Nursing note and vitals reviewed.      Ortho Exam    Assessment & Plan:       ICD-10-CM    1. Essential hypertension I10 COMPREHENSIVE METABOLIC PROFILE - [facility] ONLY     TRANSTHORACIC ECHOCARDIOGRAM - ADULT   2. Dysfunction of left eustachian tube H69.82 Flonase BID   3. Heart murmur R01.1 TRANSTHORACIC ECHOCARDIOGRAM - ADULT   4. Medication management Z79.899 COMPREHENSIVE METABOLIC PROFILE - [facility] ONLY   5. Breast cancer screening Z12.31 MAMMO SCREENING BILATERAL W/ TOMO   6. Screening, lipid Z13.220 LIPID PANEL   7. Nasal congestion R09.81 fluticasone (FLONASE) 50 mcg/actuation Nasal Spray, Suspension   8. BMI 23.0-23.9, adult  Z68.23      Hypertension well controlled, no significant medication side effects noted, needs to follow diet more regularly.     Plan:   current treatment plan is effective, no change in therapy, lab results and schedule of future lab studies reviewed with patient, repeat labs ordered prior to next appointment, orders and follow up as documented in [facility], reviewed diet, exercise and weight control, recommended sodium restriction, cardiovascular risk and specific lipid/LDL goals reviewed, reviewed medications and side effects in detail, reviewed potential future medication changes and side effects, use of aspirin to prevent MI and TIAs discussed.    BMI addressed: WNL, healthy diet  Drink plenty of water,take Tylenol and /or Ibuprofen as needed for pain and fever, nose irrigation with saline (mild salt water), gargle with warm salt water 2-3 times a day.  RTC in  6 months

## 2017-09-21 ENCOUNTER — Other Ambulatory Visit (HOSPITAL_BASED_OUTPATIENT_CLINIC_OR_DEPARTMENT_OTHER): Payer: Medicare PPO | Attending: Geriatric Medicine

## 2017-09-21 ENCOUNTER — Encounter (INDEPENDENT_AMBULATORY_CARE_PROVIDER_SITE_OTHER): Payer: Self-pay | Admitting: Geriatric Medicine

## 2017-09-21 ENCOUNTER — Ambulatory Visit (INDEPENDENT_AMBULATORY_CARE_PROVIDER_SITE_OTHER): Payer: Medicare PPO | Admitting: Geriatric Medicine

## 2017-09-21 VITALS — BP 130/64 | HR 57 | Temp 98.2°F | Resp 12 | Ht 65.5 in | Wt 144.6 lb

## 2017-09-21 DIAGNOSIS — Z1239 Encounter for other screening for malignant neoplasm of breast: Secondary | ICD-10-CM

## 2017-09-21 DIAGNOSIS — Z79899 Other long term (current) drug therapy: Secondary | ICD-10-CM | POA: Insufficient documentation

## 2017-09-21 DIAGNOSIS — Z6823 Body mass index (BMI) 23.0-23.9, adult: Secondary | ICD-10-CM

## 2017-09-21 DIAGNOSIS — R011 Cardiac murmur, unspecified: Secondary | ICD-10-CM

## 2017-09-21 DIAGNOSIS — Z1231 Encounter for screening mammogram for malignant neoplasm of breast: Secondary | ICD-10-CM

## 2017-09-21 DIAGNOSIS — Z13220 Encounter for screening for lipoid disorders: Secondary | ICD-10-CM | POA: Insufficient documentation

## 2017-09-21 DIAGNOSIS — R0981 Nasal congestion: Secondary | ICD-10-CM

## 2017-09-21 DIAGNOSIS — H6982 Other specified disorders of Eustachian tube, left ear: Secondary | ICD-10-CM

## 2017-09-21 DIAGNOSIS — I10 Essential (primary) hypertension: Secondary | ICD-10-CM

## 2017-09-21 LAB — LIPID PANEL
CHOL/HDL RATIO: 2.4
CHOLESTEROL: 211 mg/dL — ABNORMAL HIGH (ref 120–199)
HDL CHOL: 88 mg/dL (ref >39)
LDL CALC: 113 mg/dL (ref <=130)
TRIGLYCERIDES: 48 mg/dL (ref 35–135)
VLDL CALC: 10 mg/dL (ref 5–35)

## 2017-09-21 LAB — COMPREHENSIVE METABOLIC PROFILE - BMC/JMC ONLY
ALBUMIN/GLOBULIN RATIO: 1.9 (ref 0.8–2.0)
ALBUMIN: 4.4 g/dL (ref 3.5–5.0)
ALKALINE PHOSPHATASE: 74 U/L (ref 38–126)
ALT (SGPT): 16 U/L (ref 14–54)
ANION GAP: 6 mmol/L (ref 3–11)
AST (SGOT): 24 U/L (ref 15–41)
BILIRUBIN TOTAL: 0.8 mg/dL (ref 0.3–1.2)
BUN/CREA RATIO: 10 (ref 6–22)
BUN: 10 mg/dL (ref 6–20)
CALCIUM: 10.1 mg/dL (ref 8.8–10.2)
CHLORIDE: 106 mmol/L (ref 101–111)
CO2 TOTAL: 28 mmol/L (ref 22–32)
CREATININE: 1.01 mg/dL — ABNORMAL HIGH (ref 0.44–1.00)
ESTIMATED GFR: >60 mL/min/1.73mˆ2 (ref >60)
GLUCOSE: 114 mg/dL — ABNORMAL HIGH (ref 70–110)
POTASSIUM: 5.6 mmol/L — ABNORMAL HIGH (ref 3.4–5.1)
PROTEIN TOTAL: 6.7 g/dL (ref 6.4–8.3)
SODIUM: 140 mmol/L (ref 136–145)

## 2017-09-21 MED ORDER — FLUTICASONE PROPIONATE 50 MCG/ACTUATION NASAL SPRAY,SUSPENSION
1.00 | Freq: Two times a day (BID) | NASAL | 0 refills | Status: DC | PRN
Start: 2017-09-21 — End: 2020-07-09

## 2017-09-21 NOTE — Nursing Note (Signed)
Carrie Escobar is a 71 y.o. female who presents today for a follow up on blood pressure. Patient does check her blood pressure at home; it ranges in the 837-290 systolic over 21-11 diastolic range. Patient currently has the following symptoms; left ear pulsations. Patient does try to adhere to a low salt diet. Karie Kirks, [place]

## 2017-10-04 ENCOUNTER — Ambulatory Visit (HOSPITAL_BASED_OUTPATIENT_CLINIC_OR_DEPARTMENT_OTHER)
Admission: RE | Admit: 2017-10-04 | Discharge: 2017-10-04 | Disposition: A | Discharge status: Home / Self Care | Payer: Medicare PPO | Source: Ambulatory Visit

## 2017-10-04 ENCOUNTER — Ambulatory Visit (HOSPITAL_BASED_OUTPATIENT_CLINIC_OR_DEPARTMENT_OTHER)
Admission: RE | Admit: 2017-10-04 | Discharge: 2017-10-04 | Disposition: A | Discharge status: Home / Self Care | Payer: Medicare PPO | Source: Ambulatory Visit | Attending: Geriatric Medicine | Admitting: Geriatric Medicine

## 2017-10-04 ENCOUNTER — Encounter

## 2017-10-04 DIAGNOSIS — Z1231 Encounter for screening mammogram for malignant neoplasm of breast: Secondary | ICD-10-CM | POA: Insufficient documentation

## 2017-10-04 DIAGNOSIS — I10 Essential (primary) hypertension: Secondary | ICD-10-CM | POA: Insufficient documentation

## 2017-10-04 DIAGNOSIS — I340 Nonrheumatic mitral (valve) insufficiency: Secondary | ICD-10-CM | POA: Insufficient documentation

## 2017-10-04 DIAGNOSIS — I361 Nonrheumatic tricuspid (valve) insufficiency: Secondary | ICD-10-CM

## 2017-10-04 DIAGNOSIS — R921 Mammographic calcification found on diagnostic imaging of breast: Secondary | ICD-10-CM | POA: Insufficient documentation

## 2017-10-04 DIAGNOSIS — R011 Cardiac murmur, unspecified: Secondary | ICD-10-CM

## 2017-10-04 DIAGNOSIS — Z1239 Encounter for other screening for malignant neoplasm of breast: Secondary | ICD-10-CM

## 2017-10-04 DIAGNOSIS — I371 Nonrheumatic pulmonary valve insufficiency: Secondary | ICD-10-CM

## 2017-10-04 LAB — TRANSTHORACIC ECHOCARDIOGRAM - ADULT
Biplane Simpson EF: 65
Interventricular Septum Diastolic Thickness by 2D: 0.8 cm
LA Volume Index: 24
LVIDD - 2D: 4.9 cm
LVIDS 2D: 3.2 cm
LVPWD: 0.8 cm
MV E/A: 1
TR VELOCITY: 188

## 2017-10-19 ENCOUNTER — Other Ambulatory Visit: Payer: Self-pay | Admitting: Geriatric Medicine

## 2017-10-19 MED ORDER — AMLODIPINE 10 MG TABLET
ORAL_TABLET | ORAL | 1 refills | Status: DC
Start: 2017-10-19 — End: 2018-04-17

## 2017-10-19 NOTE — Telephone Encounter (Signed)
Please review and sign. Thank you. Dencil Cayson, MA

## 2017-10-20 ENCOUNTER — Encounter: Payer: Self-pay | Admitting: Geriatric Medicine

## 2017-10-25 ENCOUNTER — Telehealth: Payer: Self-pay | Admitting: Geriatric Medicine

## 2017-10-25 NOTE — Telephone Encounter (Signed)
Pt came to office per letter received about echo and mammogram. She is aware of results.  Mariel Aloe, MA

## 2017-12-25 ENCOUNTER — Other Ambulatory Visit: Payer: Self-pay | Admitting: Geriatric Medicine

## 2017-12-25 MED ORDER — BISOPROLOL 10 MG-HYDROCHLOROTHIAZIDE 6.25 MG TABLET
ORAL_TABLET | ORAL | 1 refills | Status: DC
Start: 2017-12-25 — End: 2018-06-25

## 2017-12-25 NOTE — Telephone Encounter (Signed)
Please review and sign. Thank you. Jerardo Costabile, MA

## 2018-03-16 ENCOUNTER — Encounter (INDEPENDENT_AMBULATORY_CARE_PROVIDER_SITE_OTHER): Payer: Self-pay | Admitting: Geriatric Medicine

## 2018-03-16 ENCOUNTER — Ambulatory Visit (INDEPENDENT_AMBULATORY_CARE_PROVIDER_SITE_OTHER): Payer: Medicare PPO | Admitting: Geriatric Medicine

## 2018-03-16 VITALS — BP 138/80 | HR 62 | Temp 96.9°F | Resp 12 | Ht 65.5 in | Wt 157.0 lb

## 2018-03-16 DIAGNOSIS — I10 Essential (primary) hypertension: Secondary | ICD-10-CM

## 2018-03-16 DIAGNOSIS — Z9181 History of falling: Secondary | ICD-10-CM

## 2018-03-16 DIAGNOSIS — E785 Hyperlipidemia, unspecified: Secondary | ICD-10-CM

## 2018-03-16 DIAGNOSIS — Z79899 Other long term (current) drug therapy: Secondary | ICD-10-CM

## 2018-03-16 DIAGNOSIS — Z6825 Body mass index (BMI) 25.0-25.9, adult: Secondary | ICD-10-CM

## 2018-03-16 MED ORDER — ATORVASTATIN 20 MG TABLET
20.0000 mg | ORAL_TABLET | Freq: Every day | ORAL | 1 refills | Status: DC
Start: 2018-03-16 — End: 2020-03-03

## 2018-03-16 NOTE — Progress Notes (Signed)
Subjective:     Patient ID:  Carrie Escobar is an 72 y.o. female     Chief Complaint:    Chief Complaint   Patient presents with   . Hypertension   . Routine Medication Monitoring     HPI:  The history is provided by the patient and medical records. No language interpreter was used.     Carrie Escobar is a 72 y.o female who reports today for medication follow up.  She has medical history to include hypertension.  Hypertension [id number] medications as instructed, no medication side effects noted, home BP monitoring in range [id number] systolic [id number], no TIAs, no chest pain on exertion, no dyspnea on exertion, no swelling of ankles, no orthostatic dizziness or lightheadedness, no orthopnea or paroxysmal nocturnal dyspnea, no palpitations, no intermittent claudication symptoms.  Pt does adhere to low salt diet.     Last DEXA 03/22/2017 with noted Bone mineral density is within normal limits. No significant change since 2015.  She had echo on 09/2017 for heart murmur: acceptable LVF, + for mitral insufficiency.   Mammogram 09/2017: WNL  Fall Risk Assessment   Do you feel unsteady when standing or walking? No   Do you worry about falling? No   Have you fallen in the past year? No       Comprehensive Metabolic Profile    SODIUM   Date Value Ref Range Status   09/21/2017 140 136 - 145 mmol/L Final     POTASSIUM   Date Value Ref Range Status   09/21/2017 5.6 (H) 3.4 - 5.1 mmol/L Final     CHLORIDE   Date Value Ref Range Status   09/21/2017 106 101 - 111 mmol/L Final     CO2 TOTAL   Date Value Ref Range Status   09/21/2017 28 22 - 32 mmol/L Final     ANION GAP   Date Value Ref Range Status   09/21/2017 6 3 - 11 mmol/L Final     BUN   Date Value Ref Range Status   09/21/2017 10 6 - 20 mg/dL Final     CREATININE   Date Value Ref Range Status   09/21/2017 1.01 (H) 0.44 - 1.00 mg/dL Final    CALCIUM   Date Value Ref Range Status   09/21/2017 10.1 8.8 - 10.2 mg/dL Final     PHOSPHORUS   Date Value Ref  Range Status   09/11/2015 2.8 2.7 - 4.5 mg/dL Final     ALBUMIN   Date Value Ref Range Status   09/21/2017 4.4 3.5 - 5.0 g/dL Final     PROTEIN TOTAL   Date Value Ref Range Status   09/21/2017 6.7 6.4 - 8.3 g/dL Final     ALKALINE PHOSPHATASE   Date Value Ref Range Status   09/21/2017 74 38 - 126 U/L Final     AST (SGOT)   Date Value Ref Range Status   09/21/2017 24 15 - 41 U/L Final     ALT (SGPT)   Date Value Ref Range Status   09/21/2017 16 14 - 54 U/L Final     BILIRUBIN TOTAL   Date Value Ref Range Status   09/21/2017 0.8 0.3 - 1.2 mg/dL Final        Lab Results   Component Value Date    CHOLESTEROL 211 (H) 09/21/2017    CHOLESTEROL 196 03/10/2015    HDLCHOL 88 09/21/2017    HDLCHOL 66 03/10/2015    LDLCHOL 113 09/21/2017  LDLCHOL 110 03/10/2015    TRIG 48 09/21/2017    TRIG 101 (U) 03/10/2015              Past Medical History  Current Outpatient Medications   Medication Sig   . amLODIPine (NORVASC) 10 mg Oral Tablet TAKE 1 TABLET DAILY   . aspirin (ECOTRIN) 81 mg Oral Tablet, Delayed Release (E.C.) Take 81 mg by mouth Once a day   . bisoprolol-hydroCHLOROthiazide (ZIAC) 10-6.25 mg Oral Tablet TAKE 1 TABLET DAILY   . calcium citrate-vitamin D3 (CITRACAL) 200 mg calcium -250 unit Oral Tablet Take 1 Tab by mouth Once a day   . Flaxseed Oil 1,000 mg Oral Capsule Take by mouth   . fluticasone (FLONASE) 50 mcg/actuation Nasal Spray, Suspension 1 Spray by Each Nostril route Twice per day as needed (Patient not taking: Reported on 03/16/2018)   . multivitamin Oral Tablet Take 1 Tab by mouth Once a day   . timolol maleate (TIMOPTIC) 0.25 % Ophthalmic Drops Instill 1 Drop into right eye Twice daily   . triamcinolone acetonide (ARISTOCORT A) 0.1 % Ointment APPLY TOPICALLY 2 (TWO) TIMES DAILY.FOR 7 DAYS DO NOT USE ON FACE,ADVISE TO USE THIN LAYER     Allergies   Allergen Reactions   . Amoxicillin Rash     Past Medical History:   Diagnosis Date   . Breast mass, right     benign   . Cataract     bilat   . Family history of  colon cancer    . Heartburn    . HTN (hypertension)    . Nausea with vomiting     sister with PONV   . Obesity    . Pulmonary emboli     only once in 1999 after her hysterectomy   . Wears glasses     reading         Past Surgical History:   Procedure Laterality Date   . HX BREAST LUMPECTOMY Right     benign   . HX CATARACT REMOVAL  10/2013    left   . HX HYSTERECTOMY  1999    fibroid   . HX ROTATOR CUFF REPAIR  11/04/2013    right shoulder   . INJECTION,TENDON SHEATH,LIG W/ [place] (AMB ONLY)  02/10/2016         Family Medical History:     Problem Relation (Age of Onset)    Breast Cancer Maternal Grandmother    Cancer Mother, Sister    Hypertension (High Blood Pressure) Mother    No Known Problems Brother, Maternal Grandfather, Paternal Grandmother, Paternal 97, Daughter, Son, Maternal 44, Maternal Uncle, Paternal 39, Paternal Uncle, Other    Stroke Mother, Father            Social History     Socioeconomic History   . Marital status: Divorced     Spouse name: Not on file   . Number of children: 0   . Years of education: Not on file   . Highest education level: Not on file   Occupational History     Employer: [employer]   Tobacco Use   . Smoking status: Never Smoker   . Smokeless tobacco: Never Used   Substance and Sexual Activity   . Alcohol use: No     Alcohol/week: 0.0 oz   . Drug use: No   Other Topics Concern   . Abuse/Domestic Violence No   . Drives Yes   . Special Diet No   .  Uses Cane No   . Uses walker No   . Uses wheelchair No   . Right hand dominant Yes   . Left hand dominant No   . Ability to Walk 1 Flight of Steps without SOB/CP Yes   . Routine Exercise Yes     Comment: Zumba class   . Ability to Walk 2 Flight of Steps without SOB/CP Yes   . Ability To Do Own ADL's Yes         Review of Systems   Constitutional: Negative.    HENT: Negative.    Eyes: Negative.    Respiratory: Negative.    Cardiovascular: Negative.    Gastrointestinal: Negative.    Skin: Negative.    Neurological: Negative.       Psychiatric/Behavioral: Negative.      Objective:     BP 138/80   Pulse 62   Temp 36.1 C (96.9 F) (Tympanic)   Resp 12   Ht 1.664 m (5' 5.5")   Wt 71.2 kg (157 lb)   SpO2 99%   Breastfeeding? No   BMI 25.73 kg/m          Physical Exam   Constitutional: She is oriented to person, place, and time. She appears well-developed and well-nourished.   HENT:   Head: Normocephalic and atraumatic.   Right Ear: External ear normal.   Left Ear: External ear normal.   Eyes: Conjunctivae are normal. No scleral icterus.   Neck: Normal range of motion. Neck supple. Carotid bruit is not present. No thyroid mass and no thyromegaly present.   Cardiovascular: Normal rate, regular rhythm and normal pulses. Exam reveals no gallop.   Murmur heard.   Systolic murmur is present.  Pulmonary/Chest: Effort normal and breath sounds normal. She has no wheezes. She has no rales. She exhibits no tenderness.   Abdominal: Soft. Bowel sounds are normal. There is no tenderness.   Musculoskeletal: Normal range of motion.   Lymphadenopathy:     She has no cervical adenopathy.   Neurological: She is alert and oriented to person, place, and time. She has normal strength. Coordination and gait normal. GCS eye subscore is 4. GCS verbal subscore is 5. GCS motor subscore is 6.   Skin: Skin is warm and dry. Capillary refill takes less than 2 seconds. No cyanosis. Nails show no clubbing.   Psychiatric: She has a normal mood and affect. Her behavior is normal.   Nursing note and vitals reviewed.        Ortho Exam    Assessment & Plan:       ICD-10-CM    1. Hypertension, unspecified type I10 COMPREHENSIVE METABOLIC PANEL, NON-FASTING   2. Hyperlipidemia, unspecified hyperlipidemia type E78.5 atorvastatin (LIPITOR) 20 mg Oral Tablet     COMPREHENSIVE METABOLIC PANEL, NON-FASTING   3. At low risk for fall Z91.81    4. BMI 25.0-25.9,adult Z68.25    5. Medication management Z79.899 COMPREHENSIVE METABOLIC PANEL, NON-FASTING     Hypertension well  controlled, no significant medication side effects noted, needs to follow diet more regularly.     Plan:   current treatment plan is effective, no change in therapy, lab results and schedule of future lab studies reviewed with patient, repeat labs ordered prior to next appointment, orders and follow up as documented in [facility], reviewed diet, exercise and weight control, recommended sodium restriction, cardiovascular risk and specific lipid/LDL goals reviewed, reviewed medications and side effects in detail, reviewed potential future medication changes and side effects.  CAD risk for next 10 years calculated as 11.5%---> will start Lipitor 20 mg , check LFTs in one month  She declined all vaccines.   BMI addressed: WNL, healthy diet  Fall Risk Follow up plan of care: Manage & monitor hypotension  Medications managed to minimize fall risk  RTC in 6 months

## 2018-03-16 NOTE — Nursing Note (Signed)
Carrie Escobar is a 72 y.o. female who presents today for a follow up on blood pressure. Patient does check her blood pressure at home; it ranges in the 030-092 systolic over 33-00 diastolic range. Patient currently has the following symptoms; no symptoms. Patient does adhere to a low salt diet. Karie Kirks, [place]

## 2018-04-17 ENCOUNTER — Other Ambulatory Visit: Payer: Self-pay | Admitting: Geriatric Medicine

## 2018-05-03 ENCOUNTER — Ambulatory Visit (INDEPENDENT_AMBULATORY_CARE_PROVIDER_SITE_OTHER): Payer: Medicare PPO | Admitting: Geriatric Medicine

## 2018-05-03 ENCOUNTER — Encounter (INDEPENDENT_AMBULATORY_CARE_PROVIDER_SITE_OTHER): Payer: Self-pay | Admitting: Geriatric Medicine

## 2018-05-03 VITALS — BP 118/80 | HR 49 | Temp 97.5°F | Resp 12 | Ht 65.5 in | Wt 158.0 lb

## 2018-05-03 DIAGNOSIS — M25521 Pain in right elbow: Secondary | ICD-10-CM

## 2018-05-03 DIAGNOSIS — Z6825 Body mass index (BMI) 25.0-25.9, adult: Secondary | ICD-10-CM

## 2018-05-03 DIAGNOSIS — M25562 Pain in left knee: Secondary | ICD-10-CM

## 2018-05-03 DIAGNOSIS — Z9181 History of falling: Secondary | ICD-10-CM

## 2018-05-03 NOTE — Nursing Note (Signed)
Carrie Escobar is a 72 y.o. female who reports today with the c/o fall. Patient reports that she had fallen on the date of 04/26/2018 while walking up onto a curb and landed flat on her stomach. Patient denies loss of consciousness due to fall. After fall, the patient reports upper extremity injury and lower extremity injury. The patients level of injury is low. Patient did not follow up with the ER or urgent care after fall. Patient is not at high risk for falls; Patient has no fall/safety risk identified. She has two small injuries on her left knee and right elbow. Karie Kirks, [place]

## 2018-05-03 NOTE — Progress Notes (Signed)
Subjective:     Patient ID:  Carrie Escobar is an 72 y.o. female     Chief Complaint:    Chief Complaint   Patient presents with   . Fall   Carrie Escobar is a 72 y.o. female who reports today with the c/o fall. Patient reports that she had fallen on the date of 04/26/2018 while walking up onto a curb and landed flat on her stomach. Patient denies loss of consciousness due to fall. After fall, the patient reports upper extremity injury and lower extremity injury. The patients level of injury is low. Patient did not follow up with the ER or urgent care after fall. Patient is not at high risk for falls; Patient has no fall/safety risk identified. She has two small injuries on her left knee and right elbow. Pain is 1-2/10 at rest, left knee TTP 2-3/10 upon palpation.     The history is provided by the patient and medical records. No language interpreter was used.       Past Medical History  Current Outpatient Medications   Medication Sig   . amLODIPine (NORVASC) 10 mg Oral Tablet TAKE 1 TABLET DAILY   . aspirin (ECOTRIN) 81 mg Oral Tablet, Delayed Release (E.C.) Take 81 mg by mouth Once a day   . atorvastatin (LIPITOR) 20 mg Oral Tablet Take 1 Tab (20 mg total) by mouth Once a day   . bisoprolol-hydroCHLOROthiazide (ZIAC) 10-6.25 mg Oral Tablet TAKE 1 TABLET DAILY   . calcium citrate-vitamin D3 (CITRACAL) 200 mg calcium -250 unit Oral Tablet Take 1 Tab by mouth Once a day   . Flaxseed Oil 1,000 mg Oral Capsule Take by mouth   . fluticasone (FLONASE) 50 mcg/actuation Nasal Spray, Suspension 1 Spray by Each Nostril route Twice per day as needed (Patient not taking: Reported on 03/16/2018)   . multivitamin Oral Tablet Take 1 Tab by mouth Once a day   . timolol maleate (TIMOPTIC) 0.25 % Ophthalmic Drops Instill 1 Drop into right eye Twice daily     Allergies   Allergen Reactions   . Amoxicillin Rash     Past Medical History:   Diagnosis Date   . Breast mass, right     benign   . Cataract     bilat   . Family  history of colon cancer    . Heartburn    . HTN (hypertension)    . Nausea with vomiting     sister with PONV   . Obesity    . Pulmonary emboli     only once in 1999 after her hysterectomy   . Wears glasses     reading         Past Surgical History:   Procedure Laterality Date   . HX BREAST LUMPECTOMY Right     benign   . HX CATARACT REMOVAL  10/2013    left   . HX HYSTERECTOMY  1999    fibroid   . HX ROTATOR CUFF REPAIR  11/04/2013    right shoulder   . INJECTION,TENDON SHEATH,LIG W/ [place] (AMB ONLY)  02/10/2016         Family Medical History:     Problem Relation (Age of Onset)    Breast Cancer Maternal Grandmother    Cancer Mother, Sister    Hypertension (High Blood Pressure) Mother    No Known Problems Brother, Maternal Grandfather, Paternal Grandmother, Paternal Grandfather, Daughter, Son, Maternal Aunt, Maternal Uncle, Paternal 30, Paternal Uncle, Other  Stroke Mother, Father            Social History     Socioeconomic History   . Marital status: Divorced     Spouse name: Not on file   . Number of children: 0   . Years of education: Not on file   . Highest education level: Not on file   Occupational History     Employer: [employer]   Tobacco Use   . Smoking status: Never Smoker   . Smokeless tobacco: Never Used   Substance and Sexual Activity   . Alcohol use: No     Alcohol/week: 0.0 oz   . Drug use: No   Other Topics Concern   . Abuse/Domestic Violence No   . Drives Yes   . Special Diet No   . Uses Cane No   . Uses walker No   . Uses wheelchair No   . Right hand dominant Yes   . Left hand dominant No   . Ability to Walk 1 Flight of Steps without SOB/CP Yes   . Routine Exercise Yes     Comment: Zumba class   . Ability to Walk 2 Flight of Steps without SOB/CP Yes   . Ability To Do Own ADL's Yes         Review of Systems   Constitutional: Negative.    HENT: Negative.    Respiratory: Negative.    Musculoskeletal: Positive for arthralgias and myalgias.   Skin: Positive for rash.   Hematological: Negative.               Objective:     BP 118/80   Pulse 49   Temp 36.4 C (97.5 F) (Tympanic)   Resp 12   Ht 1.664 m (5' 5.5")   Wt 71.7 kg (158 lb)   SpO2 99%   Breastfeeding? No   BMI 25.89 kg/m          Physical Exam   Constitutional: She appears well-developed and well-nourished.   HENT:   Head: Normocephalic and atraumatic.   Right Ear: External ear normal.   Left Ear: External ear normal.   Eyes: Conjunctivae are normal. No scleral icterus.   Pulmonary/Chest: Effort normal.   Musculoskeletal:        Right elbow: She exhibits normal range of motion, no swelling, no effusion, no deformity and no laceration. Tenderness (mild) found. No radial head, no medial epicondyle and no lateral epicondyle tenderness noted.        Left knee: She exhibits normal range of motion, no swelling, no effusion, no ecchymosis, no deformity, no laceration, no erythema, normal alignment, no LCL laxity, normal patellar mobility, no bony tenderness, normal meniscus and no MCL laxity. Tenderness found. Patellar tendon tenderness noted.   Neurological: She displays a negative Romberg sign. Coordination and gait normal.   Skin: No cyanosis. Nails show no clubbing.        Nursing note and vitals reviewed.        Left Knee Exam     Other   Effusion: no effusion present            Assessment & Plan:       ICD-10-CM    1. Right elbow pain M25.521    2. Acute pain of left knee M25.562    3. BMI 25.0-25.9,adult Z68.25    4. At standard risk for fall Z91.81      Knee and elbow contusion with mild TTP and no decreased ROM.  OTC pain meds, OK to resume physical activities as tolerated.   BMI addressed: healthy diet  Fall Risk Follow up plan of care: Manage & monitor hypotension  Medications managed to minimize fall risk

## 2018-06-25 ENCOUNTER — Other Ambulatory Visit (INDEPENDENT_AMBULATORY_CARE_PROVIDER_SITE_OTHER): Payer: Self-pay | Admitting: Geriatric Medicine

## 2018-06-25 MED ORDER — BISOPROLOL 10 MG-HYDROCHLOROTHIAZIDE 6.25 MG TABLET
ORAL_TABLET | ORAL | 1 refills | Status: DC
Start: 2018-06-25 — End: 2019-01-06

## 2018-06-25 NOTE — Telephone Encounter (Signed)
Please review and sign.  Pt UTD on visits.  Corabelle Spackman [place], MA

## 2018-09-12 ENCOUNTER — Ambulatory Visit (HOSPITAL_BASED_OUTPATIENT_CLINIC_OR_DEPARTMENT_OTHER): Payer: Medicare PPO | Attending: Geriatric Medicine

## 2018-09-12 ENCOUNTER — Ambulatory Visit (INDEPENDENT_AMBULATORY_CARE_PROVIDER_SITE_OTHER): Payer: Medicare PPO | Admitting: Geriatric Medicine

## 2018-09-12 ENCOUNTER — Encounter (INDEPENDENT_AMBULATORY_CARE_PROVIDER_SITE_OTHER): Payer: Self-pay | Admitting: Geriatric Medicine

## 2018-09-12 VITALS — BP 132/72 | HR 54 | Temp 96.9°F | Resp 12 | Ht 65.0 in | Wt 163.0 lb

## 2018-09-12 DIAGNOSIS — Z79899 Other long term (current) drug therapy: Secondary | ICD-10-CM

## 2018-09-12 DIAGNOSIS — E785 Hyperlipidemia, unspecified: Secondary | ICD-10-CM

## 2018-09-12 DIAGNOSIS — Z1239 Encounter for other screening for malignant neoplasm of breast: Secondary | ICD-10-CM

## 2018-09-12 DIAGNOSIS — Z6827 Body mass index (BMI) 27.0-27.9, adult: Secondary | ICD-10-CM

## 2018-09-12 DIAGNOSIS — I10 Essential (primary) hypertension: Secondary | ICD-10-CM

## 2018-09-12 DIAGNOSIS — R7301 Impaired fasting glucose: Secondary | ICD-10-CM | POA: Insufficient documentation

## 2018-09-12 DIAGNOSIS — H6981 Other specified disorders of Eustachian tube, right ear: Secondary | ICD-10-CM

## 2018-09-12 LAB — LIPID PANEL
CHOL/HDL RATIO: 3.2
CHOLESTEROL: 212 mg/dL — ABNORMAL HIGH (ref 120–199)
HDL CHOL: 66 mg/dL (ref >39)
LDL CALC: 133 mg/dL — ABNORMAL HIGH (ref <=130)
TRIGLYCERIDES: 67 mg/dL (ref <150)
VLDL CALC: 13 mg/dL (ref 5–35)

## 2018-09-12 LAB — COMPREHENSIVE METABOLIC PROFILE - BMC/JMC ONLY
ALBUMIN/GLOBULIN RATIO: 1.7 (ref 0.8–2.0)
ALBUMIN: 4.3 g/dL (ref 3.5–5.0)
ALKALINE PHOSPHATASE: 71 U/L (ref 38–126)
ALT (SGPT): 18 U/L (ref 14–54)
ANION GAP: 9 mmol/L (ref 3–11)
AST (SGOT): 23 U/L (ref 15–41)
BILIRUBIN TOTAL: 0.8 mg/dL (ref 0.3–1.2)
BUN/CREA RATIO: 10 (ref 6–22)
BUN: 9 mg/dL (ref 6–20)
CALCIUM: 9.6 mg/dL (ref 8.8–10.2)
CHLORIDE: 101 mmol/L (ref 101–111)
CHLORIDE: 101 mmol/L (ref 101–111)
CO2 TOTAL: 27 mmol/L (ref 22–32)
CREATININE: 0.94 mg/dL (ref 0.44–1.00)
ESTIMATED GFR: >60 mL/min/1.73mˆ2 (ref >60)
GLUCOSE: 104 mg/dL (ref 70–110)
POTASSIUM: 4.1 mmol/L (ref 3.4–5.1)
PROTEIN TOTAL: 6.8 g/dL (ref 6.4–8.3)
SODIUM: 137 mmol/L (ref 136–145)

## 2018-09-12 LAB — HGA1C (HEMOGLOBIN A1C WITH EST AVG GLUCOSE)
ESTIMATED AVERAGE GLUCOSE: 131 mg/dL
HEMOGLOBIN A1C: 6.2 % — ABNORMAL HIGH (ref 4.0–5.6)

## 2018-09-12 NOTE — Progress Notes (Signed)
Subjective:     Patient ID:  Carrie Escobar is an 72 y.o. female     Chief Complaint:    Chief Complaint   Patient presents with   . Hypertension   . Hyperlipidemia       HPI  Carrie Escobar is a 72 y.o female who reports today for medication follow up. She has medical history to include hypertension and hyperlipidemia. ROS:not taking medications as instructed, no medication side effects noted, home BP monitoring in range of120-130systolic [id number] diastolic, no TIAs, no chest pain on exertion, no dyspnea on exertion, no swelling of ankles, no orthostatic dizziness or lightheadedness, no orthopnea or paroxysmal nocturnal dyspnea, no palpitations, no intermittent claudication symptoms.Pt does adhere to low salt diet.    She mentions she has Lipitor 20 mg at home which was prescribed 6 months ago due to CAD risk of 11.5%.  She has not yet started this medication.    She had echocardiogram for heart murmur 09/2017: acceptable LVF, + for mitral insufficiency.   Comprehensive Metabolic Profile    SODIUM   Date Value Ref Range Status   09/12/2018 137 136 - 145 mmol/L Final     POTASSIUM   Date Value Ref Range Status   09/12/2018 4.1 3.4 - 5.1 mmol/L Final     CHLORIDE   Date Value Ref Range Status   09/12/2018 101 101 - 111 mmol/L Final     CO2 TOTAL   Date Value Ref Range Status   09/12/2018 27 22 - 32 mmol/L Final     ANION GAP   Date Value Ref Range Status   09/12/2018 9 3 - 11 mmol/L Final     BUN   Date Value Ref Range Status   09/12/2018 9 6 - 20 mg/dL Final     CREATININE   Date Value Ref Range Status   09/12/2018 0.94 0.44 - 1.00 mg/dL Final    CALCIUM   Date Value Ref Range Status   09/12/2018 9.6 8.8 - 10.2 mg/dL Final     PHOSPHORUS   Date Value Ref Range Status   09/11/2015 2.8 2.7 - 4.5 mg/dL Final     ALBUMIN   Date Value Ref Range Status   09/12/2018 4.3 3.5 - 5.0 g/dL Final     PROTEIN TOTAL   Date Value Ref Range Status   09/12/2018 6.8 6.4 - 8.3 g/dL Final     ALKALINE PHOSPHATASE   Date  Value Ref Range Status   09/12/2018 71 38 - 126 U/L Final     Comment:     Previously suppressed result.     AST (SGOT)   Date Value Ref Range Status   09/12/2018 23 15 - 41 U/L Final     Comment:     Previously suppressed result.     ALT (SGPT)   Date Value Ref Range Status   09/12/2018 18 14 - 54 U/L Final     Comment:     Previously suppressed result.     BILIRUBIN TOTAL   Date Value Ref Range Status   09/12/2018 0.8 0.3 - 1.2 mg/dL Final     Comment:     Previously suppressed result.        Lab Results   Component Value Date    CHOLESTEROL 212 (H) 09/12/2018    CHOLESTEROL 196 03/10/2015    HDLCHOL 66 09/12/2018    HDLCHOL 66 03/10/2015    LDLCHOL 133 (H) 09/12/2018    LDLCHOL 110  03/10/2015    TRIG 67 09/12/2018    TRIG 101 (U) 03/10/2015        Past Medical History  Current Outpatient Medications   Medication Sig   . amLODIPine (NORVASC) 10 mg Oral Tablet TAKE 1 TABLET DAILY   . aspirin (ECOTRIN) 81 mg Oral Tablet, Delayed Release (E.C.) Take 81 mg by mouth Once a day   . atorvastatin (LIPITOR) 20 mg Oral Tablet Take 1 Tab (20 mg total) by mouth Once a day (Patient not taking: Reported on 09/12/2018)   . bisoprolol-hydroCHLOROthiazide (ZIAC) 10-6.25 mg Oral Tablet TAKE 1 TABLET DAILY   . calcium citrate-vitamin D3 (CITRACAL) 200 mg calcium -250 unit Oral Tablet Take 1 Tab by mouth Once a day   . Flaxseed Oil 1,000 mg Oral Capsule Take by mouth   . fluticasone (FLONASE) 50 mcg/actuation Nasal Spray, Suspension 1 Spray by Each Nostril route Twice per day as needed (Patient not taking: Reported on 03/16/2018)   . multivitamin Oral Tablet Take 1 Tab by mouth Once a day   . timolol maleate (TIMOPTIC) 0.25 % Ophthalmic Drops Instill 1 Drop into right eye Twice daily     Allergies   Allergen Reactions   . Amoxicillin Rash     Past Medical History:   Diagnosis Date   . Breast mass, right     benign   . Cataract     bilat   . Family history of colon cancer    . Heartburn    . HTN (hypertension)    . Nausea with vomiting       sister with PONV   . Obesity    . Pulmonary emboli     only once in 1999 after her hysterectomy   . Wears glasses     reading         Past Surgical History:   Procedure Laterality Date   . HX BREAST LUMPECTOMY Right     benign   . HX CATARACT REMOVAL  10/2013    left   . HX HYSTERECTOMY  1999    fibroid   . HX ROTATOR CUFF REPAIR  11/04/2013    right shoulder   . INJECTION,TENDON SHEATH,LIG W/ [place] (AMB ONLY)  02/10/2016         Family Medical History:     Problem Relation (Age of Onset)    Breast Cancer Maternal Grandmother    Cancer Mother, Sister    Hypertension (High Blood Pressure) Mother    No Known Problems Brother, Maternal Grandfather, Paternal Grandmother, Paternal 7, Daughter, Son, Maternal 7, Maternal Uncle, Paternal 3, Paternal Uncle, Other    Stroke Mother, Father            Social History     Socioeconomic History   . Marital status: Divorced     Spouse name: Not on file   . Number of children: 0   . Years of education: Not on file   . Highest education level: Not on file   Occupational History     Employer: [employer]   Tobacco Use   . Smoking status: Never Smoker   . Smokeless tobacco: Never Used   Substance and Sexual Activity   . Alcohol use: No     Alcohol/week: 0.0 standard drinks   . Drug use: No   Other Topics Concern   . Abuse/Domestic Violence No   . Drives Yes   . Special Diet No   . Uses Cane No   . Uses  walker No   . Uses wheelchair No   . Right hand dominant Yes   . Left hand dominant No   . Ability to Walk 1 Flight of Steps without SOB/CP Yes   . Routine Exercise Yes     Comment: Zumba class   . Ability to Walk 2 Flight of Steps without SOB/CP Yes   . Ability To Do Own ADL's Yes         Review of Systems  Constitutional: Negative.    HENT: Negative.  except right ear feels fullness  Eyes: Negative.    Respiratory: Negative.    Cardiovascular: Negative.    Gastrointestinal: Negative.    Skin: Negative.    Neurological: Negative.    Psychiatric/Behavioral: Negative.     Objective:     BP 132/72   Pulse 54   Temp 36.1 C (96.9 F) (Tympanic)   Resp 12   Ht 1.651 m (5\' 5" )   Wt 73.9 kg (163 lb)   SpO2 99%   Breastfeeding No   BMI 27.12 kg/m          Physical Exam  Constitutional: She is oriented to person, place, and time. She appears well-developed and well-nourished.   HENT:   Head: Normocephalic and atraumatic.   Right Ear: External ear normal. TM with bulging, lots fluid in middle  ear.   Left Ear: External ear normal.   Nose with congestion   Eyes: Conjunctivae are normal. No scleral icterus.   Neck: Normal range of motion. Neck supple. Carotid bruit is not present. No thyroid mass and no thyromegaly present.   Cardiovascular: Normal rate, regular rhythm and normal pulses. Exam reveals no gallop.   Murmur heard.   Systolic murmur is present.  Pulmonary/Chest: Effort normal and breath sounds normal. She has no wheezes. She has no rales. She exhibits no tenderness.   Abdominal: Soft. Bowel sounds are normal. There is no tenderness.   Musculoskeletal: Normal range of motion.   Lymphadenopathy:     She has no cervical adenopathy.   Neurological: She is alert and oriented to person, place, and time. She has normal strength. Coordination and gait normal. GCS eye subscore is 4. GCS verbal subscore is 5. GCS motor subscore is 6.   Skin: Skin is warm and dry. Capillary refill takes less than 2 seconds. No cyanosis. Nails show no clubbing.   Psychiatric: She has a normal mood and affect. Her behavior is normal.   Nursing note and vitals reviewed.      Ortho Exam    Assessment & Plan:       ICD-10-CM    1. Essential hypertension I10 Hypertension well controlled, no significant medication side effects noted, needs to follow diet more regularly.     Plan:   current treatment plan is effective, no change in therapy, lab results and schedule of future lab studies reviewed with patient, repeat labs ordered prior to next appointment, orders and follow up as documented in [facility],  reviewed diet, exercise and weight control, recommended sodium restriction, cardiovascular risk and specific lipid/LDL goals reviewed, reviewed medications and side effects in detail, reviewed potential future medication changes and side effects.    COMPREHENSIVE METABOLIC PROFILE - [facility] ONLY   2. IFG (impaired fasting glucose) R73.01 HGA1C (HEMOGLOBIN A1C WITH EST AVG GLUCOSE)   3. Dysfunction of right eustachian tube H69.81 Drink plenty of water,take Tylenol and /or Ibuprofen as needed for pain and fever, nose irrigation with saline (mild salt water), gargle with  warm salt water 2-3 times a day.  Start Flonase   4. Hyperlipidemia, unspecified hyperlipidemia type E78.5 LIPID PANEL     COMPREHENSIVE METABOLIC PROFILE - [facility] ONLY   5. Screening for breast cancer Z12.39 MAMMO SCREENING BILATERAL W/ TOMO   6. BMI 27.0-27.9,adult Z68.27 BMI addressed: healthy diet     7. Medication management Z79.899 LIPID PANEL     COMPREHENSIVE METABOLIC PROFILE - [facility] ONLY     RTC in 6 months for PE

## 2018-09-12 NOTE — Nursing Note (Signed)
Carrie Escobar is a 72 y.o female who reports today for medication follow up. She has medical history to include hypertension and hyperlipidemia. ROS:not taking medications as instructed, no medication side effects noted, home BP monitoring in range of120-130systolic [id number] diastolic, no TIAs, no chest pain on exertion, no dyspnea on exertion, no swelling of ankles, no orthostatic dizziness or lightheadedness, no orthopnea or paroxysmal nocturnal dyspnea, no palpitations, no intermittent claudication symptoms.Pt does adhere to low salt diet.    She mentions she has Lipitor 20 mg at home which was prescribed 6 months ago due to CAD risk of 11.5%.  She has not yet started this medication.    She had echocardiogram for heart murmur 09/2017: acceptable LVF, + for mitral insufficiency.     Mariel Aloe, MA

## 2018-09-17 ENCOUNTER — Other Ambulatory Visit (INDEPENDENT_AMBULATORY_CARE_PROVIDER_SITE_OTHER): Payer: Self-pay | Admitting: Geriatric Medicine

## 2018-09-17 MED ORDER — METFORMIN ER 500 MG TABLET,EXTENDED RELEASE 24 HR
500.0000 mg | ORAL_TABLET | Freq: Every day | ORAL | 1 refills | Status: DC
Start: 2018-09-17 — End: 2020-03-03

## 2018-09-17 NOTE — Telephone Encounter (Signed)
Please review and sign.  Pt is aware to pick up.  Mariel Aloe, MA

## 2018-10-12 ENCOUNTER — Encounter (HOSPITAL_BASED_OUTPATIENT_CLINIC_OR_DEPARTMENT_OTHER): Payer: Self-pay

## 2018-10-12 ENCOUNTER — Ambulatory Visit (HOSPITAL_BASED_OUTPATIENT_CLINIC_OR_DEPARTMENT_OTHER)
Admission: RE | Admit: 2018-10-12 | Discharge: 2018-10-12 | Disposition: A | Discharge status: Home / Self Care | Payer: Medicare PPO | Source: Ambulatory Visit | Attending: Geriatric Medicine | Admitting: Geriatric Medicine

## 2018-10-12 DIAGNOSIS — Z1231 Encounter for screening mammogram for malignant neoplasm of breast: Secondary | ICD-10-CM | POA: Insufficient documentation

## 2018-10-12 DIAGNOSIS — Z1239 Encounter for other screening for malignant neoplasm of breast: Secondary | ICD-10-CM

## 2018-10-12 DIAGNOSIS — R921 Mammographic calcification found on diagnostic imaging of breast: Secondary | ICD-10-CM | POA: Insufficient documentation

## 2018-10-24 ENCOUNTER — Other Ambulatory Visit (INDEPENDENT_AMBULATORY_CARE_PROVIDER_SITE_OTHER): Payer: Self-pay | Admitting: Geriatric Medicine

## 2018-11-09 ENCOUNTER — Ambulatory Visit (INDEPENDENT_AMBULATORY_CARE_PROVIDER_SITE_OTHER): Payer: Self-pay | Admitting: Family Medicine

## 2018-12-07 ENCOUNTER — Ambulatory Visit (INDEPENDENT_AMBULATORY_CARE_PROVIDER_SITE_OTHER): Payer: Self-pay | Admitting: Family Medicine

## 2019-01-04 ENCOUNTER — Ambulatory Visit (INDEPENDENT_AMBULATORY_CARE_PROVIDER_SITE_OTHER): Payer: Self-pay | Admitting: Family Medicine

## 2019-01-06 ENCOUNTER — Other Ambulatory Visit (INDEPENDENT_AMBULATORY_CARE_PROVIDER_SITE_OTHER): Payer: Self-pay | Admitting: Geriatric Medicine

## 2019-03-14 ENCOUNTER — Telehealth (INDEPENDENT_AMBULATORY_CARE_PROVIDER_SITE_OTHER): Payer: Medicare PPO | Admitting: Geriatric Medicine

## 2019-03-14 ENCOUNTER — Encounter (INDEPENDENT_AMBULATORY_CARE_PROVIDER_SITE_OTHER): Payer: Self-pay | Admitting: Geriatric Medicine

## 2019-03-14 ENCOUNTER — Other Ambulatory Visit: Payer: Self-pay

## 2019-03-14 ENCOUNTER — Encounter (INDEPENDENT_AMBULATORY_CARE_PROVIDER_SITE_OTHER): Payer: Medicare PPO | Admitting: Geriatric Medicine

## 2019-03-14 VITALS — BP 115/75 | HR 55 | Resp 12 | Ht 65.0 in | Wt 163.0 lb

## 2019-03-14 DIAGNOSIS — I10 Essential (primary) hypertension: Secondary | ICD-10-CM

## 2019-03-14 DIAGNOSIS — Z79899 Other long term (current) drug therapy: Secondary | ICD-10-CM

## 2019-03-14 DIAGNOSIS — E7800 Pure hypercholesterolemia, unspecified: Secondary | ICD-10-CM

## 2019-03-14 DIAGNOSIS — R7301 Impaired fasting glucose: Secondary | ICD-10-CM

## 2019-03-14 DIAGNOSIS — F4321 Adjustment disorder with depressed mood: Secondary | ICD-10-CM

## 2019-03-14 DIAGNOSIS — F4381 Prolonged grief disorder: Secondary | ICD-10-CM

## 2019-03-14 DIAGNOSIS — Z6827 Body mass index (BMI) 27.0-27.9, adult: Secondary | ICD-10-CM

## 2019-03-14 NOTE — Progress Notes (Signed)
Subjective:     I have a pre existing relationship with this patient.  This patient was unable to come to the office due to [name] pandemic and patient safety.  TELEMEDICINE DOCUMENTATION:    Patient Location:   visit from home address: [address]  [place] [address]    Patient/family aware of provider location:  yes  Patient/family consent for telemedicine:  yes  Examination observed and performed by:  Kayleen Memos, MD      Patient ID:  Carrie Escobar is an 73 y.o. female     Chief Complaint:    Chief Complaint   Patient presents with   . Hypertension       The history is provided by the patient and medical records. No language interpreter was used.     Carrie Escobar is a56y.o female who reports today for medication follow up. She has medical history to include hypertension and hyperlipidemia. VFI:EPPIRJJOA medications as instructed, no medication side effects noted, home BP monitoring in range of110-120systolic [id number] diastolic, no TIAs, no chest pain on exertion, no dyspnea on exertion, no swelling of ankles, no orthostatic dizziness or lightheadedness, no orthopnea or paroxysmal nocturnal dyspnea, no palpitations, no intermittent claudication symptoms.    She has CAD risk 11.5% which was discussed at prior visit.  At this time has Lipitor 20 mg at home but has not started medication.      She is also not taking prescribed metformin for IFG.  Again has medication at home but has decided against taking.    Her sister passed away unexpectedly 10/23/2019.  She reports since then she has experienced bouts of depressed mood.  She is eating poor due to stress/depressed mood.    Appointment on 09/12/2018   Component Date Value Ref Range Status   . CHOLESTEROL 09/12/2018 212* 120 - 199 mg/dL Final   . HDL CHOL 09/12/2018 66  >39 mg/dL Final   . TRIGLYCERIDES 09/12/2018 67  <150 mg/dL Final   . LDL CALC 09/12/2018 133* <=130 mg/dL Final   . VLDL CALC 09/12/2018 13  5 - 35 mg/dL Final   .  CHOL/HDL RATIO 09/12/2018 3.2   Final    Risk Factor for Coronary Artery Disease (CAD) is as follows:                             FEMALE          FEMALE  1/2 Risk of CAD          3.4            3.3  Normal Risk of CAD       5.0            4.4  2x Risk of CAD           9.6            7.0  3x Risk of CAD          23.4           11.0   . SODIUM 09/12/2018 137  136 - 145 mmol/L Final   . POTASSIUM 09/12/2018 4.1  3.4 - 5.1 mmol/L Final   . CHLORIDE 09/12/2018 101  101 - 111 mmol/L Final   . CO2 TOTAL 09/12/2018 27  22 - 32 mmol/L Final   . ANION GAP 09/12/2018 9  3 - 11 mmol/L Final   . BUN 09/12/2018 9  6 - 20 mg/dL Final   . CREATININE 09/12/2018 0.94  0.44 - 1.00 mg/dL Final   . BUN/CREA RATIO 09/12/2018 10  6 - 22 Final   . ESTIMATED GFR 09/12/2018 >60  >60 mL/min/1.31m^2 Final   . ALBUMIN 09/12/2018 4.3  3.5 - 5.0 g/dL Final   . CALCIUM 09/12/2018 9.6  8.8 - 10.2 mg/dL Final   . GLUCOSE 09/12/2018 104  70 - 110 mg/dL Final   . ALKALINE PHOSPHATASE 09/12/2018 71  38 - 126 U/L Final    Previously suppressed result.   Marland Kitchen ALT (SGPT) 09/12/2018 18  14 - 54 U/L Final    Previously suppressed result.   . AST (SGOT) 09/12/2018 23  15 - 41 U/L Final    Previously suppressed result.   Marland Kitchen BILIRUBIN TOTAL 09/12/2018 0.8  0.3 - 1.2 mg/dL Final    Previously suppressed result.   Marland Kitchen PROTEIN TOTAL 09/12/2018 6.8  6.4 - 8.3 g/dL Final   . ALBUMIN/GLOBULIN RATIO 09/12/2018 1.7  0.8 - 2.0 Final   . ESTIMATED AVERAGE GLUCOSE 09/12/2018 131  mg/dL Final   . HEMOGLOBIN A1C 09/12/2018 6.2* 4.0 - 5.6 % Final         Past Medical History  Current Outpatient Medications   Medication Sig   . amLODIPine (NORVASC) 10 mg Oral Tablet TAKE 1 TABLET DAILY   . aspirin (ECOTRIN) 81 mg Oral Tablet, Delayed Release (E.C.) Take 81 mg by mouth Once a day   . atorvastatin (LIPITOR) 20 mg Oral Tablet Take 1 Tab (20 mg total) by mouth Once a day (Patient not taking: Reported on 09/12/2018)   . bisoprolol-hydroCHLOROthiazide (ZIAC) 10-6.25 mg Oral Tablet TAKE 1  TABLET DAILY   . calcium citrate-vitamin D3 (CITRACAL) 200 mg calcium -250 unit Oral Tablet Take 1 Tab by mouth Once a day   . Flaxseed Oil 1,000 mg Oral Capsule Take by mouth   . fluticasone (FLONASE) 50 mcg/actuation Nasal Spray, Suspension 1 Spray by Each Nostril route Twice per day as needed   . metFORMIN (GLUCOPHAGE XR) 500 mg Oral Tablet Sustained Release 24 hr Take 1 Tab (500 mg total) by mouth Once a day (Patient not taking: Reported on 03/14/2019)   . multivitamin Oral Tablet Take 1 Tab by mouth Once a day   . timolol maleate (TIMOPTIC) 0.25 % Ophthalmic Drops Instill 1 Drop into right eye Twice daily     Allergies   Allergen Reactions   . Amoxicillin Rash     Past Medical History:   Diagnosis Date   . Breast mass, right     benign   . Cataract     bilat   . Family history of colon cancer    . Heartburn    . HTN (hypertension)    . Nausea with vomiting     sister with PONV   . Obesity    . Pulmonary emboli     only once in 1999 after her hysterectomy   . Wears glasses     reading         Past Surgical History:   Procedure Laterality Date   . HX BREAST BIOPSY Right     BENIGN    . HX CATARACT REMOVAL  10/2013    left   . HX HYSTERECTOMY  1999    fibroid   . HX ROTATOR CUFF REPAIR  11/04/2013    right shoulder   . INJECTION,TENDON SHEATH,LIG W/ [place] (AMB ONLY)  02/10/2016  Family Medical History:     Problem Relation (Age of Onset)    Breast Cancer Maternal Grandmother    Cancer Mother, Sister    Heart Attack Sister    Hypertension (High Blood Pressure) Mother    No Known Problems Brother, Maternal Grandfather, Paternal Grandmother, Paternal 48, Daughter, Son, Maternal 37, Maternal Uncle, Paternal 7, Paternal Uncle, Other    Stroke Mother, Father            Social History     Socioeconomic History   . Marital status: Divorced     Spouse name: Not on file   . Number of children: 0   . Years of education: Not on file   . Highest education level: Not on file   Occupational History     Employer:  [employer]   Tobacco Use   . Smoking status: Never Smoker   . Smokeless tobacco: Never Used   Substance and Sexual Activity   . Alcohol use: No     Alcohol/week: 0.0 standard drinks   . Drug use: No   Other Topics Concern   . Abuse/Domestic Violence No   . Drives Yes   . Special Diet No   . Uses Cane No   . Uses walker No   . Uses wheelchair No   . Right hand dominant Yes   . Left hand dominant No   . Ability to Walk 1 Flight of Steps without SOB/CP Yes   . Routine Exercise Yes     Comment: Zumba class   . Ability to Walk 2 Flight of Steps without SOB/CP Yes   . Ability To Do Own ADL's Yes         Review of Systems   Constitutional: Positive for fatigue.   HENT: Negative.    Respiratory: Negative.    Cardiovascular: Negative.    Gastrointestinal: Negative.    Genitourinary: Negative.    Skin: Negative.    Neurological: Negative.    Hematological: Negative.    Psychiatric/Behavioral: Positive for dysphoric mood (mild). Negative for agitation, behavioral problems, confusion, decreased concentration, hallucinations, self-injury, sleep disturbance and suicidal ideas. The patient is not nervous/anxious and is not hyperactive.        Objective:     BP 115/75   Pulse 55   Resp 12   Ht 1.651 m (5\' 5" )   Wt 73.9 kg (163 lb)   BMI 27.12 kg/m          Physical Exam  Vitals signs reviewed.   Constitutional:       General: She is not in acute distress.     Appearance: She is not ill-appearing, toxic-appearing or diaphoretic.   Pulmonary:      Effort: Pulmonary effort is normal.   Neurological:      Mental Status: She is alert. Mental status is at baseline.   Psychiatric:         Attention and Perception: Attention and perception normal.         Mood and Affect: Mood is depressed (mild).         Speech: Speech normal.         Behavior: Behavior normal. Behavior is cooperative.         Thought Content: Thought content normal.         Cognition and Memory: Cognition and memory normal.             Ortho Exam    Assessment & Plan:  ICD-10-CM    1. Hypertension, unspecified type I10 LIPID PANEL     COMPREHENSIVE METABOLIC PROFILE - [facility] ONLY   2. Pure hypercholesterolemia E78.00 LIPID PANEL     COMPREHENSIVE METABOLIC PROFILE - [facility] ONLY   3. IFG (impaired fasting glucose) R73.01 HGA1C (HEMOGLOBIN A1C WITH EST AVG GLUCOSE)   4. 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 Medication management Z79.899 LIPID PANEL     COMPREHENSIVE METABOLIC PROFILE - [facility] ONLY     HGA1C (HEMOGLOBIN A1C WITH EST AVG GLUCOSE)       Hypertension well controlled, no significant medication side effects noted, needs to follow diet more regularly.     Plan:   current treatment plan is effective, no change in therapy, lab results and schedule of future lab studies reviewed with patient, repeat labs ordered prior to next appointment, orders and follow up as documented in [place], reviewed diet, exercise and weight control, recommended sodium restriction, cardiovascular risk and specific lipid/LDL goals reviewed, reviewed medications and side effects in detail, reviewed potential future medication changes and side effects    She stated that she is going to improve her dieting/ start getting more active and work on her mood and grief, no SI/HI/hallucinatio.  She stated that she is also going to start Lipitor and metformin and recheck BW in 3 months    BMI addressed: Advised on diet, weight loss, and exercise to reduce above normal BMI.  RTC 6 months.    I personally participated and evaluated the patient as part of an approved video/audiotelemedicine service.  See above note for additional information. The physical assessment, reviewed and interpreted all studies and the plan was mutually developed.

## 2019-04-22 ENCOUNTER — Other Ambulatory Visit (INDEPENDENT_AMBULATORY_CARE_PROVIDER_SITE_OTHER): Payer: Self-pay | Admitting: Geriatric Medicine

## 2019-08-29 ENCOUNTER — Other Ambulatory Visit: Payer: Self-pay

## 2019-08-29 ENCOUNTER — Ambulatory Visit: Payer: Self-pay | Attending: Geriatric Medicine

## 2019-08-29 DIAGNOSIS — R7301 Impaired fasting glucose: Secondary | ICD-10-CM | POA: Insufficient documentation

## 2019-08-29 DIAGNOSIS — I10 Essential (primary) hypertension: Secondary | ICD-10-CM | POA: Insufficient documentation

## 2019-08-29 DIAGNOSIS — E7800 Pure hypercholesterolemia, unspecified: Secondary | ICD-10-CM | POA: Insufficient documentation

## 2019-08-29 DIAGNOSIS — Z79899 Other long term (current) drug therapy: Secondary | ICD-10-CM | POA: Insufficient documentation

## 2019-08-29 LAB — COMPREHENSIVE METABOLIC PROFILE - BMC/JMC ONLY
ALBUMIN/GLOBULIN RATIO: 1.8 (ref 0.8–2.0)
ALBUMIN: 4.4 g/dL (ref 3.5–5.0)
ALKALINE PHOSPHATASE: 71 U/L (ref 38–126)
ALT (SGPT): 20 U/L (ref 14–54)
ANION GAP: 8 mmol/L (ref 3–11)
AST (SGOT): 22 U/L (ref 15–41)
BILIRUBIN TOTAL: 0.9 mg/dL (ref 0.3–1.2)
BUN/CREA RATIO: 9 (ref 6–22)
BUN: 9 mg/dL (ref 6–20)
CALCIUM: 10 mg/dL (ref 8.8–10.2)
CHLORIDE: 103 mmol/L (ref 101–111)
CO2 TOTAL: 26 mmol/L (ref 22–32)
CREATININE: 0.98 mg/dL (ref 0.44–1.00)
ESTIMATED GFR: >60 mL/min/{1.73_m2} (ref >60)
GLUCOSE: 111 mg/dL — ABNORMAL HIGH (ref 70–110)
POTASSIUM: 4 mmol/L (ref 3.4–5.1)
PROTEIN TOTAL: 6.8 g/dL (ref 6.4–8.3)
SODIUM: 137 mmol/L (ref 136–145)

## 2019-08-29 LAB — HGA1C (HEMOGLOBIN A1C WITH EST AVG GLUCOSE)
ESTIMATED AVERAGE GLUCOSE: 140 mg/dL
HEMOGLOBIN A1C: 6.5 % — ABNORMAL HIGH (ref 4.0–5.6)

## 2019-08-29 LAB — LIPID PANEL
CHOL/HDL RATIO: 2.6
CHOLESTEROL: 183 mg/dL (ref 120–199)
HDL CHOL: 71 mg/dL (ref >39)
LDL CALC: 94 mg/dL (ref <=130)
TRIGLYCERIDES: 91 mg/dL (ref <150)
VLDL CALC: 18 mg/dL (ref 5–35)

## 2019-09-11 ENCOUNTER — Encounter (INDEPENDENT_AMBULATORY_CARE_PROVIDER_SITE_OTHER): Payer: Self-pay | Admitting: Geriatric Medicine

## 2019-09-11 ENCOUNTER — Ambulatory Visit (INDEPENDENT_AMBULATORY_CARE_PROVIDER_SITE_OTHER): Payer: Medicare PPO | Admitting: Geriatric Medicine

## 2019-09-11 ENCOUNTER — Other Ambulatory Visit: Payer: Self-pay

## 2019-09-11 VITALS — BP 126/72 | HR 57 | Temp 96.7°F | Ht 65.0 in | Wt 174.0 lb

## 2019-09-11 DIAGNOSIS — Z1239 Encounter for other screening for malignant neoplasm of breast: Secondary | ICD-10-CM

## 2019-09-11 DIAGNOSIS — E785 Hyperlipidemia, unspecified: Secondary | ICD-10-CM

## 2019-09-11 DIAGNOSIS — I10 Essential (primary) hypertension: Secondary | ICD-10-CM

## 2019-09-11 DIAGNOSIS — Z6828 Body mass index (BMI) 28.0-28.9, adult: Secondary | ICD-10-CM

## 2019-09-11 DIAGNOSIS — Z1231 Encounter for screening mammogram for malignant neoplasm of breast: Secondary | ICD-10-CM

## 2019-09-11 DIAGNOSIS — R0782 Intercostal pain: Secondary | ICD-10-CM

## 2019-09-11 NOTE — Progress Notes (Signed)
Subjective:     Patient ID:  Carrie Escobar is an 73 y.o. female     Chief Complaint:    Chief Complaint   Patient presents with   . Essential Hypertension            HPI  Carrie Escobar is a 73 y.o female who reports today for medication follow up. She has medical history to include hypertension and hyperlipidemia. ROS: taking medications as instructed, no medication side effects noted, home BP monitoring in range of120-130systolic 0000000 diastolic, no TIAs, no chest pain on exertion, no dyspnea on exertion, no swelling of ankles, no orthostatic dizziness or lightheadedness, no orthopnea or paroxysmal nocturnal dyspnea, no palpitations, no intermittent claudication.   She c/o sharp , stabbing pain bilat side chest with radiatio  bilat to mid back , last 5 min, no N/v/c/d/abnormal sweating, no SOB/cough  Appointment on 08/29/2019   Component Date Value Ref Range Status   . CHOLESTEROL 08/29/2019 183  120 - 199 mg/dL Final   . HDL CHOL 08/29/2019 71  >39 mg/dL Final   . TRIGLYCERIDES 08/29/2019 91  <150 mg/dL Final   . LDL CALC 08/29/2019 94  <=130 mg/dL Final   . VLDL CALC 08/29/2019 18  5 - 35 mg/dL Final   . CHOL/HDL RATIO 08/29/2019 2.6   Final    Risk Factor for Coronary Artery Disease (CAD) is as follows:                             FEMALE          FEMALE  1/2 Risk of CAD          3.4            3.3  Normal Risk of CAD       5.0            4.4  2x Risk of CAD           9.6            7.0  3x Risk of CAD          23.4           11.0   . SODIUM 08/29/2019 137  136 - 145 mmol/L Final   . POTASSIUM 08/29/2019 4.0  3.4 - 5.1 mmol/L Final   . CHLORIDE 08/29/2019 103  101 - 111 mmol/L Final   . CO2 TOTAL 08/29/2019 26  22 - 32 mmol/L Final   . ANION GAP 08/29/2019 8  3 - 11 mmol/L Final   . BUN 08/29/2019 9  6 - 20 mg/dL Final   . CREATININE 08/29/2019 0.98  0.44 - 1.00 mg/dL Final   . BUN/CREA RATIO 08/29/2019 9  6 - 22 Final   . ESTIMATED GFR 08/29/2019 >60  >60 mL/min/1.108m^2 Final   . ALBUMIN  08/29/2019 4.4  3.5 - 5.0 g/dL Final   . CALCIUM 08/29/2019 10.0  8.8 - 10.2 mg/dL Final   . GLUCOSE 08/29/2019 111* 70 - 110 mg/dL Final   . ALKALINE PHOSPHATASE 08/29/2019 71  38 - 126 U/L Final   . ALT (SGPT) 08/29/2019 20  14 - 54 U/L Final   . AST (SGOT) 08/29/2019 22  15 - 41 U/L Final   . BILIRUBIN TOTAL 08/29/2019 0.9  0.3 - 1.2 mg/dL Final   . PROTEIN TOTAL 08/29/2019 6.8  6.4 - 8.3 g/dL Final   . ALBUMIN/GLOBULIN RATIO  08/29/2019 1.8  0.8 - 2.0 Final   . ESTIMATED AVERAGE GLUCOSE 08/29/2019 140  mg/dL Final   . HEMOGLOBIN A1C 08/29/2019 6.5* 4.0 - 5.6 % Final    Hemoglobin variant detected which does not interfere with this methodology. Consider Hemoglobin Electrophoresis Cascade if further characterization is necessary.       Last mammogram on 10/2018: WNL  Past Medical History  Current Outpatient Medications   Medication Sig   . amLODIPine (NORVASC) 10 mg Oral Tablet TAKE 1 TABLET DAILY   . aspirin (ECOTRIN) 81 mg Oral Tablet, Delayed Release (E.C.) Take 81 mg by mouth Once a day   . atorvastatin (LIPITOR) 20 mg Oral Tablet Take 1 Tab (20 mg total) by mouth Once a day   . bisoprolol-hydroCHLOROthiazide (ZIAC) 10-6.25 mg Oral Tablet TAKE 1 TABLET DAILY   . calcium citrate-vitamin D3 (CITRACAL) 200 mg calcium -250 unit Oral Tablet Take 1 Tab by mouth Once a day   . Flaxseed Oil 1,000 mg Oral Capsule Take by mouth   . fluticasone (FLONASE) 50 mcg/actuation Nasal Spray, Suspension 1 Spray by Each Nostril route Twice per day as needed   . metFORMIN (GLUCOPHAGE XR) 500 mg Oral Tablet Sustained Release 24 hr Take 1 Tab (500 mg total) by mouth Once a day   . multivitamin Oral Tablet Take 1 Tab by mouth Once a day   . timolol maleate (TIMOPTIC) 0.25 % Ophthalmic Drops Instill 1 Drop into right eye Twice daily     Allergies   Allergen Reactions   . Amoxicillin Rash     Past Medical History:   Diagnosis Date   . Breast mass, right     benign   . Cataract     bilat   . Family history of colon cancer    . Heartburn      . HTN (hypertension)    . Nausea with vomiting     sister with PONV   . Obesity    . Pulmonary emboli     only once in 1999 after her hysterectomy   . Wears glasses     reading         Past Surgical History:   Procedure Laterality Date   . HX BREAST BIOPSY Right     BENIGN    . HX CATARACT REMOVAL  10/2013    left   . HX HYSTERECTOMY  1999    fibroid   . HX ROTATOR CUFF REPAIR  11/04/2013    right shoulder   . INJECTION,TENDON SHEATH,LIG W/ [place] (AMB ONLY)  02/10/2016         Family Medical History:     Problem Relation (Age of Onset)    Breast Cancer Maternal Grandmother    Cancer Mother, Sister    Heart Attack Sister    Hypertension (High Blood Pressure) Mother    No Known Problems Brother, Maternal Grandfather, Paternal Grandmother, Paternal 54, Daughter, Son, Maternal 48, Maternal Uncle, Paternal 13, Paternal Uncle, Other    Stroke Mother, Father            Social History     Socioeconomic History   . Marital status: Divorced     Spouse name: Not on file   . Number of children: 0   . Years of education: Not on file   . Highest education level: Not on file   Occupational History     Employer: [employer]   Tobacco Use   . Smoking status: Never Smoker   .  Smokeless tobacco: Never Used   Substance and Sexual Activity   . Alcohol use: No     Alcohol/week: 0.0 standard drinks   . Drug use: No   Other Topics Concern   . Abuse/Domestic Violence No   . Drives Yes   . Special Diet No   . Uses Cane No   . Uses walker No   . Uses wheelchair No   . Right hand dominant Yes   . Left hand dominant No   . Ability to Walk 1 Flight of Steps without SOB/CP Yes   . Routine Exercise Yes     Comment: Zumba class   . Ability to Walk 2 Flight of Steps without SOB/CP Yes   . Ability To Do Own ADL's Yes         Review of Systems  Constitutional: Positive for fatigue.   HENT: Negative.    Respiratory: Negative.    Cardiovascular: Negative.    Gastrointestinal: Negative.    Genitourinary: Negative.    Skin: Negative.    Musculoskeletal: sharp pain bilat side of chest   Neurological: Negative.    Hematological: Negative.    Psychiatric/Behavioral: Positive for dysphoric mood (mild) but improving . Negative for agitation, behavioral problems, confusion, decreased concentration, hallucinations, self-injury, sleep disturbance and suicidal ideas. The patient is not nervous/anxious and is not hyperactive.      Objective:     BP 126/72 (Site: Left, Patient Position: Sitting, Cuff Size: Adult)   Pulse 57   Temp 35.9 C (96.7 F) (Thermal Scan)   Ht 1.651 m (5\' 5" )   Wt 78.9 kg (174 lb)   SpO2 99%   Breastfeeding No   BMI 28.96 kg/m          Physical Exam  Vitals signs and nursing note reviewed.   Constitutional:       General: She is not in acute distress.     Appearance: Normal appearance. She is not ill-appearing, toxic-appearing or diaphoretic.   HENT:      Head: Normocephalic and atraumatic.   Eyes:      General: No scleral icterus.     Conjunctiva/sclera: Conjunctivae normal.   Neck:      Musculoskeletal: Normal range of motion and neck supple.      Thyroid: No thyroid mass, thyromegaly or thyroid tenderness.      Vascular: No carotid bruit.   Cardiovascular:      Rate and Rhythm: Normal rate and regular rhythm.      Pulses: Normal pulses.      Heart sounds: Normal heart sounds.   Pulmonary:      Effort: Pulmonary effort is normal.      Breath sounds: Normal breath sounds.   Abdominal:      General: Bowel sounds are normal. There is no distension.      Palpations: Abdomen is soft. There is no mass.      Tenderness: There is no abdominal tenderness. There is no right CVA tenderness, left CVA tenderness, guarding or rebound.      Hernia: No hernia is present.   Lymphadenopathy:      Cervical: No cervical adenopathy.   Skin:     Coloration: Skin is not jaundiced.   Neurological:      Mental Status: She is alert and oriented to person, place, and time. Mental status is at baseline.      Cranial Nerves: No cranial nerve deficit.       Sensory: No sensory deficit.  Motor: No weakness.      Coordination: Coordination normal.      Gait: Gait normal.   Psychiatric:         Behavior: Behavior normal.         Thought Content: Thought content normal.           Ortho Exam    Assessment & Plan:       ICD-10-CM    1. Essential hypertension  I10    2. Hyperlipidemia, unspecified hyperlipidemia type  E78.5    3. Intercostal pain  R07.82 XR CHEST PA AND LATERAL   4. Other screening mammogram  Z12.31 MAMMO BILATERAL SCREENING-ADDL VIEWS/BREAST US AS REQ BY RAD   5. Breast screening  Z12.39 MAMMO BILATERAL SCREENING-ADDL VIEWS/BREAST US AS REQ BY RAD   6. BMI 28.0-28.9,adult  Z68.28      Hypertension/ HPl: well controlled, no significant medication side effects noted, needs to follow diet more regularly.     Plan:   current treatment plan is effective, no change in therapy, lab results and schedule of future lab studies reviewed with patient, orders and follow up as documented in [facility], reviewed diet, exercise and weight control, recommended sodium restriction, cardiovascular risk and specific lipid/LDL goals reviewed, reviewed medications and side effects in detail, reviewed potential future medication changes and side effects.  BMI addressed: Advised on diet, weight loss, and exercise to reduce above normal BMI.  RTC in 6 months

## 2019-09-11 NOTE — Nursing Note (Signed)
Carrie Escobar is a 74 y.o female who reports today for 6 month follow up. She has one new concern today which is a sharp shooting pain she gets in her ribs (lateral) since summer time, intermittent. Pt sts its a hard sharp pain that will cause her to have sit down or take a break. States the episode doesn't last more than 30 seconds.She has medical history to include hypertension and hyperlipidemia. ROS:not taking medications as instructed, no medication side effects noted, home BP monitoring in range of115-125systolic 0000000 diastolic, no TIAs, no chest pain on exertion, no dyspnea on exertion, no swelling of ankles, no orthostatic dizziness or lightheadedness, no orthopnea or paroxysmal nocturnal dyspnea, no palpitations, no intermittent claudication symptoms.Pt does adhere to low salt diet, working on her dieting and cutting out fast- food.     Blood pressure 126/72, pulse 57, temperature 35.9 C (96.7 F), temperature source Thermal Scan, height 1.651 m (5\' 5" ), weight 78.9 kg (174 lb), SpO2 99 %, not currently breastfeeding.

## 2019-09-18 ENCOUNTER — Telehealth (INDEPENDENT_AMBULATORY_CARE_PROVIDER_SITE_OTHER): Payer: Self-pay | Admitting: Geriatric Medicine

## 2019-09-18 NOTE — Telephone Encounter (Signed)
-----   Message from Kayleen Memos, MD sent at 09/18/2019  3:12 PM EST -----  CXR and mammogram were ordered   ----- Message -----  From: Gwinda Passe, [place]  Sent: 09/18/2019   3:02 PM EST  To: Kayleen Memos, MD    Pt is reporting ongoing rib pain which she states she mentioned at last visit.  "She c/o sharp , stabbing pain bilat side chest with radiatio  bilat to mid back , last 5 min, no N/v/c/d/abnormal sweating, no SOB/cough".  She is questioning if XR would be appropriate.    Also req mammogram.  Ok to order? Gwinda Passe, [place]

## 2019-09-18 NOTE — Progress Notes (Signed)
Pt aware. Meher Kucinski, MA

## 2019-09-19 ENCOUNTER — Ambulatory Visit
Admission: RE | Admit: 2019-09-19 | Discharge: 2019-09-19 | Disposition: A | Discharge status: Home / Self Care | Payer: Medicare PPO | Source: Ambulatory Visit | Attending: Geriatric Medicine | Admitting: Geriatric Medicine

## 2019-09-19 ENCOUNTER — Other Ambulatory Visit: Payer: Self-pay

## 2019-09-19 DIAGNOSIS — R0782 Intercostal pain: Secondary | ICD-10-CM | POA: Insufficient documentation

## 2019-09-23 ENCOUNTER — Telehealth (INDEPENDENT_AMBULATORY_CARE_PROVIDER_SITE_OTHER): Payer: Self-pay | Admitting: Geriatric Medicine

## 2019-09-23 NOTE — Telephone Encounter (Signed)
-----   Message from Kayleen Memos, MD sent at 09/21/2019  1:57 PM EST -----  Chest x Ray was normal

## 2019-09-23 NOTE — Telephone Encounter (Signed)
Lvm for pt regarding her xray results, normal.     Nichola Sizer, MA  09/23/2019, 10:28  [facility]  [address] [place] [address]  [phone]

## 2019-10-03 ENCOUNTER — Other Ambulatory Visit (INDEPENDENT_AMBULATORY_CARE_PROVIDER_SITE_OTHER): Payer: Self-pay | Admitting: Geriatric Medicine

## 2019-10-15 ENCOUNTER — Encounter (HOSPITAL_BASED_OUTPATIENT_CLINIC_OR_DEPARTMENT_OTHER): Payer: Self-pay

## 2019-10-15 ENCOUNTER — Ambulatory Visit
Admission: RE | Admit: 2019-10-15 | Discharge: 2019-10-15 | Disposition: A | Discharge status: Home / Self Care | Payer: Medicare PPO | Source: Ambulatory Visit | Attending: Geriatric Medicine | Admitting: Geriatric Medicine

## 2019-10-15 ENCOUNTER — Other Ambulatory Visit: Payer: Self-pay

## 2019-10-15 DIAGNOSIS — Z1231 Encounter for screening mammogram for malignant neoplasm of breast: Secondary | ICD-10-CM | POA: Insufficient documentation

## 2019-10-15 DIAGNOSIS — Z1239 Encounter for other screening for malignant neoplasm of breast: Secondary | ICD-10-CM

## 2019-11-15 ENCOUNTER — Other Ambulatory Visit (INDEPENDENT_AMBULATORY_CARE_PROVIDER_SITE_OTHER): Payer: Self-pay | Admitting: Geriatric Medicine

## 2019-11-15 MED ORDER — AMLODIPINE 10 MG TABLET
ORAL_TABLET | ORAL | 1 refills | Status: DC
Start: 2019-11-15 — End: 2020-05-27

## 2019-11-15 NOTE — Telephone Encounter (Signed)
Please review and sign. Pt states mailorder does not carry Norvasc and req sent to local.  Gwinda Passe, MA

## 2019-12-12 ENCOUNTER — Encounter

## 2020-01-17 LAB — LIPID PANEL
CHOLESTEROL: 179
HDL-CHOLESTEROL: 73
LDL (CALCULATED): 86
TRIGLYCERIDES: 101

## 2020-02-10 ENCOUNTER — Other Ambulatory Visit (INDEPENDENT_AMBULATORY_CARE_PROVIDER_SITE_OTHER): Payer: Self-pay | Admitting: Geriatric Medicine

## 2020-02-10 MED ORDER — BISOPROLOL 10 MG-HYDROCHLOROTHIAZIDE 6.25 MG TABLET
1.00 | ORAL_TABLET | Freq: Every morning | ORAL | 0 refills | Status: DC
Start: 2020-02-10 — End: 2020-02-14

## 2020-02-14 ENCOUNTER — Other Ambulatory Visit (INDEPENDENT_AMBULATORY_CARE_PROVIDER_SITE_OTHER): Payer: Self-pay | Admitting: Geriatric Medicine

## 2020-02-14 MED ORDER — BISOPROLOL 10 MG-HYDROCHLOROTHIAZIDE 6.25 MG TABLET
1.00 | ORAL_TABLET | Freq: Every morning | ORAL | 0 refills | Status: DC
Start: 2020-02-14 — End: 2020-05-08

## 2020-03-02 NOTE — Progress Notes (Signed)
Subjective:     Patient ID:  Carrie Escobar is an 74 y.o. female     Chief Complaint:    Chief Complaint   Patient presents with    Essential Hypertension    Hyperglycemia       The history is provided by the patient and medical records. No language interpreter was used.     74 y.o. female for follow-up of diabetes/HTN/HLD. Diabetic Review of Systems - medication compliance:  noncompliant some of the time, diabetic diet compliance:  compliant most of the time, home glucose monitoring: are not performed, further diabetic ROS: no polyuria or polydipsia, no chest pain, dyspnea or TIAs, no numbness, tingling or pain in extremities, no unusual visual symptoms, no hypoglycemia, no medication side effects noted, Her diabetic eye exam was in last month Dr. Hebert Soho in [place], [place].    Taking Metformin: no, was rxed in 2019, did not start  Taking ACE or ARB: no  Taking Statin: no, Lipitor was RXed---> did not start    Last foot exam: home checks WNL.  Last HbA1C: 08/29/2019 6.5.  Last lipid: 08/29/2019 TC 183, HDL 71, LDL 94, TG 91.  Last microalbuminuria test: none.  Flu vaccine: unknown  Pneumonia vaccine: unknown.     She has ben checking her blood pressure at home 120's over 80's.     Life screening tests were done , reviewed.   She started going to GYM, feels much better     COMPREHENSIVE METABOLIC PANEL  Lab Results   Component Value Date    SODIUM 137 08/29/2019    POTASSIUM 4.0 08/29/2019    CHLORIDE 103 08/29/2019    CO2 26 08/29/2019    ANIONGAP 8 08/29/2019    BUN 9 08/29/2019    CREATININE 0.98 08/29/2019    GLUCOSENF 111 (H) 08/29/2019    CALCIUM 10.0 08/29/2019    PHOSPHORUS 2.8 09/11/2015    ALBUMIN 4.4 08/29/2019    TOTALPROTEIN 6.8 08/29/2019    ALKPHOS 71 08/29/2019    AST 22 08/29/2019    ALT 20 08/29/2019         Past Medical History  Current Outpatient Medications   Medication Sig    amLODIPine (NORVASC) 10 mg Oral Tablet Take 1 tab PO daily    aspirin (ECOTRIN) 81 mg Oral Tablet,  Delayed Release (E.C.) Take 81 mg by mouth Once a day    bisoprolol-hydroCHLOROthiazide (ZIAC) 10-6.25 mg Oral Tablet Take 1 Tablet by mouth Every morning    calcium citrate-vitamin D3 (CITRACAL) 200 mg calcium -250 unit Oral Tablet Take 1 Tab by mouth Once a day    Flaxseed Oil 1,000 mg Oral Capsule Take by mouth    fluticasone (FLONASE) 50 mcg/actuation Nasal Spray, Suspension 1 Spray by Each Nostril route Twice per day as needed    multivitamin Oral Tablet Take 1 Tab by mouth Once a day    timolol maleate (TIMOPTIC) 0.25 % Ophthalmic Drops Instill 1 Drop into right eye Twice daily    TURMERIC ORAL Take by mouth     Allergies   Allergen Reactions    Amoxicillin Rash     Past Medical History:   Diagnosis Date    Breast mass, right     benign    Cataract     bilat    Family history of colon cancer     Heartburn     HTN (hypertension)     Nausea with vomiting     sister with PONV  Obesity     Pulmonary emboli     only once in 1999 after her hysterectomy    Wears glasses     reading         Past Surgical History:   Procedure Laterality Date    HX BREAST BIOPSY Right     BENIGN     HX CATARACT REMOVAL  10/2013    left    HX HYSTERECTOMY  1999    fibroid    HX ROTATOR CUFF REPAIR  11/04/2013    right shoulder    INJECTION,TENDON SHEATH,LIG W/ [place] (AMB ONLY)  02/10/2016         Family Medical History:     Problem Relation (Age of Onset)    Breast Cancer Maternal Grandmother    Cancer Mother, Sister    Heart Attack Sister    Hypertension (High Blood Pressure) Mother    No Known Problems Brother, Maternal Grandfather, Paternal Grandmother, Paternal Grandfather, Daughter, Son, Maternal 43, Maternal Uncle, Paternal 54, Paternal Uncle, Other    Stroke Mother, Father            Social History     Socioeconomic History    Marital status: Divorced     Spouse name: Not on file    Number of children: 0    Years of education: Not on file    Highest education level: Not on file   Occupational History      Employer: [employer]   Tobacco Use    Smoking status: Never Smoker    Smokeless tobacco: Never Used   Substance and Sexual Activity    Alcohol use: No     Alcohol/week: 0.0 standard drinks    Drug use: No   Other Topics Concern    Abuse/Domestic Violence No    Drives Yes    Special Diet No    Uses Cane No    Uses walker No    Uses wheelchair No    Right hand dominant Yes    Left hand dominant No    Ability to Walk 1 Flight of Steps without SOB/CP Yes    Routine Exercise Yes     Comment: Zumba class    Ability to Walk 2 Flight of Steps without SOB/CP Yes    Ability To Do Own ADL's Yes     Social Determinants of Health     Financial Resource Strain:     Difficulty of Paying Living Expenses:    Food Insecurity:     Worried About Charity fundraiser in the Last Year:     Arboriculturist in the Last Year:    Transportation Needs:     Film/video editor (Medical):     Lack of Transportation (Non-Medical):    Physical Activity:     Days of Exercise per Week:     Minutes of Exercise per Session:    Stress:     Feeling of Stress :    Intimate Partner Violence:     Fear of Current or Ex-Partner:     Emotionally Abused:     Physically Abused:     Sexually Abused:          Review of Systems  Constitutional: negative   HENT:Negative.   Respiratory:Negative.   Cardiovascular:Negative.   Gastrointestinal:Negative.   Genitourinary:Negative.   Skin:Negative.   Musculoskeletal: sharp pain bilat side of chest  Neurological:Negative.   Hematological:Negative.   Psychiatric/Behavioral: Positive fordysphoric mood(mild) but improving . Negative foragitation,behavioral  problems,confusion,decreased concentration,hallucinations,self-injury,sleep disturbanceand suicidal ideas. The patientis not nervous/anxiousand is not hyperactive.    Objective:     BP 110/80    Pulse 92    Temp 36.7 C (98 F)    Resp 13    Ht 1.651 m (5\' 5" )    Wt 72.6 kg (160 lb)    SpO2 92%    Breastfeeding No     BMI 26.63 kg/m          Physical Exam  Vitals signs and nursing note reviewed.   Constitutional:       General: She is not in acute distress.     Appearance: Normal appearance. She is not ill-appearing, toxic-appearing or diaphoretic.   HENT:      Head: Normocephalic and atraumatic.   Eyes:      General: No scleral icterus.     Conjunctiva/sclera: Conjunctivae normal.   Neck:      Musculoskeletal: Normal range of motion and neck supple.      Thyroid: No thyroid mass, thyromegaly or thyroid tenderness.      Vascular: No carotid bruit.   Cardiovascular:      Rate and Rhythm: Normal rate and regular rhythm.      Pulses: Normal pulses.      Heart sounds: Normal heart sounds.   Pulmonary:      Effort: Pulmonary effort is normal.      Breath sounds: Normal breath sounds.   Abdominal:      General: Bowel sounds are normal. There is no distension.      Palpations: Abdomen is soft. There is no mass.      Tenderness: There is no abdominal tenderness. There is no right CVA tenderness, left CVA tenderness, guarding or rebound.      Hernia: No hernia is present.   Lymphadenopathy:      Cervical: No cervical adenopathy.   Skin:     Coloration: Skin is not jaundiced.   Neurological:      Mental Status: She is alert and oriented to person, place, and time. Mental status is at baseline.      Cranial Nerves: No cranial nerve deficit.      Sensory: No sensory deficit.      Motor: No weakness.      Coordination: Coordination normal.      Gait: Gait normal.   Psychiatric:         Behavior: Behavior normal.         Thought Content: Thought content normal.         Ortho Exam    Assessment & Plan:       ICD-10-CM    1. Essential hypertension  I10 COMPREHENSIVE METABOLIC PNL, FASTING   2. IFG (impaired fasting glucose)  R73.01 HGA1C (HEMOGLOBIN A1C WITH EST AVG GLUCOSE)     COMPREHENSIVE METABOLIC PNL, FASTING     metFORMIN (GLUCOPHAGE XR) 500 mg Oral Tablet Sustained Release 24 hr   3. Medication management  Z79.899 HGA1C (HEMOGLOBIN A1C  WITH EST AVG GLUCOSE)     COMPREHENSIVE METABOLIC PNL, FASTING   4. BMI 26.0-26.9,adult  Z68.26      Hypertension well controlled, no significant medication side effects noted, needs to follow diet more regularly.     Plan:   current treatment plan is effective, no change in therapy, lab results and schedule of future lab studies reviewed with patient, repeat labs ordered prior to next appointment, orders and follow up as documented in [place], reviewed diet, exercise and  weight control, recommended sodium restriction, cardiovascular risk and specific lipid/LDL goals reviewed, reviewed medications and side effects in detail, reviewed potential future medication changes and side effects, specific diabetic recommendations low cholesterol diet, weight control and daily exercise discussed, home glucose monitoring emphasized, all medications, side effects and compliance discussed carefully, annual eye examinations at [facility] discussed and long term diabetic complications discussed.        BMI addressed: Advised on diet, weight loss, and exercise to reduce above normal BMI.  RTC 6 months for PE

## 2020-03-03 ENCOUNTER — Ambulatory Visit (INDEPENDENT_AMBULATORY_CARE_PROVIDER_SITE_OTHER): Payer: Medicare PPO | Admitting: Geriatric Medicine

## 2020-03-03 ENCOUNTER — Ambulatory Visit: Payer: Medicare PPO | Attending: Geriatric Medicine

## 2020-03-03 ENCOUNTER — Other Ambulatory Visit: Payer: Self-pay

## 2020-03-03 ENCOUNTER — Encounter (INDEPENDENT_AMBULATORY_CARE_PROVIDER_SITE_OTHER): Payer: Self-pay | Admitting: Geriatric Medicine

## 2020-03-03 VITALS — BP 110/80 | HR 92 | Temp 98.0°F | Resp 13 | Ht 65.0 in | Wt 160.0 lb

## 2020-03-03 DIAGNOSIS — R7301 Impaired fasting glucose: Secondary | ICD-10-CM | POA: Insufficient documentation

## 2020-03-03 DIAGNOSIS — E119 Type 2 diabetes mellitus without complications: Secondary | ICD-10-CM

## 2020-03-03 DIAGNOSIS — Z79899 Other long term (current) drug therapy: Secondary | ICD-10-CM

## 2020-03-03 DIAGNOSIS — I10 Essential (primary) hypertension: Secondary | ICD-10-CM

## 2020-03-03 DIAGNOSIS — Z6826 Body mass index (BMI) 26.0-26.9, adult: Secondary | ICD-10-CM

## 2020-03-03 DIAGNOSIS — E7800 Pure hypercholesterolemia, unspecified: Secondary | ICD-10-CM

## 2020-03-03 LAB — COMPREHENSIVE METABOLIC PNL, FASTING
ALBUMIN: 4.2 g/dL (ref 3.4–4.8)
ALKALINE PHOSPHATASE: 66 U/L (ref 55–145)
ALT (SGPT): 12 U/L (ref 8–22)
ANION GAP: 6 mmol/L (ref 4–13)
AST (SGOT): 17 U/L (ref 8–45)
BILIRUBIN TOTAL: 0.7 mg/dL (ref 0.3–1.3)
BUN/CREA RATIO: 13 (ref 6–22)
BUN: 12 mg/dL (ref 8–25)
CALCIUM: 9.7 mg/dL (ref 8.8–10.2)
CHLORIDE: 104 mmol/L (ref 96–111)
CO2 TOTAL: 29 mmol/L (ref 23–31)
CREATININE: 0.96 mg/dL (ref 0.60–1.05)
ESTIMATED GFR: 69 mL/min/BSA (ref >=60)
GLUCOSE: 111 mg/dL (ref 65–125)
POTASSIUM: 3.7 mmol/L (ref 3.5–5.1)
PROTEIN TOTAL: 7.1 g/dL (ref 6.0–8.0)
SODIUM: 139 mmol/L (ref 136–145)

## 2020-03-03 LAB — HGA1C (HEMOGLOBIN A1C WITH EST AVG GLUCOSE)
ESTIMATED AVERAGE GLUCOSE: 128 mg/dL
HEMOGLOBIN A1C: 6.1 % — ABNORMAL HIGH (ref 4.0–5.6)

## 2020-03-03 MED ORDER — METFORMIN ER 500 MG TABLET,EXTENDED RELEASE 24 HR
500.0000 mg | ORAL_TABLET | Freq: Every day | ORAL | 1 refills | Status: DC
Start: 2020-03-03 — End: 2020-09-15

## 2020-03-03 NOTE — Nursing Note (Signed)
Carrie Escobar is a 74 y.o female here for follow up of DM, HTN.  She has ben checking her blood pressure at home 120's over 80's. Her glucose checks are not done. She was prescribed metformin and statin but did not start either of these medication as directed. Her diabetic eye exam was in last month Dr. Hebert Soho in [place], [place]. [place], [place]

## 2020-05-08 ENCOUNTER — Other Ambulatory Visit (INDEPENDENT_AMBULATORY_CARE_PROVIDER_SITE_OTHER): Payer: Self-pay | Admitting: Geriatric Medicine

## 2020-05-27 ENCOUNTER — Other Ambulatory Visit (INDEPENDENT_AMBULATORY_CARE_PROVIDER_SITE_OTHER): Payer: Self-pay | Admitting: Geriatric Medicine

## 2020-06-23 ENCOUNTER — Ambulatory Visit (INDEPENDENT_AMBULATORY_CARE_PROVIDER_SITE_OTHER): Payer: Medicare PPO | Admitting: Geriatric Medicine

## 2020-06-23 ENCOUNTER — Other Ambulatory Visit: Payer: Self-pay

## 2020-06-23 ENCOUNTER — Encounter (INDEPENDENT_AMBULATORY_CARE_PROVIDER_SITE_OTHER): Payer: Self-pay | Admitting: Geriatric Medicine

## 2020-06-23 VITALS — BP 138/74 | HR 56 | Temp 97.0°F | Resp 13 | Ht 65.0 in | Wt 150.0 lb

## 2020-06-23 DIAGNOSIS — R413 Other amnesia: Secondary | ICD-10-CM

## 2020-06-23 DIAGNOSIS — R7301 Impaired fasting glucose: Secondary | ICD-10-CM

## 2020-06-23 DIAGNOSIS — Z6824 Body mass index (BMI) 24.0-24.9, adult: Secondary | ICD-10-CM

## 2020-06-23 DIAGNOSIS — R634 Abnormal weight loss: Secondary | ICD-10-CM

## 2020-06-23 NOTE — Progress Notes (Signed)
Subjective:     Patient ID:  Carrie Escobar is an 74 y.o. female     Chief Complaint:    Chief Complaint   Patient presents with   . Fatigue       The history is provided by the patient and medical records. No language interpreter was used.     Carrie Escobar is a 75 y.o female here with concern of cognitive decline.  He has PMH to include DM, HTN.  She feels over the last 3 weeks she has trouble staying focused, declining memory to include trouble with recalling friends names. She denies she has trouble with memory during driving, locations, cooking, safety. No concern with gun safety as she does not own firearm.     No anxiety or depression. She has sleep disturbances. She is only care taker for her brother.  Worrying of him causes her to sleep light as she wakes when he wakes. No snoring, apnea episode. She confirms daytime fatigue.     No home glucose checks.      she will recognize faces but has trouble recalling names. Denies trouble remembering locations or how to get places. Forgetfulness happens at random but patient will always remember the name later on. Denies any triggering event but notes recently has been very busy helping her brother move into his new apartment. Reports decreased appetite (only had glass of orange juice today) and "running warm." Also reports daytime fatigue - will often have to stop what she is doing to lean against something because she is so tired. Denies N/V/D/C, polyuria, dizziness, SoB, neuropathic symptoms (gait, sensation intact), sick contacts, acute muscle weakness, injury.     Note patient reports no issues obtaining medication from pharmacy. She is not checking blood glucose at home ("too busy running around") and is also forgetting to take her medication. States she takes it sometimes but not others.     Notable FHx includes mother: CVA in 17's, Alzheimer's in 26's... father: CVA in 24's (CoD)     Past Medical History  Current Outpatient Medications   Medication  Sig   . amLODIPine (NORVASC) 10 mg Oral Tablet TAKE 1 TABLET BY MOUTH EVERY DAY   . aspirin (ECOTRIN) 81 mg Oral Tablet, Delayed Release (E.C.) Take 81 mg by mouth Once a day   . bisoprolol-hydroCHLOROthiazide (ZIAC) 10-6.25 mg Oral Tablet TAKE 1 TABLET BY MOUTH EVERY MORNING   . calcium citrate-vitamin D3 (CITRACAL) 200 mg calcium -250 unit Oral Tablet Take 1 Tab by mouth Once a day   . Flaxseed Oil 1,000 mg Oral Capsule Take by mouth   . fluticasone (FLONASE) 50 mcg/actuation Nasal Spray, Suspension 1 Spray by Each Nostril route Twice per day as needed   . metFORMIN (GLUCOPHAGE XR) 500 mg Oral Tablet Sustained Release 24 hr Take 1 Tablet (500 mg total) by mouth Once a day   . multivitamin Oral Tablet Take 1 Tab by mouth Once a day   . timolol maleate (TIMOPTIC) 0.25 % Ophthalmic Drops Instill 1 Drop into right eye Twice daily     Allergies   Allergen Reactions   . Amoxicillin Rash     Past Medical History:   Diagnosis Date   . Breast mass, right     benign   . Cataract     bilat   . Family history of colon cancer    . Heartburn    . HTN (hypertension)    . Nausea with vomiting  sister with PONV   . Obesity    . Pulmonary emboli     only once in 1999 after her hysterectomy   . Wears glasses     reading         Past Surgical History:   Procedure Laterality Date   . 67591 - INJ SINGLE TENDON SHEATH/LIGAMENT, APONEUROSIS W/ [place] INTERP ONLY (AMB ONLY)  02/10/2016   . HX BREAST BIOPSY Right     BENIGN    . HX CATARACT REMOVAL  10/2013    left   . HX HYSTERECTOMY  1999    fibroid   . HX ROTATOR CUFF REPAIR  11/04/2013    right shoulder         Family Medical History:     Problem Relation (Age of Onset)    Breast Cancer Maternal Grandmother    Cancer Mother, Sister    Heart Attack Sister    Hypertension (High Blood Pressure) Mother    No Known Problems Brother, Maternal Grandfather, Paternal Grandmother, Paternal 55, Daughter, Son, Maternal 40, Maternal Uncle, Paternal 2, Paternal Uncle, Other    Stroke  Mother, Father            Social History     Socioeconomic History   . Marital status: Divorced     Spouse name: Not on file   . Number of children: 0   . Years of education: Not on file   . Highest education level: Not on file   Occupational History     Employer: [employer]   Tobacco Use   . Smoking status: Never Smoker   . Smokeless tobacco: Never Used   Substance and Sexual Activity   . Alcohol use: No     Alcohol/week: 0.0 standard drinks   . Drug use: No   Other Topics Concern   . Abuse/Domestic Violence No   . Drives Yes   . Special Diet No   . Uses Cane No   . Uses walker No   . Uses wheelchair No   . Right hand dominant Yes   . Left hand dominant No   . Ability to Walk 1 Flight of Steps without SOB/CP Yes   . Routine Exercise Yes     Comment: Zumba class   . Ability to Walk 2 Flight of Steps without SOB/CP Yes   . Ability To Do Own ADL's Yes     Social Determinants of Health     Financial Resource Strain:    . Difficulty of Paying Living Expenses:    Food Insecurity:    . Worried About Charity fundraiser in the Last Year:    . Arboriculturist in the Last Year:    Transportation Needs:    . Film/video editor (Medical):    Marland Kitchen Lack of Transportation (Non-Medical):    Physical Activity:    . Days of Exercise per Week:    . Minutes of Exercise per Session:    Stress:    . Feeling of Stress :    Intimate Partner Violence:    . Fear of Current or Ex-Partner:    . Emotionally Abused:    Marland Kitchen Physically Abused:    . Sexually Abused:          Review of Systems   Constitutional: Positive for activity change (Extremely busy with brother), appetite change (Lack of appetite ), fatigue and unexpected weight change (10 lb loss identified on exam).   HENT: Negative.  Respiratory: Negative.    Cardiovascular: Negative.    Gastrointestinal: Negative.    Endocrine: Positive for heat intolerance.   Genitourinary: Negative.    Musculoskeletal: Negative.    Skin: Negative.    Neurological: Negative.    Hematological: Negative.     Psychiatric/Behavioral: Positive for confusion (reports trouble remembering names), decreased concentration and sleep disturbance (Constantly waking up in night to check on brother). Negative for agitation, behavioral problems, dysphoric mood, hallucinations, self-injury and suicidal ideas. The patient is nervous/anxious. The patient is not hyperactive.        Objective:     BP 138/74   Pulse 56   Temp 36.1 C (97 F)   Resp 13   Ht 1.651 m (5\' 5" )   Wt 68 kg (150 lb)   SpO2 97%   BMI 24.96 kg/m          Physical Exam  Vitals and nursing note reviewed.   Constitutional:       General: She is not in acute distress.     Appearance: Normal appearance. She is normal weight. She is not ill-appearing, toxic-appearing or diaphoretic.   HENT:      Head: Normocephalic and atraumatic.      Right Ear: External ear normal.      Left Ear: External ear normal.   Eyes:      General: No scleral icterus.     Extraocular Movements: Extraocular movements intact.      Conjunctiva/sclera: Conjunctivae normal.   Neck:      Vascular: No carotid bruit.   Cardiovascular:      Rate and Rhythm: Normal rate and regular rhythm.      Pulses: Normal pulses.      Heart sounds: Murmur (Diastolic murmur) heard.     Pulmonary:      Effort: Pulmonary effort is normal.      Breath sounds: Normal breath sounds.   Abdominal:      General: Abdomen is flat. Bowel sounds are normal.      Palpations: Abdomen is soft.   Musculoskeletal:         General: Normal range of motion.      Cervical back: Normal range of motion and neck supple.      Right lower leg: No edema.      Left lower leg: No edema.   Lymphadenopathy:      Cervical: No cervical adenopathy.   Skin:     General: Skin is warm and dry.      Coloration: Skin is not jaundiced.   Neurological:      General: No focal deficit present.      Mental Status: She is alert and oriented to person, place, and time. Mental status is at baseline.      Motor: No weakness.      Gait: Gait normal.    Psychiatric:         Mood and Affect: Mood normal.         Behavior: Behavior normal.         Thought Content: Thought content normal.         Judgment: Judgment normal.             Ortho Exam    Assessment & Plan:       ICD-10-CM    1. Weight loss  R63.4 THYROID STIMULATING HORMONE WITH FREE T4 REFLEX     CBC/DIFF     COMPREHENSIVE METABOLIC PNL, FASTING   2. Memory problem  R41.3 VITAMIN B12     THYROID STIMULATING HORMONE WITH FREE T4 REFLEX     CBC/DIFF     COMPREHENSIVE METABOLIC PNL, FASTING     SYPHILIS TITER MONITORING   3. IFG (impaired fasting glucose)  R73.01 HGA1C (HEMOGLOBIN A1C WITH EST AVG GLUCOSE)   4. BMI 24.0-24.9, adult  Z68.24          BMI addressed: healthy diet     Fasting blood work ordered for patient with instructions to do either today or tomorrow.   Spoke at length about how many of the patient's concerns can be explained by lack of sleep, recommended daytime naps. Patient refuses nighttime sleeping aid, citing concerns for her brother.     Will follow up with patient based on lab results.

## 2020-06-24 ENCOUNTER — Ambulatory Visit: Payer: Medicare PPO | Attending: Geriatric Medicine

## 2020-06-24 DIAGNOSIS — G9341 Metabolic encephalopathy: Secondary | ICD-10-CM | POA: Diagnosis present

## 2020-06-24 DIAGNOSIS — Z7984 Long term (current) use of oral hypoglycemic drugs: Secondary | ICD-10-CM

## 2020-06-24 DIAGNOSIS — R001 Bradycardia, unspecified: Secondary | ICD-10-CM | POA: Diagnosis not present

## 2020-06-24 DIAGNOSIS — E222 Syndrome of inappropriate secretion of antidiuretic hormone: Secondary | ICD-10-CM | POA: Diagnosis not present

## 2020-06-24 DIAGNOSIS — R7301 Impaired fasting glucose: Secondary | ICD-10-CM | POA: Insufficient documentation

## 2020-06-24 DIAGNOSIS — R634 Abnormal weight loss: Secondary | ICD-10-CM | POA: Insufficient documentation

## 2020-06-24 DIAGNOSIS — E119 Type 2 diabetes mellitus without complications: Secondary | ICD-10-CM | POA: Diagnosis present

## 2020-06-24 DIAGNOSIS — Z20822 Contact with and (suspected) exposure to covid-19: Secondary | ICD-10-CM | POA: Diagnosis present

## 2020-06-24 DIAGNOSIS — I10 Essential (primary) hypertension: Secondary | ICD-10-CM | POA: Diagnosis present

## 2020-06-24 DIAGNOSIS — R413 Other amnesia: Secondary | ICD-10-CM

## 2020-06-24 DIAGNOSIS — E041 Nontoxic single thyroid nodule: Secondary | ICD-10-CM | POA: Diagnosis present

## 2020-06-24 DIAGNOSIS — Z7982 Long term (current) use of aspirin: Secondary | ICD-10-CM

## 2020-06-24 DIAGNOSIS — C718 Malignant neoplasm of overlapping sites of brain: Principal | ICD-10-CM | POA: Diagnosis present

## 2020-06-24 LAB — CBC WITH DIFF
BASOPHIL #: 0 10*3/uL (ref 0.00–0.10)
BASOPHIL %: 0 % (ref 0–3)
EOSINOPHIL #: 0.1 10*3/uL (ref 0.00–0.50)
EOSINOPHIL %: 1 % (ref 0–5)
HCT: 37.6 % (ref 36.0–45.0)
HGB: 12.8 g/dL (ref 12.0–15.5)
LYMPHOCYTE #: 2.2 10*3/uL (ref 1.00–4.80)
LYMPHOCYTE %: 27 % (ref 15–43)
MCH: 29.1 pg (ref 27.5–33.2)
MCHC: 34.2 g/dL (ref 32.0–36.0)
MCV: 85.2 fL (ref 82.0–97.0)
MONOCYTE #: 0.5 10*3/uL (ref 0.20–0.90)
MONOCYTE %: 6 % (ref 5–12)
MPV: 9.1 fL (ref 7.4–10.5)
NEUTROPHIL #: 5.3 10*3/uL (ref 1.50–6.50)
NEUTROPHIL %: 65 % (ref 43–76)
PLATELETS: 225 10*3/uL (ref 150–450)
RBC: 4.41 10*6/uL (ref 4.00–5.10)
RDW: 14.3 % (ref 11.0–16.0)
WBC: 8.2 10*3/uL (ref 4.0–11.0)

## 2020-06-24 LAB — THYROID STIMULATING HORMONE WITH FREE T4 REFLEX: TSH: 1.575 u[IU]/mL (ref 0.430–3.550)

## 2020-06-24 LAB — HGA1C (HEMOGLOBIN A1C WITH EST AVG GLUCOSE)
ESTIMATED AVERAGE GLUCOSE: 137 mg/dL
HEMOGLOBIN A1C: 6.4 % — ABNORMAL HIGH (ref 4.0–5.6)

## 2020-06-24 LAB — COMPREHENSIVE METABOLIC PNL, FASTING
ALBUMIN: 4.4 g/dL (ref 3.4–4.8)
ALKALINE PHOSPHATASE: 56 U/L (ref 55–145)
ALT (SGPT): 12 U/L (ref 8–22)
ANION GAP: 11 mmol/L (ref 4–13)
AST (SGOT): 19 U/L (ref 8–45)
BILIRUBIN TOTAL: 0.6 mg/dL (ref 0.3–1.3)
BUN/CREA RATIO: 7 (ref 6–22)
BUN: 7 mg/dL — ABNORMAL LOW (ref 8–25)
CALCIUM: 9.5 mg/dL (ref 8.8–10.2)
CHLORIDE: 100 mmol/L (ref 96–111)
CO2 TOTAL: 27 mmol/L (ref 23–31)
CREATININE: 0.95 mg/dL (ref 0.60–1.05)
ESTIMATED GFR: 70 mL/min/BSA (ref >=60)
GLUCOSE: 127 mg/dL — ABNORMAL HIGH (ref 70–99)
POTASSIUM: 3 mmol/L — ABNORMAL LOW (ref 3.5–5.1)
PROTEIN TOTAL: 7 g/dL (ref 6.0–8.0)
SODIUM: 138 mmol/L (ref 136–145)

## 2020-06-24 LAB — VITAMIN B12: VITAMIN B 12: 708 pg/mL (ref 200–900)

## 2020-06-25 LAB — SYPHILIS SCREENING ALGORITHM WITH REFLEX (TITER, TP-PA), SERUM: TREPONEMAL AB QUALITATIVE: NONREACTIVE

## 2020-06-27 ENCOUNTER — Inpatient Hospital Stay
Admission: EM | Admit: 2020-06-27 | Discharge: 2020-07-08 | DRG: 025 | Disposition: A | Discharge status: Home Health | Payer: Medicare PPO | Attending: HOSPITALIST | Admitting: HOSPITALIST

## 2020-06-27 ENCOUNTER — Emergency Department (EMERGENCY_DEPARTMENT_HOSPITAL): Payer: Medicare PPO

## 2020-06-27 ENCOUNTER — Other Ambulatory Visit: Payer: Self-pay

## 2020-06-27 ENCOUNTER — Encounter (HOSPITAL_COMMUNITY): Payer: Self-pay

## 2020-06-27 ENCOUNTER — Inpatient Hospital Stay (HOSPITAL_COMMUNITY): Payer: Medicare PPO | Admitting: HOSPITALIST

## 2020-06-27 ENCOUNTER — Emergency Department (HOSPITAL_COMMUNITY): Payer: Medicare PPO

## 2020-06-27 DIAGNOSIS — Z7982 Long term (current) use of aspirin: Secondary | ICD-10-CM

## 2020-06-27 DIAGNOSIS — R001 Bradycardia, unspecified: Secondary | ICD-10-CM | POA: Diagnosis not present

## 2020-06-27 DIAGNOSIS — R4182 Altered mental status, unspecified: Secondary | ICD-10-CM

## 2020-06-27 DIAGNOSIS — G9341 Metabolic encephalopathy: Secondary | ICD-10-CM | POA: Diagnosis present

## 2020-06-27 DIAGNOSIS — Z7984 Long term (current) use of oral hypoglycemic drugs: Secondary | ICD-10-CM

## 2020-06-27 DIAGNOSIS — E041 Nontoxic single thyroid nodule: Secondary | ICD-10-CM | POA: Diagnosis present

## 2020-06-27 DIAGNOSIS — G9389 Other specified disorders of brain: Secondary | ICD-10-CM | POA: Diagnosis present

## 2020-06-27 DIAGNOSIS — I10 Essential (primary) hypertension: Secondary | ICD-10-CM | POA: Diagnosis present

## 2020-06-27 DIAGNOSIS — E119 Type 2 diabetes mellitus without complications: Secondary | ICD-10-CM | POA: Diagnosis present

## 2020-06-27 DIAGNOSIS — C718 Malignant neoplasm of overlapping sites of brain: Principal | ICD-10-CM | POA: Diagnosis present

## 2020-06-27 DIAGNOSIS — C719 Malignant neoplasm of brain, unspecified: Secondary | ICD-10-CM

## 2020-06-27 DIAGNOSIS — R413 Other amnesia: Secondary | ICD-10-CM | POA: Diagnosis present

## 2020-06-27 DIAGNOSIS — E222 Syndrome of inappropriate secretion of antidiuretic hormone: Secondary | ICD-10-CM | POA: Diagnosis not present

## 2020-06-27 DIAGNOSIS — Z20822 Contact with and (suspected) exposure to covid-19: Secondary | ICD-10-CM | POA: Diagnosis present

## 2020-06-27 HISTORY — DX: Type 2 diabetes mellitus without complications (CMS HCC): E11.9

## 2020-06-27 HISTORY — DX: Nontoxic single thyroid nodule: E04.1

## 2020-06-27 HISTORY — DX: Malignant (primary) neoplasm, unspecified (CMS HCC): C80.1

## 2020-06-27 LAB — CBC WITH DIFF
BASOPHIL #: 0.1 10*3/uL (ref 0.00–0.10)
BASOPHIL %: 1 % (ref 0–3)
EOSINOPHIL #: 0.1 10*3/uL (ref 0.00–0.50)
EOSINOPHIL %: 2 % (ref 0–5)
HCT: 39.5 % (ref 36.0–45.0)
HGB: 13.5 g/dL (ref 12.0–15.5)
LYMPHOCYTE #: 1.6 10*3/uL (ref 1.00–4.80)
LYMPHOCYTE %: 18 % (ref 15–43)
MCH: 29.1 pg (ref 27.5–33.2)
MCHC: 34.3 g/dL (ref 32.0–36.0)
MCV: 85 fL (ref 82.0–97.0)
MONOCYTE #: 0.6 10*3/uL (ref 0.20–0.90)
MONOCYTE %: 7 % (ref 5–12)
MPV: 8.3 fL (ref 7.4–10.5)
NEUTROPHIL #: 6.5 10*3/uL (ref 1.50–6.50)
NEUTROPHIL %: 74 % (ref 43–76)
PLATELETS: 225 10*3/uL (ref 150–450)
RBC: 4.65 10*6/uL (ref 4.00–5.10)
RDW: 14.2 % (ref 11.0–16.0)
WBC: 8.8 10*3/uL (ref 4.0–11.0)

## 2020-06-27 LAB — COMPREHENSIVE METABOLIC PANEL, NON-FASTING
ALBUMIN: 4.6 g/dL (ref 3.4–4.8)
ALKALINE PHOSPHATASE: 59 U/L (ref 55–145)
ALT (SGPT): 14 U/L (ref 8–22)
ANION GAP: 10 mmol/L (ref 4–13)
AST (SGOT): 21 U/L (ref 8–45)
BILIRUBIN TOTAL: 0.8 mg/dL (ref 0.3–1.3)
BUN/CREA RATIO: 8 (ref 6–22)
BUN: 7 mg/dL — ABNORMAL LOW (ref 8–25)
CALCIUM: 10.4 mg/dL — ABNORMAL HIGH (ref 8.8–10.2)
CHLORIDE: 103 mmol/L (ref 96–111)
CO2 TOTAL: 29 mmol/L (ref 23–31)
CREATININE: 0.89 mg/dL (ref 0.60–1.05)
ESTIMATED GFR: 75 mL/min/BSA (ref >=60)
GLUCOSE: 146 mg/dL — ABNORMAL HIGH (ref 65–125)
POTASSIUM: 3.6 mmol/L (ref 3.5–5.1)
PROTEIN TOTAL: 7.5 g/dL (ref 6.0–8.0)
SODIUM: 142 mmol/L (ref 136–145)

## 2020-06-27 LAB — B-TYPE NATRIURETIC PEPTIDE (BNP),PLASMA: BNP: 43 pg/mL (ref <=99)

## 2020-06-27 LAB — URINALYSIS WITH MICROSCOPIC REFLEX IF INDICATED BMC/JMC ONLY
BILIRUBIN: NEGATIVE mg/dL
BLOOD: NEGATIVE mg/dL
GLUCOSE: NEGATIVE mg/dL
KETONES: NEGATIVE mg/dL
NITRITE: NEGATIVE
PH: 7.5 (ref <8.0)
PROTEIN: NEGATIVE mg/dL
SPECIFIC GRAVITY: 1.015 (ref <1.022)
UROBILINOGEN: 0.2 mg/dL (ref <=2.0)

## 2020-06-27 LAB — URINALYSIS, MICROSCOPIC

## 2020-06-27 LAB — COVID-19 ~~LOC~~ MOLECULAR LAB TESTING: SARS-CoV-2: NOT DETECTED

## 2020-06-27 LAB — TROPONIN-I: TROPONIN I: <7 ng/L — ABNORMAL LOW (ref 7–30)

## 2020-06-27 MED ORDER — LORAZEPAM 2 MG/ML INJECTION SOLUTION
2.0000 mg | INTRAMUSCULAR | Status: DC | PRN
Start: 2020-06-27 — End: 2020-07-08
  Administered 2020-06-28 – 2020-06-29 (×2): 2 mg via INTRAVENOUS
  Filled 2020-06-27 (×3): qty 1

## 2020-06-27 MED ORDER — GADOTERIDOL 279.3 MG/ML INTRAVENOUS SOLUTION
14.0000 mL | INTRAVENOUS | Status: AC
Start: 2020-06-27 — End: 2020-06-27

## 2020-06-27 MED ORDER — DEXAMETHASONE SODIUM PHOSPHATE 10 MG/ML INJECTION SOLUTION
10.0000 mg | INTRAMUSCULAR | Status: AC
Start: 2020-06-27 — End: 2020-06-27
  Administered 2020-06-27: 10 mg via INTRAVENOUS
  Filled 2020-06-27: qty 1

## 2020-06-27 MED ORDER — LEVETIRACETAM 1,000 MG/100 ML IN SODIUM CHLORIDE(ISO-OSM) IV PIGGYBACK
1000.0000 mg | INJECTION | INTRAVENOUS | Status: AC
Start: 2020-06-27 — End: 2020-06-27
  Administered 2020-06-27: 1000 mg via INTRAVENOUS
  Administered 2020-06-27: 0 mg via INTRAVENOUS
  Filled 2020-06-27: qty 100

## 2020-06-27 MED ORDER — DEXAMETHASONE SODIUM PHOSPHATE 10 MG/ML INJECTION SOLUTION
10.0000 mg | Freq: Four times a day (QID) | INTRAMUSCULAR | Status: DC
Start: 2020-06-28 — End: 2020-06-28
  Administered 2020-06-28: 10 mg via INTRAVENOUS
  Filled 2020-06-27 (×2): qty 1

## 2020-06-27 MED ORDER — LORAZEPAM 2 MG/ML INJECTION SOLUTION
INTRAMUSCULAR | Status: AC
Start: 2020-06-27 — End: 2020-06-27
  Administered 2020-06-27: 2 mg via INTRAVENOUS
  Filled 2020-06-27: qty 1

## 2020-06-27 MED ORDER — GADOTERIDOL 279.3 MG/ML INTRAVENOUS SOLUTION
INTRAVENOUS | Status: AC
Start: 2020-06-27 — End: 2020-06-27
  Filled 2020-06-27: qty 15

## 2020-06-27 NOTE — ED Nurses Note (Signed)
MRI called in.

## 2020-06-27 NOTE — ED Triage Notes (Signed)
Pt states she is feeling weak, having memory lapses, , feeling tired, decrease in appetite, can't sleep x 2 weeks. Seen PCP, had labs done this week. Sister with pt.

## 2020-06-27 NOTE — ED Provider Notes (Signed)
Beverley Fiedler, MD        [facility]          Emergency Department Visit Note    Date of Service: 06/27/2020  Room: ER02/02  Primary Care Doctor:Mehran Tempie Donning, MD    Chief Complaint:Weakness      HPI:  The patient is a(an) 74 y.o. female who presents to the Emergency Department with altered mental status, confusion, memory loss, forgetfulness.  The patient states that she has had a gradual worsening of her memory like she cannot remember her own brother and sisters names that she tends to forget things and that she was recently evaluated by her primary care physician and nothing was determined.  The patient has had multiple stressors and has not been sleeping at night due to care for her brother who has some medical issues.  She has lost a little bit of weight but has not had a headache or as she had weakness or abnormalities of her arms or legs.  When she stands she gets a little bit dizzy but she has not had any noticeable problem with ambulation.  She is okay to drive around town and get around town but she just feels like something is off.  One of the things that concerned her was that she recently purchased an item and she went to return the item with the receipt but not only could she not find the receipt she could not remember how she purchased it another words with a crit a [employer] with cash.    Review of Systems:  The pertinent positives and negatives are as per history of present illness. All other systems reviewed, unless otherwise noted are negative.     Past Medical History:  Past Medical History:   Diagnosis Date   . Breast mass, right     benign   . Cataract     bilat   . Family history of colon cancer    . Heartburn    . HTN (hypertension)    . Nausea with vomiting     sister with PONV   . Obesity    . Pulmonary emboli     only once in 1999 after her hysterectomy   . Wears glasses     reading           Past Surgical History:  Past Surgical History:   Procedure Laterality Date   .  58832 - INJ SINGLE TENDON SHEATH/LIGAMENT, APONEUROSIS W/ [place] INTERP ONLY (AMB ONLY)  02/10/2016   . HX BREAST BIOPSY Right     BENIGN    . HX CATARACT REMOVAL  10/2013    left   . HX HYSTERECTOMY  1999    fibroid   . HX ROTATOR CUFF REPAIR  11/04/2013    right shoulder           Social History:  Social History     Tobacco Use   . Smoking status: Never Smoker   . Smokeless tobacco: Never Used   Substance Use Topics   . Alcohol use: No     Alcohol/week: 0.0 standard drinks   . Drug use: No     Social History     Substance and Sexual Activity   Drug Use No       Current Outpatient Medications:   Outpatient Medications Marked as Taking for the 06/27/20 encounter [facility] Encounter)   Medication Sig Dispense Refill   . amLODIPine (NORVASC) 10 mg Oral Tablet TAKE  1 TABLET BY MOUTH EVERY DAY 90 Tablet 1   . aspirin (ECOTRIN) 81 mg Oral Tablet, Delayed Release (E.C.) Take 81 mg by mouth Once a day     . bisoprolol-hydroCHLOROthiazide (ZIAC) 10-6.25 mg Oral Tablet TAKE 1 TABLET BY MOUTH EVERY MORNING 90 Tablet 1   . calcium citrate-vitamin D3 (CITRACAL) 200 mg calcium -250 unit Oral Tablet Take 1 Tab by mouth Once a day     . Flaxseed Oil 1,000 mg Oral Capsule Take by mouth     . fluticasone (FLONASE) 50 mcg/actuation Nasal Spray, Suspension 1 Spray by Each Nostril route Twice per day as needed 1 Bottle 0   . metFORMIN (GLUCOPHAGE XR) 500 mg Oral Tablet Sustained Release 24 hr Take 1 Tablet (500 mg total) by mouth Once a day 90 Tablet 1   . multivitamin Oral Tablet Take 1 Tab by mouth Once a day     . timolol maleate (TIMOPTIC) 0.25 % Ophthalmic Drops Instill 1 Drop into right eye Twice daily          Allergies:   Allergies   Allergen Reactions   . Amoxicillin Rash       Physical Exam     Initial Vital Signs:  ED Triage Vitals [06/27/20 1120]   BP (Non-Invasive) (!) 148/71   Heart Rate 53   Respiratory Rate 16   Temperature 36.1 C (96.9 F)   SpO2 100 %   Weight 67.6 kg (149 lb)   Height 1.651 m (5\' 5" )       Constitutional:   Appearance is consistent with stated age above.  HEENT: Atraumatic, normocephalic head. Pupils equal and round, reactive to light. No scleral icterus. Normal conjunctiva. TM's are clear. Mucous membranes are moist. Nares unremarkable. Oropharynx shows no erythema or exudate.  Neck: No JVD. No thyromegaly. No lymphadenopathy. Supple.   Lungs: Clear to auscultation bilaterally.   Cardiovascular: Heart is S1-S2 regular rate without murmur, click, or rub.  Chest/Back/Musculoskeletal: No midline or paraspinal muscle tenderness to palpation. No step-off.   Abdomen:  Soft, non-distended. No tenderness to palpation without evidence of rebound or guarding. No pulsatile masses. No organomegaly.   Genitourinary: No CVA tenderness to palpation.  Extremities: No acute tenderness to palpation, deformity, or abnormality of ROM. No calf tenderness. No pitting edema.  Skin: Warm and dry. No cyanosis, jaundice, rash or lesion.  Neurologic: Alert and oriented x 3. Normal facial symmetry and speech. Normal upper and lower extremity strength. Grossly normal sensation. Cranial nerves II- VII intact. Finger to Nose: symmetrical and without dysmetria; No pronator drift; Normal Heel to Shin; No nystagmus; DTRs in UE and LE symmetrical and normal.  She had a negative Romberg and she was able to ambulate without difficulty heel to toe and yet she had some dizziness when she stood.  Her baseline pulse was 47 she has been known to be on the blood pressure medicines for years    Lymphatics: No lymphadenopathy.  Vascular: Normal peripheral pulses with brisk capillary refills of less than 2 seconds.    Diagnostics     Labs:    Results for orders placed or performed during the hospital encounter of 06/27/20 (from the past 24 hour(s))   CBC/DIFF    Narrative    The following orders were created for panel order CBC/DIFF.  Procedure  Abnormality         Status                     ---------                                -----------         ------                     CBC WITH ZCHY[[id number]]                Normal              Final result                 Please view results for these tests on the individual orders.   COMPREHENSIVE METABOLIC PANEL, NON-FASTING   Result Value Ref Range    SODIUM 142 136 - 145 mmol/L    POTASSIUM 3.6 3.5 - 5.1 mmol/L    CHLORIDE 103 96 - 111 mmol/L    CO2 TOTAL 29 23 - 31 mmol/L    ANION GAP 10 4 - 13 mmol/L    BUN 7 (L) 8 - 25 mg/dL    CREATININE 0.89 0.60 - 1.05 mg/dL    BUN/CREA RATIO 8 6 - 22    ESTIMATED GFR 75 >=60 mL/min/BSA    ALBUMIN 4.6 3.4 - 4.8 g/dL     CALCIUM 10.4 (H) 8.8 - 10.2 mg/dL    GLUCOSE 146 (H) 65 - 125 mg/dL    ALKALINE PHOSPHATASE 59 55 - 145 U/L    ALT (SGPT) 14 8 - 22 U/L    AST (SGOT)  21 8 - 45 U/L    BILIRUBIN TOTAL 0.8 0.3 - 1.3 mg/dL    PROTEIN TOTAL 7.5 6.0 - 8.0 g/dL   TROPONIN-I   Result Value Ref Range    TROPONIN I <7 (L) 7 - 30 ng/L   URINALYSIS WITH MICROSCOPIC REFLEX IF INDICATED [facility] ONLY   Result Value Ref Range    COLOR Light Yellow Light Yellow, Straw, Yellow    APPEARANCE Clear Clear    PH 7.5 <8.0    LEUKOCYTES Trace (A) Negative WBCs/uL    NITRITE Negative Negative    PROTEIN Negative Negative, 10  mg/dL    GLUCOSE Negative Negative mg/dL    KETONES Negative Negative mg/dL    UROBILINOGEN 0.2  <=2.0 mg/dL    BILIRUBIN Negative Negative mg/dL    BLOOD Negative Negative mg/dL    SPECIFIC GRAVITY 1.015 <1.022   CBC WITH DIFF   Result Value Ref Range    WBC 8.8 4.0 - 11.0 x10^3/uL    RBC 4.65 4.00 - 5.10 x10^6/uL    HGB 13.5 12.0 - 15.5 g/dL    HCT 39.5 36.0 - 45.0 %    MCV 85.0 82.0 - 97.0 fL    MCH 29.1 27.5 - 33.2 pg    MCHC 34.3 32.0 - 36.0 g/dL    RDW 14.2 11.0 - 16.0 %    PLATELETS 225 150 - 450 x10^3/uL    MPV 8.3 7.4 - 10.5 fL    NEUTROPHIL % 74 43 - 76 %    LYMPHOCYTE % 18 15 - 43 %    MONOCYTE % 7 5 - 12 %    EOSINOPHIL % 2 0 - 5 %    BASOPHIL % 1 0 -  3 %    NEUTROPHIL # 6.50 1.50 - 6.50 x10^3/uL    LYMPHOCYTE # 1.60 1.00 - 4.80 x10^3/uL     MONOCYTE # 0.60 0.20 - 0.90 x10^3/uL    EOSINOPHIL # 0.10 0.00 - 0.50 x10^3/uL    BASOPHIL # 0.10 0.00 - 0.10 x10^3/uL   B-TYPE NATRIURETIC PEPTIDE   Result Value Ref Range    BNP 43 <=99 pg/mL   URINALYSIS, MICROSCOPIC   Result Value Ref Range    RBCS 0-2 0 - 2 /hpf    WBCS 2-5 (A) 0 - 2 /hpf    BACTERIA Slight (A) None /hpf    SQUAMOUS EPITHELIAL 0-2 0 - 2 /hpf    MUCOUS Slight (A) None, Light /hpf    AMORPHOUS SEDIMENT Slight (A) None /hpf    RENAL EPITHELIAL 0-2 0 - 2 /hpf     Labs reviewed by me.    EKG interpretation:    Radiology:  XR AP MOBILE CHEST (If patient condition warrants)   Final Result      No acute radiographic findings.            Radiologist location ID: [id number]         CT BRAIN WO IV CONTRAST   Final Result   Abnormal   Area of hypoattenuation in the left temporal and parietal lobes   predominantly involving the white matter as described above. This is   concerning for vasogenic edema secondary to possible mass lesion in this   area. An evolving infarct is unlikely. Recommend MRI for further   evaluation.         Radiologist location ID: [id number]         MRI BRAIN W/WO CONTRAST    (Results Pending)     interpreted by radiologist and independently reviewed by me.      ED Course     The problem list, past medical, past surgical, medication and allergy history reviewed.      Initial orders placed:   Orders Placed This Encounter   . XR AP MOBILE CHEST (If patient condition warrants)   . CT BRAIN WO IV CONTRAST   . CANCELED: MRI BRAIN W/WO CONTRAST   . MRI BRAIN W/WO CONTRAST   . CBC/DIFF   . COMPREHENSIVE METABOLIC PANEL, NON-FASTING   . TROPONIN-I   . URINALYSIS WITH MICROSCOPIC REFLEX IF INDICATED [facility] ONLY   . CBC WITH DIFF   . B-TYPE NATRIURETIC PEPTIDE   . URINALYSIS, MICROSCOPIC   . ECG 12-LEAD (Take to provider with a brief history)   . gadoteridol (PROHANCE) 279.3 mg/mL injection   . LORazepam (ATIVAN) 2 mg/mL injection   . LORazepam (ATIVAN) 2 mg/mL injection ---Cabinet Override   .  Gadoteridol (PROHANCE) 279.3 mg/mL injection ---Roxanne Gates     After my exam I felt it was indicated to at least to perform a CT since the symptoms are vague.  The CT showed a possible lesion in the left temporal and parietal region and recommending MRI which has been ordered.       The patient was advised of the lesion as was her sister and she got a little bit tearful about going into an MRI I gave her 2 mg of IV Ativan and at this time 3:25 p.m. my colleague Dr. best will be reviewing the MRI results with her and possibly Dr. Ebony Hail the on-call neurosurgeon.       Pre-Disposition Vitals:  Vitals:    06/27/20 1120 06/27/20 1216  06/27/20 1217   BP: (!) 148/71 (!) 165/75 (!) 140/75   Pulse: 53 51 56   Resp: 16     Temp: 36.1 C (96.9 F)     SpO2: 100%  100%   Weight: 67.6 kg (149 lb)     Height: 1.651 m (5\' 5" )     BMI: 24.85             Impression:  1. Left parietal and temporal lobe lesion with MRI pending    Disposition:Data Unavailable pending      Prescriptions:  New Prescriptions    No medications on file       Follow Up:  No follow-up provider specified.    Milissa Fesperman Oswaldo Done, MD

## 2020-06-27 NOTE — ED Attending Note (Signed)
Carrie Blue, DO  [facility]  Emergency Department Visit Note    ED PROGRESS NOTE / [place]     1522: Care of this patient was transferred to me by Dr. Michael Boston (Emergency Medicine). Please see their note for a full H&P. The patient is awaiting results of the MRI for intracranial brain mass.     Study Result    Narrative & Impression   Carrie Escobar  Female, 74 years old.    MRI BRAIN W/WO CONTRAST performed on 06/27/2020 3:30 PM.    REASON FOR EXAM:  CT shows the suspicious for mass lesion in the left  temporal and parietal lobes      INTRAVENOUS CONTRAST: 14 ml's of Gadavist  CREATININE/GFR:    TECHNIQUE: Multiplanar multisequence MRI of the brain with and without  contrast    COMPARISON: Head CT from the same day    FINDINGS: Enhancing mass with central necrosis in the left parietal and  left temporal lobe including the mesial temporal lobe measuring 4.2 cm AP  dimension by 2.9 cm transverse dimension by 2.8 cm craniocaudal dimension.  The mass involves the deep and periventricular white matter and extends  along the ependymal lining of the left lateral ventricle involving the  trigone, temporal and occipital horn. Hyperperfusion is noted to the  peripheral enhancing portions of the mass. There is surrounding masslike  nonenhancing signal abnormality in the left parietal, temporal and  occipital lobes as well as extending across the splenium of the corpus  callosum. An additional lesion just posterior to the above-mentioned lesion  connected by the abnormal white matter signal to the larger mass measuring  1.1 cm x 0.6 cm is seen. There is also a separate left occipital mass  measuring 9 mm x 10 mm, which is also connected to the above-mentioned  lesions by T2/FLAIR signal abnormality. Some effacement of the trigone and  temporal horns of the left lateral ventricle without significant midline  shift. No associated hemorrhage.    A small dural based mass measuring 8 mm x 4  mm arising from the left aspect  of the anterior falx, favored to represent a meningioma.    No acute infarct.       IMPRESSION:    Multiple enhancing masses in the left parietal, temporal and occipital  lobes which are connected by masslike T2/FLAIR signal abnormality which  also extends into the splenium of the corpus callosum. 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 The largest mass extends along the  ependymal lining of the left lateral ventricle.    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      Radiologist location ID: [id number]     3244: Dr. Ebony Hail paged.  Recommended Decadron and Keppra.  He requested that the patient be admitted and have a CT of the chest abdomen and pelvis performed.    1701: The patient and her family members are updated regarding the need for admission and treatment plan.  We will admit the patient for a biopsy.  The hospitalist service is paged.    1710: Dr. Jobie Quaker agreed to admit the patient for further evaluation and management.  He deferred treating the possible urinary tract infection.  He understood the need for PET-CT and ICU admission.    Pre-Disposition Vitals:  Filed Vitals:    06/27/20 1120 06/27/20 1216 06/27/20 1217   BP: (!) 148/71 (!) 165/75 (!) 140/75   Pulse: 53 51 56  Resp: 16     Temp: 36.1 C (96.9 F)     SpO2: 100%  100%       CLINICAL IMPRESSION     1. Brain mass    DISPOSITION/PLAN     Admit    Condition on Disposition: Stable

## 2020-06-27 NOTE — ED Nurses Note (Addendum)
Report given to ...Chuck RN........ This nurse has been given the opportunity to ask questions and voice any requests and/or concerns.

## 2020-06-27 NOTE — ACP (Advance Care Planning) (Signed)
Goals of Care  ([place]) Documentation    Purpose of Encounter: evaluationdiscussion    Parties in Attendance: Patient and Family (sister, [name])    Patient's Decisional Capacity (Name of surrogate if no capacity)  lacks decision making capacity.  It is recommended that the patient have a Medical Surrogate appointed if they currently lack a Medical Power of Attorney     Subjective/Patient Story:    Memory issues  Weakness    Objective/Medical Story:    Intracranial masses:  Multi focal lesions. Concerning for glioblastoma. Associated with weakness, memory impairment and lethargy. Will admit to ICU for further evaluation. PET scan per Neurosurgery recommendations. Patient will need biopsy of 1 of these masses for tissue diagnosis. Will start on prophylactic Keppra IV. Neuro checks. Will consult Neurology as well as Neurosurgery. Falls, seizure, and aspiration precautions. Supportive care as needed.      Memory impairment: Subacute. Ongoing for several weeks. Worsening. In the due to above diagnosis. Treat as outlined above. Further evaluation diagnosis as outlined above.      Lethargy: Subacute.  May be emanating from above diagnosis.  Treat as outlined above. PT OT evaluation and treatment.  Fall precautions.      Acute metabolic encephalopathy:  Mild to moderate.  Likely due to 1. Diagnosis.  Treat as outlined above.  Neuro checks.  Supportive care.        Hypertension: Benign. Shy of goal. Resume oral regimen. Adjust as needed.       Diabetes mellitus: Type 2. Patient on Metformin. Will continue same. Sliding scale insulin coverage as needed. Fingersticks.            Goals of Care Determinations:     I discussed end of life care goals and wishes with patient at bedside. I discussed CPR in terms of chest compression, defibrillation and IV medicines. I discussed mechanical ventilation interms of being placed on a respirator or other life-saving devices and machines. I discussed what these procedures may entail and  scenarios whereany of these may be needed including in the setting of cardiopulmonary arrest, unremitting seizures. Patient considered all aforementioned options and elects a full code status at this time.      Plan:  Patient Active Problem List   Diagnosis   . Shoulder pain, right   . Lipoma of shoulder   . Rotator cuff syndrome of right shoulder   . Senile nuclear sclerosis   . Routine general medical examination at a health care facility   . Dependent edema   . Atypical chest pain   . Trigger finger of right hand   . Brain mass   . Memory impairment   . Acute metabolic encephalopathy         Code Status:  Code Status Information     Code Status Advance Care Planning    Not on file Jump to the Activity          Other documents completed (e.g. Advance directives, POLST, MOLST):  no    Time spent discussing advance care planning (ACP) - required if submitting a charge for an ACP discussion: 19 minutes.

## 2020-06-27 NOTE — ED Nurses Note (Signed)
Orthostatic Vital Signs:    Lying: Blood pressure (  165/75           ) pulse (  51     )  Standing: Blood pressure ( 140/75        ) pulse  (56      )    Patient reports (        "wobbly"                    ) when standing from a supine position.

## 2020-06-27 NOTE — Progress Notes (Signed)
[facility]  [place], [place] [address]    INPATIENT PROGRESS NOTE      [place], [place]  Date of Admission:  06/27/2020  Date of Birth:  03-29-1946  Date of Service:  06/28/2020         Assessment/ Plan:       Intracranial masses:  Multi focal lesions. Concerning for glioblastoma. Associated with weakness, memory impairment and lethargy.  Continue to monitor in ICU.  Follow PET results. Patient will need biopsy of 1 of these masses for tissue diagnosis. Continue on prophylactic Keppra IV. Continue Decadron. Continue  Neuro checks.  Awaiting recommendation from Neurology as well as Neurosurgery. Falls, seizure, and aspiration precautions. Supportive care as needed.      Memory impairment: Subacute. Ongoing for several weeks. Worsening.  Likely due to above diagnosis. Treat as outlined above. Further evaluation diagnosis as outlined above.      Lethargy: Subacute.  May be emanating from above diagnosis.  Treat as outlined above. PT OT evaluation and treatment.  Fall precautions.      Acute metabolic encephalopathy:  Mild to moderate.  Likely due to #1 diagnosis.  Treat as outlined above.  Neuro checks.  Supportive care.        Hypertension: Benign. Shy of goal. Resume oral regimen. Adjust as needed.       Diabetes mellitus: Type 2. Patient on Metformin. Will continue same. Sliding scale insulin coverage as needed. Fingersticks.        Prophylaxis:  SCD      Disposition:  Home when workup and treatment are completed.          Chief Complaint:  And a full vest printed.  Masses, sleepiness, forgetfulness, fatigue.      Subjective: Pt seen. See Above.      ROS: Denies fever/chills. No chest pain/SOB. No abdominal pain, nausea, or vomiting.         Today's Physical Exam:  BP 130/65   Pulse 64   Temp 36.9 C (98.5 F)   Resp 18   Ht 1.651 m (5\' 5" )   Wt 65 kg (143 lb 4.8 oz)   SpO2 97%   BMI 23.85 kg/m       General: AAOx3, Memory recall impediment, No acute distress.   Eyes:  Pupils equal and round, reactive to light and accomodation.   HENT:Head atraumatic and normocephalic   Neck: No JVD or thyromegaly or lymphadenopathy   Lungs: Clear to auscultation bilaterally.   Cardiovascular: regular rate and rhythm, S1, S2 normal, no murmur,   Abdomen: Soft, non-tender, Bowel sounds normal, No hepatosplenomegaly   Extremities: extremities normal, atraumatic, no cyanosis or edema   Skin: Skin warm and dry   Neurologic: Unable to recall events  Lymphatics: No lymphadenopathy   Psychiatric: Normal affect and behavior.    Current Medications:  amLODIPine (NORVASC) tablet, 10 mg, Oral, Daily  cefTRIAXone (ROCEPHIN) 2 g in NS 50 mL IVPB minibag, 2 g, Intravenous, Q24H  dexamethasone 10 mg/mL injection, 10 mg, Intravenous, Q6H  fluticasone (FLONASE) 50 mcg per spray nasal spray, 1 Spray, Each Nostril, Daily  levETIRAcetam (KEPPRA) 1000 mg in iso-osmotic 100 mL premix IVPB, 1,000 mg, Intravenous, Q12H  LORazepam (ATIVAN) 2 mg/mL injection, 2 mg, Intravenous, Q4H PRN  metFORMIN (GLUCOPHAGE XR) extended release tablet, 500 mg, Oral, Daily  timolol (TIMOPTIC) 0.25% ophthalmic solution, 1 Drop, Right Eye, 2x/day          I/O:  I/O last 24 hours:  Intake/Output Summary (Last 24 hours) at 06/28/2020 0804  Last data filed at 06/28/2020 0700  Gross per 24 hour   Intake 100 ml   Output -   Net 100 ml     I/O current shift:  09/19 0700 - 09/19 1859  In: 100 [I.V.:100]  Out: -       Laboratory Results:    Reviewed:   Results for orders placed or performed during the hospital encounter of 06/27/20 (from the past 24 hour(s))   COMPREHENSIVE METABOLIC PANEL, NON-FASTING   Result Value Ref Range    SODIUM 142 136 - 145 mmol/L    POTASSIUM 3.6 3.5 - 5.1 mmol/L    CHLORIDE 103 96 - 111 mmol/L    CO2 TOTAL 29 23 - 31 mmol/L    ANION GAP 10 4 - 13 mmol/L    BUN 7 (L) 8 - 25 mg/dL    CREATININE 0.89 0.60 - 1.05 mg/dL    BUN/CREA RATIO 8 6 - 22    ESTIMATED GFR 75 >=60 mL/min/BSA    ALBUMIN 4.6 3.4 - 4.8 g/dL     CALCIUM  10.4 (H) 8.8 - 10.2 mg/dL    GLUCOSE 146 (H) 65 - 125 mg/dL    ALKALINE PHOSPHATASE 59 55 - 145 U/L    ALT (SGPT) 14 8 - 22 U/L    AST (SGOT)  21 8 - 45 U/L    BILIRUBIN TOTAL 0.8 0.3 - 1.3 mg/dL    PROTEIN TOTAL 7.5 6.0 - 8.0 g/dL   CBC WITH DIFF   Result Value Ref Range    WBC 8.8 4.0 - 11.0 x10^3/uL    RBC 4.65 4.00 - 5.10 x10^6/uL    HGB 13.5 12.0 - 15.5 g/dL    HCT 39.5 36.0 - 45.0 %    MCV 85.0 82.0 - 97.0 fL    MCH 29.1 27.5 - 33.2 pg    MCHC 34.3 32.0 - 36.0 g/dL    RDW 14.2 11.0 - 16.0 %    PLATELETS 225 150 - 450 x10^3/uL    MPV 8.3 7.4 - 10.5 fL    NEUTROPHIL % 74 43 - 76 %    LYMPHOCYTE % 18 15 - 43 %    MONOCYTE % 7 5 - 12 %    EOSINOPHIL % 2 0 - 5 %    BASOPHIL % 1 0 - 3 %    NEUTROPHIL # 6.50 1.50 - 6.50 x10^3/uL    LYMPHOCYTE # 1.60 1.00 - 4.80 x10^3/uL    MONOCYTE # 0.60 0.20 - 0.90 x10^3/uL    EOSINOPHIL # 0.10 0.00 - 0.50 x10^3/uL    BASOPHIL # 0.10 0.00 - 0.10 x10^3/uL   B-TYPE NATRIURETIC PEPTIDE   Result Value Ref Range    BNP 43 <=99 pg/mL   TROPONIN-I   Result Value Ref Range    TROPONIN I <7 (L) 7 - 30 ng/L   URINALYSIS WITH MICROSCOPIC REFLEX IF INDICATED [facility] ONLY   Result Value Ref Range    COLOR Light Yellow Light Yellow, Straw, Yellow    APPEARANCE Clear Clear    PH 7.5 <8.0    LEUKOCYTES Trace (A) Negative WBCs/uL    NITRITE Negative Negative    PROTEIN Negative Negative, 10  mg/dL    GLUCOSE Negative Negative mg/dL    KETONES Negative Negative mg/dL    UROBILINOGEN 0.2  <=2.0 mg/dL    BILIRUBIN Negative Negative mg/dL    BLOOD Negative Negative mg/dL  SPECIFIC GRAVITY 1.015 <1.022   URINALYSIS, MICROSCOPIC   Result Value Ref Range    RBCS 0-2 0 - 2 /hpf    WBCS 2-5 (A) 0 - 2 /hpf    BACTERIA Slight (A) None /hpf    SQUAMOUS EPITHELIAL 0-2 0 - 2 /hpf    MUCOUS Slight (A) None, Light /hpf    AMORPHOUS SEDIMENT Slight (A) None /hpf    RENAL EPITHELIAL 0-2 0 - 2 /hpf   COVID-19 SCREENING (COVID only)   Result Value Ref Range    SARS-CoV-2 Not Detected Not Detected   POC FINGERSTICK  GLUCOSE - [facility] (RESULTS)   Result Value Ref Range    GLUCOSE, POC 134 (H) 60 - 100 mg/dl     Ordered:    Lab orders   No lab orders         Radiological Studies:  Reviewed:   Results for orders placed or performed during the hospital encounter of 06/27/20 (from the past 24 hour(s))   CT BRAIN WO IV CONTRAST     Status: Abnormal    Narrative    Carrie Escobar  Female, 75 years old.    CT BRAIN WO IV CONTRAST performed on 06/27/2020 12:21 PM.    REASON FOR EXAM:  AMS,    RADIATION DOSE: 1221.00 mGy.cm    TECHNIQUE: Multiplanar nonenhanced images of the brain    COMPARISON: None available    FINDINGS:  Area of hypoattenuation left temporal and parietal lobes  predominantly involving the white matter. There may also be abnormal  hypoattenuation involving the splenium of the corpus callosum. No  associated hemorrhage. Mild effacement of the trigone of the left lateral  ventricle without significant midline shift. No hydrocephalus. No  extra-axial fluid collection.      Impression    Area of hypoattenuation in the left temporal and parietal lobes  predominantly involving the white matter as described above. This is  concerning for vasogenic edema secondary to possible mass lesion in this  area. An evolving infarct is unlikely. Recommend MRI for further  evaluation.      Radiologist location ID: [id number]     XR AP MOBILE CHEST (If patient condition warrants)     Status: None    Narrative    RADIOLOGIST: Lars Masson, MD    EXAMINATION: XR AP MOBILE CHEST     EXAM DATE/TIME: 06/27/2020 12:46 PM    CLINICAL INDICATION: Chest pain    COMPARISON: None.    FINDINGS:     Cardiovascular:   The cardiac silhouette is within normal limits.    Lungs:   There are no infiltrates or pulmonary edema.    Pleural spaces:   There are no pleural effusions on either side.    Chest wall:   No acute findings.      Impression    No acute radiographic findings.        Radiologist location ID: [id number]     MRI BRAIN W/WO CONTRAST      Status: None    Narrative    Carrie Escobar  Female, 74 years old.    MRI BRAIN W/WO CONTRAST performed on 06/27/2020 3:30 PM.    REASON FOR EXAM:  CT shows the suspicious for mass lesion in the left  temporal and parietal lobes      INTRAVENOUS CONTRAST: 14 ml's of Gadavist  CREATININE/GFR:    TECHNIQUE: Multiplanar multisequence MRI of the brain with and without  contrast    COMPARISON:  Head CT from the same day    FINDINGS: Enhancing mass with central necrosis in the left parietal and  left temporal lobe including the mesial temporal lobe measuring 4.2 cm AP  dimension by 2.9 cm transverse dimension by 2.8 cm craniocaudal dimension.  The mass involves the deep and periventricular white matter and extends  along the ependymal lining of the left lateral ventricle involving the  trigone, temporal and occipital horn. Hyperperfusion is noted to the  peripheral enhancing portions of the mass. There is surrounding masslike  nonenhancing signal abnormality in the left parietal, temporal and  occipital lobes as well as extending across the splenium of the corpus  callosum. An additional lesion just posterior to the above-mentioned lesion  connected by the abnormal white matter signal to the larger mass measuring  1.1 cm x 0.6 cm is seen. There is also a separate left occipital mass  measuring 9 mm x 10 mm, which is also connected to the above-mentioned  lesions by T2/FLAIR signal abnormality. Some effacement of the trigone and  temporal horns of the left lateral ventricle without significant midline  shift. No associated hemorrhage.    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    No acute infarct.         Impression    Multiple enhancing masses in the left parietal, temporal and occipital  lobes which are connected by masslike T2/FLAIR signal abnormality which  also extends into the splenium of the corpus callosum. 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 The largest mass extends along the  ependymal lining of the left lateral ventricle.    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      Radiologist location ID: [id number]       Ordered:  CT BRAIN WO IV CONTRAST  XR AP MOBILE CHEST  MRI BRAIN W/WO CONTRAST  PET CT INITIAL STAGING: BODY (HEAD TO THIGH) WO IV CONTRAST    Problem List:  Active Hospital Problems   (*Primary Problem)    Diagnosis   . *Brain mass   . Memory impairment   . Acute metabolic encephalopathy         Peter Garter, MD, 06/28/2020,  08:04

## 2020-06-27 NOTE — ED Nurses Note (Signed)
Blood work obtained, specimen labeled at bedside, and then shown to patient and family for label verification. Specimen then sent to main lab.

## 2020-06-27 NOTE — H&P (Signed)
[facility]  [place], [place] [address]    Inpatient History and Physical    Carrie Escobar, Carrie Escobar  Date of Admission:  06/27/2020  Date of Birth:  [birth year]    PCP: Kayleen Memos, MD       Chief Complaint:  "I feel tired, sleepy also time and very forgetful."        Assessment/Plan:       Intracranial masses:  Multi focal lesions. Concerning for glioblastoma. Associated with weakness, memory impairment and lethargy. Will admit to ICU for further evaluation. PET scan per Neurosurgery recommendations. Patient will need biopsy of 1 of these masses for tissue diagnosis. Will start on prophylactic Keppra IV. Neuro checks. Will consult Neurology as well as Neurosurgery. Falls, seizure, and aspiration precautions. Supportive care as needed.      Memory impairment: Subacute. Ongoing for several weeks. Worsening. In the due to above diagnosis. Treat as outlined above. Further evaluation diagnosis as outlined above.      Lethargy: Subacute.  May be emanating from above diagnosis.  Treat as outlined above. PT OT evaluation and treatment.  Fall precautions.      Acute metabolic encephalopathy:  Mild to moderate.  Likely due to 1. Diagnosis.  Treat as outlined above.  Neuro checks.  Supportive care.        Hypertension: Benign. Shy of goal. Resume oral regimen. Adjust as needed.       Diabetes mellitus: Type 2. Patient on Metformin. Will continue same. Sliding scale insulin coverage as needed. Fingersticks.        Prophylaxis:  SCDs.        Disposition: Home when medical recovered and diagnosis in hand.        Peter Garter, MD, 06/27/2020,  23:16          HPI: Carrie Escobar is a 74 y.o., 24 American female who presents with tiredness, sleepiness and forgetfulness.  Onset, over last couple of weeks.  Patient was seen by her PCP last week and blood work was sent out.  She does not know the results of the blood work as of now.  She reports feeling dizzy today. Sh describes  this as being off balance and lacking equilibrium.  She reports no associated nausea or vomiting.  She denies any vision changes.  She reports no headaches.  She denies any recent falls.  She reports no fevers or chills.  She denies any chest pain or shortness of breath. Patient denies any history of seizures.  She reports being up-to-date on a cancer screening including colonoscopy which was 5 days ago and mammogram was in January of this year.       Past Medical History:   Diagnosis Date   . Breast mass, right     benign   . Cataract     bilat   . Family history of colon cancer    . Heartburn    . HTN (hypertension)    . Nausea with vomiting     sister with PONV   . Obesity    . Pulmonary emboli     only once in 1999 after her hysterectomy   . Wears glasses     reading           Past Surgical History:   Procedure Laterality Date   . 36629 - INJ SINGLE TENDON SHEATH/LIGAMENT, APONEUROSIS W/ [place] INTERP ONLY (AMB ONLY)  02/10/2016   . HX BREAST BIOPSY Right  BENIGN    . HX CATARACT REMOVAL  10/2013    left   . HX HYSTERECTOMY  1999    fibroid   . HX ROTATOR CUFF REPAIR  11/04/2013    right shoulder           Medications Prior to Admission     Prescriptions    amLODIPine (NORVASC) 10 mg Oral Tablet    TAKE 1 TABLET BY MOUTH EVERY DAY    aspirin (ECOTRIN) 81 mg Oral Tablet, Delayed Release (E.C.)    Take 81 mg by mouth Once a day    bisoprolol-hydroCHLOROthiazide (ZIAC) 10-6.25 mg Oral Tablet    TAKE 1 TABLET BY MOUTH EVERY MORNING    calcium citrate-vitamin D3 (CITRACAL) 200 mg calcium -250 unit Oral Tablet    Take 1 Tab by mouth Once a day    Flaxseed Oil 1,000 mg Oral Capsule    Take by mouth    fluticasone (FLONASE) 50 mcg/actuation Nasal Spray, Suspension    1 Spray by Each Nostril route Twice per day as needed    metFORMIN (GLUCOPHAGE XR) 500 mg Oral Tablet Sustained Release 24 hr    Take 1 Tablet (500 mg total) by mouth Once a day    multivitamin Oral Tablet    Take 1 Tab by mouth Once a day    timolol maleate  (TIMOPTIC) 0.25 % Ophthalmic Drops    Instill 1 Drop into right eye Twice daily        LORazepam (ATIVAN) 2 mg/mL injection, 2 mg, Intravenous, Q4H PRN        Allergies   Allergen Reactions   . Amoxicillin Rash       Social History     Tobacco Use   . Smoking status: Never Smoker   . Smokeless tobacco: Never Used   Substance Use Topics   . Alcohol use: No     Alcohol/week: 0.0 standard drinks       Family Medical History:     Problem Relation (Age of Onset)    Breast Cancer Maternal Grandmother    Cancer Mother, Sister    Heart Attack Sister    Hypertension (High Blood Pressure) Mother    No Known Problems Brother, Maternal Grandfather, Paternal Grandmother, Paternal Grandfather, Daughter, Son, Maternal 26, Maternal Uncle, Paternal 76, Paternal Uncle, Other    Stroke Mother, Father              ROS:   Constitutional: positive for fatigue, malaise and anorexia  Eyes: negative  Ears, nose, mouth, throat, and face: negative  Respiratory: negative  Cardiovascular: negative  Gastrointestinal: negative  Genitourinary:negative  Integument/breast: negative  Hematologic/lymphatic: negative  Musculoskeletal:positive for muscle weakness  Neurological: positive for dizziness and memory problems  Behavioral/Psych: negative  Endocrine: negative  Allergic/Immunologic: negative    10 systems are reviewed.    DNR Status:  No Order    EXAM:  Temperature: 36.1 C (96.9 F)  Heart Rate: 53  BP (Non-Invasive): (!) 144/79  Respiratory Rate: 19  SpO2: 93 %  General: AAOx3, Unable to recall recent events, No acute distress.   Eyes: Pupils equal and round, reactive to light and accomodation.   HEENT: Head atraumatic and normocephalic   Neck: No JVD or thyromegaly or lymphadenopathy   Lungs: Clear to auscultation bilaterally.   Cardiovascular: regular rate and rhythm, S1, S2 normal, no murmur  Abdomen: Soft, non-tender, Bowel sounds normal, No hepatosplenomegaly   Extremities: extremities normal, atraumatic, no cyanosis or edema  Skin:  Skin warm and dry   Neurologic: Unable to recall recent events.   Lymphatics: No lymphadenopathy   Psychiatric: Normal affect and behavior.    Labs:    Lab Results for Last 24 Hours:    Results for orders placed or performed during the hospital encounter of 06/27/20 (from the past 24 hour(s))   COMPREHENSIVE METABOLIC PANEL, NON-FASTING   Result Value Ref Range    SODIUM 142 136 - 145 mmol/L    POTASSIUM 3.6 3.5 - 5.1 mmol/L    CHLORIDE 103 96 - 111 mmol/L    CO2 TOTAL 29 23 - 31 mmol/L    ANION GAP 10 4 - 13 mmol/L    BUN 7 (L) 8 - 25 mg/dL    CREATININE 0.89 0.60 - 1.05 mg/dL    BUN/CREA RATIO 8 6 - 22    ESTIMATED GFR 75 >=60 mL/min/BSA    ALBUMIN 4.6 3.4 - 4.8 g/dL     CALCIUM 10.4 (H) 8.8 - 10.2 mg/dL    GLUCOSE 146 (H) 65 - 125 mg/dL    ALKALINE PHOSPHATASE 59 55 - 145 U/L    ALT (SGPT) 14 8 - 22 U/L    AST (SGOT)  21 8 - 45 U/L    BILIRUBIN TOTAL 0.8 0.3 - 1.3 mg/dL    PROTEIN TOTAL 7.5 6.0 - 8.0 g/dL   CBC WITH DIFF   Result Value Ref Range    WBC 8.8 4.0 - 11.0 x10^3/uL    RBC 4.65 4.00 - 5.10 x10^6/uL    HGB 13.5 12.0 - 15.5 g/dL    HCT 39.5 36.0 - 45.0 %    MCV 85.0 82.0 - 97.0 fL    MCH 29.1 27.5 - 33.2 pg    MCHC 34.3 32.0 - 36.0 g/dL    RDW 14.2 11.0 - 16.0 %    PLATELETS 225 150 - 450 x10^3/uL    MPV 8.3 7.4 - 10.5 fL    NEUTROPHIL % 74 43 - 76 %    LYMPHOCYTE % 18 15 - 43 %    MONOCYTE % 7 5 - 12 %    EOSINOPHIL % 2 0 - 5 %    BASOPHIL % 1 0 - 3 %    NEUTROPHIL # 6.50 1.50 - 6.50 x10^3/uL    LYMPHOCYTE # 1.60 1.00 - 4.80 x10^3/uL    MONOCYTE # 0.60 0.20 - 0.90 x10^3/uL    EOSINOPHIL # 0.10 0.00 - 0.50 x10^3/uL    BASOPHIL # 0.10 0.00 - 0.10 x10^3/uL   B-TYPE NATRIURETIC PEPTIDE   Result Value Ref Range    BNP 43 <=99 pg/mL   TROPONIN-I   Result Value Ref Range    TROPONIN I <7 (L) 7 - 30 ng/L   URINALYSIS WITH MICROSCOPIC REFLEX IF INDICATED [facility] ONLY   Result Value Ref Range    COLOR Light Yellow Light Yellow, Straw, Yellow    APPEARANCE Clear Clear    PH 7.5 <8.0    LEUKOCYTES Trace (A) Negative  WBCs/uL    NITRITE Negative Negative    PROTEIN Negative Negative, 10  mg/dL    GLUCOSE Negative Negative mg/dL    KETONES Negative Negative mg/dL    UROBILINOGEN 0.2  <=2.0 mg/dL    BILIRUBIN Negative Negative mg/dL    BLOOD Negative Negative mg/dL    SPECIFIC GRAVITY 1.015 <1.022   URINALYSIS, MICROSCOPIC   Result Value Ref Range    RBCS 0-2 0 - 2 /hpf  WBCS 2-5 (A) 0 - 2 /hpf    BACTERIA Slight (A) None /hpf    SQUAMOUS EPITHELIAL 0-2 0 - 2 /hpf    MUCOUS Slight (A) None, Light /hpf    AMORPHOUS SEDIMENT Slight (A) None /hpf    RENAL EPITHELIAL 0-2 0 - 2 /hpf   COVID-19 SCREENING (COVID only)   Result Value Ref Range    SARS-CoV-2 Not Detected Not Detected       Imaging Studies:   Results for orders placed or performed during the hospital encounter of 06/27/20   CT BRAIN WO IV CONTRAST     Status: Abnormal    Narrative    [name]  Female, 74 years old.    CT BRAIN WO IV CONTRAST performed on 06/27/2020 12:21 PM.    REASON FOR EXAM:  AMS,    RADIATION DOSE: 1221.00 mGy.cm    TECHNIQUE: Multiplanar nonenhanced images of the brain    COMPARISON: None available    FINDINGS:  Area of hypoattenuation left temporal and parietal lobes  predominantly involving the white matter. There may also be abnormal  hypoattenuation involving the splenium of the corpus callosum. No  associated hemorrhage. Mild effacement of the trigone of the left lateral  ventricle without significant midline shift. No hydrocephalus. No  extra-axial fluid collection.      Impression    Area of hypoattenuation in the left temporal and parietal lobes  predominantly involving the white matter as described above. This is  concerning for vasogenic edema secondary to possible mass lesion in this  area. An evolving infarct is unlikely. Recommend MRI for further  evaluation.      Radiologist location ID: [id number]     XR AP MOBILE CHEST (If patient condition warrants)     Status: None    Narrative    RADIOLOGIST: Lars Masson,  MD    EXAMINATION: XR AP MOBILE CHEST     EXAM DATE/TIME: 06/27/2020 12:46 PM    CLINICAL INDICATION: Chest pain    COMPARISON: None.    FINDINGS:     Cardiovascular:   The cardiac silhouette is within normal limits.    Lungs:   There are no infiltrates or pulmonary edema.    Pleural spaces:   There are no pleural effusions on either side.    Chest wall:   No acute findings.      Impression    No acute radiographic findings.        Radiologist location ID: [id number]     MRI BRAIN W/WO CONTRAST     Status: None    Narrative    [name]  Female, 74 years old.    MRI BRAIN W/WO CONTRAST performed on 06/27/2020 3:30 PM.    REASON FOR EXAM:  CT shows the suspicious for mass lesion in the left  temporal and parietal lobes      INTRAVENOUS CONTRAST: 14 ml's of Gadavist  CREATININE/GFR:    TECHNIQUE: Multiplanar multisequence MRI of the brain with and without  contrast    COMPARISON: Head CT from the same day    FINDINGS: Enhancing mass with central necrosis in the left parietal and  left temporal lobe including the mesial temporal lobe measuring 4.2 cm AP  dimension by 2.9 cm transverse dimension by 2.8 cm craniocaudal dimension.  The mass involves the deep and periventricular white matter and extends  along the ependymal lining of the left lateral ventricle involving the  trigone, temporal and occipital horn. Hyperperfusion  is noted to the  peripheral enhancing portions of the mass. There is surrounding masslike  nonenhancing signal abnormality in the left parietal, temporal and  occipital lobes as well as extending across the splenium of the corpus  callosum. An additional lesion just posterior to the above-mentioned lesion  connected by the abnormal white matter signal to the larger mass measuring  1.1 cm x 0.6 cm is seen. There is also a separate left occipital mass  measuring 9 mm x 10 mm, which is also connected to the above-mentioned  lesions by T2/FLAIR signal abnormality. Some effacement of the trigone  and  temporal horns of the left lateral ventricle without significant midline  shift. No associated hemorrhage.    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    No acute infarct.         Impression    Multiple enhancing masses in the left parietal, temporal and occipital  lobes which are connected by masslike T2/FLAIR signal abnormality which  also extends into the splenium of the corpus callosum. 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 The largest mass extends along the  ependymal lining of the left lateral ventricle.    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      Radiologist location ID: [id number]         DVT RISK FACTORS HAVE BEN ASSESSED AND PROPHYLAXIS ORDERED (SEE RUBYONLINE - REFERENCE TOOLS - MD, DVT PROPHY OR POCKET CARD)    Patient Active Problem List    Diagnosis Date Noted   . Brain mass 06/27/2020   . Memory impairment 06/27/2020   . Acute metabolic encephalopathy 94/76/5465   . Trigger finger of right hand 02/10/2016   . Atypical chest pain 12/04/2014   . Dependent edema 04/08/2014   . Routine general medical examination at a health care facility 01/15/2014   . Senile nuclear sclerosis 10/03/2013   . Rotator cuff syndrome of right shoulder 08/11/2013   . Shoulder pain, right 02/26/2013   . Lipoma of shoulder 02/26/2013

## 2020-06-28 ENCOUNTER — Encounter (HOSPITAL_COMMUNITY): Payer: Self-pay | Admitting: HOSPITALIST

## 2020-06-28 ENCOUNTER — Inpatient Hospital Stay (HOSPITAL_COMMUNITY): Payer: Medicare PPO

## 2020-06-28 DIAGNOSIS — R413 Other amnesia: Secondary | ICD-10-CM

## 2020-06-28 DIAGNOSIS — R410 Disorientation, unspecified: Secondary | ICD-10-CM

## 2020-06-28 DIAGNOSIS — Z86711 Personal history of pulmonary embolism: Secondary | ICD-10-CM

## 2020-06-28 DIAGNOSIS — G9389 Other specified disorders of brain: Secondary | ICD-10-CM

## 2020-06-28 DIAGNOSIS — G9341 Metabolic encephalopathy: Secondary | ICD-10-CM

## 2020-06-28 LAB — POC FINGERSTICK GLUCOSE - BMC/JMC (RESULTS)
GLUCOSE, POC: 134 mg/dL — ABNORMAL HIGH (ref 60–100)
GLUCOSE, POC: 182 mg/dL — ABNORMAL HIGH (ref 60–100)
GLUCOSE, POC: 177 mg/dL — ABNORMAL HIGH (ref 60–100)
GLUCOSE, POC: 164 mg/dL — ABNORMAL HIGH (ref 60–100)

## 2020-06-28 MED ORDER — LEVETIRACETAM 1,000 MG/100 ML IN SODIUM CHLORIDE(ISO-OSM) IV PIGGYBACK
1000.0000 mg | INJECTION | Freq: Two times a day (BID) | INTRAVENOUS | Status: DC
Start: 2020-06-28 — End: 2020-07-02
  Administered 2020-06-28: 0 mg via INTRAVENOUS
  Administered 2020-06-28: 1000 mg via INTRAVENOUS
  Administered 2020-06-28: 0 mg via INTRAVENOUS
  Administered 2020-06-28 – 2020-06-29 (×3): 1000 mg via INTRAVENOUS
  Administered 2020-06-29 (×2): 0 mg via INTRAVENOUS
  Administered 2020-06-30 (×2): 1000 mg via INTRAVENOUS
  Administered 2020-06-30 (×2): 0 mg via INTRAVENOUS
  Administered 2020-07-01: 1000 mg via INTRAVENOUS
  Administered 2020-07-01 (×2): 0 mg via INTRAVENOUS
  Administered 2020-07-01: 1000 mg via INTRAVENOUS
  Administered 2020-07-02: 0 mg via INTRAVENOUS
  Administered 2020-07-02: 1000 mg via INTRAVENOUS
  Filled 2020-06-28 (×11): qty 100

## 2020-06-28 MED ORDER — METFORMIN ER 500 MG TABLET,EXTENDED RELEASE 24 HR
500.0000 mg | ORAL_TABLET | Freq: Every day | ORAL | Status: DC
Start: 2020-06-28 — End: 2020-07-08
  Administered 2020-06-28 – 2020-07-02 (×2): 0 mg via ORAL
  Administered 2020-07-03 – 2020-07-08 (×6): 500 mg via ORAL
  Filled 2020-06-28 (×11): qty 1

## 2020-06-28 MED ORDER — DEXAMETHASONE SODIUM PHOSPHATE 4 MG/ML INJECTION SOLUTION
4.0000 mg | Freq: Four times a day (QID) | INTRAMUSCULAR | Status: DC
Start: 2020-06-28 — End: 2020-07-06
  Administered 2020-06-28 – 2020-07-02 (×15): 4 mg via INTRAVENOUS
  Administered 2020-07-02: 0 mg via INTRAVENOUS
  Administered 2020-07-02 – 2020-07-06 (×17): 4 mg via INTRAVENOUS
  Filled 2020-06-28 (×42): qty 1

## 2020-06-28 MED ORDER — AMLODIPINE 5 MG TABLET
10.0000 mg | ORAL_TABLET | Freq: Every day | ORAL | Status: DC
Start: 2020-06-28 — End: 2020-07-08
  Administered 2020-06-28 – 2020-07-01 (×4): 10 mg via ORAL
  Administered 2020-07-02: 0 mg via ORAL
  Filled 2020-06-28 (×5): qty 2

## 2020-06-28 MED ORDER — FLUTICASONE PROPIONATE 50 MCG/ACTUATION NASAL SPRAY,SUSPENSION
1.0000 | Freq: Every day | NASAL | Status: DC
Start: 2020-06-28 — End: 2020-07-08
  Administered 2020-06-28: 0 via NASAL
  Administered 2020-06-29: 1 via NASAL
  Administered 2020-06-30: 0 via NASAL
  Administered 2020-07-01: 1 via NASAL
  Administered 2020-07-02 – 2020-07-03 (×2): 0 via NASAL
  Administered 2020-07-04 – 2020-07-08 (×5): 1 via NASAL
  Filled 2020-06-28: qty 16

## 2020-06-28 MED ORDER — BISOPROLOL 10 MG-HYDROCHLOROTHIAZIDE 6.25 MG TABLET
1.0000 | ORAL_TABLET | Freq: Every morning | ORAL | Status: DC
Start: 2020-06-28 — End: 2020-06-28

## 2020-06-28 MED ORDER — TIMOLOL MALEATE 0.25 % EYE DROPS
1.0000 [drp] | Freq: Two times a day (BID) | OPHTHALMIC | Status: DC
Start: 2020-06-28 — End: 2020-07-08
  Administered 2020-06-28: 0 [drp] via OPHTHALMIC
  Administered 2020-06-28 – 2020-07-01 (×7): 1 [drp] via OPHTHALMIC
  Administered 2020-07-02 – 2020-07-05 (×3): 0 [drp] via OPHTHALMIC
  Filled 2020-06-28: qty 10

## 2020-06-28 MED ORDER — SODIUM CHLORIDE 0.9 % INTRAVENOUS PIGGYBACK
2.0000 g | INTRAVENOUS | Status: DC
Start: 2020-06-28 — End: 2020-07-02
  Administered 2020-06-28: 0 g via INTRAVENOUS
  Administered 2020-06-28: 2 g via INTRAVENOUS
  Administered 2020-06-29: 0 g via INTRAVENOUS
  Administered 2020-06-29 – 2020-06-30 (×2): 2 g via INTRAVENOUS
  Administered 2020-06-30 – 2020-07-01 (×2): 0 g via INTRAVENOUS
  Administered 2020-07-01: 2 g via INTRAVENOUS
  Administered 2020-07-02: 0 g via INTRAVENOUS
  Administered 2020-07-02: 2 g via INTRAVENOUS
  Filled 2020-06-28 (×6): qty 20

## 2020-06-28 MED ORDER — DEXAMETHASONE SODIUM PHOSPHATE 10 MG/ML INJECTION SOLUTION
10.0000 mg | Freq: Four times a day (QID) | INTRAMUSCULAR | Status: DC
Start: 2020-06-28 — End: 2020-06-28
  Administered 2020-06-28 (×2): 10 mg via INTRAVENOUS
  Filled 2020-06-28: qty 1

## 2020-06-28 MED ORDER — GADOTERIDOL 279.3 MG/ML INTRAVENOUS SOLUTION
13.0000 mL | INTRAVENOUS | Status: AC
Start: 2020-06-28 — End: 2020-06-28
  Administered 2020-06-28: 13 mL via INTRAVENOUS
  Filled 2020-06-28: qty 15

## 2020-06-28 MED ORDER — INSULIN LISPRO 100 UNIT/ML INJECTION SSIP - CITY
1.0000 [IU] | Freq: Four times a day (QID) | SUBCUTANEOUS | Status: DC
Start: 2020-06-28 — End: 2020-07-08
  Administered 2020-06-28: 1 [IU] via SUBCUTANEOUS
  Administered 2020-06-28 – 2020-06-29 (×2): 0 [IU] via SUBCUTANEOUS
  Administered 2020-06-29: 14:00:00 1 [IU] via SUBCUTANEOUS
  Administered 2020-06-29: 0 [IU] via SUBCUTANEOUS
  Administered 2020-06-29 – 2020-07-01 (×9): 1 [IU] via SUBCUTANEOUS
  Administered 2020-07-02 (×4): 0 [IU] via SUBCUTANEOUS
  Administered 2020-07-03 (×2): 1 [IU] via SUBCUTANEOUS
  Administered 2020-07-03 (×2): 0 [IU] via SUBCUTANEOUS
  Administered 2020-07-04 (×2): 1 [IU] via SUBCUTANEOUS
  Administered 2020-07-04 (×2): 0 [IU] via SUBCUTANEOUS
  Administered 2020-07-05 (×2): 1 [IU] via SUBCUTANEOUS
  Administered 2020-07-05 – 2020-07-06 (×3): 0 [IU] via SUBCUTANEOUS
  Administered 2020-07-06 – 2020-07-08 (×8): 1 [IU] via SUBCUTANEOUS
  Filled 2020-06-28 (×2): qty 300

## 2020-06-28 MED ORDER — IOPAMIDOL 370 MG IODINE/ML (76 %) INTRAVENOUS SOLUTION
100.0000 mL | INTRAVENOUS | Status: AC
Start: 2020-06-28 — End: 2020-06-28
  Administered 2020-06-28: 14:00:00 100 mL via INTRAVENOUS
  Filled 2020-06-28: qty 100

## 2020-06-28 NOTE — Consults (Signed)
[facility]  Initial Note      Carrie Escobar, Carrie Escobar  Encounter Start Date:  06/27/2020  Inpatient Admission Date: 06/27/2020  Date of Service: 06/28/2020  Date of Birth:  [birth year]    Requesting Provider: Dr. Darnelle Escobar  Information Obtained from: patient and history reviewed via medical record  Reason for Consultation:  brain masses. Concern for GBM; Meningioma    Impression:  Carrie Escobar is a 74 y.o. female presented with memory loss, confusion and word-finding difficulties for the past 4-6  weeks.  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  Neurosurgery was consulted, plan for biopsy.  Patient also underwent CT chest abdomen pelvis, read pending    Heme/Onc Diagnosis:   -multiple enhancing masses in left parietal, temporal and occipital lobes  -confusion/memory loss  -history of PE in 1999 after surgery    Recommendations:   -differential includes multifocal GBM versus lymphoma versus metastatic brain disease  -will await report of CT chest abdomen pelvis to assess for systemic disease  -plan for biopsy next week, should wait at least 5 days since last dose of aspirin.  Neurosurgery on board  -further treatment plan depends on pathology report  -continue with Keppra and Decadron    Will continue to follow, please call with any questions  Carrie Mincey, MD     ________________________________________________________________________________________________________________________________________________     HPI:  74 year old female with chronic active medical problems which include hypertension, history of PE in 1999 after hysterectomy presented with memory loss, easy forgetfulness over the past 4 weeks.  It has worsened to the point where the patient was not able to remember names of close friends/family members.  Patient is a nonsmoker.  She had unintentional weight loss of about 10 lb in  the last month.  She denies headaches, blurry vision, weakness in the extremities, speech changes.  However during conversation patient is not able to comprehend and appears to have word-finding difficulties.  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  Patient is currently on dexamethasone 4 mg q.4 hours p.r.n., Keppra 1000 mg b.i.d..  Neurosurgery is consulted, Carrie Escobar has evaluated the patient, plan for left temporal biopsy in 5 days as patient was on aspirin 81 mg.  -no prior personal history of cancer  Patient denies family history of cancers in parents or siblings  -brother is MPOA.  I discussed with sister Carrie Escobar over the phone regarding findings on MRI and plan for biopsy  _  ________________________________________________________________________________________________________________________________________________     Objective   Past Medical History:   Diagnosis Date   . Breast mass, right     benign   . Cataract     bilat   . Family history of colon cancer    . Heartburn    . HTN (hypertension)    . Nausea with vomiting     sister with PONV   . Obesity    . Pulmonary emboli     only once in 1999 after her hysterectomy   . Wears glasses     reading         Past Surgical History:   Procedure Laterality Date   . 51700 - INJ SINGLE TENDON SHEATH/LIGAMENT, APONEUROSIS W/ [place] INTERP ONLY (AMB ONLY)  02/10/2016   . HX BREAST BIOPSY Right     BENIGN    . HX CATARACT REMOVAL  10/2013    left   . HX HYSTERECTOMY  1999    fibroid   . HX ROTATOR CUFF REPAIR  11/04/2013    right shoulder         Medications Prior to Admission     Prescriptions    amLODIPine (NORVASC) 10 mg Oral Tablet    TAKE 1 TABLET BY MOUTH EVERY DAY    aspirin (ECOTRIN) 81 mg Oral Tablet, Delayed Release (E.C.)    Take 81 mg by mouth Once a day    bisoprolol-hydroCHLOROthiazide (ZIAC) 10-6.25 mg Oral Tablet    TAKE 1 TABLET BY  MOUTH EVERY MORNING    calcium citrate-vitamin D3 (CITRACAL) 200 mg calcium -250 unit Oral Tablet    Take 1 Tab by mouth Once a day    Flaxseed Oil 1,000 mg Oral Capsule    Take by mouth    fluticasone (FLONASE) 50 mcg/actuation Nasal Spray, Suspension    1 Spray by Each Nostril route Twice per day as needed    metFORMIN (GLUCOPHAGE XR) 500 mg Oral Tablet Sustained Release 24 hr    Take 1 Tablet (500 mg total) by mouth Once a day    multivitamin Oral Tablet    Take 1 Tab by mouth Once a day    timolol maleate (TIMOPTIC) 0.25 % Ophthalmic Drops    Instill 1 Drop into both eyes Twice daily          amLODIPine (NORVASC) tablet, 10 mg, Oral, Daily  cefTRIAXone (ROCEPHIN) 2 g in NS 50 mL IVPB minibag, 2 g, Intravenous, Q24H  dexamethasone 4 mg/mL injection, 4 mg, Intravenous, Q6H  fluticasone (FLONASE) 50 mcg per spray nasal spray, 1 Spray, Each Nostril, Daily  levETIRAcetam (KEPPRA) 1000 mg in iso-osmotic 100 mL premix IVPB, 1,000 mg, Intravenous, Q12H  LORazepam (ATIVAN) 2 mg/mL injection, 2 mg, Intravenous, Q4H PRN  [Held by provider] metFORMIN (GLUCOPHAGE XR) extended release tablet, 500 mg, Oral, Daily  SSIP insulin lispro (HUMALOG) 100 units/mL SubQ pen, 1-12 Units, Subcutaneous, 4x/day AC  timolol (TIMOPTIC) 0.25% ophthalmic solution, 1 Drop, Right Eye, 2x/day      Allergies   Allergen Reactions   . Amoxicillin Rash       Family History: Reviewed, discussed  Family Medical History:     Problem Relation (Age of Onset)    Breast Cancer Maternal Grandmother    Cancer Mother, Sister    Heart Attack Sister    Hypertension (High Blood Pressure) Mother    No Known Problems Brother, Maternal Grandfather, Paternal Grandmother, Paternal 89, Daughter, Son, Maternal Aunt, Maternal Uncle, Paternal 43, Paternal Uncle, Other    Stroke Mother, Father          Social History  Social History     Socioeconomic History   . Marital status: Divorced     Spouse name: Not on file   . Number of children: 0   . Years of  education: Not on file   . Highest education level: Not on file   Occupational History     Employer: [employer]   Tobacco Use   . Smoking status: Never Smoker   . Smokeless tobacco: Never Used   Substance and Sexual Activity   . Alcohol use: No     Alcohol/week: 0.0 standard drinks   . Drug use: No   . Sexual activity: Not on file   Other Topics Concern   . Abuse/Domestic Violence No   . Breast Self Exam Not Asked   . Caffeine Concern  Not Asked   . Calcium intake adequate Not Asked   . Computer Use Not Asked   . Drives Yes   . Exercise Concern Not Asked   . Helmet Use Not Asked   . Seat Belt Not Asked   . Special Diet No   . Sunscreen used Not Asked   . Uses Cane No   . Uses walker No   . Uses wheelchair No   . Right hand dominant Yes   . Left hand dominant No   . Ambidextrous Not Asked   . Shift Work Not Asked   . Unusual Sleep-Wake Schedule Not Asked   . Ability to Walk 1 Flight of Steps without SOB/CP Yes   . Routine Exercise Yes     Comment: Zumba class   . Ability to Walk 2 Flight of Steps without SOB/CP Yes   . Unable to Ambulate Not Asked   . Total Care Not Asked   . Ability To Do Own ADL's Yes   . Uses Walker Not Asked   . Other Activity Level Not Asked   . Uses Cane Not Asked   Social History Narrative   . Not on file     Social Determinants of Health     Financial Resource Strain:    . Difficulty of Paying Living Expenses:    Food Insecurity:    . Worried About Charity fundraiser in the Last Year:    . Arboriculturist in the Last Year:    Transportation Needs:    . Film/video editor (Medical):    Marland Kitchen Lack of Transportation (Non-Medical):    Physical Activity:    . Days of Exercise per Week:    . Minutes of Exercise per Session:    Stress:    . Feeling of Stress :    Intimate Partner Violence:    . Fear of Current or Ex-Partner:    . Emotionally Abused:    Marland Kitchen Physically Abused:    . Sexually Abused:        REVIEW OF SYSTEMS  Review of Systems   Constitutional: Positive for fatigue.   Respiratory: Negative for  cough and shortness of breath.    Cardiovascular: Negative for leg swelling.   Gastrointestinal: Negative for abdominal pain, diarrhea and nausea.   Genitourinary: Negative for dysuria.    Skin: Negative for rash.   Neurological: Positive for speech difficulty. Negative for headaches and seizures.   Psychiatric/Behavioral: Positive for confusion.        EXAM  VITALS:    Temperature: 36.7 C (98.1 F)  Heart Rate: 63  BP (Non-Invasive): 132/73  Respiratory Rate: 16  SpO2: 99 %  Physical Exam  Constitutional:       General: She is not in acute distress.  Cardiovascular:      Rate and Rhythm: Normal rate and regular rhythm.   Pulmonary:      Effort: Pulmonary effort is normal. No respiratory distress.      Breath sounds: Normal breath sounds.   Abdominal:      General: Abdomen is flat.      Palpations: Abdomen is soft.      Tenderness: There is no abdominal tenderness.   Musculoskeletal:      Right lower leg: No edema.      Left lower leg: Edema present.   Lymphadenopathy:      Cervical: No cervical adenopathy.   Skin:     Findings: No rash.  Neurological:      Mental Status: She is alert.      Motor: No weakness.      Comments: Alert, oriented x1  Expressive aphasia, word-finding difficulties          IMAGES:  reviewed by radiology and independently by me, agree with radiologic description    MRI brain with and without contrast 06/27/2020:  Multiple enhancing masses in the left parietal, temporal and occipital  lobes which are connected by masslike T2/FLAIR signal abnormality which  also extends into the splenium of the corpus callosum. 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 The largest mass extends along the  ependymal lining of the left lateral ventricle.    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    LABS:    I have reviewed all lab results.  Lab Results for Last 24 Hours:    Results for orders placed or performed during the hospital encounter of 06/27/20 (from the  past 24 hour(s))   COVID-19 SCREENING (COVID only)   Result Value Ref Range    SARS-CoV-2 Not Detected Not Detected   POC FINGERSTICK GLUCOSE - [facility] (RESULTS)   Result Value Ref Range    GLUCOSE, POC 134 (H) 60 - 100 mg/dl   POC FINGERSTICK GLUCOSE - [facility] (RESULTS)   Result Value Ref Range    GLUCOSE, POC 182 (H) 60 - 100 mg/dl             Mirna Mires, MD

## 2020-06-28 NOTE — Nurses Notes (Signed)
Patient's brother called and updated on patient's condition. All questions and concerns addressed. Will be here tomorrow as he is out of town this weekend. ICU phone number and patient's privacy passcode given to brother and was instructed to call if he had any questions or concerns. Acknowledged information.

## 2020-06-28 NOTE — Nurses Notes (Signed)
Spoke with Dr. Ebony Hail. He would like the patient's follow-up MRI completed this afternoon instead of tomorrow AM. MRI called and scheduled MRI for 1500. Patient updated on plan of care.

## 2020-06-28 NOTE — Consults (Signed)
[facility]  Neurosurgery Consult  Initial Consult Note      Carrie Escobar, Carrie Escobar, 74 y.o. female  Encounter Start Date:  06/27/2020  Inpatient Admission Date: 06/27/2020  Date of Service: 06/28/2020  Date of Birth:  05-13-1946    Primary Service: Hospitalist/ICU  Requesting Faculty: [facility]  Reason for Consult: Brain mass      Information Obtained from: patient  Chief Complaint: Confusion    HPI:  Carrie Escobar is a 74 y.o., right handed 50 American female who presents with progressive confusion, memory loss, and forgetfulness over the last month or so.  The patient reports that she has had gradual worsening of her memory with inability to remember close family members names as well as daily activities.  She also reports an unexpected weight loss of approximately 10 lb over the last month or so.  She reports when she stands she gets some dizziness but is able to ambulate.  She currently denies headaches, blurry vision, visual obscurations, double vision, loss of peripheral vision, or weakness.  CT and subsequent MRI imaging of the brain was performed with concern for a new brain lesion.  Consequently, Neurosurgery was consulted.  The patient of note is on aspirin 81 mg for prophylaxis.  She has remote history of a pulmonary embolus in 1999 after hysterectomy.    ROS: (MUST comment on all "Abnormal" findings)  Other than ROS in the HPI, all other systems were negative.    PAST MEDICAL/ FAMILY/ SOCIAL HISTORY:   Past Medical History:   Diagnosis Date   . Breast mass, right     benign   . Cataract     bilat   . Family history of colon cancer    . Heartburn    . HTN (hypertension)    . Nausea with vomiting     sister with PONV   . Obesity    . Pulmonary emboli     only once in 1999 after her hysterectomy   . Wears glasses     reading         Medications Prior to Admission     Prescriptions    amLODIPine (NORVASC) 10 mg Oral Tablet    TAKE 1 TABLET BY MOUTH EVERY DAY    aspirin (ECOTRIN) 81  mg Oral Tablet, Delayed Release (E.C.)    Take 81 mg by mouth Once a day    bisoprolol-hydroCHLOROthiazide (ZIAC) 10-6.25 mg Oral Tablet    TAKE 1 TABLET BY MOUTH EVERY MORNING    calcium citrate-vitamin D3 (CITRACAL) 200 mg calcium -250 unit Oral Tablet    Take 1 Tab by mouth Once a day    Flaxseed Oil 1,000 mg Oral Capsule    Take by mouth    fluticasone (FLONASE) 50 mcg/actuation Nasal Spray, Suspension    1 Spray by Each Nostril route Twice per day as needed    metFORMIN (GLUCOPHAGE XR) 500 mg Oral Tablet Sustained Release 24 hr    Take 1 Tablet (500 mg total) by mouth Once a day    multivitamin Oral Tablet    Take 1 Tab by mouth Once a day    timolol maleate (TIMOPTIC) 0.25 % Ophthalmic Drops    Instill 1 Drop into both eyes Twice daily           amLODIPine (NORVASC) tablet, 10 mg, Oral, Daily  cefTRIAXone (ROCEPHIN) 2 g in NS 50 mL IVPB minibag, 2 g, Intravenous, Q24H  dexamethasone 4 mg/mL injection, 4 mg, Intravenous,  Q6H  fluticasone (FLONASE) 50 mcg per spray nasal spray, 1 Spray, Each Nostril, Daily  iopamidol (ISOVUE-370) 76% infusion, 100 mL, Intravenous, Give in Radiology  levETIRAcetam (KEPPRA) 1000 mg in iso-osmotic 100 mL premix IVPB, 1,000 mg, Intravenous, Q12H  LORazepam (ATIVAN) 2 mg/mL injection, 2 mg, Intravenous, Q4H PRN  [Held by provider] metFORMIN (GLUCOPHAGE XR) extended release tablet, 500 mg, Oral, Daily  SSIP insulin lispro (HUMALOG) 100 units/mL SubQ pen, 1-12 Units, Subcutaneous, 4x/day AC  timolol (TIMOPTIC) 0.25% ophthalmic solution, 1 Drop, Right Eye, 2x/day      Allergies   Allergen Reactions   . Amoxicillin Rash     Past Surgical History:   Procedure Laterality Date   . 01601 - INJ SINGLE TENDON SHEATH/LIGAMENT, APONEUROSIS W/ [place] INTERP ONLY (AMB ONLY)  02/10/2016   . HX BREAST BIOPSY Right     BENIGN    . HX CATARACT REMOVAL  10/2013    left   . HX HYSTERECTOMY  1999    fibroid   . HX ROTATOR CUFF REPAIR  11/04/2013    right shoulder         Social History     Tobacco Use   .  Smoking status: Never Smoker   . Smokeless tobacco: Never Used   Substance Use Topics   . Alcohol use: No     Alcohol/week: 0.0 standard drinks   . Drug use: No     Family Medical History:     Problem Relation (Age of Onset)    Breast Cancer Maternal Grandmother    Cancer Mother, Sister    Heart Attack Sister    Hypertension (High Blood Pressure) Mother    No Known Problems Brother, Maternal Grandfather, Paternal Grandmother, Paternal Grandfather, Daughter, Son, Maternal Aunt, Maternal Uncle, Paternal 87, Paternal Uncle, Other    Stroke Mother, Father              PHYSICAL EXAMINATION: (MUST comment on all "Abnormal" findings)    Constitutional  Temperature: 36.7 C (98.1 F)  Heart Rate: 63  BP (Non-Invasive): 132/73  Respiratory Rate: 16  SpO2: 99 %  General appearance: Normal  Eyes: (ophthalmic exam of optic discs and posterior segments)Normal    Neurologic examination:  Alert orient times 1-2.  The patient was able to get the year after multiple prompting but was not able to repeat this.  She was alert to her name.  Evidence of a incomplete right superior quadrant defect.  Otherwise cranial nerves 2-12 grossly intact.  Strength 5/5 throughout.  No evidence of drift.  Sensation intact to light touch and pinprick.    Labs Ordered/ Reviewed : (Please indicate ordered or reviewed)  Reviewed: Labs:  BMP:     Lab Results   Component Value Date    SODIUM 142 06/27/2020    POTASSIUM 3.6 06/27/2020    CHLORIDE 103 06/27/2020    CO2 29 06/27/2020    BUN 7 (L) 06/27/2020    CREATININE 0.89 06/27/2020    GLUCOSECJ 97 03/10/2015    GLUCOSE Negative 06/27/2020    ANIONGAP 10 06/27/2020    GFR 75 06/27/2020    CALCIUM 10.4 (H) 06/27/2020       Radiology Tests Ordered/ Reviewed: (Please indicate ordered or reviewed)  CT head:IMPRESSION:  Area of hypoattenuation in the left temporal and parietal lobes  predominantly involving the white matter as described above. This is  concerning for vasogenic edema secondary to possible  mass lesion in this  area. An evolving infarct is  unlikely. Recommend MRI for further  Evaluation.        MRI brain:FINDINGS: Enhancing mass with central necrosis in the left parietal and  left temporal lobe including the mesial temporal lobe measuring 4.2 cm AP  dimension by 2.9 cm transverse dimension by 2.8 cm craniocaudal dimension.  The mass involves the deep and periventricular white matter and extends  along the ependymal lining of the left lateral ventricle involving the  trigone, temporal and occipital horn. Hyperperfusion is noted to the  peripheral enhancing portions of the mass. There is surrounding masslike  nonenhancing signal abnormality in the left parietal, temporal and  occipital lobes as well as extending across the splenium of the corpus  callosum. An additional lesion just posterior to the above-mentioned lesion  connected by the abnormal white matter signal to the larger mass measuring  1.1 cm x 0.6 cm is seen. There is also a separate left occipital mass  measuring 9 mm x 10 mm, which is also connected to the above-mentioned  lesions by T2/FLAIR signal abnormality. Some effacement of the trigone and  temporal horns of the left lateral ventricle without significant midline  shift. No associated hemorrhage.    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    IMPRESSION:    Multiple enhancing masses in the left parietal, temporal and occipital  lobes which are connected by masslike T2/FLAIR signal abnormality which  also extends into the splenium of the corpus callosum. 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 The largest mass extends along the  ependymal lining of the left lateral ventricle.    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        Impression/Recommendations  Ms. Carrie Escobar is a pleasant 74 year old right-handed female presenting initial consultation  with newly diagnosed multiple brain lesions.  The patient has had progressive issues with memory as well as general confusion.  She is currently alert orient times 1-2 with a incomplete right superior quadrant defect.  She is otherwise neurologically intact.  Her MRI imaging reveals what appears to be a multifocal glioblastoma versus lymphoma.  Lesser on the differential is brain metastases.  Consequently, I have ordered a CT of the chest abdomen and pelvis.  If this is negative, I discussed with the patient and her family consideration for a left temporal brain biopsy.  If this is in fact multifocal glioblastoma or lymphoma no further surgical indications is warranted other than establishing diagnosis.  As she has been placed on steroids by the hospitalist, I have ordered an MR of the brain for the morning to assess for stability of the lesions.  As she is on aspirin we will need to have her off of the aspirin for 5 days prior to the biopsy.  Consequently, I talked to the family in her the patient about biopsy this coming Thursday.  We will reconvene after the additional imaging is complete.  I placed her on Keppra 1000 mg b.i.d. and ICU observation until the follow-up of the MRI tomorrow morning.  She can then be managed on the ward prior to the biopsy.  I will reconvene with the primary team as well as the family following the additional imaging.  Please call with any questions or concerns.

## 2020-06-28 NOTE — Nurses Notes (Signed)
Patient arrived via wheelchair to unit. Patient escorted to room by ICU Tech. Patient oriented to room and call-light. Plan of care reviewed for this shift. Patient confused and keeps saying "I'm sorry". Bed alarm set.

## 2020-06-28 NOTE — Nurses Notes (Signed)
Patient transferred to and from CT Scan via wheelchair at this time without any difficulty. All vital signs remained stable throughout scan and transport.     Patient back in bed eating her lunch. Her friend at the bedside with her. Call light placed within reach and bed alarm turned on.

## 2020-06-28 NOTE — ED Nurses Note (Signed)
Report called to [place] RN ICU:  reviewed pt ED coarse, allergies, v/s, med hx, diagnostics, pt response to care.  No change in assessment.  Cont.NSR .  Pt assisted up to bathroom. Mild difficulty with balance when changing postion getting out of bed..  Denies any discomfort.  Pt placed on heart monitor, for ALS RN transport to flr.

## 2020-06-28 NOTE — Care Plan (Signed)
Problem: Adult Inpatient Plan of Care  Goal: Plan of Care Review  Outcome: Ongoing (see interventions/notes)  Goal: Patient-Specific Goal (Individualized)  Outcome: Ongoing (see interventions/notes)  Goal: Absence of Hospital-Acquired Illness or Injury  Outcome: Ongoing (see interventions/notes)  Goal: Optimal Comfort and Wellbeing  Outcome: Ongoing (see interventions/notes)  Goal: Rounds/Family Conference  Outcome: Ongoing (see interventions/notes)     Problem: Seizure, Active Management  Goal: Absence of Seizure/Seizure-Related Injury  Outcome: Ongoing (see interventions/notes)     Problem: Diabetes Comorbidity  Goal: Blood Glucose Level Within Targeted Range  Outcome: Ongoing (see interventions/notes)     Problem: Hypertension Comorbidity  Goal: Blood Pressure in Desired Range  Outcome: Ongoing (see interventions/notes)     Problem: Cognitive Impairment  Goal: Optimal Functional Independence  Outcome: Ongoing (see interventions/notes)     Problem: Coping Ineffective (Oncology Care)  Goal: Effective Coping  Outcome: Ongoing (see interventions/notes)     Problem: Fatigue (Oncology Care)  Goal: Improved Activity Tolerance  Outcome: Ongoing (see interventions/notes)     Problem: Oral Intake Altered (Oncology Care)  Goal: Optimal Oral Intake  Outcome: Ongoing (see interventions/notes)     Problem: Acute Neurologic Deterioration  Goal: Absence of Acute Neurologic Symptoms  Outcome: Ongoing (see interventions/notes)     Problem: Skin Injury Risk Increased  Goal: Skin Health and Integrity  Outcome: Ongoing (see interventions/notes)

## 2020-06-28 NOTE — Nurses Notes (Signed)
Admitted to [facility] from ED at [place].

## 2020-06-28 NOTE — Nurses Notes (Signed)
Dr. Warren Lacy & Dr. Mauri Pole paged regarding new consult for this patient.

## 2020-06-28 NOTE — Care Plan (Signed)
Problem: Adult Inpatient Plan of Care  Goal: Plan of Care Review  Outcome: Ongoing (see interventions/notes)  Goal: Patient-Specific Goal (Individualized)  Outcome: Ongoing (see interventions/notes)  Goal: Absence of Hospital-Acquired Illness or Injury  Outcome: Ongoing (see interventions/notes)  Goal: Optimal Comfort and Wellbeing  Outcome: Ongoing (see interventions/notes)  Goal: Rounds/Family Conference  Outcome: Ongoing (see interventions/notes)     Problem: Seizure, Active Management  Goal: Absence of Seizure/Seizure-Related Injury  Outcome: Ongoing (see interventions/notes)     Problem: Diabetes Comorbidity  Goal: Blood Glucose Level Within Targeted Range  Outcome: Ongoing (see interventions/notes)     Problem: Hypertension Comorbidity  Goal: Blood Pressure in Desired Range  Outcome: Ongoing (see interventions/notes)     Problem: Cognitive Impairment  Goal: Optimal Functional Independence  Outcome: Ongoing (see interventions/notes)     Problem: Coping Ineffective (Oncology Care)  Goal: Effective Coping  Outcome: Ongoing (see interventions/notes)     Problem: Fatigue (Oncology Care)  Goal: Improved Activity Tolerance  Outcome: Ongoing (see interventions/notes)     Problem: Oral Intake Altered (Oncology Care)  Goal: Optimal Oral Intake  Outcome: Ongoing (see interventions/notes)     Problem: Acute Neurologic Deterioration  Goal: Absence of Acute Neurologic Symptoms  Outcome: Ongoing (see interventions/notes)

## 2020-06-28 NOTE — Care Management Notes (Signed)
CM unable to meet with patient at bedside, CM will follow up with patient as of 06-29-2020 for CM DCP assessment.

## 2020-06-28 NOTE — Ancillary Notes (Addendum)
[facility]  Medical Nutrition Therapy Screen Note                                      Date of Service: 06/28/2020    Reason for Note: Nursing Nutritional Risk notification:  weight loss in the last 3 months or less    Current Diet Order/Nutrition Support:  DIET CARDIAC    Reviewed patient status, diet order/TF/TPN, labs and medications.  Height: 165.1 cm (5\' 5" )   Weight: 65 kg (143 lb 4.8 oz) (06/28/20 0316)   Body mass index is 23.85 kg/m.    Brief Subjective:   Pt is a 74 yo F admitted for multiple brain masses. Has been weak and feeling off balance with memory impairment recently.   Pending neurologist and neurosurgeon evaluations.  PMH consisting of HTN.  Diet ordered is Cardiac; newly admitted to the floor so no intakes yet available. No reported GI symptoms.  Braden Score = 19; nutrition = 3; adequate. Last BM 9/18.  Nutrition Related Labs: no new labs this date.  Nutrition Related Meds: rocephin, Keppra, and metformin  Weight is appropriate for age; pt is 103.5% of IBW.   Review of weight hx does indicate loss: pt is down 10.5% x 4 months along with a loss of 17.6% x 9 months (both clinically significant).     Most Recent Screen Previous screen   Impaired Nutrition Status Score Impaired Nutrition Status Score: 0 - Normal nutritional status (06/28/20 0600)   Impaired Nutrition Status Score: 0 - Normal nutritional status (06/28/20 0600)           Severity of Disease Score Severity of Disease Score: 0 - Normal nutritional requirements - Absent (06/28/20 0600)   Severity of Disease Score: 0 - Normal nutritional requirements - Absent (06/28/20 0600)       Age Age: 20 - > 70 years (06/28/20 0600) Age: 20 - > 70 years (06/28/20 0600)     Score Score: 1 (06/28/20 0600)     Score: 1 (06/28/20 0600)   Risk Level Risk Level: Level 1 - 0 to 1 pt - No initial note required. Follow up within 7 days (06/28/20 0600)   Risk Level: Level 1 - 0 to 1 pt - No initial note  required. Follow up within 7 days (06/28/20 0600)           Nutrition related problems: unintentional weight loss (maintains body weight which is appropriate for age at this time).    Assessment: Needs assessed/reassessed in 3-4 days    Monitor: Po status and Tolerance of diet along with intakes and weight status.    Plan/Intervention: Add supplement Ensure HP once daily to start. Will see how PO intakes are at next assessment and adjust appropraitely.   Continue with same regimen  Will continue to monitor per screening protocol     - Suggest liberalizing diet to Regular.    Moise Boring, [place]

## 2020-06-28 NOTE — Nurses Notes (Signed)
Dr. Ebony Hail called unit regarding this patient. Received verbal orders to decrease the dexamethasone to 4mg  Q6 hours, CT Chest/ABD/Pelvis W&w/o contrast, and D/C PET scan. Orders placed.

## 2020-06-28 NOTE — Consults (Signed)
[facility]    Neurology Consult Note        Carrie Escobar   [id number]      Date of service: 06/28/2020  Date of Birth:  20-Mar-1946    Consulting Physician: Kevan Rosebush MD    Chief Complaint: Confusion, abnormal imaging, L temporal lobe    History of Present Illness:     History was obtained from a review of the electronic record and discussion with the patient and family, her sister.  Carrie Escobar is a 74 y.o. female who has been confused for a week or two. CT in the ED followed by a cranial MRI shows a lesion in the L TL which is subcortical, with mild mass effect, and with contrast enhancement. Located in portions of the temporal, parietal, and occipital lobes.    Past Medical History  Past Medical History:   Diagnosis Date   . Breast mass, right     benign   . Cataract     bilat   . Family history of colon cancer    . Heartburn    . HTN (hypertension)    . Nausea with vomiting     sister with PONV   . Obesity    . Pulmonary emboli     only once in 1999 after her hysterectomy   . Wears glasses     reading             Past Surgical History  Past Surgical History:   Procedure Laterality Date   . 01751 - INJ SINGLE TENDON SHEATH/LIGAMENT, APONEUROSIS W/ [place] INTERP ONLY (AMB ONLY)  02/10/2016   . HX BREAST BIOPSY Right     BENIGN    . HX CATARACT REMOVAL  10/2013    left   . HX HYSTERECTOMY  1999    fibroid   . HX ROTATOR CUFF REPAIR  11/04/2013    right shoulder           MEDICATIONS FOR CURRENT ENCOUNTER:  Current Facility-Administered Medications   Medication Dose Route Frequency   . amLODIPine (NORVASC) tablet  10 mg Oral Daily   . cefTRIAXone (ROCEPHIN) 2 g in NS 50 mL IVPB minibag  2 g Intravenous Q24H   . dexamethasone 4 mg/mL injection  4 mg Intravenous Q6H   . fluticasone (FLONASE) 50 mcg per spray nasal spray  1 Spray Each Nostril Daily   . iopamidol (ISOVUE-370) 76% infusion  100 mL Intravenous Give in Radiology   . levETIRAcetam (KEPPRA) 1000 mg in iso-osmotic 100 mL premix IVPB   1,000 mg Intravenous Q12H   . LORazepam (ATIVAN) 2 mg/mL injection  2 mg Intravenous Q4H PRN   . metFORMIN (GLUCOPHAGE XR) extended release tablet  500 mg Oral Daily   . timolol (TIMOPTIC) 0.25% ophthalmic solution  1 Drop Right Eye 2x/day         Allergies  Allergies   Allergen Reactions   . Amoxicillin Rash       Social History  Social History     Tobacco Use   . Smoking status: Never Smoker   . Smokeless tobacco: Never Used   Substance Use Topics   . Alcohol use: No     Alcohol/week: 0.0 standard drinks   . Drug use: No       Family History  Family Medical History:     Problem Relation (Age of Onset)    Breast Cancer Maternal Grandmother    Cancer Mother, Sister  Heart Attack Sister    Hypertension (High Blood Pressure) Mother    No Known Problems Brother, Maternal Grandfather, Paternal Grandmother, Paternal Grandfather, Daughter, Son, Maternal 41, Maternal Uncle, Paternal 50, Paternal Uncle, Other    Stroke Mother, Father              REVIEW OF SYSTEMS: Not reliable.    General Exam:  BP (!) 130/59   Pulse 57   Temp 36.9 C (98.5 F)   Resp 19   Ht 1.651 m (5\' 5" )   Wt 65 kg (143 lb 4.8 oz)   SpO2 97%   BMI 23.85 kg/m       This is an AA woman who is alert and with a somewhat disinhibited affect.    MSE: No paraphasias, but not oriented to the date or to the name of her sister, who is sitting next to her.  Can follow simple directions.    Cns were normal except for abnormal VFs showing defect in the R temporal region.    Motor: No drift, no LE weakness.    Plantar responses were flexor B.      Lab Review  CBC Results Differential Results   Recent Labs     06/27/20  1145   WBC 8.8   HGB 13.5   HCT 39.5   PLTCNT 225    Recent Results (from the past 30 hour(s))   CBC WITH DIFF    Collection Time: 06/27/20 11:45 AM   Result Value    WBC 8.8    NEUTROPHIL % 74    LYMPHOCYTE % 18    MONOCYTE % 7    EOSINOPHIL % 2    BASOPHIL % 1    BASOPHIL # 0.10            Liver/Pancreas Enzyme Results Liver Function  Results   Recent Results (from the past 30 hour(s))   COMPREHENSIVE METABOLIC PANEL, NON-FASTING    Collection Time: 06/27/20 11:45 AM   Result Value    ALKALINE PHOSPHATASE 59    ALT (SGPT) 14    AST (SGOT)  21    Recent Results (from the past 30 hour(s))   COMPREHENSIVE METABOLIC PANEL, NON-FASTING    Collection Time: 06/27/20 11:45 AM   Result Value    ALBUMIN 4.6    BILIRUBIN TOTAL 0.8              Imaging  CT BRAIN WO IV CONTRAST    Result Date: 06/27/2020  Carrie Escobar Female, 74 years old. CT BRAIN WO IV CONTRAST performed on 06/27/2020 12:21 PM. REASON FOR EXAM:  AMS, RADIATION DOSE: 1221.00 mGy.cm TECHNIQUE: Multiplanar nonenhanced images of the brain COMPARISON: None available FINDINGS:  Area of hypoattenuation left temporal and parietal lobes predominantly involving the white matter. There may also be abnormal hypoattenuation involving the splenium of the corpus callosum. No associated hemorrhage. Mild effacement of the trigone of the left lateral ventricle without significant midline shift. No hydrocephalus. No extra-axial fluid collection.     Area of hypoattenuation in the left temporal and parietal lobes predominantly involving the white matter as described above. This is concerning for vasogenic edema secondary to possible mass lesion in this area. An evolving infarct is unlikely. Recommend MRI for further evaluation. Radiologist location ID: [id number]     MRI BRAIN W/WO CONTRAST    Result Date: 06/27/2020  Carrie Escobar Female, 74 years old. MRI BRAIN W/WO CONTRAST performed on 06/27/2020 3:30 PM. REASON FOR EXAM:  CT shows the suspicious for mass lesion in the left temporal and parietal lobes INTRAVENOUS CONTRAST: 14 ml's of Gadavist CREATININE/GFR: TECHNIQUE: Multiplanar multisequence MRI of the brain with and without contrast COMPARISON: Head CT from the same day FINDINGS: Enhancing mass with central necrosis in the left parietal and left temporal lobe including the mesial temporal  lobe measuring 4.2 cm AP dimension by 2.9 cm transverse dimension by 2.8 cm craniocaudal dimension. The mass involves the deep and periventricular white matter and extends along the ependymal lining of the left lateral ventricle involving the trigone, temporal and occipital horn. Hyperperfusion is noted to the peripheral enhancing portions of the mass. There is surrounding masslike nonenhancing signal abnormality in the left parietal, temporal and occipital lobes as well as extending across the splenium of the corpus callosum. An additional lesion just posterior to the above-mentioned lesion connected by the abnormal white matter signal to the larger mass measuring 1.1 cm x 0.6 cm is seen. There is also a separate left occipital mass measuring 9 mm x 10 mm, which is also connected to the above-mentioned lesions by T2/FLAIR signal abnormality. Some effacement of the trigone and temporal horns of the left lateral ventricle without significant midline shift. No associated hemorrhage. A small dural based mass measuring 8 mm x 4 mm arising from the left aspect of the anterior falx, favored to represent a meningioma. No acute infarct.      Multiple enhancing masses in the left parietal, temporal and occipital lobes which are connected by masslike T2/FLAIR signal abnormality which also extends into the splenium of the corpus callosum. 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 The largest mass extends along the ependymal lining of the left lateral ventricle. 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 Radiologist location ID: [id number]     XR AP MOBILE CHEST (If patient condition warrants)    Result Date: 06/27/2020  RADIOLOGIST: Lars Masson, MD EXAMINATION: XR AP MOBILE CHEST EXAM DATE/TIME: 06/27/2020 12:46 PM CLINICAL INDICATION: Chest pain COMPARISON: None. FINDINGS: Cardiovascular: The cardiac silhouette is within normal limits. Lungs: There are no infiltrates or pulmonary edema.  Pleural spaces: There are no pleural effusions on either side. Chest wall: No acute findings.     No acute radiographic findings. Radiologist location ID: [id number]      I reviewed her imaging.    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  She is on Decadron and has been put on Keppra pro-actively.      Recommendations: Nothing to add.  Biopsy follwed by treatment coordinated by oncology and neurosurgery.          Kevan Rosebush, MD  06/28/2020, 11:43

## 2020-06-29 ENCOUNTER — Inpatient Hospital Stay (HOSPITAL_COMMUNITY): Payer: Medicare PPO

## 2020-06-29 DIAGNOSIS — G9389 Other specified disorders of brain: Secondary | ICD-10-CM

## 2020-06-29 DIAGNOSIS — R4182 Altered mental status, unspecified: Secondary | ICD-10-CM

## 2020-06-29 HISTORY — DX: Other specified disorders of brain: G93.89

## 2020-06-29 LAB — ECG 12-LEAD
Atrial Rate: 53 {beats}/min
Calculated P Axis: 59 degrees
Calculated R Axis: -2 degrees
Calculated T Axis: 32 degrees
PR Interval: 194 ms
QRS Duration: 86 ms
QT Interval: 442 ms
QTC Calculation: 414 ms
Ventricular rate: 53 {beats}/min

## 2020-06-29 LAB — POC FINGERSTICK GLUCOSE - BMC/JMC (RESULTS)
GLUCOSE, POC: 136 mg/dL — ABNORMAL HIGH (ref 60–100)
GLUCOSE, POC: 154 mg/dL — ABNORMAL HIGH (ref 60–100)
GLUCOSE, POC: 120 mg/dL — ABNORMAL HIGH (ref 60–100)

## 2020-06-29 LAB — CBC WITH DIFF
BASOPHIL #: 0 10*3/uL (ref 0.00–0.10)
BASOPHIL %: 0 % (ref 0–3)
EOSINOPHIL #: 0 10*3/uL (ref 0.00–0.50)
EOSINOPHIL %: 0 % (ref 0–5)
HCT: 37.7 % (ref 36.0–45.0)
HGB: 12.9 g/dL (ref 12.0–15.5)
LYMPHOCYTE #: 1 10*3/uL (ref 1.00–4.80)
LYMPHOCYTE %: 4 % — ABNORMAL LOW (ref 15–43)
MCH: 29 pg (ref 27.5–33.2)
MCHC: 34.3 g/dL (ref 32.0–36.0)
MCV: 84.5 fL (ref 82.0–97.0)
MONOCYTE #: 0.5 10*3/uL (ref 0.20–0.90)
MONOCYTE %: 2 % — ABNORMAL LOW (ref 5–12)
MPV: 8.7 fL (ref 7.4–10.5)
NEUTROPHIL #: 23.3 10*3/uL — ABNORMAL HIGH (ref 1.50–6.50)
NEUTROPHIL %: 94 % — ABNORMAL HIGH (ref 43–76)
PLATELETS: 227 10*3/uL (ref 150–450)
RBC: 4.46 10*6/uL (ref 4.00–5.10)
RDW: 14.1 % (ref 11.0–16.0)
WBC: 24.8 10*3/uL — ABNORMAL HIGH (ref 4.0–11.0)

## 2020-06-29 LAB — BASIC METABOLIC PANEL
ANION GAP: 10 mmol/L (ref 4–13)
BUN/CREA RATIO: 18 (ref 6–22)
BUN: 15 mg/dL (ref 8–25)
CALCIUM: 9.9 mg/dL (ref 8.8–10.2)
CHLORIDE: 105 mmol/L (ref 96–111)
CO2 TOTAL: 26 mmol/L (ref 23–31)
CREATININE: 0.83 mg/dL (ref 0.60–1.05)
ESTIMATED GFR: 82 mL/min/BSA (ref >=60)
GLUCOSE: 139 mg/dL — ABNORMAL HIGH (ref 65–125)
POTASSIUM: 3.6 mmol/L (ref 3.5–5.1)
SODIUM: 141 mmol/L (ref 136–145)

## 2020-06-29 LAB — PT/INR
INR: 1.29
PROTHROMBIN TIME: 15 s — ABNORMAL HIGH (ref 9.4–12.5)

## 2020-06-29 LAB — PTT (PARTIAL THROMBOPLASTIN TIME): APTT: 25 s — ABNORMAL LOW (ref 25.1–36.5)

## 2020-06-29 NOTE — Nurses Notes (Signed)
Shift Summary:    Patient transferred to floor from ICU this shift. Patient anxious and agitated after arriving to the floor and throughout the shift. Patient trying to get out of the bed multiple times this shift with bed alarm going off. Unable to redirect. Patient just keeps saying "I'm sorry". Patient keeps taking tele off. Patient pulled out IV's. Patient refused some medications and took others. See EMAR. See flowsheets for assessments. No current needs identified at this time.

## 2020-06-29 NOTE — Procedures (Unsigned)
NAME:  Carrie Escobar, Carrie Escobar [facility] NUMBER:  [id number]  DATE OF SERVICE:  06/29/2020  DOB:  1946-06-17  SEX:  F      Date of Study:  June 29, 2020, start time 13:03:51.  Duration 22.5 minutes.    Technician:  Earlie Lou.    REQUESTING PHYSICIAN:  Addison Naegeli, MD.    HISTORY:  The patient is a 74 year old woman admitted for confusion, found to have multiple brain masses on CT and MRI.  The EEG is requested to look for epileptiform abnormalities.    MEDICATION:  Keppra.    REPORT:  This is a digitally acquired video EEG performed with the standard 10-20 system of electrode placement.   Awake background was characterized by generalized alpha admixed with beta and theta activity with a 9 Hz posterior dominant rhythm.   The background was continuous but asymmetric due to continuous attenuation over the left temporal region.   Sleep was characterized by symmetric sleep spindles.   Hyperventilation was not performed.  Photic stimulation did not elicit any abnormalities.   No clinical events were marked.    ABNORMAL ACTIVITY:  No seizures or epileptiform discharges were seen.    INTERPRETATION:  Continuous attenuation over the left temporal region suggesting underlying cortical dysfunction versus extra-axial fluid collection.   Occasional brief left temporal rhythmic delta activity at 1 to 1.5 Hz suggesting seizure potential in this region.   No seizures were recorded.      Olean Ree MD  Assistant Professor, Neurology  [facility]            CC:   Addison Naegeli, MD   [address]   [place], [place] [address]       DD:  06/29/2020 14:45:51  DT:  06/29/2020 14:56:40 DG  D#:  [id number]

## 2020-06-29 NOTE — Care Plan (Signed)
Assessment  Carrie Escobar is a 74 y.o., female, admitted for brain mass. Of note, she has a hx of PE.    This patient would benefit from acute physical therapy services to address: deficits in functional mobility skills, functional transfer independence, bed mobility skills, functional strength, balance, and functional activity tolerance compared to baseline/previous level of function .The patient completed bed mobility skills with CGA, transfers with CGA, and gait training with Min Ax2. The patient ambulated 4 feet with LRAD of 2ww and gait deficits of knee flexion at stance and decreased step length. She had two LOB in posterior left direction that needed Min A to correct and maintain COG over BOS. Patient positioned comfortably in bed at end of session with no acute distress or concerns voiced, all immediate needs met/addressed, and call button within reach. Alarm active. PT recommends d/c to SNF. DME needs: pending further assessments.        Rehab potential:  Fair    Goals:     Learning goals: Gait/stairs, transfers/ther act, precautions/safety, balance, ther ex          Physical therapy SHORT TERM goals:    1. Supine <> sit with SBA  2. Sit <> stand with SBA  3. Bed <> chair with SBA  4. Gait > 40 feet with no LOB, 2ww, and CGAx1-2  The above is to be completed within 1 week        Physical therapy LONG TERM goals:    1. Gait > 90 feet with no AD and SBA  2. 100% compliance with HEP    The above is to be completed within 2 weeks    Patient and/or family Goals/Expectations: get stronger - Realistic     Plan:       Patient to be seen: PT 1-2 times per day for 5-6 days per week      PT to see patient for: Gait/stairs, transfers/ther act, precautions/safety, balance, ther ex     PT Recommendations for Nursing: Up with assistance only, very impulsive   PT Recommendations for Patient/Family: Call for assistance when getting OOB    Additional information:     Anticipated rehab needs at discharge: SNF      Patient has been advised of PT diagnosis, goals and plan: Yes     Patient/family has given verbal consent for eval, treatment: and plan of care: Yes    Is the patient appropriate for skilled PT at this time? Yes    Enedina Finner, PT

## 2020-06-29 NOTE — Care Plan (Signed)
Problem: Adult Inpatient Plan of Care  Goal: Rounds/Family Conference  Flowsheets (Taken 06/29/2020 1624)  Participants:   case manager   family    I met with the patient. She seemed to have some confusion at time, but was able to answer some of the questions.  I discussed capacity with Dr. Darnelle Maffucci, who informed me the patient is incapacitated. The brother had brought in Carrie Escobar paperwork and I requested a capacity form be completed.  I called the brother, MPOA, Carrie Escobar.  She has been living with his brother, but he is moving out this Friday. Her PCP is Dr. Tempie Donning. She uses [facility]. She doesn't have any medical equipment at home.  His sister [name] helps her. The patient had mentioned [name] helps her, but [name] informed me she has been deceased for almost 2 years. PT/OT is recommending SNF. I informed [name], MPOA, the hospital is requiring all SNF referrals go statewide in [place]. He is agreeable to the statewide referral and a PAS. He declined MD and VA at this time. She is scheduled for surgery and will need reassessed for the plan.

## 2020-06-29 NOTE — OT Evaluation (Signed)
Carrie Escobar  Occupational Therapy Initial Evaluation - Inpatient      Patient Name: Carrie Escobar  Date of Birth: 11-14-1945  Height: 165.1 cm (5\' 5" )  Weight: 65 kg (143 lb 4.8 oz)  Room/Bed: 404/B  Payor: Carrie Escobar MEDICARE / Plan: [employer] [facility] [employer] MEDICARE ADVANTAGE PPO / Product Type: PPO /     Encounter Date: 06/27/2020 11:35 AM  Inpatient Admission Date: 06/27/2020    Admitting Diagnosis:  Brain mass [G93.89]  Patient Active Problem List   Diagnosis   . Shoulder pain, right   . Lipoma of shoulder   . Rotator cuff syndrome of right shoulder   . Senile nuclear sclerosis   . Routine general medical examination at a health care facility   . Dependent edema   . Atypical chest pain   . Trigger finger of right hand   . Brain mass   . Memory impairment   . Acute metabolic encephalopathy       Carrie Escobar is a 74 y.o., female, admitted with brain mass. 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 Lymphoma is another consideration.    Past Medical Hx:    Past Medical History:   Diagnosis Date   . Breast mass, right     benign   . Cancer (CMS [place])    . Cataract     bilat   . Family history of colon cancer    . Heartburn    . HTN (hypertension)    . Nausea with vomiting     sister with PONV   . Obesity    . Pulmonary emboli     only once in 1999 after her hysterectomy   . Wears glasses     reading         Past Carrie Hx:    Past Carrie History:   Procedure Laterality Date   . 56213 - INJ SINGLE TENDON SHEATH/LIGAMENT, APONEUROSIS W/ [place] INTERP ONLY (AMB ONLY)  02/10/2016   . HX BREAST BIOPSY Right     BENIGN    . HX CATARACT REMOVAL  10/2013    left   . HX HYSTERECTOMY  1999    fibroid   . HX ROTATOR CUFF REPAIR  11/04/2013    right shoulder         Past Social Hx:       Social History     Social History Narrative   . Not on file       Subjective/Objective:      [phone] [id number]   History Minimal  review of history related to current functional performance Expanded review of physical, cognitive or psychosocial history related to current functional performance Extensive review of physical, cognitive or psychosocial history related to current functional performance   Examination of Body Systems Addressing 1-3 performance deficits Addressing a total of 3-5 performance deficits Addressing a total of 5 or more performance deficits   Clinical Presentation Modification of tasks not necessary to complete the evaluation Min-moderate modification of tasks or assistance necessary to complete the evaluation Significant modification of tasks or assistance necessary to complete the evaluation   Typical Face-to-Face Time (minutes) 30 45 60   Clinical Decision Making (Complexity) Low Moderate High       SUBJECTIVE: Patient stated, "I'm feeling pretty good." Patient was agreeable to OT services and denied pain. She was pleasantly confused.      Pain Scale (0-10):  0/10    PRECAUTIONS: Fall, cognitive barriers to learning    PRIOR LEVEL OF FUNCTION:  Patient was a poor historian so unsure of accuracy. She stated she lives with her  brother in a one story home with no steps for entry. 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 Patient reports independence with ambulation without and was independent with all ADLs/IADLs including driving.     ADAPTIVE EQUIPMENT/DURABLE MEDICAL EQUIPMENT:  None reported    COMMUNICATION SKILLS:  Fair--unable to accurately make needs known; demo'd use of call bell x 1 with max verbal cues    COGNITION: Alert to person only, poor safety awareness; patient unable to consistently follow simple one step commands and required max verbal/visual demo to follow    ADL SKILLS:    UE Bathing: CGA x 1  UE Dressing: CGA x 1  LE Bathing: Max assist x 1  LE Dressing: Max assist x 1  Hygiene: Max assist x 1    LEISURE INTERESTS / SKILLS:  None reported      FUNCTIONAL MOBILITY:   Bed Mobility: CGA  x 1   Sit - Stand: Min assist x 2   Bed - Chair / Toilet: Min assist x 2   Comments: Patient transferred from supine to sit at EOB with CGA x 1. She stood at [facility] with min assist x 2. Patient noted to lean posteriorly and to the left in standing position. Patient ambulated a total of 4' with RW with min assist x 2. She returned to supine with CGA x 1 and was positioned for comfort with bed alarm engaged and call bell in reach.     BALANCE:   Sitting:  Static: Good   Dynamic: Good     Standing: Static:   Fair-   Dynamic: Fair-    ROM:   RUE: WNL   LUE: WNL    STRENGTH:   RUE: Grossly 4/5   LUE: Grossly 4/5  Comments: Patient was noted to have difficulty transitioning from one motor movement to another during ROM/strength testing (i.e. she kept her fists in composite flexed position after testing grip strength despite cues from therapists to relax them)        ENDURANCE / ACTIVITY TOLERANCE: Fair    TREATMENT PLAN / FREQUENCY: O.T.  3-5x/week    D/C PLANS:  SNF                             Assessment:   Patient is a 74 year old female with dx of brain mass. Patient has UB ROM/strength WFL. Patient was pleasantly confused and unable to consistently follow simple one step verbal instructions. She was oriented to person only and has poor safety awareness. She required max verbal cues and visual demonstration by OT to follow directions for the assessment.  Patient demonstrates with a decline in independence in daily activities and functional mobility. Patient was CGA x 1 for bed mobility and was min assist x 2 for functional transfers. She had fair- static/dynamic standing balance, but dynamic sitting balance was good.  Patient was CGA x 1 for UB ADLs and max assist x 1 for LB ADLs.  Recommend OT for ADL retraining, UB strength/endurance, home safety training, and functional transfer training. SNF is recommended at d/c.            Goals:   X3-5 VISITS:  1. Pt. to don socks with sock-aid  with min assist x 1.  2. Pt. to don  pants with min assist x 1.  3. Pt. to perform toilet transfer with min. assist with walker and BSC  4. Pt. To be oriented to date in 2/3 visits using compensatory strategies.  5. Pt. to perform HEP for BUE independently using 2# weight (2x10).    AT DISCHARGE:  1. Pt. to be CGA x 1  with UE/LE ADLs/IADLs.  2. Pt. to be CGA x 1 with functional mobility with walker.  3. Pt. to demonstrate use of compensatory strategies for orientation 75% of the time.   4. Pt. to be supervision with HEP for BUE strength/endurance.        Plan:     TREATMENT PLAN/FREQUENCY:  O.T. 3-5 times per week     OT Recommendations for Nursing:  Assist with ADLs  OT Recommendations for Patient/Family:  Assist with ADLs    Follow patient as appropriate according to established plan of care, progressing as tolerated.     The risks/benefits of therapy have been discussed with the patient and he/she is in agreement with the established plan of care.     Therapist :       Marveen Reeks, OT  Pager number: [phone]    Total Evaluation Time:  30 minutes    Time may include review of chart notes, obtaining patient's functional history from patient/family/medical staff/case management/ancillary personnel, collaboration on findings and treatment options (with the above mentioned individuals), re-assessment, and acute care rehabilitation.

## 2020-06-29 NOTE — Care Plan (Signed)
Problem: Adult Inpatient Plan of Care  Goal: Plan of Care Review  Outcome: Ongoing (see interventions/notes)  Goal: Patient-Specific Goal (Individualized)  Outcome: Ongoing (see interventions/notes)  Goal: Absence of Hospital-Acquired Illness or Injury  Outcome: Ongoing (see interventions/notes)  Goal: Optimal Comfort and Wellbeing  Outcome: Ongoing (see interventions/notes)  Goal: Rounds/Family Conference  Outcome: Ongoing (see interventions/notes)     Problem: Seizure, Active Management  Goal: Absence of Seizure/Seizure-Related Injury  Outcome: Ongoing (see interventions/notes)     Problem: Diabetes Comorbidity  Goal: Blood Glucose Level Within Targeted Range  Outcome: Ongoing (see interventions/notes)     Problem: Cognitive Impairment  Goal: Optimal Functional Independence  Outcome: Ongoing (see interventions/notes)     Problem: Fatigue (Oncology Care)  Goal: Improved Activity Tolerance  Outcome: Ongoing (see interventions/notes)     Problem: Skin Injury Risk Increased  Goal: Skin Health and Integrity  Outcome: Ongoing (see interventions/notes)     Problem: Acute Neurologic Deterioration  Goal: Absence of Acute Neurologic Symptoms  Outcome: Ongoing (see interventions/notes)     [facility] Plan Note    Patient here for brain mass. EEG done today-no seizures noted. US thyroid done-nodule on left noted. FSBS today were 136 at noon and 154 at dinner-refused to have morning one done. Telemetry 31 SB 53/ SR 60/ SR 61. Seen by Dr Geraldo Docker today and M.Maser PA- neurosurgery as well. Left temporal brain biopsy planned for Thursday per note. Neuro checks remain unchanged. Oriented x1-very pleasant and appreciative of care. Dozes off and on. Incontinent at times-brief applied-sometimes able to use bed pan. Worked with PT-walked 4' with them as per their note. High fall risk so fall protocols in place.

## 2020-06-29 NOTE — Progress Notes (Signed)
[facility]  NEUROSURGERY   PROGRESS NOTE      Carrie Escobar, Carrie Escobar, 74 y.o. female  Date of Admission:  06/27/2020  Date of Service: 06/29/2020  Date of Birth:  1946-01-15    Referring Physician:  No ref. provider found    Chief Complaint: Brain mass  Subjective: Carrie Escobar is a 74 y.o., right handed 64 American female who presents with progressive confusion, memory loss, and forgetfulness over the last month or so.  The patient reports that she has had gradual worsening of her memory with inability to remember close family members names as well as daily activities.  She also reports an unexpected weight loss of approximately 10 lb over the last month or so.  Overall she is doing well, has no pain today.  States she has some blurry vision worse in the right eye, but no other symptoms.  Denies headache, visual obscurations, double vision, nausea, or dizziness.    Vital Signs:  Temp (24hrs) Max:36.7 C (37.0 F)      Systolic (48GQB), VQX:450 , Min:91 , TUU:828     Diastolic (00LKJ), ZPH:15, Min:53, Max:75    Temp  Avg: 36.2 C (97.1 F)  Min: 35.9 C (96.6 F)  Max: 36.7 C (98.1 F)  MAP (Non-Invasive)  Avg: 79.4 mmHG  Min: 72 mmHG  Max: 88 mmHG  Pulse  Avg: 59.6  Min: 51  Max: 80  Resp  Avg: 18.3  Min: 16  Max: 22  SpO2  Avg: 97.9 %  Min: 93 %  Max: 100 %       Min/Max/Avg ICP/CPP last 24hrs:   No data recorded    Today's Physical Exam:  Constitutional: no distress  Eyes: Conjunctiva clear.  ENT: ENMT without erythema or injection, mucous membranes moist.  Neck: supple, symmetrical, trachea midline  Respiratory: Clear to auscultation bilaterally.   Cardiovascular: regular rate and rhythm  Gastrointestinal: Soft, non-tender  Musculoskeletal: 5/5 in both upper and lower extremities  Integumentary:  No rashes  Neurologic: CN II - XII grossly intact , Mental status: alert, oriented, thought content appropriate and A&O x1  Lymphatic/Immunologic/Hematologic: No  lymphadenopathy  Psychiatric: Normal  Patient is confused and has some difficulty following commands.      Current Medications:  amLODIPine (NORVASC) tablet, 10 mg, Oral, Daily  cefTRIAXone (ROCEPHIN) 2 g in NS 50 mL IVPB minibag, 2 g, Intravenous, Q24H  dexamethasone 4 mg/mL injection, 4 mg, Intravenous, Q6H  fluticasone (FLONASE) 50 mcg per spray nasal spray, 1 Spray, Each Nostril, Daily  levETIRAcetam (KEPPRA) 1000 mg in iso-osmotic 100 mL premix IVPB, 1,000 mg, Intravenous, Q12H  LORazepam (ATIVAN) 2 mg/mL injection, 2 mg, Intravenous, Q4H PRN  [Held by provider] metFORMIN (GLUCOPHAGE XR) extended release tablet, 500 mg, Oral, Daily  SSIP insulin lispro (HUMALOG) 100 units/mL SubQ pen, 1-12 Units, Subcutaneous, 4x/day AC  timolol (TIMOPTIC) 0.25% ophthalmic solution, 1 Drop, Right Eye, 2x/day        I/O:  I/O last 24 hours:    Intake/Output Summary (Last 24 hours) at 06/29/2020 1652  Last data filed at 06/28/2020 2200  Gross per 24 hour   Intake 240 ml   Output --   Net 240 ml     I/O current shift:  No intake/output data recorded.    Antibiotics: Date Started Date Completed   1.     2.     3.     4.       Nutrition/Residuals:  DIET CARDIAC Supplement: Ensure  High Protein; Ensure HP once daily with lunch    Labs  Please indicate ordered or reviewed)  Reviewed:   Lab Results for Last 24 Hours:    Results for orders placed or performed during the hospital encounter of 06/27/20 (from the past 24 hour(s))   POC FINGERSTICK GLUCOSE - [facility] (RESULTS)   Result Value Ref Range    GLUCOSE, POC 177 (H) 60 - 100 mg/dl   POC FINGERSTICK GLUCOSE - [facility] (RESULTS)   Result Value Ref Range    GLUCOSE, POC 164 (H) 60 - 100 mg/dl   BASIC METABOLIC PANEL - AM ONCE   Result Value Ref Range    SODIUM 141 136 - 145 mmol/L    POTASSIUM 3.6 3.5 - 5.1 mmol/L    CHLORIDE 105 96 - 111 mmol/L    CO2 TOTAL 26 23 - 31 mmol/L    ANION GAP 10 4 - 13 mmol/L    CALCIUM 9.9 8.8 - 10.2 mg/dL    GLUCOSE 139 (H) 65 - 125 mg/dL    BUN 15 8 - 25  mg/dL    CREATININE 0.83 0.60 - 1.05 mg/dL    BUN/CREA RATIO 18 6 - 22    ESTIMATED GFR 82 >=60 mL/min/BSA   CBC WITH DIFF   Result Value Ref Range    WBC 24.8 (H) 4.0 - 11.0 x103/uL    RBC 4.46 4.00 - 5.10 x106/uL    HGB 12.9 12.0 - 15.5 g/dL    HCT 37.7 36.0 - 45.0 %    MCV 84.5 82.0 - 97.0 fL    MCH 29.0 27.5 - 33.2 pg    MCHC 34.3 32.0 - 36.0 g/dL    RDW 14.1 11.0 - 16.0 %    PLATELETS 227 150 - 450 x103/uL    MPV 8.7 7.4 - 10.5 fL    NEUTROPHIL % 94 (H) 43 - 76 %    LYMPHOCYTE % 4 (L) 15 - 43 %    MONOCYTE % 2 (L) 5 - 12 %    EOSINOPHIL % 0 0 - 5 %    BASOPHIL % 0 0 - 3 %    NEUTROPHIL # 23.30 (H) 1.50 - 6.50 x103/uL    LYMPHOCYTE # 1.00 1.00 - 4.80 x103/uL    MONOCYTE # 0.50 0.20 - 0.90 x103/uL    EOSINOPHIL # 0.00 0.00 - 0.50 x103/uL    BASOPHIL # 0.00 0.00 - 0.10 x103/uL   POC FINGERSTICK GLUCOSE - [facility] (RESULTS)   Result Value Ref Range    GLUCOSE, POC 136 (H) 60 - 100 mg/dl   POC FINGERSTICK GLUCOSE - [facility] (RESULTS)   Result Value Ref Range    GLUCOSE, POC 154 (H) 60 - 100 mg/dl     Ordered:  Pre-op lab work    Diagnostic Tests (Please indicate ordered or reviewed)  Reviewed: None  Ordered:  None    Radiology Tests (Please indicate ordered or reviewed)  Reviewed: MRI:   Direct visualization of the image on 06/29/2020 on [employer] PACS showed:  Multiple enhancing masses in the left parietal, temporal, and occipital lobes which are connected by masslike T2 FLAIR signal abnormality.  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  Largest mass extends along the ependymal lining of left lateral ventricle.  Ordered:  None    Assessment/ Plan:  Active Hospital Problems   (*Primary Problem)    Diagnosis    *Brain mass    Memory impairment    Acute metabolic encephalopathy  Patient/ Family Discussion:   Ms. Carrie Escobar is a pleasant 74 year old right-handed female with newly diagnosed multiple brain lesions.  The patient has had progressive issues with memory as well as general confusion.  She  is currently A&O x1.  Otherwise is neurologically intact.  MRI reveals what appears to be multifocal glioblastoma.  However could be lymphoma versus metastases well.  To establish diagnosis and proper treatment plan, we are proceeding with a left temporal brain biopsy.  This will be done on Thursday morning.  Risks of the procedure were explained to the patient and family including but not limited to bleeding, infection, blood clot, stroke, wound healing problems, CSF leak, seizures.  Patient is to hold off the aspirin until then.  The family was notified and had a discussion about this.  Patient will likely be able to be discharged on Friday and will need to be transferred to a care facility.    I personally saw and evaluated the patient. See mid-level's note for additional details.     Particia Jasper, MD

## 2020-06-29 NOTE — Care Plan (Signed)
Assessment:   Patient is a 74 year old female with dx of brain mass. Patient has UB ROM/strength WFL. Patient was pleasantly confused and unable to consistently follow simple one step verbal instructions. She was oriented to person only and has poor safety awareness. She required max verbal cues and visual demonstration by OT to follow directions for the assessment.  Patient demonstrates with a decline in independence in daily activities and functional mobility. Patient was CGA x 1 for bed mobility and was min assist x 2 for functional transfers. She had fair- static/dynamic standing balance, but dynamic sitting balance was good.  Patient was CGA x 1 for UB ADLs and max assist x 1 for LB ADLs.  Recommend OT for ADL retraining, UB strength/endurance, home safety training, and functional transfer training. SNF is recommended at d/c.            Goals:   X3-5 VISITS:  1. Pt. to don socks with sock-aid with min assist x 1.  2. Pt. to don pants with min assist x 1.  3. Pt. to perform toilet transfer with min. assist with walker and BSC  4. Pt. To be oriented to date in 2/3 visits using compensatory strategies.  5. Pt. to perform HEP for BUE independently using 2# weight (2x10).    AT DISCHARGE:  1. Pt. to be CGA x 1  with UE/LE ADLs/IADLs.  2. Pt. to be CGA x 1 with functional mobility with walker.  3. Pt. to demonstrate use of compensatory strategies for orientation 75% of the time.   4. Pt. to be supervision with HEP for BUE strength/endurance.        Plan:     TREATMENT PLAN/FREQUENCY:  O.T. 3-5 times per week     OT Recommendations for Nursing:  Assist with ADLs  OT Recommendations for Patient/Family:  Assist with ADLs    Follow patient as appropriate according to established plan of care, progressing as tolerated.     The risks/benefits of therapy have been discussed with the patient and he/she is in agreement with the established plan of care.     Therapist :       Marveen Reeks, OT  Pager number:  [phone]    Total Evaluation Time:  30 minutes    Time may include review of chart notes, obtaining patient's functional history from patient/family/medical staff/case management/ancillary personnel, collaboration on findings and treatment options (with the above mentioned individuals), re-assessment, and acute care rehabilitation.

## 2020-06-29 NOTE — Care Management Notes (Signed)
I met with the patient. She seemed to have some confusion at time, but was able to answer some of the questions.  I discussed capacity with Dr. Darnelle Maffucci, who informed me the patient is incapacitated. The brother had brought in [place] paperwork and I requested a capacity form be completed.  I called the brother, MPOA, Clariza Sickman.  She has been living with his brother, but he is moving out this Friday. Her PCP is Dr. Tempie Donning. She uses [facility]. She doesn't have any medical equipment at home.  His sister [name] helps her. The patient had mentioned [name] helps her, but [name] informed me she has been deceased for almost 2 years. PT/OT is recommending SNF. I informed [name], MPOA, the hospital is requiring all SNF referrals go statewide in [place]. He is agreeable to the statewide referral and a PAS. He declined MD and [facility] at this time. She is scheduled for surgery and will need reassessed for the plan.      06/29/20 1547   Assessment Detail   Assessment Type Admission   Date of Care Management Update 06/29/20   Date of Next DCP Update 06/30/20   Readmission   Is this a readmission? No   Social Work Animal nutritionist Status initial meeting   Projected Discharge Date 07/01/20   Anticipated Discharge Disposition   (May need SNF)   Patient choice offered to patient/family yes   Form for patient choice reviewed/signed and on chart yes   Discharge Needs Assessment   Readmission Within the Last 30 Days no previous admission in last 30 days   Equipment Currently Used at Home none   Equipment Needed After Discharge none   Transportation Available family or friend will provide   Referral Information   Admission Type inpatient   Arrived From home or self-care   Address Verified verified-no changes   Insurance Verified verified-no change   Source of Information Family (specify)  [name])   Referral Source patient   Reason for Consult discharge [place] With sibling(s)    Living Arrangements house

## 2020-06-29 NOTE — Ancillary Notes (Signed)
Neurodiagnostic & Sleep Lab  [phone]        Neurodiagnostic Lab Note       EEG completed.        Carrie Escobar, [facility]  06/29/2020, 13:40

## 2020-06-29 NOTE — PT Evaluation (Signed)
[place], [place] [address]  Rehabilitation Services  Physical Therapy Initial Evaluation        Patient Name: Carrie Escobar  Date of Birth: 02-May-1946  Height: 165.1 cm (5' 5")  Weight: 65 kg (143 lb 4.8 oz)  Room/Bed: 404/B  Payor: [employer] MEDICARE / Plan: [place] MEDICARE ADVANTAGE PPO / Product Type: PPO /     Encounter Start Date:  06/27/2020  Inpatient Admission Date: 06/27/2020    Patient Active Problem List   Diagnosis   . Shoulder pain, right   . Lipoma of shoulder   . Rotator cuff syndrome of right shoulder   . Senile nuclear sclerosis   . Routine general medical examination at a health care facility   . Dependent edema   . Atypical chest pain   . Trigger finger of right hand   . Brain mass   . Memory impairment   . Acute metabolic encephalopathy     Carrie Escobar is a 74 y.o., female, admitted for brain mass. Of note, she has a hx of PE.     Past Medical Hx:    Past Medical History:   Diagnosis Date   . Breast mass, right     benign   . Cancer (CMS [place])    . Cataract     bilat   . Family history of colon cancer    . Heartburn    . HTN (hypertension)    . Nausea with vomiting     sister with PONV   . Obesity    . Pulmonary emboli     only once in 1999 after her hysterectomy   . Wears glasses     reading     Past Surgical Hx:    Past Surgical History:   Procedure Laterality Date   . 05397 - INJ SINGLE TENDON SHEATH/LIGAMENT, APONEUROSIS W/ [place] INTERP ONLY (AMB ONLY)  02/10/2016   . HX BREAST BIOPSY Right     BENIGN    . HX CATARACT REMOVAL  10/2013    left   . HX HYSTERECTOMY  1999    fibroid   . HX ROTATOR CUFF REPAIR  11/04/2013    right shoulder         Past Social Hx:       Social History     Social History Narrative   . Not on file     Patient Information     Precautions:  Standard, Universal, Falls, Monitor Vitals, CA, AMS, PE hx, Seizure precautions, aspiration precautions     Barriers to learning:  AMS, impulsive        Weight bearing status;     N/A    Prior level of  function     Lives in: one story home, no ste      Lives with: brother for whom she is the CG     DME: none     PLOF: IND with ADLs and mobility - primary CG for her brother     Subjective:     Numeric pain scale: 0 out of 10     Comments:  "feeling better today!"    Objective:     Cognition: A&Ox2     Communication ability: Fair, confusion throughout - difficulty following simple instructions unless repeated about x3        Range of Motion     RLE ROM WFL?  yes     LLE ROM WFL?  yes  BUE ROM WFL? yes       Strength     RLE strength WFL?  Grossly 4- out of 5     LLE strength WFL?  Grossly 4- out of 5        BUE strength WFL? yes       Sensation: grossly intact      Skin integrity/edema: unremarkable    Functional Ability     Assistive devices used with bed mobility:  Rail, mechanical controls     Rolling:  IND     Supine <> Sit: CGA     Static sitting balance:  Good     Dynamic sitting balance: Fair      Assistive devices used with transfers:  2ww     Sit <> stand: CGA to Min A     Static standing balance:  Fair     Dynamic standing balance: Fair -    Ambulation     Ambulation surface: Level, Even      Ambulation ability: Min Ax2 for 4 feet     Assistive devices:  2ww     Ability to maintain weight bearing status:  Good     Ambulation tolerance:  Fair      Stairs    DNT     [phone] [id number] [phone]   History No personal factors or co-morbidities 1-2 personal factors or co-morbidities 3 or more personal factors or co-morbidities   Examination of Body Systems Addressing 1-2 elements Addressing a total of 3 or more elements Addressing a total of 4 or more elements   Clinical Presentation Stable Evolving Unstable   Typical Face-to-Face Time (minutes) 20 30 45   Clinical Decision Making (Complexity) Low Moderate High     Assessment  Carrie Escobar is a 74 y.o., female, admitted for brain mass. Of note, she has a hx of PE.    This patient would benefit from acute physical therapy services to address: deficits in  functional mobility skills, functional transfer independence, bed mobility skills, functional strength, balance, and functional activity tolerance compared to baseline/previous level of function .The patient completed bed mobility skills with CGA, transfers with CGA, and gait training with Min Ax2. The patient ambulated 4 feet with LRAD of 2ww and gait deficits of knee flexion at stance and decreased step length. She had two LOB in posterior left direction that needed Min A to correct and maintain COG over BOS. Patient positioned comfortably in bed at end of session with no acute distress or concerns voiced, all immediate needs met/addressed, and call button within reach. Alarm active. PT recommends d/c to SNF. DME needs: pending further assessments.        Rehab potential:  Fair    Goals:     Learning goals: Gait/stairs, transfers/ther act, precautions/safety, balance, ther ex          Physical therapy SHORT TERM goals:    1. Supine <> sit with SBA  2. Sit <> stand with SBA  3. Bed <> chair with SBA  4. Gait > 40 feet with no LOB, 2ww, and CGAx1-2  The above is to be completed within 1 week        Physical therapy LONG TERM goals:    1. Gait > 90 feet with no AD and SBA  2. 100% compliance with HEP    The above is to be completed within 2 weeks    Patient and/or family Goals/Expectations: get stronger Best boy  Plan:       Patient to be seen: PT 1-2 times per day for 5-6 days per week      PT to see patient for: Gait/stairs, transfers/ther act, precautions/safety, balance, ther ex     PT Recommendations for Nursing: Up with assistance only, very impulsive   PT Recommendations for Patient/Family: Call for assistance when getting OOB    Additional information:     Anticipated rehab needs at discharge: SNF     Patient has been advised of PT diagnosis, goals and plan: Yes     Patient/family has given verbal consent for eval, treatment: and plan of care: Yes    Is the patient appropriate for skilled PT at this  time? Yes    Enedina Finner, PT

## 2020-06-29 NOTE — Progress Notes (Signed)
[facility]  [place], [place] [address]    INPATIENT PROGRESS NOTE      Jhordyn, Hoopingarner  Date of Admission:  06/27/2020  Date of Birth:  [birth year]  Date of Service:  06/29/2020         Assessment/ Plan:       Intracranial masses:  Multi focal. Concerning for glioblastoma v Lymphoma v Mets. Associated with weakness, memory impairment and lethargy. CT chest/Abd/Plv only showing a thyroid mass. [place] of thyroid ordered. Continue to monitor in ICU. Patient will need biopsy of 1 of these masses for tissue diagnosis. Has to be off ASA for at least 5 days. Biopsy planned tentatively for Thursday. Continue on prophylactic Keppra IV. Continue Decadron. Continue  Neuro checks.  Appreciate recommendation from Neurology, Neurosurgery, and Oncology. Falls, seizure, and aspiration precautions. Supportive care as needed.      Memory impairment: Subacute. Ongoing for several weeks. Worsening.  Likely due to above diagnosis. Treat as outlined above. Further evaluation diagnosis as outlined above.      Lethargy: Subacute.  May be emanating from above diagnosis.  Treat as outlined above. PT OT evaluation and treatment.  Fall precautions.      Acute metabolic encephalopathy:  Mild to moderate.  Likely due to #1 diagnosis.  Treat as outlined above.  Neuro checks.  Supportive care.        Hypertension: Benign. Shy of goal. Resume oral regimen. Adjust as needed.       Diabetes mellitus: Type 2. Patient on Metformin. Will continue same. Sliding scale insulin coverage as needed. Fingersticks.        Prophylaxis:  SCD      Disposition:  Home when workup and treatment are completed.          Chief Complaint:  And a full vest printed.  Masses, sleepiness, forgetfulness, fatigue.      Subjective: Pt seen. See Above.      ROS: Denies fever/chills. No chest pain/SOB. No abdominal pain, nausea, or vomiting.         Today's Physical Exam:  BP 124/64   Pulse 51   Temp 35.9 C (96.6 F)   Resp 18   Ht 1.651 m (5'  5")   Wt 65 kg (143 lb 4.8 oz)   SpO2 98%   BMI 23.85 kg/m       General: AAOx3, Memory recall impediment, No acute distress.   Eyes: Pupils equal and round, reactive to light and accomodation.   HENT:Head atraumatic and normocephalic   Neck: No JVD or thyromegaly or lymphadenopathy   Lungs: Clear to auscultation bilaterally.   Cardiovascular: regular rate and rhythm, S1, S2 normal, no murmur,   Abdomen: Soft, non-tender, Bowel sounds normal, No hepatosplenomegaly   Extremities: extremities normal, atraumatic, no cyanosis or edema   Skin: Skin warm and dry   Neurologic: Unable to recall events  Lymphatics: No lymphadenopathy   Psychiatric: Normal affect and behavior.    Current Medications:  amLODIPine (NORVASC) tablet, 10 mg, Oral, Daily  cefTRIAXone (ROCEPHIN) 2 g in NS 50 mL IVPB minibag, 2 g, Intravenous, Q24H  dexamethasone 4 mg/mL injection, 4 mg, Intravenous, Q6H  fluticasone (FLONASE) 50 mcg per spray nasal spray, 1 Spray, Each Nostril, Daily  levETIRAcetam (KEPPRA) 1000 mg in iso-osmotic 100 mL premix IVPB, 1,000 mg, Intravenous, Q12H  LORazepam (ATIVAN) 2 mg/mL injection, 2 mg, Intravenous, Q4H PRN  [Held by provider] metFORMIN (GLUCOPHAGE XR) extended release tablet, 500 mg, Oral, Daily  SSIP  insulin lispro (HUMALOG) 100 units/mL SubQ pen, 1-12 Units, Subcutaneous, 4x/day AC  timolol (TIMOPTIC) 0.25% ophthalmic solution, 1 Drop, Right Eye, 2x/day          I/O:  I/O last 24 hours:      Intake/Output Summary (Last 24 hours) at 06/29/2020 0927  Last data filed at 06/28/2020 2200  Gross per 24 hour   Intake 240 ml   Output -   Net 240 ml     I/O current shift:  No intake/output data recorded.      Laboratory Results:    Reviewed:   Results for orders placed or performed during the hospital encounter of 06/27/20 (from the past 24 hour(s))   POC FINGERSTICK GLUCOSE - [facility] (RESULTS)   Result Value Ref Range    GLUCOSE, POC 182 (H) 60 - 100 mg/dl   POC FINGERSTICK GLUCOSE - [facility] (RESULTS)   Result Value  Ref Range    GLUCOSE, POC 177 (H) 60 - 100 mg/dl   POC FINGERSTICK GLUCOSE - [facility] (RESULTS)   Result Value Ref Range    GLUCOSE, POC 164 (H) 60 - 100 mg/dl   CBC WITH DIFF   Result Value Ref Range    WBC 24.8 (H) 4.0 - 11.0 x10^3/uL    RBC 4.46 4.00 - 5.10 x10^6/uL    HGB 12.9 12.0 - 15.5 g/dL    HCT 37.7 36.0 - 45.0 %    MCV 84.5 82.0 - 97.0 fL    MCH 29.0 27.5 - 33.2 pg    MCHC 34.3 32.0 - 36.0 g/dL    RDW 14.1 11.0 - 16.0 %    PLATELETS 227 150 - 450 x10^3/uL    MPV 8.7 7.4 - 10.5 fL    NEUTROPHIL % 94 (H) 43 - 76 %    LYMPHOCYTE % 4 (L) 15 - 43 %    MONOCYTE % 2 (L) 5 - 12 %    EOSINOPHIL % 0 0 - 5 %    BASOPHIL % 0 0 - 3 %    NEUTROPHIL # 23.30 (H) 1.50 - 6.50 x10^3/uL    LYMPHOCYTE # 1.00 1.00 - 4.80 x10^3/uL    MONOCYTE # 0.50 0.20 - 0.90 x10^3/uL    EOSINOPHIL # 0.00 0.00 - 0.50 x10^3/uL    BASOPHIL # 0.00 0.00 - 0.10 x10^3/uL     Ordered:    Lab orders   No lab orders         Radiological Studies:  Reviewed:   Results for orders placed or performed during the hospital encounter of 06/27/20 (from the past 24 hour(s))   CT CHEST ABDOMEN PELVIS W IV CONTRAST     Status: None    Narrative    RADIOLOGIST: Austin Miles, MD    EXAMINATION: CT CHEST ABDOMEN PELVIS W IV CONTRAST     CLINICAL INDICATION: brain masses on MRI - primary source of metastasis?    TECHNIQUE: Standard technique was utilized. Axial plane images were  obtained. Multiplanar image reconstruction was performed.    COMPARISON: CT scan from April 2017      FINDINGS:  10 mm nodule in the left thyroid lobe. There is a 6 mm peripheral, density  in the left lower lobe, with adjacent linear density. This appears  unchanged since 2017. No pleural effusions. No mediastinal or hilar  adenopathy. No axillary adenopathy.    The liver, gallbladder, spleen, pancreas, adrenal glands are normal. There  is a cyst in the right kidney. Bladder is  normal. There is mild diffuse  thickening of the transverse, descending, sigmoid colon and rectum. There  is no  ascites or adenopathy. Mild degenerative changes of the lower lumbar  spine. No aggressive osseous lesions are seen.      Impression    1.10 mm nodule in the left thyroid lobe. Recommend thyroid ultrasound  and appropriate follow-up.  2.There is a 6 mm pleural-based peripheral density in the left lower  lobe of the lung, with adjacent linear density. This appears unchanged  since 2017 and likely represents scarring.  3.Mild diffuse thickening of the colon and rectum, suspicious for  colitis.        MIPS PERFORMANCE METRICS:  1. Dose reduction technique used. Automatic exposure control (AEC) was  applied.  2. Count of high-dose radiation studies (CT and cardiac and NM) last 12  months:  1.  3. DLP: 1086.10 mGy.cm  4. CONTRAST: 80 ml of                      Radiologist location ID: [id number]     MRI BRAIN STEALTH W/WO CONTRAST     Status: None    Narrative    [name]  Female, 74 years old.    MRI BRAIN STEALTH W/WO CONTRAST performed on 06/28/2020 3:51 PM.    REASON FOR EXAM:  Pre-op planning      INTRAVENOUS CONTRAST: 13 ml's of Prohance  CREATININE/GFR:    TECHNIQUE: Multiplanar multisequence MRI of the brain with and without  contrast with stealth protocol    COMPARISON: Brain MRI June 27, 2020    FINDINGS:     FINDINGS: Enhancing mass with central necrosis in the left parietal and  left temporal lobe including the mesial temporal lobe measuring 4.2 cm AP  dimension by 2.9 cm transverse dimension by 2.8 cm craniocaudal dimension.  The mass involves the deep and periventricular white matter and extends  along the ependymal lining of the left lateral ventricle involving the  trigone, temporal and occipital horn. Hyperperfusion is noted to the  peripheral enhancing portions of the mass. There is surrounding masslike  nonenhancing signal abnormality best seen on the prior MRI on FLAIR/T2  weighted images in the left parietal, temporal and  occipital lobes as well as extending across the splenium of the  corpus  callosum. An additional lesion just posterior to the above-mentioned lesion  connected by the abnormal white matter signal to the larger mass measuring  1.1 cm x 0.6 cm is seen. There is also a separate left occipital mass  measuring 9 mm x 10 mm, which is also connected to the above-mentioned  lesions by T2/FLAIR signal abnormality is seen on the prior study. Some  effacement of the trigone and  temporal horns of the left lateral ventricle with approximately 1-2 mm  rightward midline shift, unchanged from prior.      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 Lymphoma is  another consideration.      Radiologist location ID: [id number]       Ordered:  CT BRAIN WO IV CONTRAST  XR AP MOBILE CHEST  MRI BRAIN W/WO CONTRAST  CT CHEST  ABDOMEN PELVIS W IV CONTRAST  MRI BRAIN STEALTH W/WO CONTRAST  US THYROID    Problem List:  Active Hospital Problems   (*Primary Problem)    Diagnosis   . *Brain mass   . Memory impairment   . Acute metabolic encephalopathy         Peter Garter, MD, 06/29/2020,  09:27

## 2020-06-30 ENCOUNTER — Encounter (HOSPITAL_COMMUNITY): Payer: Self-pay | Admitting: HOSPITALIST

## 2020-06-30 DIAGNOSIS — R413 Other amnesia: Secondary | ICD-10-CM

## 2020-06-30 DIAGNOSIS — G9341 Metabolic encephalopathy: Secondary | ICD-10-CM

## 2020-06-30 HISTORY — DX: Other amnesia: R41.3

## 2020-06-30 HISTORY — DX: Metabolic encephalopathy: G93.41

## 2020-06-30 LAB — CBC WITH DIFF
BASOPHIL #: 0 10*3/uL (ref 0.00–0.10)
BASOPHIL %: 0 % (ref 0–3)
EOSINOPHIL #: 0 10*3/uL (ref 0.00–0.50)
EOSINOPHIL %: 0 % (ref 0–5)
HCT: 38.4 % (ref 36.0–45.0)
HGB: 13 g/dL (ref 12.0–15.5)
LYMPHOCYTE #: 0.7 10*3/uL — ABNORMAL LOW (ref 1.00–4.80)
LYMPHOCYTE %: 3 % — ABNORMAL LOW (ref 15–43)
MCH: 29.1 pg (ref 27.5–33.2)
MCHC: 34 g/dL (ref 32.0–36.0)
MCV: 85.7 fL (ref 82.0–97.0)
MONOCYTE #: 0.5 10*3/uL (ref 0.20–0.90)
MONOCYTE %: 2 % — ABNORMAL LOW (ref 5–12)
MPV: 9.2 fL (ref 7.4–10.5)
NEUTROPHIL #: 21 10*3/uL — ABNORMAL HIGH (ref 1.50–6.50)
NEUTROPHIL %: 95 % — ABNORMAL HIGH (ref 43–76)
PLATELETS: 218 10*3/uL (ref 150–450)
RBC: 4.48 10*6/uL (ref 4.00–5.10)
RDW: 14.8 % (ref 11.0–16.0)
WBC: 22.2 10*3/uL — ABNORMAL HIGH (ref 4.0–11.0)

## 2020-06-30 LAB — BASIC METABOLIC PANEL
ANION GAP: 10 mmol/L (ref 4–13)
BUN/CREA RATIO: 21 (ref 6–22)
BUN: 18 mg/dL (ref 8–25)
CALCIUM: 9.8 mg/dL (ref 8.8–10.2)
CHLORIDE: 107 mmol/L (ref 96–111)
CO2 TOTAL: 26 mmol/L (ref 23–31)
CREATININE: 0.84 mg/dL (ref 0.60–1.05)
ESTIMATED GFR: 81 mL/min/BSA (ref >=60)
GLUCOSE: 147 mg/dL — ABNORMAL HIGH (ref 65–125)
POTASSIUM: 3.6 mmol/L (ref 3.5–5.1)
SODIUM: 143 mmol/L (ref 136–145)

## 2020-06-30 LAB — ABO & RH: ABO/RH(D): B POS

## 2020-06-30 LAB — POC FINGERSTICK GLUCOSE - BMC/JMC (RESULTS)
GLUCOSE, POC: 146 mg/dL — ABNORMAL HIGH (ref 60–100)
GLUCOSE, POC: 157 mg/dL — ABNORMAL HIGH (ref 60–100)
GLUCOSE, POC: 127 mg/dL — ABNORMAL HIGH (ref 60–100)
GLUCOSE, POC: 124 mg/dL — ABNORMAL HIGH (ref 60–100)

## 2020-06-30 LAB — TYPE AND SCREEN
ABO/RH(D): B POS
ANTIBODY SCREEN: NEGATIVE

## 2020-06-30 NOTE — Care Plan (Signed)
Problem: Adult Inpatient Plan of Care  Goal: Plan of Care Review  Outcome: Ongoing (see interventions/notes)  Goal: Patient-Specific Goal (Individualized)  Outcome: Ongoing (see interventions/notes)  Goal: Absence of Hospital-Acquired Illness or Injury  Outcome: Ongoing (see interventions/notes)  Goal: Optimal Comfort and Wellbeing  Outcome: Ongoing (see interventions/notes)  Goal: Rounds/Family Conference  Outcome: Ongoing (see interventions/notes)     Problem: Seizure, Active Management  Goal: Absence of Seizure/Seizure-Related Injury  Outcome: Ongoing (see interventions/notes)     Problem: Diabetes Comorbidity  Goal: Blood Glucose Level Within Targeted Range  Outcome: Ongoing (see interventions/notes)     Problem: Hypertension Comorbidity  Goal: Blood Pressure in Desired Range  Outcome: Ongoing (see interventions/notes)     Problem: Cognitive Impairment  Goal: Optimal Functional Independence  Outcome: Ongoing (see interventions/notes)     Problem: Coping Ineffective (Oncology Care)  Goal: Effective Coping  Outcome: Ongoing (see interventions/notes)     Problem: Fatigue (Oncology Care)  Goal: Improved Activity Tolerance  Outcome: Ongoing (see interventions/notes)     Problem: Oral Intake Altered (Oncology Care)  Goal: Optimal Oral Intake  Outcome: Ongoing (see interventions/notes)     Problem: Acute Neurologic Deterioration  Goal: Absence of Acute Neurologic Symptoms  Outcome: Ongoing (see interventions/notes)     Problem: Skin Injury Risk Increased  Goal: Skin Health and Integrity  Outcome: Ongoing (see interventions/notes)   [facility] Plan Note    Situation: 9/18  weakness, memory impairment    Intervention: fall precaution, antibiotic, tele monitor, scd, accucheck, aspiration and seizure precaution, VS and neurocheck Q4,  hourly rounding, call light within reach.    Response: no insulin coverage needed, denied pain, slept well, will continue to monitor.       Carrie Escobar, [place]

## 2020-06-30 NOTE — Care Plan (Signed)
Problem: Adult Inpatient Plan of Care  Goal: Plan of Care Review  Outcome: Ongoing (see interventions/notes)  Goal: Patient-Specific Goal (Individualized)  Outcome: Ongoing (see interventions/notes)  Goal: Absence of Hospital-Acquired Illness or Injury  Outcome: Ongoing (see interventions/notes)  Goal: Optimal Comfort and Wellbeing  Outcome: Ongoing (see interventions/notes)  Goal: Rounds/Family Conference  Outcome: Ongoing (see interventions/notes)     Problem: Seizure, Active Management  Goal: Absence of Seizure/Seizure-Related Injury  Outcome: Ongoing (see interventions/notes)     Problem: Diabetes Comorbidity  Goal: Blood Glucose Level Within Targeted Range  Outcome: Ongoing (see interventions/notes)     Problem: Cognitive Impairment  Goal: Optimal Functional Independence  Outcome: Ongoing (see interventions/notes)     Problem: Acute Neurologic Deterioration  Goal: Absence of Acute Neurologic Symptoms  Outcome: Ongoing (see interventions/notes)     Problem: Skin Injury Risk Increased  Goal: Skin Health and Integrity  Outcome: Ongoing (see interventions/notes)     [facility] Plan Note    Patient here for brain mass-biopsy scheduled for Thursday with Dr Ebony Hail. Patient still oriented to name only. Makes comments that are illogical at times but when questioned as to what she means-she will laugh it off. Very pleasant and appreciative of any care. Seen by Dr Geraldo Docker  today. PT/OT worked with her-walked her in hall-did 80'-not safe to be up on her own per PT notes. Denies any pain. High fall risk so fall protocols in place. Skin intact.

## 2020-06-30 NOTE — PT Treatment (Signed)
[facility]  Physical Therapy Progress Note      Subjective:  Pt lying in bed agreed to PT.    Numeric pain scale:  0out of 10      Areas of gait training assessed:  15 min      Ambulation surface:  Level, In patient's room and In hallway     Ambulation distance:  80 feet ( 40 feet out and back )      Ambulation ability:  Minimum assist and 1 person     Assistive devices:  None     Ambulation tolerance:  Fair     Comments: initially with standing and first 10 steps getting out of the room pt was unsteady and needed min help to correct her balance. Reported with looking up towards the door she felt fuzzy in her eyes. In hallway looking down at the floor she did not feel fuzzy . With turning around and looking up at nurses desk she again reported having difficulty focusing her eyes and her balance was off. Looked down and continued with gait.     Areas of functional mobility addressed:     Assistive devices used with bed mobility:  Bed rail     Supine - Sit:  Contact guard assist     Sit - Supine:  Contact guard assist     Sit - stand:  Minimum assist and 1 person as initially her balance was poor.      Balance while sitting:  Unsteady - no physical help     Balance while standing: Partial physical support    Bed alarm reset:  Yes    Assessment:  When looking up pt had difficulty her eyes being blurry and balance poor. Gait with walker 80 feet. Do not feel pt would be safe to be up on her own.     Plan:  Continue PT per plan of care    PT Recommendations for Nursing:  Up in chair for meals.   PT Recommendations for Patient/Family::  Not to get up without help.          Noralee Chars, PTA

## 2020-06-30 NOTE — Progress Notes (Signed)
[facility]  [place], [place] [address]    INPATIENT PROGRESS NOTE      Aleasha, Fregeau  Date of Admission:  06/27/2020  Date of Birth:  11/07/1945  Date of Service:  06/30/2020         Assessment/ Plan:       Intracranial masses:  Multi focal. Concerning for glioblastoma v Lymphoma v Mets. Associated with weakness, memory impairment and lethargy. CT chest/Abd/Plv only showing a thyroid mass. [place] of thyroid ordered. Continue to monitor in ICU. Patient will need biopsy of 1 of these masses for tissue diagnosis. Has to be off ASA for at least 5 days. Biopsy planned tentatively for Thursday. Continue on prophylactic Keppra IV. Continue Decadron. Continue  Neuro checks.  Appreciate recommendation from Neurology, Neurosurgery, and Oncology. Falls, seizure, and aspiration precautions. Supportive care as needed.      Memory impairment: Subacute. Ongoing for several weeks. Worsening.  Likely due to above diagnosis. Treat as outlined above. Further evaluation diagnosis as outlined above.      Lethargy: Subacute.  May be emanating from above diagnosis.  Treat as outlined above. PT OT evaluation and treatment.  Fall precautions.      Acute metabolic encephalopathy:  Mild to moderate.  Likely due to #1 diagnosis.  Treat as outlined above.  Neuro checks.  Supportive care.        Hypertension: Benign. Shy of goal. Resume oral regimen. Adjust as needed.       Diabetes mellitus: Type 2. Patient on Metformin. Will continue same. Sliding scale insulin coverage as needed. Fingersticks.        Prophylaxis:  SCD      Disposition:  Home when workup and treatment are completed.          Chief Complaint:  Intracranial Masses, sleepiness, forgetfulness, fatigue.      Subjective: Pt seen. Confused.      ROS: Denies fever/chills. No chest pain/SOB. No abdominal pain, nausea, or vomiting.         Today's Physical Exam:  BP 131/61   Pulse 79   Temp 36.1 C (97 F)   Resp 18   Ht 1.651 m (5\' 5" )   Wt 65 kg  (143 lb 4.8 oz)   SpO2 96%   BMI 23.85 kg/m       General: Confused, No acute distress.   Eyes: Pupils equal and round, reactive to light and accomodation.   HENT:Head atraumatic and normocephalic   Neck: No JVD or thyromegaly or lymphadenopathy   Lungs: Clear to auscultation bilaterally.   Cardiovascular: regular rate and rhythm, S1, S2 normal, no murmur,   Abdomen: Soft, non-tender, Bowel sounds normal, No hepatosplenomegaly   Extremities: extremities normal, atraumatic, no cyanosis or edema   Skin: Skin warm and dry   Neurologic: Confused   Lymphatics: No lymphadenopathy   Psychiatric: Normal affect and behavior.    Current Medications:  amLODIPine (NORVASC) tablet, 10 mg, Oral, Daily  cefTRIAXone (ROCEPHIN) 2 g in NS 50 mL IVPB minibag, 2 g, Intravenous, Q24H  dexamethasone 4 mg/mL injection, 4 mg, Intravenous, Q6H  fluticasone (FLONASE) 50 mcg per spray nasal spray, 1 Spray, Each Nostril, Daily  levETIRAcetam (KEPPRA) 1000 mg in iso-osmotic 100 mL premix IVPB, 1,000 mg, Intravenous, Q12H  LORazepam (ATIVAN) 2 mg/mL injection, 2 mg, Intravenous, Q4H PRN  [Held by provider] metFORMIN (GLUCOPHAGE XR) extended release tablet, 500 mg, Oral, Daily  SSIP insulin lispro (HUMALOG) 100 units/mL SubQ pen, 1-12 Units, Subcutaneous, 4x/day  AC  timolol (TIMOPTIC) 0.25% ophthalmic solution, 1 Drop, Right Eye, 2x/day          I/O:  I/O last 24 hours:      Intake/Output Summary (Last 24 hours) at 06/30/2020 1247  Last data filed at 06/30/2020 0900  Gross per 24 hour   Intake 120 ml   Output -   Net 120 ml     I/O current shift:  09/21 0700 - 09/21 1859  In: 120 [P.O.:120]  Out: -       Laboratory Results:    Reviewed:   Results for orders placed or performed during the hospital encounter of 06/27/20 (from the past 24 hour(s))   POC FINGERSTICK GLUCOSE - [facility] (RESULTS)   Result Value Ref Range    GLUCOSE, POC 154 (H) 60 - 100 mg/dl   TYPE AND SCREEN   Result Value Ref Range    UNITS ORDERED NOT STATED         ABO/RH(D) B  POSITIVE     ANTIBODY SCREEN NEGATIVE     SPECIMEN EXPIRATION DATE 07/02/2020    PT/INR   Result Value Ref Range    PROTHROMBIN TIME 15.0 (H) 9.4 - 12.5 seconds    INR 1.29    PTT (PARTIAL THROMBOPLASTIN TIME)   Result Value Ref Range    APTT 25.0 (L) 25.1 - 36.5 seconds   POC FINGERSTICK GLUCOSE - [facility] (RESULTS)   Result Value Ref Range    GLUCOSE, POC 120 (H) 60 - 100 mg/dl   BASIC METABOLIC PANEL - AM ONCE   Result Value Ref Range    SODIUM 143 136 - 145 mmol/L    POTASSIUM 3.6 3.5 - 5.1 mmol/L    CHLORIDE 107 96 - 111 mmol/L    CO2 TOTAL 26 23 - 31 mmol/L    ANION GAP 10 4 - 13 mmol/L    CALCIUM 9.8 8.8 - 10.2 mg/dL    GLUCOSE 147 (H) 65 - 125 mg/dL    BUN 18 8 - 25 mg/dL    CREATININE 0.84 0.60 - 1.05 mg/dL    BUN/CREA RATIO 21 6 - 22    ESTIMATED GFR 81 >=60 mL/min/BSA   CBC WITH DIFF   Result Value Ref Range    WBC 22.2 (H) 4.0 - 11.0 x10^3/uL    RBC 4.48 4.00 - 5.10 x10^6/uL    HGB 13.0 12.0 - 15.5 g/dL    HCT 38.4 36.0 - 45.0 %    MCV 85.7 82.0 - 97.0 fL    MCH 29.1 27.5 - 33.2 pg    MCHC 34.0 32.0 - 36.0 g/dL    RDW 14.8 11.0 - 16.0 %    PLATELETS 218 150 - 450 x10^3/uL    MPV 9.2 7.4 - 10.5 fL    NEUTROPHIL % 95 (H) 43 - 76 %    LYMPHOCYTE % 3 (L) 15 - 43 %    MONOCYTE % 2 (L) 5 - 12 %    EOSINOPHIL % 0 0 - 5 %    BASOPHIL % 0 0 - 3 %    NEUTROPHIL # 21.00 (H) 1.50 - 6.50 x10^3/uL    LYMPHOCYTE # 0.70 (L) 1.00 - 4.80 x10^3/uL    MONOCYTE # 0.50 0.20 - 0.90 x10^3/uL    EOSINOPHIL # 0.00 0.00 - 0.50 x10^3/uL    BASOPHIL # 0.00 0.00 - 0.10 x10^3/uL   POC FINGERSTICK GLUCOSE - [facility] (RESULTS)   Result Value Ref Range  GLUCOSE, POC 146 (H) 60 - 100 mg/dl     Ordered:    Lab orders   No lab orders         Radiological Studies:  Reviewed:      Ordered:  CT BRAIN WO IV CONTRAST  XR AP MOBILE CHEST  MRI BRAIN W/WO CONTRAST  CT CHEST ABDOMEN PELVIS W IV CONTRAST  MRI BRAIN STEALTH W/WO CONTRAST  US THYROID    Problem List:  Active Hospital Problems   (*Primary Problem)    Diagnosis   . *Brain mass   . Memory  impairment   . Acute metabolic encephalopathy         Peter Garter, MD, 06/30/2020,  12:47

## 2020-06-30 NOTE — Progress Notes (Signed)
Patient's EEG showed left temporal rhythmic delta concerning for seizure potential, from neurological standpoint will recommend to continue Keppra 1 g b.i.d. and can follow up in Neurology Clinic after discharge, please call Neurology for any questions, thanks    Addison Naegeli, MD  06/30/2020, 09:42

## 2020-06-30 NOTE — Care Plan (Signed)
When looking up pt had difficulty her eyes being blurry and balance poor. Gait with walker 80 feet. Do not feel pt would be safe to be up on her own.     Plan:  Continue PT per plan of care    PT Recommendations for Nursing:  Up in chair for meals.   PT Recommendations for Patient/Family::  Not to get up without help.          Noralee Chars, PTA

## 2020-06-30 NOTE — OT Treatment (Addendum)
OT attempted to see patient for OT. She was sleeping soundly and did not wake up when OT called her name.  Will attempt continued OT at later time/date as schedule allows.    Edited to add: Attempted again in p.m. Patient declined OT stating she was very fatigued and requested that OT return tomorrow.    Marveen Reeks, OT

## 2020-07-01 ENCOUNTER — Inpatient Hospital Stay (HOSPITAL_COMMUNITY): Payer: Medicare PPO

## 2020-07-01 DIAGNOSIS — Z01818 Encounter for other preprocedural examination: Secondary | ICD-10-CM

## 2020-07-01 DIAGNOSIS — G9389 Other specified disorders of brain: Secondary | ICD-10-CM

## 2020-07-01 LAB — POC FINGERSTICK GLUCOSE - BMC/JMC (RESULTS)
GLUCOSE, POC: 152 mg/dL — ABNORMAL HIGH (ref 60–100)
GLUCOSE, POC: 133 mg/dL — ABNORMAL HIGH (ref 60–100)
GLUCOSE, POC: 159 mg/dL — ABNORMAL HIGH (ref 60–100)
GLUCOSE, POC: 137 mg/dL — ABNORMAL HIGH (ref 60–100)

## 2020-07-01 MED ORDER — IOPAMIDOL 370 MG IODINE/ML (76 %) INTRAVENOUS SOLUTION
100.0000 mL | INTRAVENOUS | Status: AC
Start: 2020-07-01 — End: 2020-07-01
  Administered 2020-07-01: 11:00:00 100 mL via INTRAVENOUS

## 2020-07-01 NOTE — Progress Notes (Signed)
[facility]  [place], [place] [address]    INPATIENT PROGRESS NOTE      Emmalena, Canny  Date of Admission:  06/27/2020  Date of Birth:  28-Jan-1946  Date of Service:  07/01/2020         Assessment/ Plan:       Intracranial masses:  Multi focal. Concerning for glioblastoma v Lymphoma v Mets. Associated with weakness, memory impairment and lethargy. CT chest/Abd/Plv only showing a thyroid mass. [place] of thyroid ordered. Continue to monitor in ICU. Patient will need biopsy of 1 of these masses for tissue diagnosis. Has to be off ASA for at least 5 days. Biopsy planned tentatively for Thursday 07/02/20. Continue on prophylactic Keppra IV. Continue Decadron. Continue  Neuro checks.  Appreciate recommendation from Neurology, Neurosurgery, and Oncology. Falls, seizure, and aspiration precautions. Supportive care as needed.      Abnormal EEG result: Delta wave noted concerning for seizure potential. Keppra to continue per Dr Reesa Chew. Seizure precautions. Fall precaution. IV ativan for breakthrough seizure. Supportive care.       Memory impairment/Confusion: Subacute. Ongoing for several weeks. Worsening.  Likely due to above diagnoses. Treat as outlined above. Further evaluation diagnosis as outlined above.      Lethargy: Subacute.  May be emanating from above diagnosis.  Treat as outlined above. PT OT evaluation and treatment.  Fall precautions.      Acute metabolic encephalopathy:  Mild to moderate.  Likely due to #1 diagnosis.  Treat as outlined above.  Neuro checks.  Supportive care.        Hypertension: Benign. Shy of goal. Resume oral regimen. Adjust as needed.       Diabetes mellitus: Type 2. Patient on Metformin. Will continue same. Sliding scale insulin coverage as needed. Fingersticks.        Prophylaxis:  SCD      Disposition:  Home when workup and treatment are completed.          Chief Complaint:  Intracranial Masses, sleepiness, forgetfulness, fatigue.      Subjective: Pt seen.  Confused.      ROS: Denies fever/chills. No chest pain/SOB. No abdominal pain, nausea, or vomiting.         Today's Physical Exam:  BP (!) 148/76    Pulse 51    Temp 36.1 C (97 F)    Resp 16    Ht 1.651 m (5\' 5" )    Wt 65 kg (143 lb 4.8 oz)    SpO2 96%    BMI 23.85 kg/m       General: Confused, No acute distress.   Eyes: Pupils equal and round, reactive to light and accomodation.   HENT:Head atraumatic and normocephalic   Neck: No JVD or thyromegaly or lymphadenopathy   Lungs: Clear to auscultation bilaterally.   Cardiovascular: regular rate and rhythm, S1, S2 normal, no murmur,   Abdomen: Soft, non-tender, Bowel sounds normal, No hepatosplenomegaly   Extremities: extremities normal, atraumatic, no cyanosis or edema   Skin: Skin warm and dry   Neurologic: Confused   Lymphatics: No lymphadenopathy   Psychiatric: Normal affect and behavior.    Current Medications:  amLODIPine (NORVASC) tablet, 10 mg, Oral, Daily  cefTRIAXone (ROCEPHIN) 2 g in NS 50 mL IVPB minibag, 2 g, Intravenous, Q24H  dexamethasone 4 mg/mL injection, 4 mg, Intravenous, Q6H  fluticasone (FLONASE) 50 mcg per spray nasal spray, 1 Spray, Each Nostril, Daily  levETIRAcetam (KEPPRA) 1000 mg in iso-osmotic 100 mL premix IVPB,  1,000 mg, Intravenous, Q12H  LORazepam (ATIVAN) 2 mg/mL injection, 2 mg, Intravenous, Q4H PRN  [Held by provider] metFORMIN (GLUCOPHAGE XR) extended release tablet, 500 mg, Oral, Daily  SSIP insulin lispro (HUMALOG) 100 units/mL SubQ pen, 1-12 Units, Subcutaneous, 4x/day AC  timolol (TIMOPTIC) 0.25% ophthalmic solution, 1 Drop, Right Eye, 2x/day          I/O:  I/O last 24 hours:      Intake/Output Summary (Last 24 hours) at 07/01/2020 0908  Last data filed at 06/30/2020 1700  Gross per 24 hour   Intake 120 ml   Output --   Net 120 ml     I/O current shift:  No intake/output data recorded.      Laboratory Results:    Reviewed:   Results for orders placed or performed during the hospital encounter of 06/27/20 (from the past 24  hour(s))   POC FINGERSTICK GLUCOSE - [facility] (RESULTS)   Result Value Ref Range    GLUCOSE, POC 157 (H) 60 - 100 mg/dl   POC FINGERSTICK GLUCOSE - [facility] (RESULTS)   Result Value Ref Range    GLUCOSE, POC 127 (H) 60 - 100 mg/dl   POC FINGERSTICK GLUCOSE - [facility] (RESULTS)   Result Value Ref Range    GLUCOSE, POC 124 (H) 60 - 100 mg/dl   POC FINGERSTICK GLUCOSE - [facility] (RESULTS)   Result Value Ref Range    GLUCOSE, POC 152 (H) 60 - 100 mg/dl     Ordered:    Lab orders   No lab orders         Radiological Studies:  Reviewed:      Ordered:  CT BRAIN WO IV CONTRAST  XR AP MOBILE CHEST  MRI BRAIN W/WO CONTRAST  CT CHEST ABDOMEN PELVIS W IV CONTRAST  MRI BRAIN STEALTH W/WO CONTRAST  US THYROID  CT BRAIN STEALTH W CONTRAST    Problem List:  Active Hospital Problems   (*Primary Problem)    Diagnosis    *Brain mass    Memory impairment    Acute metabolic encephalopathy         Peter Garter, MD, 07/01/2020,  09:08

## 2020-07-01 NOTE — PT Treatment (Signed)
Pt off the floor for test. Will follow up as time allows.     Noralee Chars, PTA

## 2020-07-01 NOTE — Care Plan (Signed)
Problem: Adult Inpatient Plan of Care  Goal: Plan of Care Review  Outcome: Ongoing (see interventions/notes)  Goal: Patient-Specific Goal (Individualized)  Outcome: Ongoing (see interventions/notes)  Goal: Absence of Hospital-Acquired Illness or Injury  Outcome: Ongoing (see interventions/notes)  Goal: Optimal Comfort and Wellbeing  Outcome: Ongoing (see interventions/notes)  Goal: Rounds/Family Conference  Outcome: Ongoing (see interventions/notes)     Problem: Seizure, Active Management  Goal: Absence of Seizure/Seizure-Related Injury  Outcome: Ongoing (see interventions/notes)     Problem: Diabetes Comorbidity  Goal: Blood Glucose Level Within Targeted Range  Outcome: Ongoing (see interventions/notes)     Problem: Hypertension Comorbidity  Goal: Blood Pressure in Desired Range  Outcome: Ongoing (see interventions/notes)     Problem: Cognitive Impairment  Goal: Optimal Functional Independence  Outcome: Ongoing (see interventions/notes)     Problem: Coping Ineffective (Oncology Care)  Goal: Effective Coping  Outcome: Ongoing (see interventions/notes)     Problem: Fatigue (Oncology Care)  Goal: Improved Activity Tolerance  Outcome: Ongoing (see interventions/notes)     Problem: Oral Intake Altered (Oncology Care)  Goal: Optimal Oral Intake  Outcome: Ongoing (see interventions/notes)     Problem: Acute Neurologic Deterioration  Goal: Absence of Acute Neurologic Symptoms  Outcome: Ongoing (see interventions/notes)     Problem: Skin Injury Risk Increased  Goal: Skin Health and Integrity  Outcome: Ongoing (see interventions/notes)

## 2020-07-01 NOTE — Nurses Notes (Signed)
Patient is alert, oriented to self only. She is able to make her needs known. Ambulates to bathroom with 1 standby assist. Continues on Decadron, Keppra and IV ABTs. Denies presence of pain. SCDs in place.

## 2020-07-01 NOTE — Care Plan (Signed)
[facility] Plan Note    Situation: Brain Mass    Intervention: Patient placed on seizure precautuions (side rails padded, suction set up)    Response: Patient oriented to self (at times). Patient on room air. Seizure precautions maintained.      Vaughan Browner, RN    Problem: Seizure, Active Management  Goal: Absence of Seizure/Seizure-Related Injury  Outcome: Ongoing (see interventions/notes)     Problem: Diabetes Comorbidity  Goal: Blood Glucose Level Within Targeted Range  Outcome: Ongoing (see interventions/notes)  Intervention: Monitor and Manage Glycemia  Flowsheets (Taken 07/01/2020 1449)  Glycemic Management:   blood glucose monitored   oral hydration promoted

## 2020-07-01 NOTE — OT Treatment (Signed)
[facility]  Inpatient OT Progress Note    Patient Name: Carrie Escobar  Date of Birth: 1946-05-09  Height: 165.1 cm (5\' 5" )  Weight: 65 kg (143 lb 4.8 oz)  Room/Bed: 404/B  Payor: [place] MEDICARE / Plan: [employer] MEDICARE ADVANTAGE PPO / Product Type: PPO /     Encounter Date: 06/27/2020 11:35 AM  Inpatient Admission Date: 06/27/2020    Admitting Diagnosis:  Brain mass [G93.89]    Subjective/Objective:   S: Patient was just returning to room from a test. She reported having to use the restroom and was agreeable to OT services.     O: Treatment Time:   12 Minutes        Treatment Charge:            Therapeutic Activity x 1      Hygiene:   Pt. was supervision to complete toilet hygiene after voiding.    Therapeutic Activity:  Patient was SBA x 1 to transfer from supine to sit at EOB. She ambulated 10' to/from bathroom with RW and min assist x 1. She transferred to toilet with min assist x 1. After voiding, she stood from toilet with min assist x 1 and ambulated to sink to wash hands. She required min verbal cues to locate objects (ie paper towels/soap) as she did report some blurred vision and min assist to sequence activity. She ambulated back to bed with min assist x 1 and returned to supine with SBA x 1.     Patient declined further ADLs citing fatigue.                             Assessment:   Tolerated well. She demonstrated improvement in functional transfers/mobility with improved dynamic standing balance.  She required min assist x 1 to sequence hygiene task and was supervision for toilet hygiene. Recommend continued OT.       Plan:     Continue to follow patient as appropriate according to established plan of care, progressing as   tolerated.     OT Recommendations for Nursing:  Encourage patient to participate in care  OT Recommendations for Patient/Family:  Participate in care for strengthening    The risks/benefits of therapy have been discussed with the patient and  he/she is in agreement with the established plan of care.     Therapist :     Marveen Reeks, OT  Pager number: [phone]    Total Treatment Time:  12 minutes    Time may include review of chart notes, obtaining patient's functional history from patient/family/medical staff/case management/ancillary personnel, collaboration on findings and treatment options (with the above mentioned individuals), re-assessment, and acute care rehabilitation.

## 2020-07-01 NOTE — Care Plan (Signed)
Assessment:   Tolerated well. She demonstrated improvement in functional transfers/mobility with improved dynamic standing balance.  She required min assist x 1 to sequence hygiene task and was supervision for toilet hygiene. Recommend continued OT.       Plan:     Continue to follow patient as appropriate according to established plan of care, progressing as   tolerated.     OT Recommendations for Nursing:  Encourage patient to participate in care  OT Recommendations for Patient/Family:  Participate in care for strengthening    The risks/benefits of therapy have been discussed with the patient and he/she is in agreement with the established plan of care.     Therapist :     Marveen Reeks, OT  Pager number: [phone]    Total Treatment Time:  12 minutes    Time may include review of chart notes, obtaining patient's functional history from patient/family/medical staff/case management/ancillary personnel, collaboration on findings and treatment options (with the above mentioned individuals), re-assessment, and acute care rehabilitation.

## 2020-07-01 NOTE — Progress Notes (Signed)
[facility]  NEUROSURGERY   PROGRESS NOTE      Judithann, Villamar, 74 y.o. female  Date of Admission:  06/27/2020  Date of Service: 07/01/2020  Date of Birth:  04/17/1946    Referring Physician:  No ref. provider found    Post Op Day:   S/P Procedure(s) (LRB):  Left temporal Stealth guided burr hole and tumor biopsy (Left)    Chief Complaint: Brain mass  Subjective: Jaxie Racanelli is a 74 y.o., right handed 89 American female who presented with progressive confusion, memory loss, and forgetfulness over the last month. Her confusion has worsened since being admitted to the hospital. She is frustrated with her inability to remember. She is currently pain free. Denies visual changes, headache, double vision, nausea, or dizziness. States she is nervous for surgery tomorrow.     Vital Signs:  Temp (24hrs) Max:36.4 C (84.6 F)      Systolic (96EXB), MWU:132 , Min:130 , GMW:102     Diastolic (72ZDG), UYQ:03, Min:61, Max:81    Temp  Avg: 36.3 C (97.3 F)  Min: 36.1 C (97 F)  Max: 36.4 C (97.5 F)  MAP (Non-Invasive)  Avg: 83 mmHG  Min: 74 mmHG  Max: 93 mmHG  Pulse  Avg: 56.6  Min: 49  Max: 79  Resp  Avg: 17  Min: 16  Max: 18  SpO2  Avg: 97.4 %  Min: 96 %  Max: 99 %       Min/Max/Avg ICP/CPP last 24hrs:   No data recorded    Today's Physical Exam:  Constitutional: no distress  Eyes: Conjunctiva clear.  ENT: ENMT without erythema or injection, mucous membranes moist.  Neck: supple, symmetrical, trachea midline  Respiratory: Clear to auscultation bilaterally.   Cardiovascular: regular rate and rhythm  Gastrointestinal: Soft, non-tender  Musculoskeletal: 5/5 in both upper and lower extremities  Integumentary:  Skin warm and dry and No rashes  Neurologic: CN II - XII grossly intact , Mental status: alert, oriented, thought content appropriate and A/O x 1. She is more confused today. Having difficulty following commands. Coordination is intact. Pronator drift is negative.    Lymphatic/Immunologic/Hematologic: No lymphadenopathy  Psychiatric: Normal        Current Medications:  amLODIPine (NORVASC) tablet, 10 mg, Oral, Daily  cefTRIAXone (ROCEPHIN) 2 g in NS 50 mL IVPB minibag, 2 g, Intravenous, Q24H  dexamethasone 4 mg/mL injection, 4 mg, Intravenous, Q6H  fluticasone (FLONASE) 50 mcg per spray nasal spray, 1 Spray, Each Nostril, Daily  levETIRAcetam (KEPPRA) 1000 mg in iso-osmotic 100 mL premix IVPB, 1,000 mg, Intravenous, Q12H  LORazepam (ATIVAN) 2 mg/mL injection, 2 mg, Intravenous, Q4H PRN  [Held by provider] metFORMIN (GLUCOPHAGE XR) extended release tablet, 500 mg, Oral, Daily  SSIP insulin lispro (HUMALOG) 100 units/mL SubQ pen, 1-12 Units, Subcutaneous, 4x/day AC  timolol (TIMOPTIC) 0.25% ophthalmic solution, 1 Drop, Right Eye, 2x/day        I/O:  I/O last 24 hours:    Intake/Output Summary (Last 24 hours) at 07/01/2020 1124  Last data filed at 07/01/2020 1000  Gross per 24 hour   Intake 240 ml   Output    Net 240 ml     I/O current shift:  09/22 0700 - 09/22 1859  In: 120 [P.O.:120]  Out: -     Antibiotics: Date Started Date Completed   1.     2.     3.     4.       Nutrition/Residuals:  DIET CARDIAC Supplement: Ensure High Protein; Ensure HP once daily with lunch  DIET NPO - SPECIFIC DATE & TIME STRICT    Labs  Please indicate ordered or reviewed)  Reviewed:   Lab Results for Last 24 Hours:    Results for orders placed or performed during the hospital encounter of 06/27/20 (from the past 24 hour(s))   POC FINGERSTICK GLUCOSE - [facility] (RESULTS)   Result Value Ref Range    GLUCOSE, POC 157 (H) 60 - 100 mg/dl   POC FINGERSTICK GLUCOSE - [facility] (RESULTS)   Result Value Ref Range    GLUCOSE, POC 127 (H) 60 - 100 mg/dl   POC FINGERSTICK GLUCOSE - [facility] (RESULTS)   Result Value Ref Range    GLUCOSE, POC 124 (H) 60 - 100 mg/dl   POC FINGERSTICK GLUCOSE - [facility] (RESULTS)   Result Value Ref Range    GLUCOSE, POC 152 (H) 60 - 100 mg/dl     Ordered:  None    Diagnostic Tests (Please  indicate ordered or reviewed)  Reviewed: None  Ordered:  None    Radiology Tests (Please indicate ordered or reviewed)  Reviewed: CT:   Direct visualization of the image on 07/01/2020 on [facility] PACS showed:  Redemonstration of large mass centered in the left tempoparietal region. There is also a smaller focus of vasogenic edema in the left occipital region. Findings are consistent with known multifocal disease. There is 24mm left to right midline shift and mass effect.     Ordered:  None    Assessment/ Plan:  Active Hospital Problems   (*Primary Problem)    Diagnosis    *Brain mass    Memory impairment    Acute metabolic encephalopathy       Patient/ Family Discussion:   Ms. Qunisha Bryk is a pleasant 74 year old right-handed female with newly diagnosed multiple brain lesions.  The patient has had progressive issues with memory as well as general confusion.  She is currently A&O x1 and more confused today. Otherwise is neurologically intact.  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  However could be lymphoma versus metastases well.    Brain biopsy scheduled for tomorrow morning. Continue to hold ASA. Will need to be NPO starting at midnight. Family is aware of the plan. Consent forms have been signed. Pre-op lab work completed. Continue Keppra for seizure prophylaxis. Continue q2h neuro checks.     I personally saw and evaluated the patient. See mid-level's note for additional details.     Particia Jasper, MD

## 2020-07-01 NOTE — Ancillary Notes (Signed)
[facility]  Medical Nutrition Therapy Screen Note                                      Date of Service: 07/01/2020    Reason for Note: Nutrition Screen Follow-Up    Current Diet Order/Nutrition Support:  DIET CARDIAC Supplement: Ensure High Protein; Ensure HP once daily with lunch  DIET NPO - SPECIFIC DATE & TIME STRICT    Reviewed patient status, diet order/TF/TPN, labs and medications.  Height: 165.1 cm (5\' 5" )   Weight: 65 kg (143 lb 4.8 oz) (06/28/20 0316)   Body mass index is 23.85 kg/m.    Brief Subjective:   Patient admitted for multiple brain masses. Noted with confusion but generally able to make needs known.  Neurology and oncology have assessed patient. Planned L temporal brain biopsy to take place tomorrow 9/23.  Diet order continues as Cardiac with Ensure HP once daily. Intakes limited; however, yesterday intakes were 50%, 50% and 0%.  NPO status planned at midnight in anticipation for biopsy.  Braden Score= 17; nutrition adequate  Weight remains the same as last assessment  Meds Reviewed  Labs Reviewed: Glu 147 (H), Glu POC 152 (H)     Most Recent Screen Previous screen   Impaired Nutrition Status Score Impaired Nutrition Status Score: 1 - Food intake below 50 - 75% of normal requirement in preceding week - Mild (07/01/20 0900)   Impaired Nutrition Status Score: 0 - Normal nutritional status (06/28/20 0600)           Severity of Disease Score Severity of Disease Score: 0 - Normal nutritional requirements - Absent (07/01/20 0900)   Severity of Disease Score: 0 - Normal nutritional requirements - Absent (06/28/20 0600)       Age Age: 72 - > 70 years (07/01/20 0900) Age: 72 - > 70 years (06/28/20 0600)     Score Score: 2 (07/01/20 0900)     Score: 1 (06/28/20 0600)   Risk Level Risk Level: Level 2 - 2 pts - No initial note required. Follow-up within 5 days (07/01/20 0900)   Risk Level: Level 1 - 0 to 1 pt - No initial note required. Follow up within 7 days  (06/28/20 0600)           Nutrition related problems: unintentional weight loss (maintains body weight which is appropriate for age at this time)- IN PROGRESS- no new weight status to review.    Assessment: Needs assessed/reassessed in 5-6 days    Monitor: Po status and Tolerance of diet along with intakes and weight status.    Plan/Intervention: Continue supplement Ensure HP   Will continue to monitor per screening protocol       Rebeca Allegra, RDLD     Ext [phone]

## 2020-07-01 NOTE — PT Treatment (Addendum)
Am pt out of the room for test.     Noralee Chars, PTA    Pt declined to get up this PM.

## 2020-07-02 ENCOUNTER — Inpatient Hospital Stay (HOSPITAL_COMMUNITY): Payer: Medicare PPO | Admitting: Certified Registered"

## 2020-07-02 ENCOUNTER — Inpatient Hospital Stay (HOSPITAL_BASED_OUTPATIENT_CLINIC_OR_DEPARTMENT_OTHER): Payer: Medicare PPO

## 2020-07-02 ENCOUNTER — Encounter (HOSPITAL_COMMUNITY)
Admission: EM | Disposition: A | Discharge status: Home Health | Payer: Self-pay | Source: Home / Self Care | Attending: HOSPITALIST

## 2020-07-02 DIAGNOSIS — D496 Neoplasm of unspecified behavior of brain: Secondary | ICD-10-CM

## 2020-07-02 DIAGNOSIS — C712 Malignant neoplasm of temporal lobe: Secondary | ICD-10-CM

## 2020-07-02 DIAGNOSIS — I6789 Other cerebrovascular disease: Secondary | ICD-10-CM

## 2020-07-02 DIAGNOSIS — Z9889 Other specified postprocedural states: Secondary | ICD-10-CM

## 2020-07-02 DIAGNOSIS — R001 Bradycardia, unspecified: Secondary | ICD-10-CM

## 2020-07-02 LAB — BASIC METABOLIC PANEL
ANION GAP: 11 mmol/L (ref 4–13)
BUN/CREA RATIO: 22 (ref 6–22)
BUN: 17 mg/dL (ref 8–25)
CALCIUM: 9.4 mg/dL (ref 8.8–10.2)
CHLORIDE: 107 mmol/L (ref 96–111)
CO2 TOTAL: 23 mmol/L (ref 23–31)
CREATININE: 0.78 mg/dL (ref 0.60–1.05)
ESTIMATED GFR: 88 mL/min/BSA (ref >=60)
GLUCOSE: 143 mg/dL — ABNORMAL HIGH (ref 65–125)
POTASSIUM: 3.5 mmol/L (ref 3.5–5.1)
SODIUM: 141 mmol/L (ref 136–145)

## 2020-07-02 LAB — POC FINGERSTICK GLUCOSE - BMC/JMC (RESULTS)
GLUCOSE, POC: 132 mg/dL — ABNORMAL HIGH (ref 60–100)
GLUCOSE, POC: 131 mg/dL — ABNORMAL HIGH (ref 60–100)
GLUCOSE, POC: 134 mg/dL — ABNORMAL HIGH (ref 60–100)

## 2020-07-02 LAB — CBC WITH DIFF
BASOPHIL #: 0 10*3/uL (ref 0.00–0.10)
BASOPHIL %: 0 % (ref 0–3)
EOSINOPHIL #: 0 10*3/uL (ref 0.00–0.50)
EOSINOPHIL %: 0 % (ref 0–5)
HCT: 39.7 % (ref 36.0–45.0)
HGB: 13.6 g/dL (ref 12.0–15.5)
LYMPHOCYTE #: 0.9 10*3/uL — ABNORMAL LOW (ref 1.00–4.80)
LYMPHOCYTE %: 7 % — ABNORMAL LOW (ref 15–43)
MCH: 29.2 pg (ref 27.5–33.2)
MCHC: 34.3 g/dL (ref 32.0–36.0)
MCV: 85.2 fL (ref 82.0–97.0)
MONOCYTE #: 0.4 10*3/uL (ref 0.20–0.90)
MONOCYTE %: 3 % — ABNORMAL LOW (ref 5–12)
MPV: 9.1 fL (ref 7.4–10.5)
NEUTROPHIL #: 11.7 10*3/uL — ABNORMAL HIGH (ref 1.50–6.50)
NEUTROPHIL %: 90 % — ABNORMAL HIGH (ref 43–76)
PLATELETS: 192 10*3/uL (ref 150–450)
RBC: 4.67 10*6/uL (ref 4.00–5.10)
RDW: 14.7 % (ref 11.0–16.0)
WBC: 13 10*3/uL — ABNORMAL HIGH (ref 4.0–11.0)

## 2020-07-02 SURGERY — CRANIOTOMY FOR BIOPSY BRAIN WITH NAVIGATION
Anesthesia: General | Laterality: Left | Wound class: Clean Wound: Uninfected operative wounds in which no inflammation occurred

## 2020-07-02 MED ORDER — ROCURONIUM 10 MG/ML INTRAVENOUS SOLUTION
Freq: Once | INTRAVENOUS | Status: DC | PRN
Start: 2020-07-02 — End: 2020-07-02
  Administered 2020-07-02: 20 mg via INTRAVENOUS
  Administered 2020-07-02: 30 mg via INTRAVENOUS

## 2020-07-02 MED ORDER — SODIUM CHLORIDE 0.9 % (FLUSH) INJECTION SYRINGE
10.0000 mL | INJECTION | Freq: Three times a day (TID) | INTRAMUSCULAR | Status: DC
Start: 2020-07-02 — End: 2020-07-08
  Administered 2020-07-02 – 2020-07-03 (×4): 10 mL via INTRAVENOUS
  Administered 2020-07-03 – 2020-07-04 (×2): 0 mL via INTRAVENOUS
  Administered 2020-07-04 (×2): 10 mL via INTRAVENOUS
  Administered 2020-07-05: 0 mL via INTRAVENOUS
  Administered 2020-07-05 – 2020-07-08 (×9): 10 mL via INTRAVENOUS

## 2020-07-02 MED ORDER — ACETAMINOPHEN 1,000 MG/100 ML (10 MG/ML) INTRAVENOUS SOLUTION
1000.0000 mg | Freq: Once | INTRAVENOUS | Status: DC | PRN
Start: 2020-07-02 — End: 2020-07-02

## 2020-07-02 MED ORDER — SUGAMMADEX 100 MG/ML INTRAVENOUS SOLUTION
Freq: Once | INTRAVENOUS | Status: DC | PRN
Start: 2020-07-02 — End: 2020-07-02
  Administered 2020-07-02: 200 mg via INTRAVENOUS

## 2020-07-02 MED ORDER — SODIUM CHLORIDE 0.9 % IRRIGATION SOLUTION
Freq: Once | Status: DC | PRN
Start: 2020-07-02 — End: 2020-07-02
  Administered 2020-07-02: 500 mL

## 2020-07-02 MED ORDER — CEFAZOLIN 1 GRAM/50 ML IN DEXTROSE (ISO-OSMOTIC) INTRAVENOUS PIGGYBACK
1.0000 g | INJECTION | Freq: Three times a day (TID) | INTRAVENOUS | Status: AC
Start: 2020-07-02 — End: 2020-07-03
  Administered 2020-07-02: 1 g via INTRAVENOUS
  Administered 2020-07-02 – 2020-07-03 (×2): 0 g via INTRAVENOUS
  Administered 2020-07-03 (×2): 1 g via INTRAVENOUS
  Administered 2020-07-03: 0 g via INTRAVENOUS
  Filled 2020-07-02 (×4): qty 50

## 2020-07-02 MED ORDER — PROPOFOL 10 MG/ML IV BOLUS
INJECTION | Freq: Once | INTRAVENOUS | Status: DC | PRN
Start: 2020-07-02 — End: 2020-07-02
  Administered 2020-07-02: 160 mg via INTRAVENOUS

## 2020-07-02 MED ORDER — GLYCOPYRROLATE 0.2 MG/ML INJECTION SOLUTION
Freq: Once | INTRAMUSCULAR | Status: DC | PRN
Start: 2020-07-02 — End: 2020-07-02
  Administered 2020-07-02: .2 mg via INTRAVENOUS

## 2020-07-02 MED ORDER — LIDOCAINE 1 %-EPINEPHRINE 1:100,000 INJECTION SOLUTION
Freq: Once | INTRAMUSCULAR | Status: DC | PRN
Start: 2020-07-02 — End: 2020-07-02
  Administered 2020-07-02: 5 mL via INTRAMUSCULAR

## 2020-07-02 MED ORDER — THROMBIN (RECOMBINANT) 5,000 UNIT TOPICAL SOLUTION - EAST
Freq: Once | CUTANEOUS | Status: DC | PRN
Start: 2020-07-02 — End: 2020-07-02
  Administered 2020-07-02: 5000 [IU] via TOPICAL

## 2020-07-02 MED ORDER — LIDOCAINE (PF) 100 MG/5 ML (2 %) INTRAVENOUS SYRINGE
INJECTION | Freq: Once | INTRAVENOUS | Status: DC | PRN
Start: 2020-07-02 — End: 2020-07-02
  Administered 2020-07-02 (×2): 100 mg via INTRAVENOUS

## 2020-07-02 MED ORDER — MORPHINE 4 MG/ML INTRAVENOUS SOLUTION
2.0000 mg | INTRAVENOUS | Status: DC | PRN
Start: 2020-07-02 — End: 2020-07-03

## 2020-07-02 MED ORDER — LEVETIRACETAM 500 MG TABLET
1000.0000 mg | ORAL_TABLET | Freq: Two times a day (BID) | ORAL | Status: DC
Start: 2020-07-02 — End: 2020-07-08
  Administered 2020-07-02 – 2020-07-08 (×12): 1000 mg via ORAL
  Filled 2020-07-02 (×13): qty 2

## 2020-07-02 MED ORDER — CEFAZOLIN 2 GRAM/50 ML IN DEXTROSE (ISO-OSMOTIC) INTRAVENOUS PIGGYBACK
2.0000 g | INJECTION | Freq: Once | INTRAVENOUS | Status: AC
Start: 2020-07-02 — End: 2020-07-02
  Administered 2020-07-02: 2 g via INTRAVENOUS
  Filled 2020-07-02: qty 50

## 2020-07-02 MED ORDER — EPHEDRINE SULFATE 50 MG/ML INTRAVENOUS SOLUTION
Freq: Once | INTRAVENOUS | Status: DC | PRN
Start: 2020-07-02 — End: 2020-07-02
  Administered 2020-07-02: 10 mg via INTRAVENOUS

## 2020-07-02 MED ORDER — HYDROCODONE 5 MG-ACETAMINOPHEN 325 MG TABLET
1.0000 | ORAL_TABLET | ORAL | Status: DC | PRN
Start: 2020-07-02 — End: 2020-07-03

## 2020-07-02 MED ORDER — SODIUM CHLORIDE 0.9% FLUSH BAG - 250 ML
INTRAVENOUS | Status: DC | PRN
Start: 2020-07-02 — End: 2020-07-08

## 2020-07-02 MED ORDER — MORPHINE 4 MG/ML INTRAVENOUS SOLUTION
2.0000 mg | INTRAVENOUS | Status: DC | PRN
Start: 2020-07-02 — End: 2020-07-02

## 2020-07-02 MED ORDER — FENTANYL (PF) 50 MCG/ML INJECTION SOLUTION
Freq: Once | INTRAMUSCULAR | Status: DC | PRN
Start: 2020-07-02 — End: 2020-07-02
  Administered 2020-07-02: 50 ug via INTRAVENOUS
  Administered 2020-07-02 (×2): 25 ug via INTRAVENOUS

## 2020-07-02 MED ORDER — SODIUM CHLORIDE 0.9 % (FLUSH) INJECTION SYRINGE
10.0000 mL | INJECTION | INTRAMUSCULAR | Status: DC | PRN
Start: 2020-07-02 — End: 2020-07-08

## 2020-07-02 MED ORDER — LACTATED RINGERS INTRAVENOUS SOLUTION
INTRAVENOUS | Status: DC | PRN
Start: 2020-07-02 — End: 2020-07-02

## 2020-07-02 MED ORDER — ONDANSETRON HCL (PF) 4 MG/2 ML INJECTION SOLUTION
4.0000 mg | Freq: Once | INTRAMUSCULAR | Status: AC
Start: 2020-07-02 — End: 2020-07-02
  Administered 2020-07-02: 4 mg via INTRAVENOUS
  Filled 2020-07-02: qty 2

## 2020-07-02 MED ORDER — ONDANSETRON HCL (PF) 4 MG/2 ML INJECTION SOLUTION
Freq: Once | INTRAMUSCULAR | Status: DC | PRN
Start: 2020-07-02 — End: 2020-07-02
  Administered 2020-07-02: 4 mg via INTRAVENOUS

## 2020-07-02 SURGICAL SUPPLY — 47 items
13mm burr hole plate ×1 IMPLANT
14MM BURR HOLE PERFORATOR ×2 IMPLANT
4mm 1.5mm self drilling screws ×3 IMPLANT
BANDAGE KERLIX 4.1YDX4.5IN 6 PLY ABS CRNKL WV LINT FREE COTTON LRG GAUZE STRL LF  DISP (WOUND CARE SUPPLY) ×3 IMPLANT
BANDAGE SFFRM 75X3IN CNFRM_STRCH RYN PLSTR GAUZE STRL LF (WOUND CARE/ENTEROSTOMAL SUPPLY) ×1
BLADE 10 BD RB-BCK CBNSTL SURG TISS STRL LF  DISP (SURGICAL CUTTING SUPPLIES) ×1 IMPLANT
BLADE 10 BD RB-BCK SHRP CBNSTL SURG TISS STRL LF (CUTTING ELEMENTS) ×1
BLADE 15 BD RB-BCK CBNSTL SURG STRL LF (CUTTING ELEMENTS) ×2
BLADE 15 BD RB-BCK CBNSTL SURG TISS STRL LF  DISP (SURGICAL CUTTING SUPPLIES) ×2 IMPLANT
CONV USE ITEM 338068 - KIT SURG SM STUP NONST DISP LF (CUSTOM TRAYS & PACK) ×1 IMPLANT
DISC USE ITEM 163322 - SYRINGE LL 20ML LTX STRL MED (NEEDLES & SYRINGE SUPPLIES) ×1 IMPLANT
DISCONTINUED USE 330324 - NEEDLE HYPO 22GA 1.5IN REG WL_SFGLD POLYPROP REG BVL LL HUB (NEEDLES & SYRINGE SUPPLIES) ×1 IMPLANT
DRAPE 2 INCS FILM ANTIMIC 23X17IN IOBN STRL SURG (PROTECTIVE PRODUCTS/GARMENTS) ×1 IMPLANT
DRAPE MICSCP ECON LEN 118X48IN OPM VISIONGUARD STRL EQP 65MM (DRAPE/PACKS/SHEETS/OR TOWEL) ×1 IMPLANT
DRAPE POUCH INSTRU 2 POCKET_1018 10EA/BX (EQUIPMENT MINOR) ×1
DRESS TRNSPR 4.75X4IN POLYUR ADH HYPOALL WTPRF TGDRM STD STRL LF (WOUND CARE SUPPLY) ×2 IMPLANT
HEMOSTAT ABS 3X2IN FLXB SRGCL_STRL (WOUND CARE SUPPLY) ×1 IMPLANT
HEMOSTAT ABS 4X4IN SRGCL FBRLR (WOUND CARE SUPPLY) ×1 IMPLANT
HEMOSTAT ABS 4X4IN SRGCL_FBRLR STRL DISP (WOUND CARE/ENTEROSTOMAL SUPPLY) ×2
KIT GUN RANEY CLIP DISP CG8901_PK/10 (KITS & TRAYS (DISPOSABLE)) ×1 IMPLANT
KIT HMST MLBL APPL DEHP-FR FLSL 13CM GELTN MATRIX THRMB LF  10ML (WOUND CARE SUPPLY) ×1 IMPLANT
KIT SURG SM STUP NONST DISP LF (CUSTOM TRAYS & PACK) ×1
NEEDLE BIOPSY NEEDLE PSV (NEEDLES & SYRINGE SUPPLIES) ×1 IMPLANT
NEEDLE BIOPSY PASSIVE (NEEDLES & SYRINGE SUPPLIES) ×1
NEEDLE HYPO  18GA 1.5IN REG WL SFGLD POLYPROP REG BVL LL SHIELD MECH DEHP-FR IM PNK STRL LF  DISP (NEEDLES & SYRINGE SUPPLIES) ×1 IMPLANT
NEEDLE HYPO 18GA 1.5IN REG WL SFGLD POLYPROP LL HUB SHIELD (NEEDLES & SYRINGE SUPPLIES) ×1
PACK SM SET UP_DYNJ906635 (CUSTOM TRAYS & PACK) ×1
PAD DRESS 4X3IN CURAD PLSTR COTTON ABS NONADH LF  STRL DISP (WOUND CARE SUPPLY) ×2 IMPLANT
PAD DRESS 4X3IN CURAD PLSTR CO_TTON ABS NONADH LF STRL DISP (WOUND CARE/ENTEROSTOMAL SUPPLY) ×2
POUCH 11X7IN 2 ADH STRP 2 CMPRT STRDRP INSTR PLASTIC STRL (EQUIPMENT MINOR) ×1 IMPLANT
SOL IRRG 0.9% NACL 500ML PLASTIC PR BTL ISTNC N-PYRG STRL LF (SOLUTIONS) ×1 IMPLANT
SPONGE ABS 12.5X8CM PORCINE GELTN SRGFM THK10MM STRL DISP (WOUND CARE SUPPLY) ×1 IMPLANT
SPONGE NEURO 1/2X1/2 PATTIE_LF (WOUND CARE/ENTEROSTOMAL SUPPLY) ×2
SPONGE SURG .5X.5IN PTT XRY NEURO DTBL (WOUND CARE SUPPLY) ×2 IMPLANT
SPONGE SURG 4X4IN 16 PLY XRY DETECT COTTON STRL LF  DISP (WOUND CARE SUPPLY) ×1 IMPLANT
SPONGE SURG 4X4IN 16 PLY_RADOPQ COT STRL LF DISP (WOUND CARE/ENTEROSTOMAL SUPPLY) ×1
SPONGE SURGIFOAM 8.5 X 12CM 1974 6EA/BX (WOUND CARE/ENTEROSTOMAL SUPPLY) ×1
STAPLER SKIN SIGNET 35W 054006 DISP 6EA/BX (ENDOSCOPIC SUPPLIES) ×1 IMPLANT
SUTURE 2-0 CT2 VICRYL 27IN VIOL BRD COAT ABS (SUTURE/WOUND CLOSURE) ×1 IMPLANT
TRAY CATH 16FR LUBRISIL STATLK_SAF-FLOW FOLEY URMTR ADV (TRAY) ×1 IMPLANT
TRAY SKIN SCRUB 8IN VNYL COTTON 6 WNG 6 SPONGE STICK 2 TIP APPL DRY STRL LF (KITS & TRAYS (DISPOSABLE)) ×2 IMPLANT
TRAY STERILIZATION SPHERES (SURGICAL INSTRUMENTS) ×3 IMPLANT
TRAY SURG PREP SCR CR ESTM (KITS & TRAYS (DISPOSABLE)) ×2
TUBING SUCT 12FT COIL FLXB STRL LTX (Drains/Resovoirs) ×1 IMPLANT
WAX BONE 2.5G NON ABS INDF NTR (SUTURE/WOUND CLOSURE) ×1
WAX BONE 2.5G STRL NATURAL (SUTURE/WOUND CLOSURE) ×1 IMPLANT
XBANDAGE SFFRM 75X3IN CNFRM_STRCH RYN PLSTR GAUZE STRL LFX (WOUND CARE SUPPLY) ×1 IMPLANT

## 2020-07-02 NOTE — Ancillary Notes (Signed)
Note from Dietitian:   Pt screened for NPO/CL diet status. Per chart, pt has only been NPO since midnight for planned procedure today in the OR; previously on a Cardiac diet. RD colleague already following as well. Thank You-   Rhona Raider, RDLD  10:33 07/02/2020

## 2020-07-02 NOTE — OR Surgeon (Signed)
PATIENT NAME: Carrie Escobar, PROFIT [facility] NUMBER:  [id number]  DATE OF SERVICE: 07/02/2020  DATE OF BIRTH:  1946-10-02    OPERATIVE REPORT    PREOPERATIVE DIAGNOSIS:  Left temporal brain tumor.    POSTOPERATIVE DIAGNOSIS:  Left temporal brain tumor.    NAME OF PROCEDURE:  Left temporal Stealth guided bur hole and tumor biopsy.    SURGEON:  Particia Jasper, MD.    ASSISTANT:  Tonita Phoenix, PA.    ANESTHESIA:  General.    COMPLICATIONS:  None.    DISPOSITION:  PACU.    INDICATIONS FOR PROCEDURE:  Ms. Brielynn Sekula is a 74 year old female who presented to the hospital with progressive confusion, memory issues.  MR imaging revealed a large left deep temporal lesion as well as a left occipital lesion.  Discussion was made for biopsy.  Risks and benefits were discussed with the patient and her son, which include but are not limited to hematoma, infection, wound healing problems, CSF leak, nondiagnostic biopsy, seizure, stroke, damage local tissue, need for further surgery.  The patient understood the risks and wished to proceed.    DESCRIPTION OF PROCEDURE:  This 74 year old female was transferred to the operating room where appropriate consents were obtained.  She was placed in supine position on the table, placed under anesthesia and intubated.  The patient then had 3-pin Mayfield head frame placed.  The head was locked into position.  Stealth neuronavigation was then registered.  The prior entry point and target had been laid out just above the left ear.  This was confirmed with Stealth navigation.  The head was shaved appropriately.  A curvilinear type incision was drawn.  The patient was then prepped and draped in standard surgical fashion.     After which approximately 5 mL of 1% lidocaine with epinephrine was injected into the dermis.  After which a 10 blade knife was used to incise from the epidermis down to the dermis.  Weitlaner retractor was placed.  Bipolar electrocautery was used for adequate  hemostasis.  Monopolar was then used to dissect the temporalis fascia and musculature.  Paris elevator was then used to elevate the temporalis muscle.  A cerebellar retractor was then replaced.  Perforator bur hole was used to place an adequate bur hole.  Stealth neuronavigation confirmed our entry point was right in the center of our bur hole.  After which the remainder of the inner table was removed.  Bipolar electrocautery was used for adequate hemostasis, in combination with FloSeal.  After which the dura was coagulated and opened in a cruciate fashion with a 15 blade.  The dural leaves were coagulated.  The cortex was coagulated and cut.  The entry point was reset to the brain surface.  The Vertek  arm had been previously set up and was lined up for subcentimeter placement within the target.  The initial target was just at the edge of the tumor, where there was the primary area of enhancement.  Biopsy was taken and specimen was sent for frozen pathology and found to be lesional.  Additional superficial as well as deep biopsies within the tumor were taken and also sent for permanent pathology.  The biopsy needle was then removed.  Bipolar electrocautery was used for adequate hemostasis.  Gelfoam and thrombin were placed in the bur hole.  A bur hole cover was then placed with the Stryker low-lying plating system.  The area was then copiously irrigated with bacitracin laden solution.  Bipolar electrocautery was used to  coagulate the musculature.  After which the temporalis fascia muscle was closed with #2 Vicryl sutures.  The deep dermis was closed with 3-0 Vicryl sutures.  The skin was closed with staples.  The skin was then cleaned and dried and a standard Primapore dressing was applied.  The 3-pin Mayfield head frame was removed.  The patient was extubated and transferred to PACU in good stable condition.  The needle, instrument, and patty counts were correct at the end of the case.    Tonita Phoenix, [place],  whose?assistance was vital for the success of the operation, especially in regard to suction, retraction, manipulation of neural elements, protection of all soft tissues, assistance with instrumentation implantation, and wound closure. His personal presence was also necessary since no residents were available, therefore, a skilled neurosurgery PA (physician assistant) was required.    Particia Jasper, MD                DD:  07/02/2020 13:01:58  DT:  07/02/2020 17:15:41 LB  D#:  [id number]

## 2020-07-02 NOTE — Nurses Notes (Signed)
Received orders to hold Norvasc and eye drops d/t heart rate per Dr.Juneja. Will continue to monitor.

## 2020-07-02 NOTE — Nurses Notes (Signed)
07/02/20 1559   Vital Signs   Heart Rate 48   Respiratory Rate 14   BP (Non-Invasive) (!) 144/65   BP Source (Non-Invasive) C;RA   Patient Position Semi-fowler   Oxygen Therapy   SpO2 100 %   O2 Delivery RA         Tele room called and stated patient had a run of SVT. Patient resting and this RN asks if patient has pain and she points to her incision on the left side of her head. EKG order placed per protocol. Will inform MD and continue to monitor.

## 2020-07-02 NOTE — PT Treatment (Signed)
Pt in surgery will follow up post surgery.     Noralee Chars, PTA

## 2020-07-02 NOTE — OT Treatment (Signed)
OT attempted to see the pt. For treatment, however she was sleeping and did not arouse to her name. Pt. Underwent L brain biopsy today so pt. OT will attempt again tomorrow as the pt. Is able.     Karl Bales, OT  07/02/2020, 18:08

## 2020-07-02 NOTE — Brief Op Note (Signed)
[facility]                                          BRIEF OPERATIVE NOTE    Patient Name: Carrie Escobar, Carrie Escobar [facility] Number: [id number]  Encounter number: [id number]  Date of Service: 07/02/2020   Date of Birth: 1945/12/12      Pre-Operative Diagnosis: Left temporal mass     Post-Operative Diagnosis: Left temporal mass    Procedure(s)/Description:  Procedure(s):  Left - Left temporal Stealth guided burr hole and tumor biopsy - Wound Class: Clean Wound: Uninfected operative wounds in which no inflammation occurred    Findings: Lesional tissue    Attending Surgeon: Particia Jasper, MD    Assistant(s):  Tonita Phoenix, PA    Anesthesia Type: General    Estimated Blood Loss:  Minimal    Specimens removed  Tumor biopsy    Complications:  None           Disposition: PACU - hemodynamically stable.           Condition: stable    Patient is at increased risk for surgical bleeding : Yes - hold low dose unfractionated heparin/low molecular weight heparin    Patient is on postop antibiotic regimen > 24 hours:  Yes due to  prophylaxis for bone surgery    Beta Blocker:  N/A    Particia Jasper, MD

## 2020-07-02 NOTE — Nurses Notes (Signed)
Report given to [facility], RN for patient to go to 603.

## 2020-07-02 NOTE — Pharmacy (Signed)
[place] IV to PO Changes: Levetiracetam   Carrie Escobar, Carrie Escobar, 74 y.o. female  Date of Birth:  [birth year]  Date of Admission:  06/27/2020  MRN# [id number]    Per the [facility] IV to PO Policy, the following changes have been made:    Original Order: Levetiracetam 1000 mg , IV, Every 12 hours    New Order: Levetiracetam 1000 mg , Oral, 2 times daily    Please contact pharmacy with any questions.    [address] [place], [place]   07/02/2020, 10:14

## 2020-07-02 NOTE — Care Plan (Signed)
Problem: Adult Inpatient Plan of Care  Goal: Plan of Care Review  07/02/2020 0014 by Celestia Khat, RN  Outcome: Ongoing (see interventions/notes)  07/02/2020 0013 by Celestia Khat, RN  Outcome: Ongoing (see interventions/notes)  Goal: Patient-Specific Goal (Individualized)  07/02/2020 0014 by Celestia Khat, RN  Outcome: Ongoing (see interventions/notes)  07/02/2020 0013 by Celestia Khat, RN  Outcome: Ongoing (see interventions/notes)  Goal: Absence of Hospital-Acquired Illness or Injury  07/02/2020 0014 by Celestia Khat, RN  Outcome: Ongoing (see interventions/notes)  07/02/2020 0013 by Celestia Khat, RN  Outcome: Ongoing (see interventions/notes)  Goal: Optimal Comfort and Wellbeing  07/02/2020 0014 by Celestia Khat, RN  Outcome: Ongoing (see interventions/notes)  07/02/2020 0013 by Celestia Khat, RN  Outcome: Ongoing (see interventions/notes)  Goal: Rounds/Family Conference  07/02/2020 0014 by Celestia Khat, RN  Outcome: Ongoing (see interventions/notes)  07/02/2020 0013 by Celestia Khat, RN  Outcome: Ongoing (see interventions/notes)     Problem: Seizure, Active Management  Goal: Absence of Seizure/Seizure-Related Injury  07/02/2020 0014 by Celestia Khat, RN  Outcome: Ongoing (see interventions/notes)  07/02/2020 0013 by Celestia Khat, RN  Outcome: Ongoing (see interventions/notes)     Problem: Diabetes Comorbidity  Goal: Blood Glucose Level Within Targeted Range  07/02/2020 0014 by Celestia Khat, RN  Outcome: Ongoing (see interventions/notes)  07/02/2020 0013 by Celestia Khat, RN  Outcome: Ongoing (see interventions/notes)     Problem: Hypertension Comorbidity  Goal: Blood Pressure in Desired Range  07/02/2020 0014 by Celestia Khat, RN  Outcome: Ongoing (see interventions/notes)  07/02/2020 0013 by Celestia Khat, RN  Outcome: Ongoing (see interventions/notes)     Problem: Cognitive Impairment  Goal: Optimal Functional Independence  07/02/2020 0014 by Celestia Khat, RN  Outcome: Ongoing (see interventions/notes)  07/02/2020 0013 by Celestia Khat, RN  Outcome: Ongoing (see  interventions/notes)     Problem: Coping Ineffective (Oncology Care)  Goal: Effective Coping  07/02/2020 0014 by Celestia Khat, RN  Outcome: Ongoing (see interventions/notes)  07/02/2020 0013 by Celestia Khat, RN  Outcome: Ongoing (see interventions/notes)     Problem: Fatigue (Oncology Care)  Goal: Improved Activity Tolerance  07/02/2020 0014 by Celestia Khat, RN  Outcome: Ongoing (see interventions/notes)  07/02/2020 0013 by Celestia Khat, RN  Outcome: Ongoing (see interventions/notes)     Problem: Oral Intake Altered (Oncology Care)  Goal: Optimal Oral Intake  07/02/2020 0014 by Celestia Khat, RN  Outcome: Ongoing (see interventions/notes)  07/02/2020 0013 by Celestia Khat, RN  Outcome: Ongoing (see interventions/notes)     Problem: Acute Neurologic Deterioration  Goal: Absence of Acute Neurologic Symptoms  07/02/2020 0014 by Celestia Khat, RN  Outcome: Ongoing (see interventions/notes)  07/02/2020 0013 by Celestia Khat, RN  Outcome: Ongoing (see interventions/notes)     Problem: Skin Injury Risk Increased  Goal: Skin Health and Integrity  07/02/2020 0014 by Celestia Khat, RN  Outcome: Ongoing (see interventions/notes)  07/02/2020 0013 by Celestia Khat, RN  Outcome: Ongoing (see interventions/notes)

## 2020-07-02 NOTE — Anesthesia Preprocedure Evaluation (Addendum)
ANESTHESIA PRE-OP EVALUATION  Planned Procedure: Left temporal Stealth guided burr hole and tumor biopsy (Left )  Review of Systems    PONV       patient summary reviewed  nursing notes reviewed        Pulmonary     Cardiovascular    Hypertension ,No peripheral edema,        GI/Hepatic/Renal        Endo/Other    obesity,       Neuro/Psych/MS    Brain cancer/Mass and seizures     Cancer  CA,                    Physical Assessment      Patient summary reviewed and Nursing notes reviewed   Airway       Mallampati: II    TM distance: >3 FB    Neck ROM: full  Mouth Opening: fair.      No endotracheal tube present  No Tracheostomy present    Dental       Dentition intact             Pulmonary    Breath sounds clear to auscultation  (-) no rhonchi, no decreased breath sounds, no wheezes, no rales and no stridor     Cardiovascular    Rhythm: regular  Rate: Normal  (-) no friction rub, carotid bruit is not present, no peripheral edema and no murmur     Other findings            Plan  ASA 3     Planned anesthesia type: general endotracheal              Intravenous induction     Anesthesia issues/risks discussed are: Dental Injuries, Cardiac Events/MI, Stroke and Aspiration.  Anesthetic plan and risks discussed with patient.          Patient's NPO status is appropriate for Anesthesia.           Plan discussed with CRNA.

## 2020-07-02 NOTE — Nurses Notes (Signed)
Received order from MD to transfer to tele floor.

## 2020-07-02 NOTE — Nurses Notes (Signed)
Report given to [employer], Therapist, sports. Patient taken down to pre-op. Brother at bedside.

## 2020-07-02 NOTE — Nurses Notes (Signed)
Traveled with pt to CT accompanied by another RN. Pt VS stable throughout, no c/o pain at this time.

## 2020-07-02 NOTE — Nurses Notes (Signed)
Pt arrived to rm 603.

## 2020-07-02 NOTE — Nurses Notes (Signed)
07/02/20 1636   Vital Signs   Heart Rate 47   Respiratory Rate 16   BP (Non-Invasive) (!) 148/56   BP Source (Non-Invasive) C;RA   Patient Position Semi-fowler   Oxygen Therapy   SpO2 100 %   O2 Delivery RA         MD informed of bradycardia. Patient is sleeping. Will continue to monitor.

## 2020-07-02 NOTE — Nurses Notes (Signed)
Pt is alert, oriented to self only. Patient watches TV and engages staff in conversations. Denies presence of pain. 1 assist with ADLs. Ambulates to bathroom. NPO @midnight  pending brain biopsy. Surgical bath completed. SCDs in place. Continues on IV Decadron, Keppra nad IV ABT as ordered. Sinus brady on tele.

## 2020-07-02 NOTE — Anesthesia Transfer of Care (Signed)
ANESTHESIA TRANSFER OF CARE   Carrie Escobar is a 74 y.o. ,female, Weight: 65 kg (143 lb 4.8 oz)   had Procedure(s):  Left temporal Stealth guided burr hole and tumor biopsy  performed  07/02/20   Primary Service: Peter Garter, *    Past Medical History:   Diagnosis Date    Acute metabolic encephalopathy 81/44/8185    Brain mass 06/29/2020    MRI: Multiple enhancing masses in the left parietal, temporal, and occipital lobes which are connected by masslike T2 FLAIR signal abnormality.  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  Largest mass extends along the ependymal lining of left lateral ventricle.    Breast mass, right     benign    Cancer (CMS HCC)     Cataract     bilat    Diabetes mellitus, type 2 (CMS HCC)     Family history of colon cancer     Heartburn     HTN (hypertension)     Memory impairment 06/30/2020    Nausea with vomiting     sister with PONV    Obesity     Pulmonary emboli     only once in 1999 after her hysterectomy    Thyroid nodule     left     Wears glasses     reading      Allergy History as of 07/02/20     AMOXICILLIN       Noted Status Severity Type Reaction    02/26/13 1609 Llewellyn, [place]brand[place], MA 02/26/13 Active Medium  Rash              I completed my transfer of care / handoff to the receiving personnel during which we discussed:  Access, Airway, All key/critical aspects of case discussed, Analgesia, Antibiotics, Expectation of post procedure, Fluids/Product, Gave opportunity for questions and acknowledgement of understanding, Labs and PMHx    Post Location: PACU                                                                  Last OR Temp: Temperature: 36 C (96.8 F)  ABG:  POTASSIUM   Date Value Ref Range Status   07/02/2020 3.5 3.5 - 5.1 mmol/L Final   03/10/2015 4.7 3.5 - 5.0 mmol/L Final     KETONES   Date Value Ref Range Status   06/27/2020 Negative Negative mg/dL Final     CALCIUM   Date Value Ref Range Status   07/02/2020 9.4 8.8 - 10.2 mg/dL  Final   03/10/2015 9.6 8.5 - 10.5 mg/dL Final     Calculated P Axis   Date Value Ref Range Status   06/27/2020 59 degrees Final     Calculated R Axis   Date Value Ref Range Status   06/27/2020 -2 degrees Final     Calculated T Axis   Date Value Ref Range Status   06/27/2020 32 degrees Final     Airway:* No LDAs found *  Blood pressure 133/78, pulse (!) 102, temperature 36 C (96.8 F), resp. rate 16, height 1.651 m (5\' 5" ), weight 65 kg (143 lb 4.8 oz), SpO2 95 %, not currently breastfeeding.

## 2020-07-02 NOTE — Care Plan (Signed)
Problem: Adult Inpatient Plan of Care  Goal: Plan of Care Review  Outcome: Ongoing (see interventions/notes)  Goal: Patient-Specific Goal (Individualized)  Outcome: Ongoing (see interventions/notes)  Goal: Absence of Hospital-Acquired Illness or Injury  Outcome: Ongoing (see interventions/notes)  Goal: Optimal Comfort and Wellbeing  Outcome: Ongoing (see interventions/notes)  Goal: Rounds/Family Conference  Outcome: Ongoing (see interventions/notes)     Problem: Seizure, Active Management  Goal: Absence of Seizure/Seizure-Related Injury  Outcome: Ongoing (see interventions/notes)     Problem: Diabetes Comorbidity  Goal: Blood Glucose Level Within Targeted Range  Outcome: Ongoing (see interventions/notes)     Problem: Hypertension Comorbidity  Goal: Blood Pressure in Desired Range  Outcome: Ongoing (see interventions/notes)     Problem: Cognitive Impairment  Goal: Optimal Functional Independence  Outcome: Ongoing (see interventions/notes)     Problem: Coping Ineffective (Oncology Care)  Goal: Effective Coping  Outcome: Ongoing (see interventions/notes)     Problem: Fatigue (Oncology Care)  Goal: Improved Activity Tolerance  Outcome: Ongoing (see interventions/notes)     Problem: Oral Intake Altered (Oncology Care)  Goal: Optimal Oral Intake  Outcome: Ongoing (see interventions/notes)     Problem: Acute Neurologic Deterioration  Goal: Absence of Acute Neurologic Symptoms  Outcome: Ongoing (see interventions/notes)     Problem: Skin Injury Risk Increased  Goal: Skin Health and Integrity  Outcome: Ongoing (see interventions/notes)

## 2020-07-02 NOTE — Progress Notes (Signed)
[facility]  NEUROSURGERY   PROGRESS NOTE      Carrie Escobar, Carrie Escobar, 74 y.o. female  Date of Admission:  06/27/2020  Date of Service: 07/02/2020  Date of Birth:  1946-03-02    Referring Physician:  No ref. provider found    Post Op Day: Day of Surgery S/P Procedure(s) (LRB):  Left temporal Stealth guided burr hole and tumor biopsy (Left)    Chief Complaint: Brain mass  Subjective: Carrie Escobar is a 74 y.o., right handed 56 American female who is now POD #0 s/p left temporal stealth guided burr hole and 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 She initially presented to the ER with progressive confusion, memory loss, and forgetfulness. She still has confusion but no change from her baseline. She states she has some pain over the incision which she rates as a 4/10. States she otherwise "feels fine". Denies nausea, vomiting, dizziness, or visual changes.    Vital Signs:  Temp (24hrs) Max:36.8 C (73.4 F)      Systolic (28JGO), TLX:726 , Min:131 , OMB:559     Diastolic (74BUL), AGT:36, Min:61, Max:86    Temp  Avg: 36.2 C (97.2 F)  Min: 36 C (96.8 F)  Max: 36.8 C (98.2 F)  MAP (Non-Invasive)  Avg: 90.4 mmHG  Min: 82 mmHG  Max: 105 mmHG  Pulse  Avg: 62.5  Min: 48  Max: 102  Resp  Avg: 15.6  Min: 12  Max: 18  SpO2  Avg: 97.9 %  Min: 95 %  Max: 100 %       Min/Max/Avg ICP/CPP last 24hrs:   No data recorded    Today's Physical Exam:  Constitutional: no distress  Eyes: Conjunctiva clear.  ENT: ENMT without erythema or injection, mucous membranes moist.  Neck: supple, symmetrical, trachea midline  Respiratory: Clear to auscultation bilaterally.   Cardiovascular: regular rate and rhythm  Gastrointestinal: Soft, non-tender  Musculoskeletal: 5/5 in both upper and lower extremities  Integumentary:  Skin warm and dry and No rashes  Neurologic: CN II - XII grossly intact , Mental status: alert, oriented, thought content appropriate and A/O x 1. She is confused. Having difficulty following  commands. Coordination is intact. Pronator drift is negative.   Lymphatic/Immunologic/Hematologic: No lymphadenopathy  Psychiatric: Normal    Surgical incision over left temporal region with staples in place and dressing over wound.      Current Medications:  amLODIPine (NORVASC) tablet, 10 mg, Oral, Daily  ceFAZolin (ANCEF) 1 g in iso-osmotic 50 mL premix IVPB, 1 g, Intravenous, Q8H  dexamethasone 4 mg/mL injection, 4 mg, Intravenous, Q6H  fluticasone (FLONASE) 50 mcg per spray nasal spray, 1 Spray, Each Nostril, Daily  HYDROcodone-acetaminophen (NORCO) 5-325 mg per tablet, 1 Tablet, Oral, Q4H PRN  levETIRAcetam (KEPPRA) tablet, 1,000 mg, Oral, 2x/day  LORazepam (ATIVAN) 2 mg/mL injection, 2 mg, Intravenous, Q4H PRN  metFORMIN (GLUCOPHAGE XR) extended release tablet, 500 mg, Oral, Daily  morphine 4 mg/mL injection, 2 mg, Intravenous, Q4H PRN  NS 250 mL flush bag, , Intravenous, Q1H PRN  NS flush syringe, 10 mL, Intravenous, Q8HRS  NS flush syringe, 10 mL, Intravenous, Q1H PRN  SSIP insulin lispro (HUMALOG) 100 units/mL SubQ pen, 1-12 Units, Subcutaneous, 4x/day AC  timolol (TIMOPTIC) 0.25% ophthalmic solution, 1 Drop, Right Eye, 2x/day        I/O:  I/O last 24 hours:    Intake/Output Summary (Last 24 hours) at 07/02/2020 1603  Last data filed at 07/02/2020 1258  Gross  per 24 hour   Intake 700 ml   Output 230 ml   Net 470 ml     I/O current shift:  09/23 0700 - 09/23 1859  In: 700 [I.V.:700]  Out: 230 [Urine:200; Blood:30]    Antibiotics: Date Started Date Completed   1. Ancef 07/02/2020    2.     3.     4.       Nutrition/Residuals:  DIET DIABETIC/ADA Ensure High Protein; Ensure HP once daily with lunch    Labs  Please indicate ordered or reviewed)  Reviewed:   Lab Results for Last 24 Hours:    Results for orders placed or performed during the hospital encounter of 06/27/20 (from the past 24 hour(s))   POC FINGERSTICK GLUCOSE - [facility] (RESULTS)   Result Value Ref Range    GLUCOSE, POC 159 (H) 60 - 100 mg/dl   POC  FINGERSTICK GLUCOSE - [facility] (RESULTS)   Result Value Ref Range    GLUCOSE, POC 137 (H) 60 - 100 mg/dl   BASIC METABOLIC PANEL - AM ONCE   Result Value Ref Range    SODIUM 141 136 - 145 mmol/L    POTASSIUM 3.5 3.5 - 5.1 mmol/L    CHLORIDE 107 96 - 111 mmol/L    CO2 TOTAL 23 23 - 31 mmol/L    ANION GAP 11 4 - 13 mmol/L    CALCIUM 9.4 8.8 - 10.2 mg/dL    GLUCOSE 143 (H) 65 - 125 mg/dL    BUN 17 8 - 25 mg/dL    CREATININE 0.78 0.60 - 1.05 mg/dL    BUN/CREA RATIO 22 6 - 22    ESTIMATED GFR 88 >=60 mL/min/BSA   CBC WITH DIFF   Result Value Ref Range    WBC 13.0 (H) 4.0 - 11.0 x103/uL    RBC 4.67 4.00 - 5.10 x106/uL    HGB 13.6 12.0 - 15.5 g/dL    HCT 39.7 36.0 - 45.0 %    MCV 85.2 82.0 - 97.0 fL    MCH 29.2 27.5 - 33.2 pg    MCHC 34.3 32.0 - 36.0 g/dL    RDW 14.7 11.0 - 16.0 %    PLATELETS 192 150 - 450 x103/uL    MPV 9.1 7.4 - 10.5 fL    NEUTROPHIL % 90 (H) 43 - 76 %    LYMPHOCYTE % 7 (L) 15 - 43 %    MONOCYTE % 3 (L) 5 - 12 %    EOSINOPHIL % 0 0 - 5 %    BASOPHIL % 0 0 - 3 %    NEUTROPHIL # 11.70 (H) 1.50 - 6.50 x103/uL    LYMPHOCYTE # 0.90 (L) 1.00 - 4.80 x103/uL    MONOCYTE # 0.40 0.20 - 0.90 x103/uL    EOSINOPHIL # 0.00 0.00 - 0.50 x103/uL    BASOPHIL # 0.00 0.00 - 0.10 x103/uL   POC FINGERSTICK GLUCOSE - [facility] (RESULTS)   Result Value Ref Range    GLUCOSE, POC 132 (H) 60 - 100 mg/dl     Ordered:  CBC, BMP     Diagnostic Tests (Please indicate ordered or reviewed)  Reviewed: None  Ordered:  None    Radiology Tests (Please indicate ordered or reviewed)  Reviewed: CT:   Direct visualization of the image on 07/02/2020 on [place] PACS showed:  Post-op CT did not demonstrate any acute bleeding. There were acute postsurgical changes consisting of air at the biopsy site. No change in  vasogenic edema or mass effect.  Ordered:  None    Assessment/ Plan:  Active Hospital Problems   (*Primary Problem)    Diagnosis    *Brain mass    Memory impairment    Acute metabolic encephalopathy       DVT/PE Prophylaxis: SCDs/  Venodynes/Impulse boots  GI Prophylaxis: Not indicated    Patient/ Family Discussion:   Carrie Escobar is a 74 y.o., right handed 70 American female who is now POD #0 s/p left temporal stealth guided burr hole and 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 She is doing well post-operatively. Pain is a 4/10 over the incisional site. She is sleepy and has her baseline confusion. Examination reveals full strength and sensation throughout. No pronator drift or cranial nerve deficits.     CT Brain in the immediate post-op phase did not demonstrate any acute hemorrhage. Patient was transferred to the 4th floor for continued care. Patient does not have any restrictions and is able to get out of bed. Continue Ancef 1g q8h and dexamethasone 4mg  q6h. Also continue Keppra 1000 mg BID for seizure prophylaxis. Q2h neuro checks.     I personally saw and evaluated the patient. See mid-level's note for additional details.     Particia Jasper, MD

## 2020-07-02 NOTE — Progress Notes (Signed)
Carrie Escobar  [name],  [id number]    INPATIENT PROGRESS NOTE      Carrie Escobar, Carrie Escobar  Date of Admission:  06/27/2020  Date of Birth:  08-Jan-1946  Date of Service:  07/02/2020         Assessment/ Plan:       Intracranial masses:  Multi focal. Concerning for glioblastoma v Lymphoma v Mets. Associated with weakness, memory impairment and lethargy.   -CT chest/Abd/Plv only showing a thyroid mass. [place] of thyroid ordered.  - s/p off ASA x 5 days.   - s/p Biopsy today 07/02/20.   - Continue on prophylactic Keppra IV. Continue Decadron. Continue  Neuro checks.  Appreciate recommendation from Neurology, Neurosurgery, and Oncology. Falls, seizure, and aspiration precautions. Supportive care as needed.    Abnormal EEG result: Delta wave noted concerning for seizure potential. Keppra to continue per Dr Reesa Chew. Seizure precautions. Fall precaution. IV ativan for breakthrough seizure. Supportive care.     Memory impairment/Confusion: Subacute. Ongoing for several weeks. Worsening.  Likely due to above diagnoses. Treat as outlined above. Further evaluation diagnosis as outlined above.    Lethargy/acute metabolic encephalopathy: Subacute.  May be emanating from above diagnosis.  Treat as outlined above. PT OT evaluation and treatment.  Fall precautions.    Hypertension: Benign. Shy of goal. Resume oral regimen. Adjust as needed.       Diabetes mellitus: Type 2. Patient on Metformin. Will continue same. Sliding scale insulin coverage as needed. Fingersticks.      Bradycardia  - EKG reviewed by myself, showing NSR, sinus bradycardia, no acute ST/T wave changes.   - d/c timolol eye drops  - hold Norvasc  - continue with cardiac monitoring.      Prophylaxis:  SCD      Disposition:  Home when workup and treatment are completed.          Chief Complaint:  Intracranial Masses, sleepiness, forgetfulness, fatigue.      Subjective: pt seen after her biopsy today. Lethargic, having bradycardia. D/w  RN.      12 point ROS have been negative except as mentioned          Today's Physical Exam:  BP (!) 142/66    Pulse 48    Temp 36.3 C (97.3 F)    Resp 18    Ht 1.651 m (5\' 5" )    Wt 65 kg (143 lb 4.8 oz)    SpO2 100%    BMI 23.85 kg/m       General: lethargic, No acute distress.   Eyes: Pupils equal and round, reactive to light and accomodation.   HENT:Head atraumatic and normocephalic   Neck: No JVD or thyromegaly or lymphadenopathy   Lungs: Clear to auscultation bilaterally.   Cardiovascular: bradycardia. Regular rhythm, S1, S2 normal, no murmur,   Abdomen: Soft, non-tender, Bowel sounds normal, No hepatosplenomegaly   Extremities: extremities normal, atraumatic, no cyanosis or edema   Skin: Skin warm and dry   Neurologic: lethargic.   Lymphatics: No lymphadenopathy   Psychiatric: lethargic    Current Medications:  amLODIPine (NORVASC) tablet, 10 mg, Oral, Daily  cefTRIAXone (ROCEPHIN) 2 g in NS 50 mL IVPB minibag, 2 g, Intravenous, Q24H  dexamethasone 4 mg/mL injection, 4 mg, Intravenous, Q6H  fluticasone (FLONASE) 50 mcg per spray nasal spray, 1 Spray, Each Nostril, Daily  levETIRAcetam (KEPPRA) 1000 mg in iso-osmotic 100 mL premix IVPB, 1,000 mg, Intravenous, Q12H  LORazepam (ATIVAN) 2 mg/mL injection, 2 mg,  Intravenous, Q4H PRN  [Held by provider] metFORMIN (GLUCOPHAGE XR) extended release tablet, 500 mg, Oral, Daily  SSIP insulin lispro (HUMALOG) 100 units/mL SubQ pen, 1-12 Units, Subcutaneous, 4x/day AC  timolol (TIMOPTIC) 0.25% ophthalmic solution, 1 Drop, Right Eye, 2x/day          I/O:  I/O last 24 hours:      Intake/Output Summary (Last 24 hours) at 07/02/2020 0739  Last data filed at 07/01/2020 1200  Gross per 24 hour   Intake 360 ml   Output --   Net 360 ml     I/O current shift:  No intake/output data recorded.      Laboratory Results:    Reviewed:   Results for orders placed or performed during the hospital encounter of 06/27/20 (from the past 24 hour(s))   POC FINGERSTICK GLUCOSE - [facility]  (RESULTS)   Result Value Ref Range    GLUCOSE, POC 133 (H) 60 - 100 mg/dl   POC FINGERSTICK GLUCOSE - [facility] (RESULTS)   Result Value Ref Range    GLUCOSE, POC 159 (H) 60 - 100 mg/dl   POC FINGERSTICK GLUCOSE - [facility] (RESULTS)   Result Value Ref Range    GLUCOSE, POC 137 (H) 60 - 100 mg/dl   BASIC METABOLIC PANEL - AM ONCE   Result Value Ref Range    SODIUM 141 136 - 145 mmol/L    POTASSIUM 3.5 3.5 - 5.1 mmol/L    CHLORIDE 107 96 - 111 mmol/L    CO2 TOTAL 23 23 - 31 mmol/L    ANION GAP 11 4 - 13 mmol/L    CALCIUM 9.4 8.8 - 10.2 mg/dL    GLUCOSE 143 (H) 65 - 125 mg/dL    BUN 17 8 - 25 mg/dL    CREATININE 0.78 0.60 - 1.05 mg/dL    BUN/CREA RATIO 22 6 - 22    ESTIMATED GFR 88 >=60 mL/min/BSA   CBC WITH DIFF   Result Value Ref Range    WBC 13.0 (H) 4.0 - 11.0 x103/uL    RBC 4.67 4.00 - 5.10 x106/uL    HGB 13.6 12.0 - 15.5 g/dL    HCT 39.7 36.0 - 45.0 %    MCV 85.2 82.0 - 97.0 fL    MCH 29.2 27.5 - 33.2 pg    MCHC 34.3 32.0 - 36.0 g/dL    RDW 14.7 11.0 - 16.0 %    PLATELETS 192 150 - 450 x103/uL    MPV 9.1 7.4 - 10.5 fL    NEUTROPHIL % 90 (H) 43 - 76 %    LYMPHOCYTE % 7 (L) 15 - 43 %    MONOCYTE % 3 (L) 5 - 12 %    EOSINOPHIL % 0 0 - 5 %    BASOPHIL % 0 0 - 3 %    NEUTROPHIL # 11.70 (H) 1.50 - 6.50 x103/uL    LYMPHOCYTE # 0.90 (L) 1.00 - 4.80 x103/uL    MONOCYTE # 0.40 0.20 - 0.90 x103/uL    EOSINOPHIL # 0.00 0.00 - 0.50 x103/uL    BASOPHIL # 0.00 0.00 - 0.10 x103/uL   POC FINGERSTICK GLUCOSE - [facility] (RESULTS)   Result Value Ref Range    GLUCOSE, POC 132 (H) 60 - 100 mg/dl     Ordered:    Lab orders   No lab orders         Radiological Studies:  Reviewed:   Results for orders placed or performed during the hospital encounter  of 06/27/20 (from the past 24 hour(s))   CT BRAIN STEALTH W CONTRAST     Status: None    Narrative    Carrie Escobar  Female, 74 years old.    CT BRAIN STEALTH W CONTRAST performed on 07/01/2020 9:30 AM.    REASON FOR EXAM:  Pre-op    RADIATION DOSE: 1189.4 DLP  (mGycm)    CONTRAST: 84 ml's of Isovue 370    COMPARISON: MRI brain 06/28/2020.    FINDINGS:     Redemonstration of large mass centered in the left temporoparietal region.  There is also a smaller focus of vasogenic edema in the left occipital  region. Findings are consistent known multifocal disease. There is  associated mass effect with partial effacement of the left lateral  ventricle and 4 mm left-to-right midline shift, similar to prior study. The  calvarium is intact. The sinuses and mastoids are clear.      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          Radiologist location ID: [id number]       Ordered:  CT BRAIN WO IV CONTRAST  XR AP MOBILE CHEST  MRI BRAIN W/WO CONTRAST  CT CHEST ABDOMEN PELVIS W IV CONTRAST  MRI BRAIN STEALTH W/WO CONTRAST  US THYROID  CT BRAIN STEALTH W CONTRAST    Problem List:  Active Hospital Problems   (*Primary Problem)    Diagnosis    *Brain mass    Memory impairment    Acute metabolic encephalopathy         Gillis Santa, MD, 07/02/2020,  07:39

## 2020-07-02 NOTE — Care Plan (Signed)
Problem: Adult Inpatient Plan of Care  Goal: Plan of Care Review  Outcome: Ongoing (see interventions/notes)  Flowsheets (Taken 07/02/2020 1635)  Plan of Care Reviewed With:   patient   family     Problem: Seizure, Active Management  Goal: Absence of Seizure/Seizure-Related Injury  Note: Seizure pads  Intervention: Prevent Seizure-Related Injury  Flowsheets (Taken 07/02/2020 1635)  Seizure Precautions:   activity supervised   side rails padded

## 2020-07-03 ENCOUNTER — Inpatient Hospital Stay (HOSPITAL_COMMUNITY): Payer: Medicare PPO

## 2020-07-03 DIAGNOSIS — E119 Type 2 diabetes mellitus without complications: Secondary | ICD-10-CM

## 2020-07-03 DIAGNOSIS — I10 Essential (primary) hypertension: Secondary | ICD-10-CM

## 2020-07-03 DIAGNOSIS — G939 Disorder of brain, unspecified: Secondary | ICD-10-CM

## 2020-07-03 DIAGNOSIS — G9389 Other specified disorders of brain: Secondary | ICD-10-CM

## 2020-07-03 DIAGNOSIS — G936 Cerebral edema: Secondary | ICD-10-CM

## 2020-07-03 LAB — CBC WITH DIFF
BASOPHIL #: 0 10*3/uL (ref 0.00–0.10)
BASOPHIL %: 0 % (ref 0–3)
EOSINOPHIL #: 0 10*3/uL (ref 0.00–0.50)
EOSINOPHIL %: 0 % (ref 0–5)
HCT: 36.1 % (ref 36.0–45.0)
HGB: 12.5 g/dL (ref 12.0–15.5)
LYMPHOCYTE #: 0.7 10*3/uL — ABNORMAL LOW (ref 1.00–4.80)
LYMPHOCYTE %: 6 % — ABNORMAL LOW (ref 15–43)
MCH: 28.9 pg (ref 27.5–33.2)
MCHC: 34.5 g/dL (ref 32.0–36.0)
MCV: 83.7 fL (ref 82.0–97.0)
MONOCYTE #: 0.6 10*3/uL (ref 0.20–0.90)
MONOCYTE %: 5 % (ref 5–12)
MPV: 9.6 fL (ref 7.4–10.5)
NEUTROPHIL #: 10.7 10*3/uL — ABNORMAL HIGH (ref 1.50–6.50)
NEUTROPHIL %: 89 % — ABNORMAL HIGH (ref 43–76)
PLATELETS: 180 10*3/uL (ref 150–450)
RBC: 4.32 10*6/uL (ref 4.00–5.10)
RDW: 14.5 % (ref 11.0–16.0)
WBC: 12.1 10*3/uL — ABNORMAL HIGH (ref 4.0–11.0)

## 2020-07-03 LAB — ECG 12-LEAD
Atrial Rate: 50 {beats}/min
Calculated P Axis: 103 degrees
Calculated R Axis: -6 degrees
Calculated T Axis: 22 degrees
PR Interval: 156 ms
QRS Duration: 82 ms
QT Interval: 434 ms
QTC Calculation: 395 ms
Ventricular rate: 50 {beats}/min

## 2020-07-03 LAB — BASIC METABOLIC PANEL
ANION GAP: 7 mmol/L (ref 4–13)
BUN/CREA RATIO: 23 — ABNORMAL HIGH (ref 6–22)
BUN: 17 mg/dL (ref 8–25)
CALCIUM: 8.8 mg/dL (ref 8.8–10.2)
CHLORIDE: 107 mmol/L (ref 96–111)
CO2 TOTAL: 25 mmol/L (ref 23–31)
CREATININE: 0.74 mg/dL (ref 0.60–1.05)
ESTIMATED GFR: >90 mL/min/BSA (ref >=60)
GLUCOSE: 142 mg/dL — ABNORMAL HIGH (ref 65–125)
POTASSIUM: 3.5 mmol/L (ref 3.5–5.1)
SODIUM: 139 mmol/L (ref 136–145)

## 2020-07-03 LAB — POC FINGERSTICK GLUCOSE - BMC/JMC (RESULTS)
GLUCOSE, POC: 134 mg/dL — ABNORMAL HIGH (ref 60–100)
GLUCOSE, POC: 132 mg/dL — ABNORMAL HIGH (ref 60–100)
GLUCOSE, POC: 140 mg/dL — ABNORMAL HIGH (ref 60–100)
GLUCOSE, POC: 133 mg/dL — ABNORMAL HIGH (ref 60–100)

## 2020-07-03 MED ORDER — LISINOPRIL 5 MG TABLET
5.0000 mg | ORAL_TABLET | Freq: Every day | ORAL | Status: DC
Start: 2020-07-03 — End: 2020-07-06
  Administered 2020-07-03 – 2020-07-06 (×4): 5 mg via ORAL
  Filled 2020-07-03 (×4): qty 1

## 2020-07-03 MED ORDER — TRAMADOL 50 MG TABLET
50.0000 mg | ORAL_TABLET | Freq: Four times a day (QID) | ORAL | Status: DC | PRN
Start: 2020-07-03 — End: 2020-07-08

## 2020-07-03 NOTE — Anesthesia Postprocedure Evaluation (Signed)
Anesthesia Post Op Evaluation    Patient: Carrie Escobar  Procedure(s):  Left temporal Stealth guided burr hole and tumor biopsy    Last Vitals:Temperature: 36.2 C (97.2 F) (07/03/20 0736)  Heart Rate: 50 (07/03/20 0736)  BP (Non-Invasive): 138/62 (07/03/20 0736)  Respiratory Rate: 18 (07/03/20 0736)  SpO2: 100 % (07/03/20 4287)    No complications documented.    Patient is sufficiently recovered from the effects of anesthesia to participate in the evaluation and has returned to their pre-procedure level.  Patient location during evaluation: PACU       Patient participation: complete - patient participated  Level of consciousness: awake and alert  Multimodal Pain Management: Multimodal analgesia used between 6 hours prior to anesthesia start to PACU discharge  Pain score: 1  Pain management: adequate  Airway patency: patent    Anesthetic complications: no  Cardiovascular status: acceptable  Respiratory status: acceptable  Hydration status: acceptable  Patient post-procedure temperature: Pt Normothermic   PONV Status: Absent

## 2020-07-03 NOTE — Consults (Signed)
[address]                                                                                                                                   [place], [place]  Consultation Note    Date/Time: 07/03/2020 14:44  Patient Name: Carrie Escobar  MRN#: [id number]  DOB: 05-29-1946  Admission Date: 06/27/2020   Consulting Physician: Gillis Santa, MD  Consulting Cardiologist: Donny Pique MD    Reason for Consult:   The patient was seen at the request of Gillis Santa, MD for the evaluation of bradycardia    History:   Carrie Escobar is a 74 y.o. 35 American female who presents with nighttime bradycardia in the setting of intracranial masses, hypertension, diabetes  She has a family history for heart disease in her sister.    She reports no dizziness or lightheadedness or near syncope or palpitations.     Today she  is feeling well from a cardiac standpoint with no c/o any CP, SOB, palpitations, syncope/presyncope, orthopnea/PND, LE edema, or claudication.  HPI    Cardiac Risk Factors:  hypertension, Diabetes managed by  Insulin    Review of Systems:     ROS    A review of systems was asked and all came back noncontributory except what is mentioned in the history of presenting illness.      Constitutional: denies anorexia, denies fatigue, denies fevers, denies chills, night sweats, denies weight loss or weight gain.  Eyes: denies blurry vision, double vision, or eye pain.  ENT: denies difficulty swallowing, epistaxis, nasal discharge, oral lesions, tinnitus, or vocal changes.  Cardiovascular: denies chest pain, chest pressure/discomfort, irregular heartbeat or palpitations, denies syncope or near  syncope, denies claudication, denies lower extremity edema.  Respiratory: denies cough, denies dyspnea on exertion, denies orthopnea, denies emphysema, pleurisy/chest pain, sputum, or wheezing.  Musculoskeletal: denies arthralgias, denies generalized muscle aches.   Neurological: denies dizziness, denies headache, denies gait problems, headaches, memory problems, speech problems, tremors, vertigo, or weakness.   Endocrine: denies hot flashes, mood swings, skin changes, temperature intolerance, or unexpected weight changes, denies change in appetite, denies sweating.   Hematologic/Lymphatic: denies bleeding problems, denies easy bruising.   Allergic/Immunological: denies hives, insect bite sensitivity, or nasal congestion.  Dermatological: denies acne, eczema, lumps, rash, or skin lesion changes.  Genitourinary: denies any urinary urgency, denies incontinence, denies blood in urine.   Psychiatric: denies anxiety, denies depression.    Past Medical History:     Past Medical History:   Diagnosis Date    Acute metabolic encephalopathy 18/56/3149    Brain mass 06/29/2020    MRI: Multiple enhancing masses in the left parietal, temporal, and occipital lobes which are connected by masslike T2 FLAIR signal abnormality.  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  Largest mass extends along the ependymal lining of left lateral ventricle.    Breast mass, right     benign    Cancer (CMS HCC)     Cataract     bilat    Diabetes mellitus, type 2 (CMS [place])     Family history of colon cancer     Heartburn     HTN (hypertension)     Memory impairment 06/30/2020    Nausea with vomiting     sister with PONV    Obesity     Pulmonary emboli     only once in 1999 after her hysterectomy    Thyroid nodule     left     Wears glasses     reading           Past Surgical History:     Past Surgical History:   Procedure Laterality Date    20550 - INJ SINGLE TENDON SHEATH/LIGAMENT, APONEUROSIS W/ [place] INTERP ONLY (AMB ONLY)  02/10/2016     COLONOSCOPY  2016    HX BREAST BIOPSY Right     BENIGN     HX CATARACT REMOVAL  10/2013    left    HX HYSTERECTOMY  1999    fibroid    HX ROTATOR CUFF REPAIR  11/04/2013    right shoulder           Allergies:     Allergies   Allergen Reactions    Amoxicillin Rash       Medications:     Current Facility-Administered Medications   Medication Dose Route Frequency    [Held by provider] amLODIPine (NORVASC) tablet  10 mg Oral Daily    dexamethasone 4 mg/mL injection  4 mg Intravenous Q6H    fluticasone (FLONASE) 50 mcg per spray nasal spray  1 Spray Each Nostril Daily    levETIRAcetam (KEPPRA) tablet  1,000 mg Oral 2x/day    lisinopril (PRINIVIL) tablet  5 mg Oral Daily    LORazepam (ATIVAN) 2 mg/mL injection  2 mg Intravenous Q4H PRN    metFORMIN (GLUCOPHAGE XR) extended release tablet  500 mg Oral Daily    NS 250 mL flush bag   Intravenous Q1H PRN    NS flush syringe  10 mL Intravenous Q8HRS    NS flush syringe  10 mL Intravenous Q1H PRN    SSIP insulin lispro (HUMALOG) 100 units/mL SubQ pen  1-12 Units Subcutaneous 4x/day AC    [Held by provider] timolol (TIMOPTIC) 0.25% ophthalmic solution  1 Drop Right Eye 2x/day    traMADol (ULTRAM) tablet  50 mg Oral Q6H PRN     Prior to Admission medications    Medication Sig Start Date End Date Taking? Authorizing Provider   amLODIPine (NORVASC) 10 mg Oral Tablet TAKE 1 TABLET BY MOUTH EVERY DAY 05/27/20  Yes Kayleen Memos, MD   aspirin (ECOTRIN) 81 mg Oral Tablet, Delayed Release (E.C.) Take 81 mg by mouth Once a day   Yes Provider, Historical   bisoprolol-hydroCHLOROthiazide (ZIAC) 10-6.25 mg Oral Tablet TAKE 1 TABLET BY MOUTH EVERY MORNING 05/09/20  Yes Kayleen Memos, MD   calcium citrate-vitamin D3 (CITRACAL) 200 mg calcium -250 unit Oral Tablet Take 1 Tab by mouth Once a day   Yes Provider, Historical   Flaxseed Oil 1,000 mg Oral Capsule Take by mouth   Yes Provider, Historical   fluticasone (FLONASE) 50 mcg/actuation Nasal Spray, Suspension 1 Spray by Each Nostril route  Twice per day as needed 09/21/17  Yes Kayleen Memos, MD   metFORMIN (GLUCOPHAGE XR) 500 mg Oral Tablet Sustained Release 24 hr Take 1 Tablet (500 mg total) by mouth Once a day 03/03/20  Yes Kayleen Memos, MD   multivitamin Oral Tablet Take 1 Tab by mouth Once a day   Yes Provider, Historical   timolol maleate (TIMOPTIC) 0.25 % Ophthalmic Drops Instill 1 Drop into both eyes Twice daily     Yes Provider, Historical   TURMERIC ORAL Take by mouth  06/23/20  Provider, Historical       Family History:     Family Medical History:       Problem Relation (Age of Onset)    Breast Cancer Maternal Grandmother    Cancer Mother, Sister    Heart Attack Sister    Hypertension (High Blood Pressure) Mother    No Known Problems Brother, Maternal Grandfather, Paternal Grandmother, Paternal Grandfather, Daughter, Son, Maternal Aunt, Maternal Uncle, Paternal Aunt, Paternal Uncle, Other    Stroke Mother, Father                Social History:     Social History     Socioeconomic History    Marital status: Divorced     Spouse name: Not on file    Number of children: 0    Years of education: Not on file    Highest education level: Not on file   Occupational History     Employer: [employer]   Tobacco Use    Smoking status: Never Smoker    Smokeless tobacco: Never Used   Substance and Sexual Activity    Alcohol use: No     Alcohol/week: 0.0 standard drinks    Drug use: No    Sexual activity: Not on file   Other Topics Concern    Abuse/Domestic Violence No    Breast Self Exam Not Asked    Caffeine Concern Not Asked    Calcium intake adequate Not Asked    Computer Use Not Asked    Drives Yes    Exercise Concern Not Asked    Helmet Use Not Asked    Seat Belt Not Asked    Special Diet No    Sunscreen used Not Asked    Uses [employer]  No    Uses walker No    Uses wheelchair No    Right hand dominant Yes    Left hand dominant No    Ambidextrous Not Asked    Shift Work Not Asked    Unusual Sleep-Wake Schedule Not Asked    Ability to Walk 1 Flight of Steps  without SOB/CP Yes    Routine Exercise Yes     Comment: Zumba class    Ability to Walk 2 Flight of Steps without SOB/CP Yes    Unable to Ambulate Not Asked    Total Care Not Asked    Ability To Do Own ADL's Yes    Uses Walker Not Asked    Other Activity Level Not Asked    Uses Cane Not Asked   Social History Narrative    Not on file     Social Determinants of Health     Financial Resource Strain:     Difficulty of Paying Living Expenses:    Food Insecurity:     Worried About Charity fundraiser in the Last Year:     Ran Out of Food in the Last Year:    Transportation Needs:     Film/video editor (Medical):     Lack of Transportation (Non-Medical):    Physical Activity:     Days of Exercise per Week:     Minutes of Exercise per Session:    Stress:     Feeling of Stress :    Intimate Partner Violence:     Fear of Current or Ex-Partner:     Emotionally Abused:     Physically Abused:     Sexually Abused:        Physical Exam:     Vitals:    07/03/20 0736 07/03/20 1011 07/03/20 1157 07/03/20 1409   BP: 138/62 (!) 161/64 (!) 156/57 (!) 148/71   Pulse: 50 45 44 58   Resp: 18 18 18 18    Temp: 36.2 C (97.2 F) 36.5 C (97.7 F) 36.9 C (98.5 F) 36.7 C (98.1 F)   SpO2: 100% 96% 98% 99%   Weight:       Height:       BMI:             Body mass index is 26.38 kg/m.    Intake and Output Summary (Last 24 hours) at Date Time  No intake or output data in the 24 hours ending 07/03/20 1444    Physical Exam    Constitutional: No acute distress, well appearing and well nourished. Appears alert, appears stated age and cooperative. Moderately overweight.  Eyes: Pupils equal and reactive, extra ocular eye movements intact, left eye normal, right eye normal.  Head: Normocephalic, without obvious abnormality, atraumatic  Neck: Supple, symmetric, trachea midline, no masses. The thyroid appears normal, no thyromegaly. There is  no jugular venous distention. There are no carotid bruits.   Respiratory: Lungs are clear to auscultation.  No wheezing, rales or rhonchi.  Cardiovascular: Non-displaced PMI. regular rhythm, normal rate. S1, S2 are normal. No murmurs appreciated, no click, rub or gallops noted.  Extremities: 2+ carotid, radial, dorsalis pedis bilaterally with no evidence of any abdominal bruits. No cyanosis or clubbing. No lower extremity edema.   Gastrointestinal: Normal bowel sounds. Non-tender, no masses. No hepatomegaly or splenomegaly.  Musculoskeletal: No joint tenderness, deformity or swelling.  Skin: Normal coloration and turgor, no rashes, no suspicious skin lesions noted.   Neurologic: No focal deficits, alert  and oriented x3, normal strength and tone. Normal coordination and gait.  Genitourinary: Was not performed as patient deferred.   Lymphatic: No palpable lymphadenopathy.   Psychiatric: Appears in good mood, fluent in speech and cognition  Labs Reviewed:   BMP:   Recent Labs     06/30/20  0506 07/02/20  0558 07/03/20  0424   SODIUM 143 141 139   POTASSIUM 3.6 3.5 3.5   CO2 26 23 25    BUN 18 17 17    CREATININE 0.84 0.78 0.74     Magnesium: No results found for this encounter  CBC:   Recent Labs     06/30/20  0506 07/02/20  0558 07/03/20  0424   WBC 22.2* 13.0* 12.1*   HGB 13.0 13.6 12.5   HCT 38.4 39.7 36.1   PLTCNT 218 192 180     Hepatic Function: No results found for this encounter  Coags:   Recent Labs     06/29/20  1750   INR 1.29   APTT 25.0*     Cardiac Markers: No results found for this encounter  TSH:  No results found for this encounter  Lipids:   Recent Results (from the past 14000 hour(s))   LIPID PANEL    Collection Time: 01/17/20 12:00 AM   Result Value    TRIGLYCERIDES 101    CHOLESTEROL 179    HDL-CHOLESTEROL 73    LDL (CALCULATED) 86   LIPID PANEL    Collection Time: 08/29/19 10:31 AM   Result Value    CHOLESTEROL 183    HDL CHOL 71    TRIGLYCERIDES 91    LDL CALC 94    VLDL CALC 18    CHOL/HDL RATIO 2.6         Personally reviewed by Lamona Curl MSN, APRN, FNP-BC  Rads:     Results for orders placed or  performed during the hospital encounter of 06/27/20 (from the past 69 hour(s))   CT BRAIN STEALTH W CONTRAST     Status: None    Narrative    [name]  Female, 74 years old.    CT BRAIN STEALTH W CONTRAST performed on 07/01/2020 9:30 AM.    REASON FOR EXAM:  Pre-op    RADIATION DOSE: 1189.4 DLP (mGycm)    CONTRAST: 84 ml's of Isovue 370    COMPARISON: MRI brain 06/28/2020.    FINDINGS:     Redemonstration of large mass centered in the left temporoparietal region.  There is also a smaller focus of vasogenic edema in the left occipital  region. Findings are consistent known multifocal disease. There is  associated mass effect with partial effacement of the left lateral  ventricle and 4 mm left-to-right midline shift, similar to prior study. The  calvarium is intact. The sinuses and mastoids are clear.      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          Radiologist location ID: [id number]     CT BRAIN WO IV CONTRAST     Status: None    Narrative    [name]  Female, 74 years old.    CT BRAIN WO IV CONTRAST performed on 07/02/2020 1:22 PM.    REASON FOR EXAM:  post-op    RADIATION DOSE: 701.30 mGy.cm    COMPARISON: MRI brain 06/27/2020.    FINDINGS:  There are acute postsurgical changes related to biopsy of left temporal  lobe neoplasm. Minimal gas noted within the biopsy site. No change in  extensive vasogenic edema predominantly involving the left temporal lobe.  There is unchanged mass effect with approximately 3 mm right-to-left  midline shift. No discrete parenchymal hemorrhage or evidence of acute  territorial infarction.      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      Radiologist location ID: [id number]     CT BRAIN WO IV CONTRAST     Status: None    Narrative    [name]  Female, 74 years old.    CT BRAIN WO IV CONTRAST performed on  07/03/2020 1:48 PM.    REASON FOR EXAM:  AMS, s/p brain biopsy    RADIATION DOSE: 1141.80 mGycm    TECHNIQUE: Noncontrast MRI of the brain.    COMPARISON: July 02, 2020.    FINDINGS:  There is redemonstration of postoperative changes from left  temporal lobe mass is seen. Small at the biopsy site is unchanged. The  multifocal masses involving the left parietal, temporal and occipital lobes  with connecting white matter lucency are unchanged in appearance given the  differences in technique. There is unchanged surrounding edema. Localized  mass effect and mild dilation of the temporal horn of the left lateral  ventricle is unchanged. No new hemorrhage, gray-white loss or mass effect  is identified.      Impression    Stable appearance of a left hemisphere mass status post biopsy. No new  hemorrhage or gray-white loss is identified. There is unchanged mass effect  associated with the tumor.      Radiologist location ID: [id number]         Personally reviewed by Lamona Curl MSN, APRN, FNP-BC  Cardiovascular Workup:   EKG 07-03-20  Sinus bradycardia  Minimal voltage criteria for LVH, may be normal variant  Possible Anterior infarct , age undetermined  Abnormal ECG    Echo 2018  Conclusions:  1. Normal left ventricular size and function. Moderate eccentric posterolateral mitral insufficiency present  (this could be underestimated on a surface echocardiogram).  2. Normal pulmonary artery pressure.    Personally reviewed by Lamona Curl MSN, APRN, FNP-BC  Assessment/Plan:   Carrie Escobar is a 74 y.o. 54 American female who presents with nighttime bradycardia in the setting of intracranial masses, hypertension, diabetes    Bradycardia, asymptomatic  - sinus bradycardia in 40-50s  - usually at night, could be secondary to increased vagal discharge at night  - past EKGs show sinus bradycardia in 2016  - avoid AV nodal blocking meds  - 30 day cardiac event monitor when discharged  - Echo, as  outpatient    Follow up with cardiology in 2 weeks        Thank you for allowing [place] to participate in the care of your patient. Please feel free to contact us if further questions arise.      [place] MSN, APRN, FNP-BC  [facility]  Text or Call [phone]  I personally saw and evaluated the patient. See mid-level's note for additional details.     Donny Pique, MD

## 2020-07-03 NOTE — Care Management Notes (Signed)
07/03/20 0815   Patient Hand-Off   Clinical/Discharge Plan of Care Information Communicated to:  Clinical Care Coordinator   Comments Acie Fredrickson, RN/CM

## 2020-07-03 NOTE — OT Treatment (Signed)
OT attempted to see the pt. For treatment this PM, however, she was eating her dinner. Will attempt again later as the pt. Is able.     Karl Bales, OT  07/03/2020, 18:21

## 2020-07-03 NOTE — Progress Notes (Signed)
[facility]  [place], [place] [address]    INPATIENT PROGRESS NOTE      Carrie Escobar, Slee  Date of Admission:  06/27/2020  Date of Birth:  12-10-1945  Date of Service:  07/03/2020         Assessment/ Plan:       Intracranial masses:  Multi focal. Concerning for glioblastoma v Lymphoma v Mets. Associated with weakness, memory impairment and lethargy.   -she seems to be more confused/disoriented today.  Repeat CT of the head ordered, status post biopsy done 07/02/2020.  -CT chest/Abd/Plv only showing a thyroid mass. [place] of thyroid ordered.  - s/p off ASA x 5 days.  Follow-up with Neurosurgery about when to restart aspirin.  - Continue on prophylactic Keppra IV. Continue Decadron. Continue  Neuro checks.  Appreciate recommendation from Neurology, Neurosurgery, and Oncology. Falls, seizure, and aspiration precautions. Supportive care as needed.    Abnormal EEG result: Delta wave noted concerning for seizure potential. Keppra to continue per Dr Reesa Chew. Seizure precautions. Fall precaution. IV ativan for breakthrough seizure. Supportive care.     Memory impairment/Confusion: Subacute. Ongoing for several weeks. Worsening.  Likely due to above diagnoses. Treat as outlined above. Further evaluation diagnosis as outlined above.    Lethargy/acute metabolic encephalopathy: Subacute.  May be emanating from above diagnosis.  Treat as outlined above. PT OT evaluation and treatment.  Fall precautions.    Hypertension, uncontrolled.: Benign.  Continue to hold Norvasc, started on lisinopril 5 milligrams p.o. daily.     Diabetes mellitus: Type 2. Patient on Metformin. Will continue same. Sliding scale insulin coverage as needed. Fingersticks.      Bradycardia  - EKG reviewed by myself, showing NSR, sinus bradycardia, no acute ST/T wave changes.   - continue to hold timolol eye drops and Norvasc.  - continue with cardiac monitoring.  -due to persistent bradycardia, cardiology consultation was  requested-pending.      Prophylaxis:  SCD      Disposition:  Home when workup and treatment are completed.          Chief Complaint:  Intracranial Masses, sleepiness, forgetfulness, fatigue.      Subjective:  Patient seems to be significantly more confused and disoriented today.  No communication possible.  Cardiac monitor continues to show sinus bradycardia.  D/w RN.      12 point ROS have been negative except as mentioned          Today's Physical Exam:  BP (!) 148/71    Pulse 58    Temp 36.7 C (98.1 F)    Resp 18    Ht 1.651 m (5\' 5" )    Wt 71.9 kg (158 lb 8.2 oz)    SpO2 99%    BMI 26.38 kg/m       General: lethargic, No acute distress.   Eyes: Pupils equal and round, reactive to light and accomodation.   HENT:Head atraumatic and normocephalic   Neck: No JVD or thyromegaly or lymphadenopathy   Lungs: Clear to auscultation bilaterally.   Cardiovascular: bradycardia. Regular rhythm, S1, S2 normal, no murmur,   Abdomen: Soft, non-tender, Bowel sounds normal, No hepatosplenomegaly   Extremities: extremities normal, atraumatic, no cyanosis or edema   Skin: Skin warm and dry   Neurologic: lethargic.  Confused/disoriented.  Lymphatics: No lymphadenopathy   Psychiatric: lethargic    Current Medications:  [Held by provider] amLODIPine (NORVASC) tablet, 10 mg, Oral, Daily  dexamethasone 4 mg/mL injection, 4 mg, Intravenous, Q6H  fluticasone (FLONASE) 50 mcg per spray nasal spray, 1 Spray, Each Nostril, Daily  levETIRAcetam (KEPPRA) tablet, 1,000 mg, Oral, 2x/day  LORazepam (ATIVAN) 2 mg/mL injection, 2 mg, Intravenous, Q4H PRN  metFORMIN (GLUCOPHAGE XR) extended release tablet, 500 mg, Oral, Daily  NS 250 mL flush bag, , Intravenous, Q1H PRN  NS flush syringe, 10 mL, Intravenous, Q8HRS  NS flush syringe, 10 mL, Intravenous, Q1H PRN  SSIP insulin lispro (HUMALOG) 100 units/mL SubQ pen, 1-12 Units, Subcutaneous, 4x/day AC  [Held by provider] timolol (TIMOPTIC) 0.25% ophthalmic solution, 1 Drop, Right Eye,  2x/day  traMADol (ULTRAM) tablet, 50 mg, Oral, Q6H PRN          I/O:  I/O last 24 hours:    No intake or output data in the 24 hours ending 07/03/20 1419  I/O current shift:  No intake/output data recorded.      Laboratory Results:    Reviewed:   Results for orders placed or performed during the hospital encounter of 06/27/20 (from the past 24 hour(s))   ECG 12-LEAD   Result Value Ref Range    Ventricular rate 50 BPM    Atrial Rate 50 BPM    PR Interval 156 ms    QRS Duration 82 ms    QT Interval 434 ms    QTC Calculation 395 ms    Calculated P Axis 103 degrees    Calculated R Axis -6 degrees    Calculated T Axis 22 degrees   POC FINGERSTICK GLUCOSE - [facility] (RESULTS)   Result Value Ref Range    GLUCOSE, POC 131 (H) 60 - 100 mg/dl   POC FINGERSTICK GLUCOSE - [facility] (RESULTS)   Result Value Ref Range    GLUCOSE, POC 134 (H) 60 - 100 mg/dl   BASIC METABOLIC PANEL   Result Value Ref Range    SODIUM 139 136 - 145 mmol/L    POTASSIUM 3.5 3.5 - 5.1 mmol/L    CHLORIDE 107 96 - 111 mmol/L    CO2 TOTAL 25 23 - 31 mmol/L    ANION GAP 7 4 - 13 mmol/L    CALCIUM 8.8 8.8 - 10.2 mg/dL    GLUCOSE 142 (H) 65 - 125 mg/dL    BUN 17 8 - 25 mg/dL    CREATININE 0.74 0.60 - 1.05 mg/dL    BUN/CREA RATIO 23 (H) 6 - 22    ESTIMATED GFR >90 >=60 mL/min/BSA   CBC WITH DIFF   Result Value Ref Range    WBC 12.1 (H) 4.0 - 11.0 x103/uL    RBC 4.32 4.00 - 5.10 x106/uL    HGB 12.5 12.0 - 15.5 g/dL    HCT 36.1 36.0 - 45.0 %    MCV 83.7 82.0 - 97.0 fL    MCH 28.9 27.5 - 33.2 pg    MCHC 34.5 32.0 - 36.0 g/dL    RDW 14.5 11.0 - 16.0 %    PLATELETS 180 150 - 450 x103/uL    MPV 9.6 7.4 - 10.5 fL    NEUTROPHIL % 89 (H) 43 - 76 %    LYMPHOCYTE % 6 (L) 15 - 43 %    MONOCYTE % 5 5 - 12 %    EOSINOPHIL % 0 0 - 5 %    BASOPHIL % 0 0 - 3 %    NEUTROPHIL # 10.70 (H) 1.50 - 6.50 x103/uL    LYMPHOCYTE # 0.70 (L) 1.00 - 4.80 x103/uL    MONOCYTE # 0.60 0.20 -  0.90 x103/uL    EOSINOPHIL # 0.00 0.00 - 0.50 x103/uL    BASOPHIL # 0.00 0.00 - 0.10 x103/uL   POC  FINGERSTICK GLUCOSE - [facility] (RESULTS)   Result Value Ref Range    GLUCOSE, POC 134 (H) 60 - 100 mg/dl   POC FINGERSTICK GLUCOSE - [facility] (RESULTS)   Result Value Ref Range    GLUCOSE, POC 132 (H) 60 - 100 mg/dl     Ordered:    Lab orders   No lab orders         Radiological Studies:  Reviewed:   Results for orders placed or performed during the hospital encounter of 06/27/20 (from the past 24 hour(s))   CT BRAIN WO IV CONTRAST     Status: None    Narrative    [name]  Female, 74 years old.    CT BRAIN WO IV CONTRAST performed on 07/03/2020 1:48 PM.    REASON FOR EXAM:  AMS, s/p brain biopsy    RADIATION DOSE: 1141.80 mGycm    TECHNIQUE: Noncontrast MRI of the brain.    COMPARISON: July 02, 2020.    FINDINGS:  There is redemonstration of postoperative changes from left  temporal lobe mass is seen. Small at the biopsy site is unchanged. The  multifocal masses involving the left parietal, temporal and occipital lobes  with connecting white matter lucency are unchanged in appearance given the  differences in technique. There is unchanged surrounding edema. Localized  mass effect and mild dilation of the temporal horn of the left lateral  ventricle is unchanged. No new hemorrhage, gray-white loss or mass effect  is identified.      Impression    Stable appearance of a left hemisphere mass status post biopsy. No new  hemorrhage or gray-white loss is identified. There is unchanged mass effect  associated with the tumor.      Radiologist location ID: [id number]       Ordered:  CT BRAIN WO IV CONTRAST  XR AP MOBILE CHEST  MRI BRAIN W/WO CONTRAST  CT CHEST ABDOMEN PELVIS W IV CONTRAST  MRI BRAIN STEALTH W/WO CONTRAST  US THYROID  CT BRAIN STEALTH W CONTRAST  CT BRAIN WO IV CONTRAST  CT BRAIN WO IV CONTRAST    Problem List:  Active Hospital Problems   (*Primary Problem)    Diagnosis    *Brain mass    Memory impairment    Acute metabolic encephalopathy         Gillis Santa, MD, 07/03/2020,  14:19

## 2020-07-03 NOTE — Nurses Notes (Signed)
Consult called to Dr, Marlon Pel for Bradycardia

## 2020-07-03 NOTE — PT Treatment (Signed)
[facility]  Physical Therapy Progress Note      Subjective: "I'm okay, doing okay."    Numeric pain scale: Would not rate pain, just kept saying "I am okay."     Areas of gait training assessed:  (9 minutes of gait training)      Ambulation surface:  Level and In patient's room     Ambulation distance:  10' x2     Ambulation ability:  Minimum assist and 1 person     Assistive devices:  2ww     Stair climbing:  DNT     Ambulation tolerance: Fair     Comments: Patient with notable decrease in gait safety compared to last PT session, she made contact with wall/obstacles in her room x4 and needed frequent Min Ax1 to reposition walker. She is currently a very high fall risk.     Areas of functional mobility addressed: (6 minutes)     Assistive devices used with bed mobility:  Bed rail     Rolling:  Independent     Supine - Sit:  Standby assist     Sit - Supine:  Standby assist     Sit - stand:  Contact guard assist     Bed - Chair/ Chair - bed: DNT     Car transfers: DNT     Balance while sitting:  Able to maintain position     Balance while standing: Partial physical support     Comments:  Needed substantial cues for sequencing     Bed alarm reset:  Yes    Assessment:  This patient would continue to benefit from acute physical therapy services to address: deficits in functional mobility skills, functional transfer independence, bed mobility skills, functional strength, balance, and functional activity tolerance compared to baseline/previous level of function. She is making steady but slow progress towards STGs and LTGs but her presentation can be very complex with variable levels of complications. PT expects patient to have "good" and "bad" days. She remains pleasantly confused and very impulsive. She has difficulty following commands when they are relayed to her from her R side or if they are more than one step commands. Her coordination appears slightly worse than last PT session. Her strength is not  worse but is not better. She would benefit from continued PT to increase her safety with AD use. She will likely need a SNF for continued rehab. Patient positioned comfortably in bed at end of session with no acute distress or concerns voiced, all immediate needs met/addressed, and call button within reach. Alarm active. She reports she will stay in bed unless assisted. Understands call button.     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 No new hemorrhage. Unchanged mass effect associated with the tumor." Neurosurgery note states: "Patient does not have any restrictions and is able to get out of bed."       Enedina Finner, PT

## 2020-07-03 NOTE — PT Treatment (Signed)
PT attempted to see patient for PT tx session today but patient off the floor at CT scan. PT to re-attempt as schedule allows.     Enedina Finner, PT

## 2020-07-03 NOTE — Care Management Notes (Signed)
SW conversed with Mr. Shipp, brother, regarding the patient's AD. The document provided to Care Management is incomplete as there is no Notary Page. Mr. Rath shared that there may be another page that is missing and that when he returns home, he will review the document and follow back up with this SW. It was agreed that SW would send him an e-mail ([email or web address]) so he could follow up if he locates the document. Mr. Siebel did agree to be the [facility] for the patient if the document could not be located. Mr. Roberg gave verbal consent by phone for a HCS to be completed.

## 2020-07-03 NOTE — Care Management Notes (Signed)
SW has scanned a copy of the patient's HCS into Epic. All screens have been updated.    A Pre Admission Screen (PAS) has been started and saved.     A referral for SNF placement has been sent within a 23 mile radius of [id number] ([place] Only). Clinical sent to a total of 10 facilities however, three have been under COVID quarantine and the remaining 7 have been very full

## 2020-07-03 NOTE — Addendum Note (Signed)
Addendum  created 07/03/20 0910 by Ahmed Prima, MD    Clinical Note Signed

## 2020-07-03 NOTE — Nurses Notes (Signed)
Assumed care approximately 0730. Pt disoriented x4 - able to tell last name but not first name. Pt presenting with aphasia. Calm and cooperative. Ambulated max X1 assist. Bed and chair alarms set. Will continue to monitor. Q2 neuros and vitals maintained.

## 2020-07-03 NOTE — Anesthesia Postprocedure Evaluation (Signed)
Anesthesia Post Op Evaluation    Patient: Carrie Escobar  Procedure(s):  Left temporal Stealth guided burr hole and tumor biopsy    Last Vitals:Temperature: 36.2 C (97.2 F) (07/03/20 0736)  Heart Rate: 50 (07/03/20 0736)  BP (Non-Invasive): 138/62 (07/03/20 0736)  Respiratory Rate: 18 (07/03/20 0736)  SpO2: 100 % (07/03/20 2952)    No complications documented.    Patient is sufficiently recovered from the effects of anesthesia to participate in the evaluation and has returned to their pre-procedure level.  Patient location during evaluation: PACU       Patient participation: complete - patient participated  Level of consciousness: awake and alert  Multimodal Pain Management: Multimodal analgesia used between 6 hours prior to anesthesia start to PACU discharge  Pain score: 2  Pain management: adequate  Airway patency: patent    Anesthetic complications: no  Cardiovascular status: acceptable  Respiratory status: acceptable  Hydration status: acceptable  Patient post-procedure temperature: Pt Normothermic   PONV Status: Absent

## 2020-07-03 NOTE — Progress Notes (Signed)
[facility]  NEUROSURGERY   PROGRESS NOTE      Carrie Escobar, Carrie Escobar, 74 y.o. female  Date of Admission:  06/27/2020  Date of Service: 07/03/2020  Date of Birth:  November 10, 1945    Referring Physician:  No ref. 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 Patient is doing well post-operatively. Pain over the incisional site but she states the pain is manageable. She rates it as a 3-4/10. Otherwise doing well. Denies nausea, vomiting, visual changes, or dizziness.     Vital Signs:  Temp (24hrs) Max:36.8 C (94.1 F)      Systolic (74YCX), KGY:185 , Min:130 , UDJ:497     Diastolic (02OVZ), CHY:85, Min:56, Max:86    Temp  Avg: 36.4 C (97.5 F)  Min: 36 C (96.8 F)  Max: 36.8 C (98.2 F)  MAP (Non-Invasive)  Avg: 87.8 mmHG  Min: 78 mmHG  Max: 105 mmHG  Pulse  Avg: 63.5  Min: 36  Max: 102  Resp  Avg: 16  Min: 12  Max: 18  SpO2  Avg: 97.8 %  Min: 95 %  Max: 100 %       Min/Max/Avg ICP/CPP last 24hrs:   No data recorded    Today's Physical Exam:  Constitutional: no distress  Eyes: Conjunctiva clear.  ENT: ENMT without erythema or injection, mucous membranes moist.  Neck: supple, symmetrical, trachea midline  Respiratory: Clear to auscultation bilaterally.   Cardiovascular: regular rate and rhythm  Gastrointestinal: Soft, non-tender  Musculoskeletal: 5/5 in both upper and lower extremities  Integumentary:  Skin warm and dry  Neurologic: CN II - XII grossly intact . A/O x 1.   Lymphatic/Immunologic/Hematologic: No lymphadenopathy  Psychiatric: Normal  Incision over left temporal region with staples intact and dressing over the wound.      Current Medications:  [Held by provider] amLODIPine (NORVASC)  tablet, 10 mg, Oral, Daily  ceFAZolin (ANCEF) 1 g in iso-osmotic 50 mL premix IVPB, 1 g, Intravenous, Q8H  dexamethasone 4 mg/mL injection, 4 mg, Intravenous, Q6H  fluticasone (FLONASE) 50 mcg per spray nasal spray, 1 Spray, Each Nostril, Daily  HYDROcodone-acetaminophen (NORCO) 5-325 mg per tablet, 1 Tablet, Oral, Q4H PRN  levETIRAcetam (KEPPRA) tablet, 1,000 mg, Oral, 2x/day  LORazepam (ATIVAN) 2 mg/mL injection, 2 mg, Intravenous, Q4H PRN  metFORMIN (GLUCOPHAGE XR) extended release tablet, 500 mg, Oral, Daily  morphine 4 mg/mL injection, 2 mg, Intravenous, Q4H PRN  NS 250 mL flush bag, , Intravenous, Q1H PRN  NS flush syringe, 10 mL, Intravenous, Q8HRS  NS flush syringe, 10 mL, Intravenous, Q1H PRN  SSIP insulin lispro (HUMALOG) 100 units/mL SubQ pen, 1-12 Units, Subcutaneous, 4x/day AC  [Held by provider] timolol (TIMOPTIC) 0.25% ophthalmic solution, 1 Drop, Right Eye, 2x/day        I/O:  I/O last 24 hours:    Intake/Output Summary (Last 24 hours) at 07/03/2020 0721  Last data filed at 07/02/2020 1258  Gross per 24 hour   Intake 700 ml   Output 230 ml   Net 470 ml     I/O current shift:  No intake/output data recorded.    Antibiotics: Date Started Date Completed  1.     2.     3.     4.       Nutrition/Residuals:  DIET DIABETIC/ADA Ensure High Protein; Ensure HP once daily with lunch    Labs  Please indicate ordered or reviewed)  Reviewed:   Lab Results for Last 24 Hours:    Results for orders placed or performed during the hospital encounter of 06/27/20 (from the past 24 hour(s))   POC FINGERSTICK GLUCOSE - [facility] (RESULTS)   Result Value Ref Range    GLUCOSE, POC 131 (H) 60 - 100 mg/dl   POC FINGERSTICK GLUCOSE - [facility] (RESULTS)   Result Value Ref Range    GLUCOSE, POC 134 (H) 60 - 100 mg/dl   BASIC METABOLIC PANEL   Result Value Ref Range    SODIUM 139 136 - 145 mmol/L    POTASSIUM 3.5 3.5 - 5.1 mmol/L    CHLORIDE 107 96 - 111 mmol/L    CO2 TOTAL 25 23 - 31 mmol/L    ANION GAP 7 4 - 13 mmol/L    CALCIUM 8.8  8.8 - 10.2 mg/dL    GLUCOSE 142 (H) 65 - 125 mg/dL    BUN 17 8 - 25 mg/dL    CREATININE 0.74 0.60 - 1.05 mg/dL    BUN/CREA RATIO 23 (H) 6 - 22    ESTIMATED GFR >90 >=60 mL/min/BSA   CBC WITH DIFF   Result Value Ref Range    WBC 12.1 (H) 4.0 - 11.0 x103/uL    RBC 4.32 4.00 - 5.10 x106/uL    HGB 12.5 12.0 - 15.5 g/dL    HCT 36.1 36.0 - 45.0 %    MCV 83.7 82.0 - 97.0 fL    MCH 28.9 27.5 - 33.2 pg    MCHC 34.5 32.0 - 36.0 g/dL    RDW 14.5 11.0 - 16.0 %    PLATELETS 180 150 - 450 x103/uL    MPV 9.6 7.4 - 10.5 fL    NEUTROPHIL % 89 (H) 43 - 76 %    LYMPHOCYTE % 6 (L) 15 - 43 %    MONOCYTE % 5 5 - 12 %    EOSINOPHIL % 0 0 - 5 %    BASOPHIL % 0 0 - 3 %    NEUTROPHIL # 10.70 (H) 1.50 - 6.50 x103/uL    LYMPHOCYTE # 0.70 (L) 1.00 - 4.80 x103/uL    MONOCYTE # 0.60 0.20 - 0.90 x103/uL    EOSINOPHIL # 0.00 0.00 - 0.50 x103/uL    BASOPHIL # 0.00 0.00 - 0.10 x103/uL     Ordered:  None    Diagnostic Tests (Please indicate ordered or reviewed)  Reviewed: None  Ordered:  None    Radiology Tests (Please indicate ordered or reviewed)  Reviewed: N/A  Ordered:  None    Assessment/ Plan:  Active Hospital Problems   (*Primary Problem)    Diagnosis    *Brain mass    Memory impairment    Acute metabolic encephalopathy       DVT/PE Prophylaxis: SCDs/ Venodynes/Impulse boots  GI Prophylaxis: Not indicated    Patient/ Family Discussion: Carrie Escobar is a 74 y.o., right handed 56 American female who is now POD #1 s/p left temporal stealth guided burr hole and 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 She is doing well post-operatively. Pain over the incisional site, but is manageable. On exam she has full strength and sensation throughout. Stable examination no  change from yesterday. However, she was transferred to the tele floor for bradycardia and one run of SVT.     Patient will likely be able to be discharged this weekend. Will likely need to be transferred to a facility as she has had a decline in independence  and has confusion. She will also need a steroid taper on discharge. This will be Dexamethasone 4mg  q6h x 5 days, 4mg  q8h x 5 days, 2mg  q8h x 5 days, 2mg  q12h x 5 days, then 2mg  qday x 5 days. We will also need to continue the Keppra 1000 mg BID. She will follow up with neurosurgery outpatient for her 2 week post-op appt for staple removal.     I personally saw and evaluated the patient. See mid-level's note for additional details.     Particia Jasper, MD

## 2020-07-03 NOTE — Care Plan (Signed)
Assessment:  This patient would continue to benefit from acute physical therapy services to address: deficits in functional mobility skills, functional transfer independence, bed mobility skills, functional strength, balance, and functional activity tolerance compared to baseline/previous level of function. She is making steady but slow progress towards STGs and LTGs but her presentation can be very complex with variable levels of complications. PT expects patient to have "good" and "bad" days. She remains pleasantly confused and very impulsive. She has difficulty following commands when they are relayed to her from her R side or if they are more than one step commands. Her coordination appears slightly worse than last PT session. Her strength is not worse but is not better. She would benefit from continued PT to increase her safety with AD use. She will likely need a SNF for continued rehab. Patient positioned comfortably in bed at end of session with no acute distress or concerns voiced, all immediate needs met/addressed, and call button within reach. Alarm active. She reports she will stay in bed unless assisted. Understands call button.      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 No new hemorrhage. Unchanged mass effect associated with the tumor." Neurosurgery note states: "Patient does not have any restrictions and is able to get out of bed."        Enedina Finner, PT

## 2020-07-03 NOTE — Care Plan (Signed)
Pt moved to room cardiac telemetry room 603 when developed bradycardia postoperatively.  Medication adjustments made.  CM responsibilities assumed, EMR reviewed.  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 Ebony Hail.  Pt having memory impairment, weakness, & lethargy.    Pre-operative PT/OT evaluations recommended SNF placement at [place].  Pt was requiring max verbal cues & directions.  Pt demonstrated a decline in independence in daily activities & functional mobility.   Documentation indicates PT/OT will reassess pt today.      On 06/29/20, CM [place] chat to Dr. Manuela Schwartz indicates speaking with MPOA [name] who was agreeable to a PAS & statewide referral.  He declined [facility] & [facility] at the time.  CM is sending a secure chat to Dr. Manuela Schwartz & SW Mitzi Hansen regarding consult for SNF placement.

## 2020-07-03 NOTE — Care Management Notes (Addendum)
07/03/20 0856   Assessment Detail   Assessment Type Continued Assessment   Date of Care Management Update 07/03/20   Medicare Intent to Discharge Documentation   Admit IMM given to: Patient   Admit IMM letter given date 06/27/20   Admit IMM letter time given 1905   Discharge IMM give to: Patient   IMM explained/reviewed with:  Patient  (by registration)   Social Work Plan   Discharge Planning Status plan in progress   Projected Discharge Date   (undetermined)   Anticipated Discharge Disposition   (undetermined)   CM will evaluate for rehabilitation potential yes   Plan PT/OT evaluations   Discharge Needs Assessment   Concerns To Be Addressed adjustment to diagnosis/illness concerns;care coordination/care conferences   Concerns Comments awaiting pathology reports   Taking blood thinner/anticoagulant therapy at home? not on anticoagulant therapy   [place] Type: MPOA   Name Carrie Escobar   Phone Number [phone]     Pt moved to room cardiac telemetry room 603 when developed bradycardia postoperatively.  Medication adjustments made.  CM responsibilities assumed, EMR reviewed.  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 Ebony Hail.  Pt having memory impairment, weakness, & lethargy.    Pre-operative PT/OT evaluations recommended SNF placement at [place].  Pt was requiring max verbal cues & directions.  Pt demonstrated a decline in independence in daily activities & functional mobility.   Documentation indicates PT/OT will reassess pt today.      On 06/29/20, CM [place] chat to Dr. Manuela Schwartz indicates speaking with MPOA [name] who was agreeable to a PAS & statewide referral.  He declined MD & VA at the time.  CM is sending a secure chat to Dr. Manuela Schwartz & SW Mitzi Hansen regarding consult for SNF placement.      Update:    07/03/20 1127  IP CONSULT TO CARE MANAGEMENT ([facility] - DO NOT USE FOR [facility] PATIENTS)  ONE  TIME     Complete  Discontinue     Process Instructions: If requesting assistance with completion of Advance Directives, please provide patient with the packet.   References:ON CALL ([facility])   Provider: (Not yet assigned)   Question: Indication For Consult: Answer: D/C NEEDS - PLACEMENT REHAB/SNF

## 2020-07-04 LAB — BASIC METABOLIC PANEL
ANION GAP: 9 mmol/L (ref 4–13)
BUN/CREA RATIO: 24 — ABNORMAL HIGH (ref 6–22)
BUN: 19 mg/dL (ref 8–25)
CALCIUM: 9 mg/dL (ref 8.8–10.2)
CHLORIDE: 106 mmol/L (ref 96–111)
CO2 TOTAL: 23 mmol/L (ref 23–31)
CREATININE: 0.78 mg/dL (ref 0.60–1.05)
ESTIMATED GFR: 88 mL/min/BSA (ref >=60)
GLUCOSE: 133 mg/dL — ABNORMAL HIGH (ref 65–125)
POTASSIUM: 3.6 mmol/L (ref 3.5–5.1)
SODIUM: 138 mmol/L (ref 136–145)

## 2020-07-04 LAB — CBC W/AUTO DIFF
BASOPHIL #: 0 10*3/uL (ref 0.00–0.10)
BASOPHIL %: 0 % (ref 0–3)
EOSINOPHIL #: 0 10*3/uL (ref 0.00–0.50)
EOSINOPHIL %: 0 % (ref 0–5)
HCT: 37.7 % (ref 36.0–45.0)
HGB: 12.9 g/dL (ref 12.0–15.5)
LYMPHOCYTE #: 1.1 10*3/uL (ref 1.00–4.80)
LYMPHOCYTE %: 8 % — ABNORMAL LOW (ref 15–43)
MCH: 29 pg (ref 27.5–33.2)
MCHC: 34.1 g/dL (ref 32.0–36.0)
MCV: 85 fL (ref 82.0–97.0)
MONOCYTE #: 0.7 10*3/uL (ref 0.20–0.90)
MONOCYTE %: 5 % (ref 5–12)
MPV: 9.4 fL (ref 7.4–10.5)
NEUTROPHIL #: 12.3 10*3/uL — ABNORMAL HIGH (ref 1.50–6.50)
NEUTROPHIL %: 87 % — ABNORMAL HIGH (ref 43–76)
PLATELETS: 160 10*3/uL (ref 150–450)
RBC: 4.44 10*6/uL (ref 4.00–5.10)
RDW: 14.2 % (ref 11.0–16.0)
WBC: 14.2 10*3/uL — ABNORMAL HIGH (ref 4.0–11.0)

## 2020-07-04 LAB — POC FINGERSTICK GLUCOSE - BMC/JMC (RESULTS)
GLUCOSE, POC: 122 mg/dL — ABNORMAL HIGH (ref 60–100)
GLUCOSE, POC: 120 mg/dL — ABNORMAL HIGH (ref 60–100)
GLUCOSE, POC: 148 mg/dL — ABNORMAL HIGH (ref 60–100)
GLUCOSE, POC: 148 mg/dL — ABNORMAL HIGH (ref 60–100)

## 2020-07-04 LAB — MAGNESIUM: MAGNESIUM: 2.8 mg/dL — ABNORMAL HIGH (ref 1.8–2.6)

## 2020-07-04 LAB — PHOSPHORUS: PHOSPHORUS: 3.2 mg/dL (ref 2.3–4.0)

## 2020-07-04 NOTE — Nurses Notes (Signed)
Pt pleasant and cooperative throughout shift. Tele 31- SB, HR 40's-50's. VSS. Pt up to bathroom with one person assist. Neuro's remain unchanged. Pt oriented to person and place. Pt's appetite poor- per family pt does not eat much at home either. Pt given snacks in between meals. Dressing to left side of head ( biopsy site) intact. Call bell within reach. Fall and seizure precautions maintained. Will continue to monitor.

## 2020-07-04 NOTE — Progress Notes (Signed)
[facility]  [place], [place] [address]    INPATIENT PROGRESS NOTE      Carrie Escobar, Carrie Escobar  Date of Admission:  06/27/2020  Date of Birth:  10-Dec-1945  Date of Service:  07/04/2020         Assessment/ Plan:       Intracranial masses:  Multi focal. Concerning for glioblastoma v Lymphoma v Mets. Associated with weakness, memory impairment and lethargy. S/p Tissues biopsy 07/02/20. Awaiting path reports. CT chest/Abd/Plv only showing a thyroid mass. [place] of thyroid ordered; result noted. FNA recommended. Continue on prophylactic Keppra IV. Continue Decadron. Continue  Neuro checks.  Appreciate recommendation from Neurology, Neurosurgery, and Oncology. Falls, seizure, and aspiration precautions. Supportive care as needed.      Thyroid mass: Needs FNA per radiology. Will discuss with Oncology prior to consulting IR for this.       Abnormal EEG result: Delta wave noted concerning for seizure potential. Keppra to continue per Dr Reesa Chew. Seizure precautions. Fall precaution. IV ativan for breakthrough seizure. Supportive care.       Memory impairment/Confusion: Subacute. Ongoing for several weeks. Worsening.  Likely due to above diagnoses. Treat as outlined above. Further evaluation diagnosis as outlined above.      Lethargy: Subacute.  May be emanating from above diagnosis.  Treat as outlined above. PT OT evaluation and treatment.  Fall precautions.      Acute metabolic encephalopathy:  Mild to moderate.  Likely due to #1 diagnosis.  Treat as outlined above.  Neuro checks.  Supportive care.        Hypertension: Benign. Shy of goal. Resume oral regimen. Adjust as needed.       Diabetes mellitus: Type 2. Patient on Metformin. Will continue same. Sliding scale insulin coverage as needed. Fingersticks.        Prophylaxis:  SCD      Disposition:  Home when workup and treatment are completed.          Chief Complaint:  Intracranial Masses, sleepiness, forgetfulness, fatigue.      Subjective: Pt seen.  Confused.      ROS: Denies fever/chills. No chest pain/SOB. No abdominal pain, nausea, or vomiting.         Today's Physical Exam:  BP (!) 149/83   Pulse 55   Temp 36.6 C (97.9 F)   Resp 18   Ht 1.651 m (5\' 5" )   Wt 70.1 kg (154 lb 8.7 oz)   SpO2 95%   BMI 25.72 kg/m       General: Confused, No acute distress.   Eyes: Pupils equal and round, reactive to light and accomodation.   HENT:Head atraumatic and normocephalic   Neck: No JVD or thyromegaly or lymphadenopathy   Lungs: Clear to auscultation bilaterally.   Cardiovascular: regular rate and rhythm, S1, S2 normal, no murmur,   Abdomen: Soft, non-tender, Bowel sounds normal, No hepatosplenomegaly   Extremities: extremities normal, atraumatic, no cyanosis or edema   Skin: Skin warm and dry   Neurologic: Confused   Lymphatics: No lymphadenopathy   Psychiatric: Normal affect and behavior.    Current Medications:  [Held by provider] amLODIPine (NORVASC) tablet, 10 mg, Oral, Daily  dexamethasone 4 mg/mL injection, 4 mg, Intravenous, Q6H  fluticasone (FLONASE) 50 mcg per spray nasal spray, 1 Spray, Each Nostril, Daily  levETIRAcetam (KEPPRA) tablet, 1,000 mg, Oral, 2x/day  lisinopril (PRINIVIL) tablet, 5 mg, Oral, Daily  LORazepam (ATIVAN) 2 mg/mL injection, 2 mg, Intravenous, Q4H PRN  metFORMIN (GLUCOPHAGE  XR) extended release tablet, 500 mg, Oral, Daily  NS 250 mL flush bag, , Intravenous, Q1H PRN  NS flush syringe, 10 mL, Intravenous, Q8HRS  NS flush syringe, 10 mL, Intravenous, Q1H PRN  SSIP insulin lispro (HUMALOG) 100 units/mL SubQ pen, 1-12 Units, Subcutaneous, 4x/day AC  [Held by provider] timolol (TIMOPTIC) 0.25% ophthalmic solution, 1 Drop, Right Eye, 2x/day  traMADol (ULTRAM) tablet, 50 mg, Oral, Q6H PRN          I/O:  I/O last 24 hours:      Intake/Output Summary (Last 24 hours) at 07/04/2020 0905  Last data filed at 07/03/2020 1800  Gross per 24 hour   Intake 240 ml   Output -   Net 240 ml     I/O current shift:  No intake/output data  recorded.      Laboratory Results:    Reviewed:   Results for orders placed or performed during the hospital encounter of 06/27/20 (from the past 24 hour(s))   POC FINGERSTICK GLUCOSE - [facility] (RESULTS)   Result Value Ref Range    GLUCOSE, POC 132 (H) 60 - 100 mg/dl   POC FINGERSTICK GLUCOSE - [facility] (RESULTS)   Result Value Ref Range    GLUCOSE, POC 140 (H) 60 - 100 mg/dl   POC FINGERSTICK GLUCOSE - [facility] (RESULTS)   Result Value Ref Range    GLUCOSE, POC 133 (H) 60 - 100 mg/dl   POC FINGERSTICK GLUCOSE - [facility] (RESULTS)   Result Value Ref Range    GLUCOSE, POC 122 (H) 60 - 100 mg/dl   BASIC METABOLIC PANEL   Result Value Ref Range    SODIUM 138 136 - 145 mmol/L    POTASSIUM 3.6 3.5 - 5.1 mmol/L    CHLORIDE 106 96 - 111 mmol/L    CO2 TOTAL 23 23 - 31 mmol/L    ANION GAP 9 4 - 13 mmol/L    CALCIUM 9.0 8.8 - 10.2 mg/dL    GLUCOSE 133 (H) 65 - 125 mg/dL    BUN 19 8 - 25 mg/dL    CREATININE 0.78 0.60 - 1.05 mg/dL    BUN/CREA RATIO 24 (H) 6 - 22    ESTIMATED GFR 88 >=60 mL/min/BSA   CBC W/AUTO DIFF   Result Value Ref Range    WBC 14.2 (H) 4.0 - 11.0 x10^3/uL    RBC 4.44 4.00 - 5.10 x10^6/uL    HGB 12.9 12.0 - 15.5 g/dL    HCT 37.7 36.0 - 45.0 %    MCV 85.0 82.0 - 97.0 fL    MCH 29.0 27.5 - 33.2 pg    MCHC 34.1 32.0 - 36.0 g/dL    RDW 14.2 11.0 - 16.0 %    PLATELETS 160 150 - 450 x10^3/uL    MPV 9.4 7.4 - 10.5 fL    NEUTROPHIL % 87 (H) 43 - 76 %    LYMPHOCYTE % 8 (L) 15 - 43 %    MONOCYTE % 5 5 - 12 %    EOSINOPHIL % 0 0 - 5 %    BASOPHIL % 0 0 - 3 %    NEUTROPHIL # 12.30 (H) 1.50 - 6.50 x10^3/uL    LYMPHOCYTE # 1.10 1.00 - 4.80 x10^3/uL    MONOCYTE # 0.70 0.20 - 0.90 x10^3/uL    EOSINOPHIL # 0.00 0.00 - 0.50 x10^3/uL    BASOPHIL # 0.00 0.00 - 0.10 x10^3/uL   MAGNESIUM   Result Value Ref Range    MAGNESIUM  2.8 (H) 1.8 - 2.6 mg/dL   PHOSPHORUS   Result Value Ref Range    PHOSPHORUS 3.2 2.3 - 4.0 mg/dL     Ordered:    Lab orders   No lab orders         Radiological Studies:  Reviewed:   Results for orders placed or  performed during the hospital encounter of 06/27/20 (from the past 24 hour(s))   CT BRAIN WO IV CONTRAST     Status: None    Narrative    [name]  Female, 74 years old.    CT BRAIN WO IV CONTRAST performed on 07/03/2020 1:48 PM.    REASON FOR EXAM:  AMS, s/p brain biopsy    RADIATION DOSE: 1141.80 mGycm    TECHNIQUE: Noncontrast MRI of the brain.    COMPARISON: July 02, 2020.    FINDINGS:  There is redemonstration of postoperative changes from left  temporal lobe mass is seen. Small at the biopsy site is unchanged. The  multifocal masses involving the left parietal, temporal and occipital lobes  with connecting white matter lucency are unchanged in appearance given the  differences in technique. There is unchanged surrounding edema. Localized  mass effect and mild dilation of the temporal horn of the left lateral  ventricle is unchanged. No new hemorrhage, gray-white loss or mass effect  is identified.      Impression    Stable appearance of a left hemisphere mass status post biopsy. No new  hemorrhage or gray-white loss is identified. There is unchanged mass effect  associated with the tumor.      Radiologist location ID: [id number]       Ordered:  CT BRAIN WO IV CONTRAST  XR AP MOBILE CHEST  MRI BRAIN W/WO CONTRAST  CT CHEST ABDOMEN PELVIS W IV CONTRAST  MRI BRAIN STEALTH W/WO CONTRAST  US THYROID  CT BRAIN STEALTH W CONTRAST  CT BRAIN WO IV CONTRAST  CT BRAIN WO IV CONTRAST    Problem List:  Active Hospital Problems   (*Primary Problem)    Diagnosis   . *Brain mass   . Memory impairment   . Acute metabolic encephalopathy         Peter Garter, MD, 07/04/2020,  09:05

## 2020-07-04 NOTE — Nurses Notes (Signed)
Pt up in chair at this time. Pt assist of one to transfer.

## 2020-07-04 NOTE — Care Plan (Signed)
Problem: Acute Neurologic Deterioration  Goal: Absence of Acute Neurologic Symptoms  Outcome: Ongoing (see interventions/notes)     Problem: Skin Injury Risk Increased  Goal: Skin Health and Integrity  Outcome: Ongoing (see interventions/notes)     Problem: Cognitive Impairment  Goal: Optimal Functional Independence  Outcome: Ongoing (see interventions/notes)     Problem: Diabetes Comorbidity  Goal: Blood Glucose Level Within Targeted Range  Outcome: Ongoing (see interventions/notes)     Problem: Seizure, Active Management  Goal: Absence of Seizure/Seizure-Related Injury  Outcome: Ongoing (see interventions/notes)     Problem: Adult Inpatient Plan of Care  Goal: Rounds/Family Conference  Outcome: Ongoing (see interventions/notes)     Problem: Adult Inpatient Plan of Care  Goal: Optimal Comfort and Wellbeing  Outcome: Ongoing (see interventions/notes)     Problem: Adult Inpatient Plan of Care  Goal: Plan of Care Review  Outcome: Ongoing (see interventions/notes)

## 2020-07-05 LAB — POC FINGERSTICK GLUCOSE - BMC/JMC (RESULTS)
GLUCOSE, POC: 132 mg/dL — ABNORMAL HIGH (ref 60–100)
GLUCOSE, POC: 141 mg/dL — ABNORMAL HIGH (ref 60–100)
GLUCOSE, POC: 132 mg/dL — ABNORMAL HIGH (ref 60–100)
GLUCOSE, POC: 149 mg/dL — ABNORMAL HIGH (ref 60–100)

## 2020-07-05 LAB — CBC WITH DIFF
BASOPHIL #: 0 10*3/uL (ref 0.00–0.10)
BASOPHIL %: 0 % (ref 0–3)
EOSINOPHIL #: 0 10*3/uL (ref 0.00–0.50)
EOSINOPHIL %: 0 % (ref 0–5)
HCT: 38.7 % (ref 36.0–45.0)
HGB: 13.1 g/dL (ref 12.0–15.5)
LYMPHOCYTE #: 0.9 10*3/uL — ABNORMAL LOW (ref 1.00–4.80)
LYMPHOCYTE %: 6 % — ABNORMAL LOW (ref 15–43)
MCH: 28.8 pg (ref 27.5–33.2)
MCHC: 33.8 g/dL (ref 32.0–36.0)
MCV: 85.4 fL (ref 82.0–97.0)
MONOCYTE #: 0.7 10*3/uL (ref 0.20–0.90)
MONOCYTE %: 5 % (ref 5–12)
MPV: 9.5 fL (ref 7.4–10.5)
NEUTROPHIL #: 12.9 10*3/uL — ABNORMAL HIGH (ref 1.50–6.50)
NEUTROPHIL %: 89 % — ABNORMAL HIGH (ref 43–76)
PLATELETS: 139 10*3/uL — ABNORMAL LOW (ref 150–450)
RBC: 4.54 10*6/uL (ref 4.00–5.10)
RDW: 14.3 % (ref 11.0–16.0)
WBC: 14.5 10*3/uL — ABNORMAL HIGH (ref 4.0–11.0)

## 2020-07-05 LAB — BASIC METABOLIC PANEL
ANION GAP: 9 mmol/L (ref 4–13)
BUN/CREA RATIO: 24 — ABNORMAL HIGH (ref 6–22)
BUN: 18 mg/dL (ref 8–25)
CALCIUM: 9 mg/dL (ref 8.8–10.2)
CHLORIDE: 103 mmol/L (ref 96–111)
CO2 TOTAL: 23 mmol/L (ref 23–31)
CREATININE: 0.74 mg/dL (ref 0.60–1.05)
ESTIMATED GFR: >90 mL/min/BSA (ref >=60)
GLUCOSE: 121 mg/dL (ref 65–125)
POTASSIUM: 3.6 mmol/L (ref 3.5–5.1)
SODIUM: 135 mmol/L — ABNORMAL LOW (ref 136–145)

## 2020-07-05 NOTE — Progress Notes (Signed)
[facility]  [place], [place] [address]    INPATIENT PROGRESS NOTE      Carrie Escobar, Carrie Escobar  Date of Admission:  06/27/2020  Date of Birth:  1946/08/31  Date of Service:  07/05/2020         Assessment/ Plan:       Intracranial masses:  Multi focal. Concerning for glioblastoma v Lymphoma v Mets. Associated with weakness, memory impairment and lethargy. S/p Tissues biopsy 07/02/20. Awaiting path reports. CT chest/Abd/Plv only showing a thyroid mass. [place] of thyroid ordered; result noted. FNA recommended. Continue on prophylactic Keppra IV. Continue Decadron. Continue  Neuro checks.  Appreciate recommendation from Neurology, Neurosurgery, and Oncology. Falls, seizure, and aspiration precautions. Supportive care as needed.      Thyroid mass: Needs FNA per radiology. Will discuss with Oncology prior to consulting IR for this.       Abnormal EEG result: Delta wave noted concerning for seizure potential. Keppra to continue per Dr Reesa Chew. Seizure precautions. Fall precaution. IV ativan for breakthrough seizure. Supportive care.       Memory impairment/Confusion: Subacute. Ongoing for several weeks. Worsening.  Likely due to above diagnoses. Treat as outlined above. Further evaluation diagnosis as outlined above.      Lethargy: Subacute.  May be emanating from above diagnosis.  Treat as outlined above. PT OT evaluation and treatment.  Fall precautions.      Acute metabolic encephalopathy:  Mild to moderate.  Likely due to #1 diagnosis.  Treat as outlined above.  Neuro checks.  Supportive care.        Hypertension: Benign. Shy of goal. Resume oral regimen. Adjust as needed.       Diabetes mellitus: Type 2. Patient on Metformin. Will continue same. Sliding scale insulin coverage as needed. Fingersticks.        Prophylaxis:  SCD      Disposition:  Home when workup and treatment are completed.          Chief Complaint:  Intracranial Masses, sleepiness, forgetfulness, fatigue.      Subjective: Pt seen.  Confused.      ROS: Denies fever/chills. No chest pain/SOB. No abdominal pain, nausea, or vomiting.         Today's Physical Exam:  BP (!) 161/86 Comment: pt upset, tearful   Pulse (!) 108    Temp 36.4 C (97.5 F)    Resp 18    Ht 1.651 m (5\' 5" )    Wt 70.1 kg (154 lb 8.7 oz)    SpO2 95%    BMI 25.72 kg/m       General: Confused, No acute distress.   Eyes: Pupils equal and round, reactive to light and accomodation.   HENT:Head atraumatic and normocephalic   Neck: No JVD or thyromegaly or lymphadenopathy   Lungs: Clear to auscultation bilaterally.   Cardiovascular: regular rate and rhythm, S1, S2 normal, no murmur,   Abdomen: Soft, non-tender, Bowel sounds normal, No hepatosplenomegaly   Extremities: extremities normal, atraumatic, no cyanosis or edema   Skin: Skin warm and dry   Neurologic: Confused   Lymphatics: No lymphadenopathy   Psychiatric: Normal affect and behavior.    Current Medications:  [Held by provider] amLODIPine (NORVASC) tablet, 10 mg, Oral, Daily  dexamethasone 4 mg/mL injection, 4 mg, Intravenous, Q6H  fluticasone (FLONASE) 50 mcg per spray nasal spray, 1 Spray, Each Nostril, Daily  levETIRAcetam (KEPPRA) tablet, 1,000 mg, Oral, 2x/day  lisinopril (PRINIVIL) tablet, 5 mg, Oral, Daily  LORazepam (ATIVAN)  2 mg/mL injection, 2 mg, Intravenous, Q4H PRN  metFORMIN (GLUCOPHAGE XR) extended release tablet, 500 mg, Oral, Daily  NS 250 mL flush bag, , Intravenous, Q1H PRN  NS flush syringe, 10 mL, Intravenous, Q8HRS  NS flush syringe, 10 mL, Intravenous, Q1H PRN  SSIP insulin lispro (HUMALOG) 100 units/mL SubQ pen, 1-12 Units, Subcutaneous, 4x/day AC  [Held by provider] timolol (TIMOPTIC) 0.25% ophthalmic solution, 1 Drop, Right Eye, 2x/day  traMADol (ULTRAM) tablet, 50 mg, Oral, Q6H PRN          I/O:  I/O last 24 hours:      Intake/Output Summary (Last 24 hours) at 07/05/2020 1021  Last data filed at 07/05/2020 0900  Gross per 24 hour   Intake 720 ml   Output --   Net 720 ml     I/O current shift:  09/26  0700 - 09/26 1859  In: 240 [P.O.:240]  Out: -       Laboratory Results:    Reviewed:   Results for orders placed or performed during the hospital encounter of 06/27/20 (from the past 24 hour(s))   POC FINGERSTICK GLUCOSE - [facility] (RESULTS)   Result Value Ref Range    GLUCOSE, POC 120 (H) 60 - 100 mg/dl   POC FINGERSTICK GLUCOSE - [facility] (RESULTS)   Result Value Ref Range    GLUCOSE, POC 148 (H) 60 - 100 mg/dl   POC FINGERSTICK GLUCOSE - [facility] (RESULTS)   Result Value Ref Range    GLUCOSE, POC 148 (H) 60 - 100 mg/dl   BASIC METABOLIC PANEL - AM ONCE   Result Value Ref Range    SODIUM 135 (L) 136 - 145 mmol/L    POTASSIUM 3.6 3.5 - 5.1 mmol/L    CHLORIDE 103 96 - 111 mmol/L    CO2 TOTAL 23 23 - 31 mmol/L    ANION GAP 9 4 - 13 mmol/L    CALCIUM 9.0 8.8 - 10.2 mg/dL    GLUCOSE 121 65 - 125 mg/dL    BUN 18 8 - 25 mg/dL    CREATININE 0.74 0.60 - 1.05 mg/dL    BUN/CREA RATIO 24 (H) 6 - 22    ESTIMATED GFR >90 >=60 mL/min/BSA   CBC WITH DIFF   Result Value Ref Range    WBC 14.5 (H) 4.0 - 11.0 x103/uL    RBC 4.54 4.00 - 5.10 x106/uL    HGB 13.1 12.0 - 15.5 g/dL    HCT 38.7 36.0 - 45.0 %    MCV 85.4 82.0 - 97.0 fL    MCH 28.8 27.5 - 33.2 pg    MCHC 33.8 32.0 - 36.0 g/dL    RDW 14.3 11.0 - 16.0 %    PLATELETS 139 (L) 150 - 450 x103/uL    MPV 9.5 7.4 - 10.5 fL    NEUTROPHIL % 89 (H) 43 - 76 %    LYMPHOCYTE % 6 (L) 15 - 43 %    MONOCYTE % 5 5 - 12 %    EOSINOPHIL % 0 0 - 5 %    BASOPHIL % 0 0 - 3 %    NEUTROPHIL # 12.90 (H) 1.50 - 6.50 x103/uL    LYMPHOCYTE # 0.90 (L) 1.00 - 4.80 x103/uL    MONOCYTE # 0.70 0.20 - 0.90 x103/uL    EOSINOPHIL # 0.00 0.00 - 0.50 x103/uL    BASOPHIL # 0.00 0.00 - 0.10 x103/uL   POC FINGERSTICK GLUCOSE - [facility] (RESULTS)   Result Value Ref  Range    GLUCOSE, POC 132 (H) 60 - 100 mg/dl     Ordered:    Lab orders   No lab orders         Radiological Studies:  Reviewed:      Ordered:  CT BRAIN WO IV CONTRAST  XR AP MOBILE CHEST  MRI BRAIN W/WO CONTRAST  CT CHEST ABDOMEN PELVIS W IV  CONTRAST  MRI BRAIN STEALTH W/WO CONTRAST  US THYROID  CT BRAIN STEALTH W CONTRAST  CT BRAIN WO IV CONTRAST  CT BRAIN WO IV CONTRAST    Problem List:  Active Hospital Problems   (*Primary Problem)    Diagnosis    *Brain mass    Memory impairment    Acute metabolic encephalopathy         Peter Garter, MD, 07/05/2020,  10:21

## 2020-07-05 NOTE — Nurses Notes (Signed)
Pt states she did not eat lunch.  Pt is tearful at this time. Pt states "I just want to go home."

## 2020-07-06 DIAGNOSIS — E041 Nontoxic single thyroid nodule: Secondary | ICD-10-CM

## 2020-07-06 LAB — POC FINGERSTICK GLUCOSE - BMC/JMC (RESULTS)
GLUCOSE, POC: 122 mg/dL — ABNORMAL HIGH (ref 60–100)
GLUCOSE, POC: 191 mg/dL — ABNORMAL HIGH (ref 60–100)
GLUCOSE, POC: 144 mg/dL — ABNORMAL HIGH (ref 60–100)
GLUCOSE, POC: 188 mg/dL — ABNORMAL HIGH (ref 60–100)

## 2020-07-06 MED ORDER — DEXAMETHASONE 4 MG TABLET
4.0000 mg | ORAL_TABLET | Freq: Four times a day (QID) | ORAL | Status: DC
Start: 2020-07-06 — End: 2020-07-08
  Administered 2020-07-07 – 2020-07-08 (×6): 4 mg via ORAL
  Filled 2020-07-06 (×6): qty 1

## 2020-07-06 MED ORDER — LISINOPRIL 10 MG TABLET
10.0000 mg | ORAL_TABLET | Freq: Every day | ORAL | Status: DC
Start: 2020-07-07 — End: 2020-07-08
  Administered 2020-07-07 – 2020-07-08 (×2): 10 mg via ORAL
  Filled 2020-07-06 (×2): qty 1

## 2020-07-06 NOTE — Nurses Notes (Signed)
Pt up walking with PT in  Hallway.

## 2020-07-06 NOTE — Progress Notes (Addendum)
[facility]  [place], [place] [address]    INPATIENT PROGRESS NOTE      Escobar, Carrie  Date of Admission:  06/27/2020  Date of Birth:  1946-07-21  Date of Service:  07/06/2020         Assessment/ Plan:       Intracranial masses:  Multi focal. Concerning for glioblastoma v Lymphoma v Mets. Associated with weakness, memory impairment and lethargy.   - pt remains confused, disoriented. No communication possible with the patient. Repeat CT of the head 9/24 does not show any new changes.   -CT chest/Abd/Plv showing a thyroid mass. [place] of thyroid showing Left thyroid nodule. FNAC recommended. D/w Dr. Holley Dexter, he informs me that he will get back to me after seeing the patient.  - s/p off ASA x 5 days.  Follow-up with Neurosurgery about when to restart aspirin.  - Continue on prophylactic Keppra IV. Continue Decadron. Continue  Neuro checks.  Appreciate recommendation from Neurology, Neurosurgery, and Oncology. Falls, seizure, and aspiration precautions. Supportive care as needed.    Abnormal EEG result: Delta wave noted concerning for seizure potential. Keppra to continue per Dr Reesa Chew. Seizure precautions. Fall precaution. IV ativan for breakthrough seizure. Supportive care.     Memory impairment/Confusion: Subacute. Ongoing for several weeks. Worsening.  Likely due to above diagnoses. Treat as outlined above. Further evaluation diagnosis as outlined above.    Lethargy/acute metabolic encephalopathy: Subacute.  May be emanating from above diagnosis.  Treat as outlined above. PT OT recommends rehab/SNF. Fall precautions.    Hypertension, uncontrolled.: Benign.  Norvasc held, dose of lisinopril increased to 10 mg po daily.     Diabetes mellitus: Type 2. Patient on Metformin. Will continue same. Sliding scale insulin coverage as needed. Fingersticks.      Bradycardia  - HR significantly better.  - seen by cardiology. May be having OSA, needs outpatient sleep study.  - EKG reviewed by myself,  showing NSR, sinus bradycardia, no acute ST/T wave changes.   - continue to hold timolol eye drops and Norvasc.  - continue with cardiac monitoring.    Leukocytosis  - likely sec to steriod  - no evidence of active infection at this time.  - follow up on CBC      Prophylaxis:  SCD      Disposition:  D/w CM, it would be difficult for her to go to SNF and would likely need to go home. I spoke with her brother, mr. [name]. He informed me that family would unable to take her home before Wednesday 9/29- Informed CM, Ms. Bonnita Nasuti.          Chief Complaint:  Intracranial Masses, sleepiness, forgetfulness, fatigue.      Subjective:  Confused, disoriented. No communication possible.    D/w RN.      12 point ROS have been negative except as mentioned          Today's Physical Exam:  BP 127/72    Pulse 76    Temp 36.2 C (97.1 F)    Resp 18    Ht 1.651 m (5\' 5" )    Wt 69.7 kg (153 lb 10.6 oz)    SpO2 97%    BMI 25.57 kg/m       General: lethargic, No acute distress.   Eyes: Pupils equal and round, reactive to light and accomodation.   HENT:Head atraumatic and normocephalic   Neck: No JVD or thyromegaly or lymphadenopathy   Lungs: Clear to  auscultation bilaterally.   Cardiovascular: HR improved. Regular rhythm, S1, S2 normal, no murmur,   Abdomen: Soft, non-tender, Bowel sounds normal, No hepatosplenomegaly   Extremities: extremities normal, atraumatic, no cyanosis or edema   Skin: Skin warm and dry   Neurologic:   Confused/disoriented.  Lymphatics: No lymphadenopathy   Psychiatric:confused, disoriented.     Current Medications:  [Held by provider] amLODIPine (NORVASC) tablet, 10 mg, Oral, Daily  dexamethasone 4 mg/mL injection, 4 mg, Intravenous, Q6H  fluticasone (FLONASE) 50 mcg per spray nasal spray, 1 Spray, Each Nostril, Daily  levETIRAcetam (KEPPRA) tablet, 1,000 mg, Oral, 2x/day  [START ON 07/07/2020] lisinopril (PRINIVIL) tablet, 10 mg, Oral, Daily  LORazepam (ATIVAN) 2 mg/mL injection, 2 mg, Intravenous, Q4H  PRN  metFORMIN (GLUCOPHAGE XR) extended release tablet, 500 mg, Oral, Daily  NS 250 mL flush bag, , Intravenous, Q1H PRN  NS flush syringe, 10 mL, Intravenous, Q8HRS  NS flush syringe, 10 mL, Intravenous, Q1H PRN  SSIP insulin lispro (HUMALOG) 100 units/mL SubQ pen, 1-12 Units, Subcutaneous, 4x/day AC  [Held by provider] timolol (TIMOPTIC) 0.25% ophthalmic solution, 1 Drop, Right Eye, 2x/day  traMADol (ULTRAM) tablet, 50 mg, Oral, Q6H PRN          I/O:  I/O last 24 hours:      Intake/Output Summary (Last 24 hours) at 07/06/2020 1600  Last data filed at 07/06/2020 1300  Gross per 24 hour   Intake 480 ml   Output --   Net 480 ml     I/O current shift:  09/27 0700 - 09/27 1859  In: 480 [P.O.:480]  Out: -       Laboratory Results:    Reviewed:   Results for orders placed or performed during the hospital encounter of 06/27/20 (from the past 24 hour(s))   POC FINGERSTICK GLUCOSE - [facility] (RESULTS)   Result Value Ref Range    GLUCOSE, POC 132 (H) 60 - 100 mg/dl   POC FINGERSTICK GLUCOSE - [facility] (RESULTS)   Result Value Ref Range    GLUCOSE, POC 149 (H) 60 - 100 mg/dl   POC FINGERSTICK GLUCOSE - [facility] (RESULTS)   Result Value Ref Range    GLUCOSE, POC 122 (H) 60 - 100 mg/dl   POC FINGERSTICK GLUCOSE - [facility] (RESULTS)   Result Value Ref Range    GLUCOSE, POC 191 (H) 60 - 100 mg/dl     Ordered:    Lab orders   No lab orders         Radiological Studies:  Reviewed:      Ordered:  CT BRAIN WO IV CONTRAST  XR AP MOBILE CHEST  MRI BRAIN W/WO CONTRAST  CT CHEST ABDOMEN PELVIS W IV CONTRAST  MRI BRAIN STEALTH W/WO CONTRAST  US THYROID  CT BRAIN STEALTH W CONTRAST  CT BRAIN WO IV CONTRAST  CT BRAIN WO IV CONTRAST    Problem List:  Active Hospital Problems   (*Primary Problem)    Diagnosis    *Brain mass    Memory impairment    Acute metabolic encephalopathy         Gillis Santa, MD, 07/06/2020,  16:00

## 2020-07-06 NOTE — Care Management Notes (Addendum)
07/06/20 1000   Assessment Detail   Assessment Type Continued Assessment   Date of Care Management Update 07/06/20   Social Work Plan   Discharge Planning Status plan in progress   Projected Discharge Date   (undetermined)   Anticipated Discharge Disposition [facility]   Skilled Nursing   (statewide)   Plan [facility]   Patient/Family in Agreement with Plan yes   Plan Comments Needs 30 cardiac monitor at DC   Discharge Needs Assessment   Concerns To Be Addressed adjustment to diagnosis/illness concerns   Concerns Comments ? Home with [facility] Currently Used at [place] After Discharge walker, rolling   Referral Information   Care Manager Assigned to Case Acie Fredrickson, RNCM     CM Update: Currently no bed offer for SNF.  Search has been widely expanded.  07/03/20 PT documents continues recommendation for SNF placement.  Awaiting brain bx results.  Cardiology has noted the need for a 30 day cardiac monitor be placed at discharge.  Pt is monitored on tele here which demonstrates SB 40-50's.  Pt lives in a single story home with her brother.  CM has left a VMM for pt brother & MPOA Gwendy Boeder, to discuss DC alternative.  We need to discuss Home Health services & paid care provider services.      RC call from [name] & lengthy discussion regarding DC plan & possibilities other than SNF.  CM inquired if pt could return to home safely with 24/7 care provider & home health care services if it was determined to be safe.  [name] indicated that he & another sister are going to be with pt at her home in [place] 24/7 knowing that the SNF issue is not working out.  CM explained the biopsy process (outpt & usually a week for results with treatment options also outpt).  CM discussed the pt current cardiologist recommendations for a 30 day monitor at DC.  Mr. Fahmy was agreeable to the idea of home health services & pt returning home.  CM reviewed agencies with him & he  indicated [facility].  For DME he selected [facility].  CM completed choices she & placed in bin for [facility] Murray Hodgkins to scan into [employer].      Secure chat message to Dr. Manuela Schwartz to discuss.

## 2020-07-06 NOTE — Care Plan (Signed)
CM Update: Currently no bed offer for SNF.  Search has been widely expanded.  07/03/20 PT documents continues recommendation for SNF placement.  Awaiting brain bx results.  Cardiology has noted the need for a 30 day cardiac monitor be placed at discharge.  Pt is monitored on tele here which demonstrates SB 40-50's.  Pt lives in a single story home with her brother.  CM has left a VMM for pt brother & MPOA Carrie Escobar, to discuss DC alternative.  We need to discuss Home Health services & paid care provider services.      RC call from Carrie Escobar & lengthy discussion regarding DC plan & possibilities other than SNF.  CM inquired if pt could return to home safely with 24/7 care provider & home health care services if it was determined to be safe.  Carrie Escobar indicated that he & another sister are going to be with pt at her home in [name] 24/7 knowing that the SNF issue is not working out.  CM explained the biopsy process (outpt & usually a week for results with treatment options also outpt).  CM discussed the pt current cardiologist recommendations for a 30 day monitor at DC.  Carrie Escobar was agreeable to the idea of home health services & pt returning home.  CM reviewed agencies with him & he indicated [facility].  For DME he selected [facility].  CM completed choices she & placed in bin for AA Murray Hodgkins to scan into [employer].      Secure chat message to Dr. Manuela Schwartz to discuss.

## 2020-07-06 NOTE — Care Management Notes (Signed)
The patient's Capacity, SNF List and approved PAS have been scanned into Epic.

## 2020-07-06 NOTE — Nurses Notes (Signed)
Pt taken via wheelchair with all belongings.

## 2020-07-06 NOTE — Nurses Notes (Signed)
Report called to oncology [facility].  Pt updated on transfer. [name] [name] also called and updated.  Stated understanding.

## 2020-07-06 NOTE — Care Plan (Signed)
Multiple verbal directions for pt to do any task. She is very pleasant and cooperative. Gait is poor at times but no overt LOB. Walks towards her right and needs verbal direction for each step and multiple directions. Will need 24/7 care at home.     Plan:  Continue PT per plan of care    PT Recommendations for Nursing:  Up for meals and to bathroom   PT Recommendations for Patient/Family::  Not to get up without help.        Noralee Chars, PTA

## 2020-07-06 NOTE — Progress Notes (Signed)
[facility]  Oncology Consult  Follow Up Note    Carrie Escobar, Carrie Escobar, 74 y.o. female  Date of Service: 07/06/2020  Date of Birth:  1946/09/14    Hospital Day:  LOS: 9 days     Assessment: Carrie Escobar is a 74 y.o. 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  I discussed pending path with Dr. Arsenio Katz and final path expected within the week.  Staging CT C/A/P did not find alternative primary cancer site from where mets to brain is possible.  There is incidental 1.5cm left thyroid nodule confirmed on u/s for which FNA is recommended.  Given potential for highly morbid malignancy (GBM) for which patient is likely to die from despite treatment, I do not see an immediate need to biopsy her thyroid, a step that can be taken in the future.  Her treatment is going to be quite challenging and determination ought to begin for placement/family meeting/ discharge/goal of care.    Heme/Onc Diagnosis:   1. Multiple brain mass  2. Left thyroid nodule  3. Mental status changes    Recommendations:   1. Continue supportive dex, anti-seizure  2. Await final path  3. Will consult radonc when path available    Will continue to follow, please call with any questions    Mertie Moores, MD     ________________________________________________________________________________________________________________________________________________     Subjective:   unable to obtain due to mental status changes and confabulatino    ROS:     Review of Systems   Unable to perform ROS: Mental status change        Objective   Temperature: 36.2 C (97.1 F)  Heart Rate: 76  BP (Non-Invasive): 127/72  Respiratory Rate: 18  SpO2: 97 %  Physical Exam  HENT:      Head:      Comments: left parietal surgical site clean  Cardiovascular:      Rate and Rhythm: Regular rhythm.      Heart sounds: Normal heart sounds.   Pulmonary:       Effort: Pulmonary effort is normal.      Breath sounds: Normal breath sounds.   Abdominal:      Palpations: Abdomen is soft. There is no mass.   Neurological:      Mental Status: She is alert. She is disoriented.      Comments: significant confabulation and tangential          Labs:  I have reviewed all lab results.    Imaging Studies:  All pertinent images in Epic reviewed          Mertie Moores, MD

## 2020-07-06 NOTE — Ancillary Notes (Signed)
[facility]  Medical Nutrition Therapy Screen Note                                      Date of Service: 07/06/2020    Reason for Note: Nutrition Screen Follow-Up    Current Diet Order/Nutrition Support:  DIET DIABETIC/ADA Ensure High Protein; Ensure HP once daily with lunch    Reviewed patient status, diet order/TF/TPN, labs and medications.  Height: 165.1 cm (5\' 5" )   Weight: 69.7 kg (153 lb 10.6 oz) (07/06/20 0445)   Body mass index is 25.57 kg/m.    Brief Subjective:   Pt continues on a Diabetic diet with Ensure High Protein once daily. Intakes fair at 46% average x 6 days. All meal intakes documented yesterday at 50%. Noted to be consuming nutrition supplements.   S/p left temporal stealth guided burr hole and tumor biopsy for brain mass 9/23.   Braden score= 19; nutrition=3, adequate.   Wt is up 10# since previous nutrition screen (inaccurate representation of dry weight gain in short period of time; likely influenced by fluid status). Will continue to monitor.   Medications: Reviewed.   Lab Values: Na- 135 (L), POC Glucose- 122-149 x past 24 hours     Most Recent Screen Previous screen   Impaired Nutrition Status Score Impaired Nutrition Status Score: 1 - Food intake below 50 - 75% of normal requirement in preceding week - Mild (07/06/20 0800)   Impaired Nutrition Status Score: 0 - Normal nutritional status (06/28/20 0600)           Severity of Disease Score Severity of Disease Score: 0 - Normal nutritional requirements - Absent (07/06/20 0800)   Severity of Disease Score: 0 - Normal nutritional requirements - Absent (06/28/20 0600)       Age Age: 74 - > 70 years (07/06/20 0800) Age: 72 - > 70 years (06/28/20 0600)     Score Score: 2 (07/06/20 0800)     Score: 1 (06/28/20 0600)   Risk Level Risk Level: Level 2 - 2 pts - No initial note required. Follow-up within 5 days (07/06/20 0800)   Risk Level: Level 1 - 0 to 1 pt - No initial note required. Follow up within  7 days (06/28/20 0600)         Nutrition related problems: Unintentional weight loss (maintains body weight which is appropriate for age at this time)- IN PROGRESS- now up 10# since previous nutrition screen    Assessment: Needs assessed/reassessed 6-8 days should pt remain admitted     Monitor: Ongoing PO Intakes, Diet Tolerance, Weight Status     Plan/Intervention:   Increase nutrition supplement regimen to Ensure High Protein TID.   Will continue to monitor per screening protocol.    Rhona Raider, RDLD   Ext: [phone]

## 2020-07-06 NOTE — Care Management Notes (Addendum)
The patient's PAS has been updated, submitted and is pending at this time.     Notifications via Allscript:    [facility] - No, unable to accept patient. No available bed.     [facility] - No, unable to accept patient. No available bed.     Clinical updates sent to:    [facility]  [place] Unit  [place]    If long term care is a goal in any fashion, [place] and [place] would not be options. At this time, [place] are still closed to new admissions. SW will expand the search today if [place], [place] and/or [place] are unwilling / unable to extend a bed offer to the patient.

## 2020-07-06 NOTE — OT Treatment (Signed)
[facility]  Inpatient OT Progress Note    Patient Name: Carrie Escobar  Date of Birth: April 29, 1946  Height: 165.1 cm (5\' 5" )  Weight: 69.7 kg (153 lb 10.6 oz)  Room/Bed: 626/A  Payor: [place] MEDICARE / Plan: [place] MEDICARE ADVANTAGE PPO / Product Type: PPO /     Encounter Date: 06/27/2020 11:35 AM  Inpatient Admission Date: 06/27/2020    Admitting Diagnosis:  Brain mass [G93.89]    Subjective/Objective:   S: "I can do whatever you need me to do!"    O: Treatment Charge:  ADL x 1      ADL:  Pt completed bed mobility with supervision and was able to complete simple grooming and hygiene tasks with set-up. She stood with CGA and was able to walk to the sink and wash her hands with VCs for sequencing and to visually scan to her R.                            Assessment:   Tolerated session well. Pt requires verbal and tactile cues for visual attention to the R and requires consistent cues for task sequencing.She will need close 24 hour supervision for safety upon D/C.       Plan:     Continue to follow patient as appropriate according to established plan of care, progressing as   tolerated.     OT Recommendations for Nursing:  Encourage active participation with ADL.  OT Recommendations for Patient/Family:  Use call light for staff assistance.    The risks/benefits of therapy have been discussed with the patient and he/she is in agreement with the established plan of care.     Therapist :     Annie Paras, OT    Time may include review of chart notes, obtaining patient's functional history from patient/family/medical staff/case management/ancillary personnel, collaboration on findings and treatment options (with the above mentioned individuals), re-assessment, and acute care rehabilitation.

## 2020-07-06 NOTE — Nurses Notes (Signed)
Assumed care of pt and assessment completed.  Pt alert to person.  Speech clear, but difficulty with words.  No distress noted.  Continue to monitor.

## 2020-07-06 NOTE — PT Treatment (Signed)
[facility]  Physical Therapy Progress Note      Subjective:  Pt lying in bed need constant verbal directions.  To be d/c to home would need 24/7 care.       Areas of gait training assessed:  15 min      Ambulation surface:  Level, In patient's room and In hallway     Ambulation distance:  80 feet ( 40 feet out and back )      Ambulation ability:  Contact guard assist, Minimum assist and 1 person  Pt goes towards the right with and without the walker. For distance the walker may be safer for one person assist.      Assistive devices:  Gait belt, Rolling walker and None     Ambulation tolerance:  Fair     Comments: would suggest for distance use of walker and gait belt. Would not be safe to be up on her own.     Areas of functional mobility addressed:     Supine - Sit:  Verbal directions multiple pt will ask " what do you want "      Sit - Supine:  Verbal directions multiple      Sit - stand:  Verbal directions multiple      Balance while sitting:  Able to maintain position     Balance while standing: Unsteady - no physical help     Bed alarm reset:  Yes    Assessment:  Multiple verbal directions for pt to do any task. She is very pleasant and cooperative. Gait is poor at times but no overt LOB. Walks towards her right and needs verbal direction for each step and multiple directions. Will need 24/7 care at home.     Plan:  Continue PT per plan of care    PT Recommendations for Nursing:  Up for meals and to bathroom   PT Recommendations for Patient/Family::  Not to get up without help.        Noralee Chars, PTA

## 2020-07-06 NOTE — Nurses Notes (Signed)
Patient transferred to unit via wheelchair. Patient disoriented and is illogical but is cooperative and pleasant. Patient appears to be resting comfortably in bed. Bed alarms in place. VSS. No further needs at this time.

## 2020-07-06 NOTE — Care Plan (Signed)
Assessment:   Tolerated session well. Pt requires verbal and tactile cues for visual attention to the R and requires consistent cues for task sequencing.She will need close 24 hour supervision for safety upon D/C.       Plan:     Continue to follow patient as appropriate according to established plan of care, progressing as   tolerated.     OT Recommendations for Nursing:  Encourage active participation with ADL.  OT Recommendations for Patient/Family:  Use call light for staff assistance.    The risks/benefits of therapy have been discussed with the patient and he/she is in agreement with the established plan of care.     Therapist :     Annie Paras, OT

## 2020-07-07 ENCOUNTER — Non-Acute Institutional Stay: Payer: Medicare PPO

## 2020-07-07 ENCOUNTER — Other Ambulatory Visit: Payer: Self-pay

## 2020-07-07 LAB — CBC W/AUTO DIFF
BASOPHIL #: 0 10*3/uL (ref 0.00–0.10)
BASOPHIL %: 0 % (ref 0–3)
EOSINOPHIL #: 0 10*3/uL (ref 0.00–0.50)
EOSINOPHIL %: 0 % (ref 0–5)
HCT: 36.3 % (ref 36.0–45.0)
HGB: 12.5 g/dL (ref 12.0–15.5)
LYMPHOCYTE #: 1 10*3/uL (ref 1.00–4.80)
LYMPHOCYTE %: 6 % — ABNORMAL LOW (ref 15–43)
MCH: 29.2 pg (ref 27.5–33.2)
MCHC: 34.5 g/dL (ref 32.0–36.0)
MCV: 84.5 fL (ref 82.0–97.0)
MONOCYTE #: 0.8 10*3/uL (ref 0.20–0.90)
MONOCYTE %: 5 % (ref 5–12)
MPV: 9.5 fL (ref 7.4–10.5)
NEUTROPHIL #: 15 10*3/uL — ABNORMAL HIGH (ref 1.50–6.50)
NEUTROPHIL %: 89 % — ABNORMAL HIGH (ref 43–76)
PLATELETS: 116 10*3/uL — ABNORMAL LOW (ref 150–450)
RBC: 4.3 10*6/uL (ref 4.00–5.10)
RDW: 14.4 % (ref 11.0–16.0)
WBC: 16.8 10*3/uL — ABNORMAL HIGH (ref 4.0–11.0)

## 2020-07-07 LAB — BASIC METABOLIC PANEL
ANION GAP: 7 mmol/L (ref 4–13)
BUN/CREA RATIO: 26 — ABNORMAL HIGH (ref 6–22)
BUN: 18 mg/dL (ref 8–25)
CALCIUM: 8.6 mg/dL — ABNORMAL LOW (ref 8.8–10.2)
CHLORIDE: 101 mmol/L (ref 96–111)
CO2 TOTAL: 21 mmol/L — ABNORMAL LOW (ref 23–31)
CREATININE: 0.7 mg/dL (ref 0.60–1.05)
ESTIMATED GFR: >90 mL/min/BSA (ref >=60)
GLUCOSE: 134 mg/dL — ABNORMAL HIGH (ref 65–125)
POTASSIUM: 3.8 mmol/L (ref 3.5–5.1)
SODIUM: 129 mmol/L — ABNORMAL LOW (ref 136–145)

## 2020-07-07 LAB — POC FINGERSTICK GLUCOSE - BMC/JMC (RESULTS)
GLUCOSE, POC: 141 mg/dL — ABNORMAL HIGH (ref 60–100)
GLUCOSE, POC: 183 mg/dL — ABNORMAL HIGH (ref 60–100)
GLUCOSE, POC: 142 mg/dL — ABNORMAL HIGH (ref 60–100)
GLUCOSE, POC: 167 mg/dL — ABNORMAL HIGH (ref 60–100)

## 2020-07-07 LAB — PHOSPHORUS: PHOSPHORUS: 2.5 mg/dL (ref 2.3–4.0)

## 2020-07-07 LAB — MAGNESIUM: MAGNESIUM: 2.1 mg/dL (ref 1.8–2.6)

## 2020-07-07 NOTE — Care Management Notes (Signed)
07/07/20 1100   Assessment Detail   Assessment Type Continued Assessment   Date of Care Management Update 07/07/20   Medicare Intent to Discharge Documentation   Discharge IMM give to: Patient Representative  [name])   Discharge IMM Letter Given Date 07/07/20   Discharge IMM Letter Given Time 1000   Discharge Patient Representative: [name]   Social Work Plan   Discharge Planning Status discharge plan complete   Projected Discharge Date 07/07/20   Anticipated Discharge Disposition Home with [place] with [facility]   Patient/Family in [place] with Plan yes   Intent to Discharge   Family/ Person Notified Yes   Transportation arranged for Other (comment)   Contact Information   Permission Granted to Share Info With   (12:30)     CM Update:  Referral sent via Allscripts to [place] for [place].  Referral for home health services sent to [facility] via Allscripts.  Pt is scheduled for [facility] 07/09/20.  Information placed on AVS.    CM contacted pt brother [name] & reviewed the DC IMM with the him & provided the number for [facility] @ [phone] and our Hospital ID Number of [id number].  Should they call [name] they will need the patient Medicare ID number, please refer to their Medicare Cards.  Copy was placed with pt documents in room 626.  Original was placed in bin for AA Murray Hodgkins to scan into [employer].    CM reviewed DC plan with [employer] indicated he was already at the home he & his sister share.  He was going to be exchanging cars with his sister as it is easier for Marisol get in & out of.  [name] indicated a convenient time for him to arrive to pick up his sister will be around 12:30pm.  He will be here on 07/08/20 with clothing to get her prepared for DC.      Cardiology had recommended a 30 day monitor at DC due to pt bradycardia.  Pt primary nurse [name] will follow this up.  No other CM needs.  DC plan complete.

## 2020-07-07 NOTE — Care Plan (Signed)
[facility] Plan Note      Patient remains pleasantly confused, pt answers most questions with her birthdate.   Pt ambulaing in hallway and to restroom with assist of 1.  Fall precautions in place due to patients altered mentation and impulsive behaviors.   Frequent reorientation provided, time allowed for processing.   Vitals remain stable, pt afebrile, denies having any pain or discomfort.    Call bell and frequently used items within reach.     Dartha Lodge, RN    Problem: Diabetes Comorbidity  Goal: Blood Glucose Level Within Targeted Range  Outcome: Ongoing (see interventions/notes)  Intervention: Monitor and Manage Glycemia  Flowsheets (Taken 07/07/2020 1559)  Glycemic Management: blood glucose monitored     Problem: Hypertension Comorbidity  Goal: Blood Pressure in Desired Range  Outcome: Ongoing (see interventions/notes)  Intervention: Maintain Blood Pressure Management  Flowsheets (Taken 07/07/2020 1559)  Syncope Management: position changed slowly     Problem: Cognitive Impairment  Goal: Optimal Functional Independence  Outcome: Ongoing (see interventions/notes)  Intervention: Optimize Cognitive Function  Flowsheets (Taken 07/07/2020 1559)  Sensory Stimulation Regulation: quiet environment promoted  Reorientation Measures: clock in view  Environment Familiarity/Consistency: daily routine followed     Problem: Skin Injury Risk Increased  Goal: Skin Health and Integrity  Outcome: Ongoing (see interventions/notes)  Intervention: Promote and Optimize Oral Intake  Flowsheets (Taken 07/07/2020 1559)  Oral Nutrition Promotion: social interaction promoted

## 2020-07-07 NOTE — Nurses Notes (Signed)
Medications and plan of care discussed with patient who verbalized understanding. Patient is alert to self and very pleasant. Neuro checks unchanged. Ambulates with 1 assist to the bathroom. Appeared to be sleeping between care. Frequent use articles and call bell within reach. Bed alarm is turned on.

## 2020-07-07 NOTE — Care Plan (Signed)
CM Update:  Referral sent via Allscripts to [place] for [place].  Referral for home health services sent to [facility] via Allscripts.  Pt is scheduled for [facility] 07/09/20.  Information placed on AVS.    CM contacted pt brother [name] & reviewed the DC IMM with the him & provided the number for [facility] @ [phone] and our Hospital ID Number of [id number].  Should they call [name] they will need the patient Medicare ID number, please refer to their Medicare Cards.  Copy was placed with pt documents in room 626.  Original was placed in bin for AA Murray Hodgkins to scan into [employer].    CM reviewed DC plan with [employer] indciated he was already at the home he & his sister share.  He was going to be exchanging cars with his sister as it is easier for [place] get in & out of.  [name] indicated a convenient time for him to arrive to pick up his sister will be around 12:30pm.  He will be here on 07/08/20 with clothing to get her prepared for DC.      Cardiology had recommended a 30 day monitor at DC due to pt bradycardia.  Pt primary nurse [name] will follow this up.  No other CM needs.  DC plan complete.

## 2020-07-07 NOTE — Discharge Instructions (Signed)
Your walker is from [employer].  They can be reached at [phone].    Home Health services have been arranged with [facility].  A nurse will contact you to arrange a convenient time for the first visit scheduled for Thursday September 30th.  You can reach them at [phone].

## 2020-07-07 NOTE — OT Treatment (Signed)
[facility]  Inpatient OT Progress Note    Patient Name: Carrie Escobar  Date of Birth: 12-25-1945  Height: 165.1 cm (5\' 5" )  Weight: 68 kg (150 lb)  Room/Bed: 626/A  Payor: [place] MEDICARE / Plan: [place] MEDICARE ADVANTAGE PPO / Product Type: PPO /     Encounter Date: 06/27/2020 11:35 AM  Inpatient Admission Date: 06/27/2020    Admitting Diagnosis:  Brain mass [G93.89]    Subjective/Objective:   S: Patient Stated, "I'll do whatever you want!" Patient was agreeable to work with OT on UE exercises.    O: Treatment Time:   18 Minutes        Treatment Charge:  Therapeutic Exercise x 1              Pt. performed UE exercises while supine in bed.    Type of Exercise:  Dowel Exercises (Weighted Resistance)    General Strengthening of:  Bilateral Upper Extremities    Number of Sets: 2    Number of Repetitions: 10    Weighted Resistance:  1 Pounds    Description of Exercises:  Pt. participated in BUE exercises using 1# weight with dowel.  Pt. performed 2x10 of the following exercises:  elbow flexion/extension, shoulder flexion/extension, shoulder horizontal ab/adduction, push-outs, wrist flexion/extension.                                                   Assessment:   Patient tolerated exercises fairly well. She required consistant cues/demonstrations and assistance to count repetitions for dowel exercises due to cognition. She would benefit from continued OT to build activity tolerance and safety with ADL.      Plan:     Continue to follow patient as appropriate according to established plan of care, progressing as   tolerated.     OT Recommendations for Nursing:  No new recommendations  OT Recommendations for Patient/Family:  No new recommendations    The risks/benefits of therapy have been discussed with the patient and he/she is in agreement with the established plan of care.     Therapist :     Anda Kraft, COTA      Total Treatment Time:  18 minutes    Time may include review of  chart notes, obtaining patient's functional history from patient/family/medical staff/case management/ancillary personnel, collaboration on findings and treatment options (with the above mentioned individuals), re-assessment, and acute care rehabilitation.

## 2020-07-07 NOTE — Care Management Notes (Signed)
Verified for Peggy, RN liaision with [place] pt is not Covid (+), as she is going to be making a bedside visit.

## 2020-07-07 NOTE — Care Plan (Signed)
CM Update:  Referral sent via Allscripts to [place] for [place].  Referral for home health services sent to [facility] via Allscripts.  Pt is scheduled for [facility] 07/09/20.  Information placed on AVS.    CM contacted pt brother Carrie Escobar & reviewed the DC IMM with the him & provided the number for [facility] @ [phone] and our Hospital ID Number of [id number].  Should they call [name] they will need the patient Medicare ID number, please refer to their Medicare Cards.  Copy was placed with pt documents in room 626.  Original was placed in bin for AA Murray Hodgkins to scan into [employer].    CM reviewed DC plan with Carrie Escobar indciated he was already at the home he & his sister share.  He was going to be exchanging cars with his sister as it is easier for [name] get in & out of.  Carrie Escobar indicated a convenient time for him to arrive to pick up his sister will be around 12:30pm.  He will be here on 07/08/20 with clothing to get her prepared for DC.      Cardiology had recommended a 30 day monitor at DC due to pt bradycardia.  Pt primary nurse [name] will follow this up.  No other CM needs.  DC plan complete.

## 2020-07-07 NOTE — Care Plan (Signed)
Problem: Adult Inpatient Plan of Care  Goal: Plan of Care Review  Outcome: Ongoing (see interventions/notes)  Goal: Patient-Specific Goal (Individualized)  Outcome: Ongoing (see interventions/notes)  Goal: Absence of Hospital-Acquired Illness or Injury  Outcome: Ongoing (see interventions/notes)  Goal: Optimal Comfort and Wellbeing  Outcome: Ongoing (see interventions/notes)  Goal: Rounds/Family Conference  Outcome: Ongoing (see interventions/notes)     Problem: Seizure, Active Management  Goal: Absence of Seizure/Seizure-Related Injury  Outcome: Ongoing (see interventions/notes)     Problem: Diabetes Comorbidity  Goal: Blood Glucose Level Within Targeted Range  Outcome: Ongoing (see interventions/notes)     Problem: Hypertension Comorbidity  Goal: Blood Pressure in Desired Range  Outcome: Ongoing (see interventions/notes)     Problem: Cognitive Impairment  Goal: Optimal Functional Independence  Outcome: Ongoing (see interventions/notes)     Problem: Coping Ineffective (Oncology Care)  Goal: Effective Coping  Outcome: Ongoing (see interventions/notes)     Problem: Fatigue (Oncology Care)  Goal: Improved Activity Tolerance  Outcome: Ongoing (see interventions/notes)     Problem: Oral Intake Altered (Oncology Care)  Goal: Optimal Oral Intake  Outcome: Ongoing (see interventions/notes)     Problem: Acute Neurologic Deterioration  Goal: Absence of Acute Neurologic Symptoms  Outcome: Ongoing (see interventions/notes)     Problem: Skin Injury Risk Increased  Goal: Skin Health and Integrity  Outcome: Ongoing (see interventions/notes)

## 2020-07-07 NOTE — Progress Notes (Addendum)
[facility]  [place],  [id number]    INPATIENT PROGRESS NOTE      Lawana, Hartzell  Date of Admission:  06/27/2020  Date of Birth:  12-30-1945  Date of Service:  07/07/2020         Assessment/ Plan:       Intracranial masses:  Multi focal. Concerning for glioblastoma v Lymphoma v Mets. Associated with weakness, memory impairment and lethargy.   - pt remains confused, disoriented. No communication possible with the patient. Repeat CT of the head 9/24 does not show any new changes.   -CT chest/Abd/Plv showing a thyroid mass. [place] of thyroid showing Left thyroid nodule. FNAC recommended. Oncology, Dr. Holley Dexter would like to consider thyroid biopsy as outpatient.  - s/p off ASA x 5 days.  Follow-up with Neurosurgery about when to restart aspirin.  - Continue on prophylactic Keppra IV. Continue Decadron. Continue  Neuro checks.  Appreciate recommendation from Neurology, Neurosurgery, and Oncology. Falls, seizure, and aspiration precautions. Supportive care as needed.    Abnormal EEG result: Delta wave noted concerning for seizure potential. Keppra to continue per Dr Reesa Chew. Seizure precautions. Fall precaution. IV ativan for breakthrough seizure. Supportive care.     Memory impairment/Confusion: Subacute. Ongoing for several weeks. Worsening.  Likely due to above diagnoses. Treat as outlined above. Further evaluation diagnosis as outlined above.    Lethargy/acute metabolic encephalopathy: Subacute.  May be emanating from above diagnosis.  Treat as outlined above. PT OT recommends rehab/SNF. Fall precautions.    Hypertension, uncontrolled.: Benign.  Norvasc held,  Continue with lisinopril 10 mg po daily.     Diabetes mellitus: Type 2. Patient on Metformin. Will continue same. Sliding scale insulin coverage as needed. Fingersticks.      Bradycardia  - HR significantly better.  - seen by cardiology. May be having OSA, needs outpatient sleep study. Upon DC, she would need outpatient 30  cardiac event monitor and outpatient follow up with cardiology in 2 weeks.   - EKG reviewed by myself, showing NSR, sinus bradycardia, no acute ST/T wave changes.   - continue to hold timolol eye drops and Norvasc.  - continue with cardiac monitoring.    Leukocytosis  - likely sec to steriod, worse today.   - no evidence of active infection at this time.  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 I spoke with her brother, mr. [name] 07/06/2020. He informed me that family would unable to take her home before Wednesday 9/29- Informed CM, Ms. Bonnita Nasuti. Likely DC tomorrow with HH. See above for outpatient cardiac work up.          Chief Complaint:  Intracranial Masses, sleepiness, forgetfulness, fatigue.      Subjective:  Confused, disoriented. No communication possible.    D/w RN.      12 point ROS have been negative except as mentioned          Today's Physical Exam:  BP 137/75    Pulse 56    Temp 36.3 C (97.3 F)    Resp 20    Ht 1.651 m (5\' 5" )    Wt 68 kg (  150 lb)    SpO2 97%    BMI 24.96 kg/m       General: alert, awake. Confused, disoriented. No acute distress.   Eyes: Pupils equal and round, reactive to light and accomodation.   HENT:Head atraumatic and normocephalic   Neck: No JVD or thyromegaly or lymphadenopathy   Lungs: Clear to auscultation bilaterally.   Cardiovascular: HR improved. Regular rhythm, S1, S2 normal, no murmur,   Abdomen: Soft, non-tender, Bowel sounds normal, No hepatosplenomegaly   Extremities: extremities normal, atraumatic, no cyanosis or edema   Skin: Skin warm and dry   Neurologic:   Confused/disoriented.  Lymphatics: No lymphadenopathy   Psychiatric:confused, disoriented.     Current Medications:  [Held by provider] amLODIPine (NORVASC) tablet, 10 mg, Oral,  Daily  dexAMETHasone (DECADRON) tablet, 4 mg, Oral, Q6H  fluticasone (FLONASE) 50 mcg per spray nasal spray, 1 Spray, Each Nostril, Daily  levETIRAcetam (KEPPRA) tablet, 1,000 mg, Oral, 2x/day  lisinopril (PRINIVIL) tablet, 10 mg, Oral, Daily  LORazepam (ATIVAN) 2 mg/mL injection, 2 mg, Intravenous, Q4H PRN  metFORMIN (GLUCOPHAGE XR) extended release tablet, 500 mg, Oral, Daily  NS 250 mL flush bag, , Intravenous, Q1H PRN  NS flush syringe, 10 mL, Intravenous, Q8HRS  NS flush syringe, 10 mL, Intravenous, Q1H PRN  SSIP insulin lispro (HUMALOG) 100 units/mL SubQ pen, 1-12 Units, Subcutaneous, 4x/day AC  [Held by provider] timolol (TIMOPTIC) 0.25% ophthalmic solution, 1 Drop, Right Eye, 2x/day  traMADol (ULTRAM) tablet, 50 mg, Oral, Q6H PRN          I/O:  I/O last 24 hours:      Intake/Output Summary (Last 24 hours) at 07/07/2020 1627  Last data filed at 07/07/2020 1300  Gross per 24 hour   Intake 600 ml   Output --   Net 600 ml     I/O current shift:  09/28 0700 - 09/28 1859  In: 360 [P.O.:360]  Out: -       Laboratory Results:    Reviewed:   Results for orders placed or performed during the hospital encounter of 06/27/20 (from the past 24 hour(s))   POC FINGERSTICK GLUCOSE - [facility] (RESULTS)   Result Value Ref Range    GLUCOSE, POC 144 (H) 60 - 100 mg/dl   POC FINGERSTICK GLUCOSE - [facility] (RESULTS)   Result Value Ref Range    GLUCOSE, POC 188 (H) 60 - 100 mg/dl   MAGNESIUM   Result Value Ref Range    MAGNESIUM 2.1 1.8 - 2.6 mg/dL   PHOSPHORUS   Result Value Ref Range    PHOSPHORUS 2.5 2.3 - 4.0 mg/dL   CBC W/AUTO DIFF   Result Value Ref Range    WBC 16.8 (H) 4.0 - 11.0 x103/uL    RBC 4.30 4.00 - 5.10 x106/uL    HGB 12.5 12.0 - 15.5 g/dL    HCT 36.3 36.0 - 45.0 %    MCV 84.5 82.0 - 97.0 fL    MCH 29.2 27.5 - 33.2 pg    MCHC 34.5 32.0 - 36.0 g/dL    RDW 14.4 11.0 - 16.0 %    PLATELETS 116 (L) 150 - 450 x103/uL    MPV 9.5 7.4 - 10.5 fL    NEUTROPHIL % 89 (H) 43 - 76 %    LYMPHOCYTE % 6 (L) 15 - 43 %    MONOCYTE % 5 5  - 12 %    EOSINOPHIL % 0 0 - 5 %    BASOPHIL % 0 0 -  3 %    NEUTROPHIL # 15.00 (H) 1.50 - 6.50 x103/uL    LYMPHOCYTE # 1.00 1.00 - 4.80 x103/uL    MONOCYTE # 0.80 0.20 - 0.90 x103/uL    EOSINOPHIL # 0.00 0.00 - 0.50 x103/uL    BASOPHIL # 0.00 0.00 - 0.10 X517/GY   BASIC METABOLIC PANEL   Result Value Ref Range    SODIUM 129 (L) 136 - 145 mmol/L    POTASSIUM 3.8 3.5 - 5.1 mmol/L    CHLORIDE 101 96 - 111 mmol/L    CO2 TOTAL 21 (L) 23 - 31 mmol/L    ANION GAP 7 4 - 13 mmol/L    CALCIUM 8.6 (L) 8.8 - 10.2 mg/dL    GLUCOSE 134 (H) 65 - 125 mg/dL    BUN 18 8 - 25 mg/dL    CREATININE 0.70 0.60 - 1.05 mg/dL    BUN/CREA RATIO 26 (H) 6 - 22    ESTIMATED GFR >90 >=60 mL/min/BSA   POC FINGERSTICK GLUCOSE - [facility] (RESULTS)   Result Value Ref Range    GLUCOSE, POC 141 (H) 60 - 100 mg/dl   POC FINGERSTICK GLUCOSE - [facility] (RESULTS)   Result Value Ref Range    GLUCOSE, POC 183 (H) 60 - 100 mg/dl   POC FINGERSTICK GLUCOSE - [facility] (RESULTS)   Result Value Ref Range    GLUCOSE, POC 142 (H) 60 - 100 mg/dl     Ordered:    Lab orders   No lab orders         Radiological Studies:  Reviewed:      Ordered:  CT BRAIN WO IV CONTRAST  XR AP MOBILE CHEST  MRI BRAIN W/WO CONTRAST  CT CHEST ABDOMEN PELVIS W IV CONTRAST  MRI BRAIN STEALTH W/WO CONTRAST  US THYROID  CT BRAIN STEALTH W CONTRAST  CT BRAIN WO IV CONTRAST  CT BRAIN WO IV CONTRAST    Problem List:  Active Hospital Problems   (*Primary Problem)    Diagnosis    *Brain mass    Memory impairment    Acute metabolic encephalopathy         Gillis Santa, MD, 07/07/2020,  16:27

## 2020-07-07 NOTE — PT Treatment (Signed)
[facility]  Physical Therapy Progress Note      Subjective:  Pt sitting up in chair. To be d/c to home would need 24/7 care.       Areas of gait training assessed:  15 min      Sit - stand:  Verbal directions multiple      Balance while sitting:  Able to maintain position     Balance while standing: Unsteady - no physical help      Ambulation surface:  Level, In patient's room and In hallway     Ambulation distance:  100 feet ( 50 feet out and back )      Ambulation ability:  Contact guard assist, Minimum assist and 1 person       Assistive devices:  Hand held assist.      Ambulation tolerance:  Fair     Comments: pt had no LOB . Verbal directions . Would not be safe to be up on her own.       Bed alarm reset:  Yes    Assessment:  Multiple verbal directions for pt to do any task. She is very pleasant and cooperative. Hand held assist, no LOB. Will need 24/7 care.     Plan:  Continue PT per plan of care    PT Recommendations for Nursing:  Up for meals and to bathroom   PT Recommendations for Patient/Family::  Not to get up without help.        Noralee Chars, PTA

## 2020-07-07 NOTE — Care Plan (Signed)
Assessment:   Patient tolerated exercises fairly well. She required consistant cues/demonstrations and assistance to count repetitions for dowel exercises due to cognition. She would benefit from continued OT to build activity tolerance and safety with ADL.        Plan:      Continue to follow patient as appropriate according to established plan of care, progressing as   tolerated.      OT Recommendations for Nursing:  No new recommendations  OT Recommendations for Patient/Family:  No new recommendations     The risks/benefits of therapy have been discussed with the patient and he/she is in agreement with the established plan of care.      Therapist :      Anda Kraft, COTA

## 2020-07-08 DIAGNOSIS — I493 Ventricular premature depolarization: Secondary | ICD-10-CM

## 2020-07-08 LAB — POC FINGERSTICK GLUCOSE - BMC/JMC (RESULTS): GLUCOSE, POC: 129 mg/dL — ABNORMAL HIGH (ref 60–100)

## 2020-07-08 LAB — BASIC METABOLIC PANEL
ANION GAP: 11 mmol/L (ref 4–13)
BUN/CREA RATIO: 23 — ABNORMAL HIGH (ref 6–22)
BUN: 17 mg/dL (ref 8–25)
CALCIUM: 8.9 mg/dL (ref 8.8–10.2)
CHLORIDE: 109 mmol/L (ref 96–111)
CO2 TOTAL: 17 mmol/L — ABNORMAL LOW (ref 23–31)
CREATININE: 0.73 mg/dL (ref 0.60–1.05)
ESTIMATED GFR: >90 mL/min/BSA (ref >=60)
GLUCOSE: 126 mg/dL — ABNORMAL HIGH (ref 65–125)
POTASSIUM: 4.9 mmol/L (ref 3.5–5.1)
SODIUM: 137 mmol/L (ref 136–145)

## 2020-07-08 MED ORDER — DEXAMETHASONE 2 MG TABLET
ORAL_TABLET | ORAL | 0 refills | Status: DC
Start: 2020-07-08 — End: 2020-08-11

## 2020-07-08 MED ORDER — TRAMADOL 50 MG TABLET
50.0000 mg | ORAL_TABLET | Freq: Four times a day (QID) | ORAL | 0 refills | Status: DC | PRN
Start: 2020-07-08 — End: 2020-09-25

## 2020-07-08 MED ORDER — LEVETIRACETAM 1,000 MG TABLET
1000.0000 mg | ORAL_TABLET | Freq: Two times a day (BID) | ORAL | 0 refills | Status: DC
Start: 2020-07-08 — End: 2020-08-02

## 2020-07-08 NOTE — Care Plan (Signed)
Plan for DC today to home with [facility] care.  Brother [name] is to arrive around noon today for transport to home.  Cardiology recommended 30 day cardiac monitoring at DC; order needs entered for this to occur.  Pt brother will need to included in training of apparatus.  CM sent secure chat message to pt care team as attending changed this am.   [facility] is scheduled to see pt 07/09/20 for [facility].

## 2020-07-08 NOTE — Care Management Notes (Signed)
Referral Information  ++++++ Placed Provider #1 ++++++  Case Manager: Helen Weaver  Provider Type: Nursing Home/SNF  Address:  ,    Contact:    Fax:   Fax:

## 2020-07-08 NOTE — Discharge Summary (Signed)
[facility]    DISCHARGE SUMMARY      PATIENT NAME:  Carrie Escobar, Carrie Escobar  MRN:  [id number]  DOB:  [birth year]    ENCOUNTER START DATE:  06/27/2020  INPATIENT ADMISSION DATE: 06/27/2020  DISCHARGE DATE:  07/08/2020    ATTENDING PHYSICIAN: Peter Garter, MD  PRIMARY CARE PHYSICIAN: Kayleen Memos, MD     ADMISSION DIAGNOSIS: Brain mass  Chief Complaint   Patient presents with    Weakness       DISCHARGE DIAGNOSIS:     Principal Problem:  Brain mass    Active Hospital Problems    Diagnosis Date Noted    Principle Problem: Brain mass [G93.89] 06/27/2020    Memory impairment [R41.3] 56/81/2751    Acute metabolic encephalopathy [Z00.17] 06/27/2020      Resolved Hospital Problems   No resolved problems to display.     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      Patient Active Problem List   Diagnosis Code    Shoulder pain, right M25.511    Lipoma of shoulder D17.20    Rotator cuff syndrome of right shoulder M75.101    Senile nuclear sclerosis H25.10    Routine general medical examination at a health care facility Z00.00    Dependent edema R60.9    Atypical chest pain R07.89    Trigger finger of right hand M65.30    Brain mass G93.89    Memory impairment R41.3    Acute metabolic encephalopathy C94.49       Allergies   Allergen Reactions    Amoxicillin Rash            DISCHARGE MEDICATIONS:     Current Discharge Medication List      START taking these medications.      Details   dexAMETHasone 2 mg Tablet  Commonly known as: DECADRON   Take 2 tabs every 8 hours for 5 days; then 1 tab every 8 hours for 5 days; then 1 tab every 12 hours  for 5 days; then 1 tab every day for 5 days and stop.  Qty: 60 Tablet  Refills: 0     levETIRAcetam 1,000 mg Tablet  Commonly known as: KEPPRA   1,000 mg, Oral, 2 TIMES DAILY  Qty: 60 Tablet  Refills: 0     traMADoL 50 mg Tablet  Commonly known as: ULTRAM   50 mg, Oral, EVERY 6 HOURS PRN  Qty: 10 Tablet  Refills: 0        CONTINUE these medications - NO CHANGES were made during your visit.      Details   amLODIPine 10 mg Tablet  Commonly known as: NORVASC   TAKE 1 TABLET BY MOUTH EVERY DAY  Qty: 90 Tablet  Refills: 1     aspirin 81 mg Tablet, Delayed Release (E.C.)  Commonly known as: ECOTRIN   81 mg, Oral, DAILY  Refills: 0     bisoprolol-hydroCHLOROthiazide 10-6.25 mg Tablet  Commonly known as: ZIAC   1 Tablet, Oral, EVERY MORNING  Qty: 90 Tablet  Refills: 1     calcium  citrate-vitamin D3 200 mg-6.25 mcg (250 unit) Tablet  Commonly known as: CITRACAL   1 Tablet, DAILY  Refills: 0     Flaxseed Oil 1,000 mg Capsule   Oral  Refills: 0     fluticasone propionate 50 mcg/actuation Spray, Suspension  Commonly known as: FLONASE   1 Spray, Each Nostril, 2 TIMES DAILY PRN  Qty: 1 Bottle  Refills: 0     metFORMIN 500 mg Tablet Sustained Release 24 hr  Commonly known as: GLUCOPHAGE XR   500 mg, Oral, DAILY  Qty: 90 Tablet  Refills: 1     multivitamin Tablet   1 Tablet, DAILY  Refills: 0     timoloL maleate 0.25 % Drops  Commonly known as: TIMOPTIC   1 Drop, Both Eyes, 2 TIMES DAILY  Refills: 0            DISCHARGE INSTRUCTIONS:      Refer to [facility]   Referral Type: Home Health   Requested Specialty: Josephine    Follow up with PCP. Follow up with Oncology. Follow up wit neurosurgery.     CARDIAC EVENT MONITOR    If you leave the hospital prior to having your monitor put on, it is your responsibility to call the scheduling department at [phone] as soon as possible and schedule an appointment  with EKG.            Reason for Exam z-OTHER (Specify in Comments)      DME - WALKER Front Wheeled, Folding    Please note - If patient is 300 lbs or greater please order bariatric or heavy duty items.     Ht 165.1 cm    Wt 69.7 kg    Current Attending: Dr. Gillis Santa    Medical Condition or Diagnosis which is primary reason for equipment: Brain Mass, s/p biopsy, Ambulatory dysfunction    Patient has mobility limits that significantly impairs ability to participate in one or more mobility related ADL's (MRADL's): Yes    Moblity Limitations: Pt at heightened risk of injury r/t attempts to fulfill MRADL's & can safely use walker which resolves issue    Walker Type: Front Wheeled    Walker Type: Folding    Freedom of Choice: I have informed patient of their freedom of choice with respect to DME providers    Estimated Length of Need (in months; 99 mo.= lifetime) 99       Follow-up Information     Kayleen Memos, MD In 1 week.    Specialty: GERIATRIC MEDICINE  Contact information:  [address]  [address]  [place] [address]  [phone]             Mertie Moores, MD In 1 week.    Specialty: HEMATOLOGY/ONCOLOGY  Contact information:  [address]  [address] [place] [address]  [phone]             Particia Jasper, MD In 1 week.    Specialty: NEUROSURGERY  Contact information:  [address]  [address]  [place] [address]  [phone]                         REASON FOR HOSPITALIZATION AND HOSPITAL COURSE:  This is a 74 y.o., female   admitted for  confusion, lethargy. Noted to have multifocal intracranial masses on imaging. Had Brain biopsy; pathology reports still pending. Due to her confusion, she requires 24/7 care. [facility] arranged. Family including brother and distress to provide supportive e home care. On prophylactic Keppra. On Tapering decadron dosing. Will follow up with PCP, Oncology and Neurosurgery.     SIGNIFICANT PHYSICAL FINDINGS:   BP 122/63    Pulse 49    Temp 36.4 C (97.5 F)    Resp  14    Ht 1.651 m (_0 )    Wt 68 kg (150 lb)    SpO2 97%    BMI 24.96 kg/m     General: Confused, No acute distress.   Eyes: Pupils equal and round, reactive to light and accomodation.   HENT:Head atraumatic and normocephalic   Neck: No JVD or thyromegaly or lymphadenopathy   Lungs: Clear to auscultation bilaterally.   Cardiovascular: regular rate and rhythm, S1, S2 normal, no murmur,   Abdomen: Soft, non-tender, Bowel sounds normal, No hepatosplenomegaly   Extremities: extremities normal, atraumatic, no cyanosis or edema   Skin: Skin warm and dry   Neurologic: Confused   Lymphatics: No lymphadenopathy   Psychiatric: Normal affect and behavior.    SIGNIFICANT LAB:     SIGNIFICANT RADIOLOGY:   Results for orders placed or performed during the hospital encounter of 06/27/20   CT BRAIN WO IV CONTRAST     Status: Abnormal    Narrative    [name]  Female, 74 years old.    CT BRAIN WO IV CONTRAST performed on 06/27/2020 12:21 PM.    REASON FOR EXAM:  AMS,    RADIATION DOSE: 1221.00 mGy.cm    TECHNIQUE: Multiplanar nonenhanced images of the brain    COMPARISON: None available    FINDINGS:  Area of hypoattenuation left temporal and parietal lobes  predominantly involving the white matter. There may also be abnormal  hypoattenuation involving the splenium of the corpus callosum. No  associated hemorrhage. Mild effacement of the trigone of the left lateral  ventricle without significant midline shift. No hydrocephalus. No  extra-axial fluid collection.      Impression    Area of hypoattenuation in the left temporal and parietal lobes  predominantly involving the white matter as described above. This is  concerning for vasogenic edema secondary to possible mass lesion in this  area. An evolving infarct is unlikely. Recommend MRI for further  evaluation.      Radiologist location ID: [id number]     XR AP MOBILE CHEST (If patient condition warrants)     Status: None    Narrative    RADIOLOGIST: Lars Masson,  MD    EXAMINATION: XR AP MOBILE CHEST     EXAM DATE/TIME: 06/27/2020 12:46 PM    CLINICAL INDICATION: Chest pain    COMPARISON: None.    FINDINGS:     Cardiovascular:   The cardiac silhouette is within normal limits.    Lungs:   There are no infiltrates or pulmonary edema.    Pleural spaces:   There are no pleural effusions on either side.    Chest wall:   No acute findings.      Impression    No acute radiographic findings.        Radiologist location ID: [id number]     MRI BRAIN W/WO CONTRAST     Status: None    Narrative    [name]  Female, 74 years old.    MRI  BRAIN W/WO CONTRAST performed on 06/27/2020 3:30 PM.    REASON FOR EXAM:  CT shows the suspicious for mass lesion in the left  temporal and parietal lobes      INTRAVENOUS CONTRAST: 14 ml's of Gadavist  CREATININE/GFR:    TECHNIQUE: Multiplanar multisequence MRI of the brain with and without  contrast    COMPARISON: Head CT from the same day    FINDINGS: Enhancing mass with central necrosis in the left parietal and  left temporal lobe including the mesial temporal lobe measuring 4.2 cm AP  dimension by 2.9 cm transverse dimension by 2.8 cm craniocaudal dimension.  The mass involves the deep and periventricular white matter and extends  along the ependymal lining of the left lateral ventricle involving the  trigone, temporal and occipital horn. Hyperperfusion is noted to the  peripheral enhancing portions of the mass. There is surrounding masslike  nonenhancing signal abnormality in the left parietal, temporal and  occipital lobes as well as extending across the splenium of the corpus  callosum. An additional lesion just posterior to the above-mentioned lesion  connected by the abnormal white matter signal to the larger mass measuring  1.1 cm x 0.6 cm is seen. There is also a separate left occipital mass  measuring 9 mm x 10 mm, which is also connected to the above-mentioned  lesions by T2/FLAIR signal abnormality. Some effacement of the trigone  and  temporal horns of the left lateral ventricle without significant midline  shift. No associated hemorrhage.    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    No acute infarct.         Impression    Multiple enhancing masses in the left parietal, temporal and occipital  lobes which are connected by masslike T2/FLAIR signal abnormality which  also extends into the splenium of the corpus callosum. 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 The largest mass extends along the  ependymal lining of the left lateral ventricle.    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      Radiologist location ID: [id number]     CT CHEST ABDOMEN PELVIS W IV CONTRAST     Status: None    Narrative    RADIOLOGIST: Austin Miles, MD    EXAMINATION: CT CHEST ABDOMEN PELVIS W IV CONTRAST     CLINICAL INDICATION: brain masses on MRI - primary source of metastasis?    TECHNIQUE: Standard technique was utilized. Axial plane images were  obtained. Multiplanar image reconstruction was performed.    COMPARISON: CT scan from April 2017      FINDINGS:  10 mm nodule in the left thyroid lobe. There is a 6 mm peripheral, density  in the left lower lobe, with adjacent linear density. This appears  unchanged since 2017. No pleural effusions. No mediastinal or hilar  adenopathy. No axillary adenopathy.    The liver, gallbladder, spleen, pancreas, adrenal glands are normal. There  is a cyst in the right kidney. Bladder is normal. There is mild diffuse  thickening of the transverse, descending, sigmoid colon and rectum. There  is no ascites or adenopathy. Mild degenerative changes of the lower lumbar  spine. No aggressive osseous lesions are seen.      Impression    1.10 mm nodule in the left thyroid lobe. Recommend thyroid ultrasound  and appropriate follow-up.  2.There is a 6  mm pleural-based peripheral density in the left  lower  lobe of the lung, with adjacent linear density. This appears unchanged  since 2017 and likely represents scarring.  3.Mild diffuse thickening of the colon and rectum, suspicious for  colitis.        MIPS PERFORMANCE METRICS:  1. Dose reduction technique used. Automatic exposure control (AEC) was  applied.  2. Count of high-dose radiation studies (CT and cardiac and NM) last 12  months:  1.  3. DLP: 1086.10 mGy.cm  4. CONTRAST: 80 ml of                      Radiologist location ID: [id number]     MRI BRAIN STEALTH W/WO CONTRAST     Status: None    Narrative    [name]  Female, 74 years old.    MRI BRAIN STEALTH W/WO CONTRAST performed on 06/28/2020 3:51 PM.    REASON FOR EXAM:  Pre-op planning      INTRAVENOUS CONTRAST: 13 ml's of Prohance  CREATININE/GFR:    TECHNIQUE: Multiplanar multisequence MRI of the brain with and without  contrast with stealth protocol    COMPARISON: Brain MRI June 27, 2020    FINDINGS:     FINDINGS: Enhancing mass with central necrosis in the left parietal and  left temporal lobe including the mesial temporal lobe measuring 4.2 cm AP  dimension by 2.9 cm transverse dimension by 2.8 cm craniocaudal dimension.  The mass involves the deep and periventricular white matter and extends  along the ependymal lining of the left lateral ventricle involving the  trigone, temporal and occipital horn. Hyperperfusion is noted to the  peripheral enhancing portions of the mass. There is surrounding masslike  nonenhancing signal abnormality best seen on the prior MRI on FLAIR/T2  weighted images in the left parietal, temporal and  occipital lobes as well as extending across the splenium of the corpus  callosum. An additional lesion just posterior to the above-mentioned lesion  connected by the abnormal white matter signal to the larger mass measuring  1.1 cm x 0.6 cm is seen. There is also a separate left occipital mass  measuring 9 mm x 10 mm, which is also connected to the  above-mentioned  lesions by T2/FLAIR signal abnormality is seen on the prior study. Some  effacement of the trigone and  temporal horns of the left lateral ventricle with approximately 1-2 mm  rightward midline shift, unchanged from prior.      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 Lymphoma is  another consideration.      Radiologist location ID: [id number]     US THYROID     Status: None    Narrative    RADIOLOGIST: Dimitri Misailidis, MD    EXAMINATION: US THYROID     EXAM DATE/TIME:  06/29/2020 10:25 AM    CLINICAL INDICATION: left thytriod mass on CT    COMPARISON: None.    FINDINGS:  RIGHT LOBE: Measures 4.8 1.4 x 1.3 cm. The parenchyma is homogenous. No  nodules. There are no suspicious nodules.     LEFT LOBE: Measures 4.5 x 1.8 x 1.7 cm.  The parenchyma is homogenous. There  is a complex nodule of the left thyroid lobe measuring 15 x 12 x 13 mm.  This is a mixed cystic and solid nodule which is hyper to isoechoic and  taller than wider with smooth margins and no calcifications (TI RADS 4).  This is a moderately suspicious nodule and since it measures 15 mm, FNA  biopsy is recommended.    ISTHMUS: thickness 2-3 mm. No nodules.       Impression    1. There is a TI RADS 4 nodule in the left thyroid lobe. It measures 15 x  12 x 13 mm. FNA biopsy is recommended.       Above recommendations are adapted from:  [employer] White Paper on Thyroid Nodule Management,  2017. [employer].    Guidelines:  1. Nodule classification, significance and recommended management  TIRADS 1, Benign. No FNA  TIRADS 2, Not suspicious, No FNA  TIRADS 3, Mildly suspicious,>/=15 ID:POEUMP up;>/=25 mm:FNA  TIRADS 4: Moderately  suspicious,>/=10 NT:IRWERX up;>/= 15 mm:FNA  TIRADS 5: Highly suspicious,>/= 5 VQ:MGQQPY up;>/= 10 mm:FNA    2. Sonographic follow up should be for 5 years.    MIPS Measure #406 Documentation: Compliance met.                  Radiologist location ID: [id number]     CT BRAIN STEALTH W CONTRAST     Status: None    Narrative    [name]  Female, 74 years old.    CT BRAIN STEALTH W CONTRAST performed on 07/01/2020 9:30 AM.    REASON FOR EXAM:  Pre-op    RADIATION DOSE: 1189.4 DLP (mGycm)    CONTRAST: 84 ml's of Isovue 370    COMPARISON: MRI brain 06/28/2020.    FINDINGS:     Redemonstration of large mass centered in the left temporoparietal region.  There is also a smaller focus of vasogenic edema in the left occipital  region. Findings are consistent known multifocal disease. There is  associated mass effect with partial effacement of the left lateral  ventricle and 4 mm left-to-right midline shift, similar to prior study. The  calvarium is intact. The sinuses and mastoids are clear.      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          Radiologist location ID: [id number]     CT BRAIN WO IV CONTRAST     Status: None    Narrative    [name]  Female, 74 years old.    CT BRAIN WO IV CONTRAST performed on 07/02/2020 1:22 PM.    REASON FOR EXAM:  post-op    RADIATION DOSE: 701.30 mGy.cm    COMPARISON: MRI brain 06/27/2020.    FINDINGS:      There are acute postsurgical changes related to biopsy of left temporal  lobe neoplasm. Minimal gas noted within the biopsy site. No change in  extensive vasogenic edema predominantly involving the left temporal lobe.  There is unchanged mass effect with approximately 3 mm right-to-left  midline shift. No discrete parenchymal hemorrhage or evidence of acute  territorial infarction.      Impression    Acute postbiopsy changes related to left temporal lobe  neoplasm.      Radiologist location ID: [id number]     CT BRAIN WO IV CONTRAST     Status: None  Narrative    [name]  Female, 74 years old.    CT BRAIN WO IV CONTRAST performed on 07/03/2020 1:48 PM.    REASON FOR EXAM:  AMS, s/p brain biopsy    RADIATION DOSE: 1141.80 mGycm    TECHNIQUE: Noncontrast MRI of the brain.    COMPARISON: July 02, 2020.    FINDINGS:  There is redemonstration of postoperative changes from left  temporal lobe mass is seen. Small at the biopsy site is unchanged. The  multifocal masses involving the left parietal, temporal and occipital lobes  with connecting white matter lucency are unchanged in appearance given the  differences in technique. There is unchanged surrounding edema. Localized  mass effect and mild dilation of the temporal horn of the left lateral  ventricle is unchanged. No new hemorrhage, gray-white loss or mass effect  is identified.      Impression    Stable appearance of a left hemisphere mass status post biopsy. No new  hemorrhage or gray-white loss is identified. There is unchanged mass effect  associated with the tumor.      Radiologist location ID: [id number]       CONSULTATIONS:   NEUROSURGERY  NEUROLOGY  ONCOLOGY    PROCEDURES PERFORMED:   Brain mass biopsy    INDICATIONS FOR PROCEDURE:  Ms. Kysa Calais is a 74 year old female who presented to the hospital with progressive confusion, memory issues.  MR imaging revealed a large left deep temporal lesion as well as a left occipital lesion.  Discussion was made for biopsy.  Risks and benefits were discussed with the patient and her son, which include but are not limited to hematoma, infection, wound healing problems, CSF leak, nondiagnostic biopsy, seizure, stroke, damage local tissue, need for further surgery.  The patient understood the risks and wished to proceed.    DESCRIPTION OF PROCEDURE:  This 74 year old female was transferred to the operating room where appropriate consents were obtained.   She was placed in supine position on the table, placed under anesthesia and intubated.  The patient then had 3-pin [place] head frame placed.  The head was locked into position.  Stealth neuronavigation was then registered.  The prior entry point and target had been laid out just above the left ear.  This was confirmed with Stealth navigation.  The head was shaved appropriately.  A curvilinear type incision was drawn.  The patient was then prepped and draped in standard surgical fashion.     After which approximately 5 mL of 1% lidocaine with epinephrine was injected into the dermis.  After which a 10 blade knife was used to incise from the epidermis down to the dermis.  Weitlaner retractor was placed.  Bipolar electrocautery was used for adequate hemostasis.  Monopolar was then used to dissect the temporalis fascia and musculature.  Paris elevator was then used to elevate the temporalis muscle.  A cerebellar retractor was then replaced.  Perforator bur hole was used to place an adequate bur hole.  Stealth neuronavigation confirmed our entry point was right in the center of our bur hole.  After which the remainder of the inner table was removed.  Bipolar electrocautery was used for adequate hemostasis, in combination with FloSeal.  After which the dura was coagulated and opened in a cruciate fashion with a 15 blade.  The dural leaves were coagulated.  The cortex was coagulated and cut.  The entry point was reset to the brain surface.  The Vertek  arm had been previously  set up and was lined up for subcentimeter placement within the target.  The initial target was just at the edge of the tumor, where there was the primary area of enhancement.  Biopsy was taken and specimen was sent for frozen pathology and found to be lesional.  Additional superficial as well as deep biopsies within the tumor were taken and also sent for permanent pathology.  The biopsy needle was then removed.  Bipolar electrocautery was used for  adequate hemostasis.  Gelfoam and thrombin were placed in the bur hole.  A bur hole cover was then placed with the Stryker low-lying plating system.  The area was then copiously irrigated with bacitracin laden solution.  Bipolar electrocautery was used to coagulate the musculature.  After which the temporalis fascia muscle was closed with #2 Vicryl sutures.  The deep dermis was closed with 3-0 Vicryl sutures.  The skin was closed with staples.  The skin was then cleaned and dried and a standard Primapore dressing was applied.  The 3-pin Mayfield head frame was removed.  The patient was extubated and transferred to PACU in good stable condition.  The needle, instrument, and patty counts were correct at the end of the case.    Tonita Phoenix, [place], whose?assistance was vital for the success of the operation, especially in regard to suction, retraction, manipulation of neural elements, protection of all soft tissues, assistance with instrumentation implantation, and wound closure. His personal presence was also necessary since no residents were available, therefore, a skilled neurosurgery PA (physician assistant) was required.      COURSE IN HOSPITAL:       Intracranial masses:  Multi focal. Concerning for glioblastoma v Lymphoma v Mets. Associated with weakness, memory impairment and lethargy. S/p Tissues biopsy 07/02/20. Awaiting path reports. CT chest/Abd/Plv only showing a thyroid mass. [place] of thyroid ordered; result noted. FNA recommended. Continue on prophylactic Keppra IV. Continue Decadron. Continue  Neuro checks.  Appreciate recommendation from Neurology, Neurosurgery, and Oncology. Falls, seizure, and aspiration precautions. Supportive care as needed.      Thyroid mass: Needs FNA per radiology. Will discuss with Oncology prior to consulting IR for this.       Abnormal EEG result: Delta wave noted concerning for seizure potential. Keppra to continue per Dr Reesa Chew. Seizure precautions. Fall precaution. IV  ativan for breakthrough seizure. Supportive care.       Memory impairment/Confusion: Subacute. Ongoing for several weeks. Worsening.  Likely due to above diagnoses. Treat as outlined above. Further evaluation diagnosis as outlined above.      Lethargy: Subacute.  May be emanating from above diagnosis.  Treat as outlined above. PT OT evaluation and treatment.  Fall precautions.      Acute metabolic encephalopathy:  Mild to moderate.  Likely due to #1 diagnosis.  Treat as outlined above.  Neuro checks.  Supportive care.        Hypertension: Benign. Shy of goal. Resume oral regimen. Adjust as needed.       Diabetes mellitus: Type 2. Patient on Metformin. Will continue same. Sliding scale insulin coverage as needed. Fingersticks.        Prophylaxis:  SCD      Disposition:  Home      Spent 35 minutes on the DC of this pt.        DOES PATIENT HAVE ADVANCED DIRECTIVES:  No, Information Offered and Given    ADVANCED CARE PLANNING - Not applicable for this patient    CONDITION ON DISCHARGE: VS Stable  and Confused    DISCHARGE DISPOSITION:  Home discharge  and Home Health    Copies sent to Care Team       Relationship Specialty Notifications Start End    Kayleen Memos, MD PCP - General Family Practice-BA  02/26/13     Phone: [phone] Fax: [phone]         [place] [id number]

## 2020-07-08 NOTE — Nurses Notes (Signed)
Pt stable overnight. Appeared to be sleeping between care. Patient stable this shift. Easily excitable, Alert disoriented time, place, situation. Clear speech but mostly word salad when interacting.  Pupils are 88mm, PERLA, brisk. Denies HA, CP, dizziness and nausea. Denies numbness/tingling to BUE and BLE. Palpable radial and pedal pulses 2+. Cap refill <3secs. No pronator drift. Bilateral grips equal and moderate. Push/pull, moderate. Dorsi/ plantar flexion moderate. Able to ambulate with stand by assist without difficulty.

## 2020-07-08 NOTE — Care Management Notes (Signed)
Referral Information  ++++++ Placed Provider #1 ++++++  Case Manager: Helen Weaver  Provider Type: DME  Provider Name: [facility] [place] [facility]  Address:  [address]  [place], [place] [address]  Contact: Wendy Kline    Phone: [phone] x  Fax:   Fax: [phone]

## 2020-07-08 NOTE — Nurses Notes (Addendum)
Discharge order placed along with order for 30 day event monitor.  [name] will arrive around 1045am.  EKG dept will wait to place monitor on patient until brother arrives so that he will be present for education.     Per allied health, walker to be delivered to room prior to discharge home.

## 2020-07-08 NOTE — Ancillary Notes (Addendum)
Cardiac Event Monitor placed, instructions were given to bother patient and her brother.      1120- Walker delivered to patients bedside.  Social worker present to speak with patients brother at his request.

## 2020-07-08 NOTE — Care Management Notes (Signed)
Referral Information  ++++++ Placed Provider #1 ++++++  Case Manager: Helen Weaver  Provider Type: Home Health  Provider Name: [facility] [place] [facility] [place]  Address:  [address]  [place], [place] [address]  Contact: peggy carr    Phone: [phone] x  Fax:   Fax: [phone]

## 2020-07-08 NOTE — Care Management Notes (Signed)
Per request, I met with Mr. Carrie Escobar. We discussed various aspects of in home paid caregivers. At this time, the patient would be above assets for Medicaid so Carrie Escobar would not be an option. Mr. Carrie Escobar did express some interest in Assisted Living as well. At this time, his plan is to pursue some additional support via family (cousin) to provide assistance to the patient at home. Mr. Carrie Escobar did have some questions concerning [facility] and SW agreed to follow up with her CM.

## 2020-07-08 NOTE — Nurses Notes (Signed)
ptient discharged home with brother.  Walker delivered to room prior to discharge.  Event monitor in place and brother provided education on monitor.

## 2020-07-08 NOTE — Care Plan (Signed)
Problem: Adult Inpatient Plan of Care  Goal: Absence of Hospital-Acquired Illness or Injury  Outcome: Ongoing (see interventions/notes)     Problem: Seizure, Active Management  Goal: Absence of Seizure/Seizure-Related Injury  Outcome: Ongoing (see interventions/notes)     Problem: Diabetes Comorbidity  Goal: Blood Glucose Level Within Targeted Range  Outcome: Ongoing (see interventions/notes)     Problem: Hypertension Comorbidity  Goal: Blood Pressure in Desired Range  Outcome: Ongoing (see interventions/notes)     Problem: Cognitive Impairment  Goal: Optimal Functional Independence  Outcome: Ongoing (see interventions/notes)     Problem: Coping Ineffective (Oncology Care)  Goal: Effective Coping  Outcome: Ongoing (see interventions/notes)     Problem: Fatigue (Oncology Care)  Goal: Improved Activity Tolerance  Outcome: Ongoing (see interventions/notes)     Problem: Oral Intake Altered (Oncology Care)  Goal: Optimal Oral Intake  Outcome: Ongoing (see interventions/notes)     Problem: Acute Neurologic Deterioration  Goal: Absence of Acute Neurologic Symptoms  Outcome: Ongoing (see interventions/notes)     Problem: Skin Injury Risk Increased  Goal: Skin Health and Integrity  Outcome: Ongoing (see interventions/notes)

## 2020-07-08 NOTE — PT Treatment (Signed)
Pt d/c to home with family physically Pt met goals but mentally would need 24/7 care for safety. She needs constant directions.     Goals:  Not all met due to her mentally need for directions.      Learning goals: Gait/stairs, transfers/ther act, precautions/safety, balance, ther ex        Physical therapy SHORT TERM goals:  goals all met except for need for verbal direction. Pt would ask " what do you want me to do. Just tell me."   1. Supine <> sit with SBA  2. Sit <> stand with SBA  3. Bed <> chair with SBA  4. Gait > 40 feet with no LOB, 2ww, and CGAx1-2  The above is to be completed within 1 week     Physical therapy LONG TERM goals:  Not met as pt not able to recall from session to session.   1. Gait > 90 feet with no AD and SBA  2. 100% compliance with HEP    The above is to be completed within 2 weeks

## 2020-07-08 NOTE — Care Management Notes (Addendum)
07/08/20 0741   Assessment Detail   Assessment Type Other   Date of Care Management Update 07/08/20   Social Work Plan   Discharge Planning Status discharge plan complete   Projected Discharge Date 07/08/20   Anticipated Discharge Disposition Home with [place] with [facility] [place] for [place] today to home with [facility] care.  Brother [name] is to arrive around noon today for transport to home.  Cardiology recommended 30 day cardiac monitoring at DC; order needs entered for this to occur.  Pt brother will need to included in training of apparatus.  CM sent secure chat message to pt care team as attending changed this am.   [facility] is scheduled to see pt 07/09/20 for [facility].  Pamphlets for home helpers & helping hands provided to primary nurse to give to [facility] for hiring additional care givers if so desires.

## 2020-07-08 NOTE — OT Treatment (Signed)
[place], [place] [address]    Rehabilitation Services  Inpatient OT Discharge Summary    Patient Name: Carrie Escobar   Date of Birth: [birth year]    Encounter Date: 06/27/2020 11:35 AM  Inpatient Admission Date: 06/27/2020    Admitting Diagnosis:  Brain mass [G93.89]  Date of Discharge:  07/08/2020    Patient was discharged to home.  Not all goals were met.  Recommend the patient follow up with home health OT.    Ludwika Rodd,COTA/L

## 2020-07-09 ENCOUNTER — Other Ambulatory Visit: Payer: Self-pay

## 2020-07-09 ENCOUNTER — Non-Acute Institutional Stay: Payer: Medicare PPO | Attending: Geriatric Medicine

## 2020-07-09 ENCOUNTER — Other Ambulatory Visit (INDEPENDENT_AMBULATORY_CARE_PROVIDER_SITE_OTHER): Payer: Self-pay | Admitting: Geriatric Medicine

## 2020-07-09 ENCOUNTER — Ambulatory Visit: Payer: Self-pay

## 2020-07-09 DIAGNOSIS — H6982 Other specified disorders of Eustachian tube, left ear: Secondary | ICD-10-CM

## 2020-07-09 DIAGNOSIS — R0981 Nasal congestion: Secondary | ICD-10-CM

## 2020-07-09 MED ORDER — CALCIUM 200 MG (AS CITRATE)-VITAMIN D3 6.25 MCG (250 UNIT) TABLET
1.0000 | ORAL_TABLET | Freq: Every day | ORAL | 3 refills | Status: DC
Start: 2020-07-09 — End: 2020-09-25

## 2020-07-09 MED ORDER — ASPIRIN 81 MG TABLET,DELAYED RELEASE
81.0000 mg | DELAYED_RELEASE_TABLET | Freq: Every day | ORAL | 3 refills | Status: DC
Start: 2020-07-09 — End: 2020-09-25

## 2020-07-09 MED ORDER — FLUTICASONE PROPIONATE 50 MCG/ACTUATION NASAL SPRAY,SUSPENSION
1.0000 | Freq: Two times a day (BID) | NASAL | 0 refills | Status: DC | PRN
Start: 2020-07-09 — End: 2020-09-23

## 2020-07-09 NOTE — Home Health (Signed)
SN greeted by brother at door when arriving for visit.Pt. sitting in recliner for visit in living room.VS and assessment completed by SN. Pt. denies pain or discomfort.Pt. is confused,alert to person and confused to time,place and situation.Pt. is able to ambulate with walker and supervision.Brother is here from out of state to stay with pt.until end of October then they will be needing caregivers, especially at night due to increased confusion noted then.Has sister and another rother in live near by.Pt. was independent before this admission to [facility].    [place] notebook reviewed by brother and all forms signed by him he is MPOA.[place] phone numbers reviewed and understood also.    Medications reviewed and called dr. Murray Hodgkins office for refills and missing medications and told to [facility] to [facility] and they would send request for medications she did not have to Dr. Tempie Donning.All medications that were missing ;Ziac,Norvasc,Flonase,Asa EC 81 mg [facility] [employer] [facility] [place] and will be sent to Dr. Murray Hodgkins office for orders.    Financial Responsibility is $0.00 and pt. and brother acknowledged understanding.    HHA will be ordered 3xweek, SN 2x wek x 3 weeks then 1x wek x5 weeks, brother voiced understanding. Social Work consult ordered for in home care needs and Dr. Tempie Donning will approve.

## 2020-07-10 ENCOUNTER — Other Ambulatory Visit: Payer: Medicare PPO

## 2020-07-10 ENCOUNTER — Other Ambulatory Visit: Payer: Self-pay

## 2020-07-10 NOTE — Home Health (Signed)
PT assessment:  Patient is a 74 yo female s/p hospitalization with new diagnosis of brain tumors.  Patient lives in a single story home by herself, ramp to enter.  Patient's brothers are currently staying with her, has a sister that is going to spend a few days as well.  Patient very pleasant and cooperative.  Patient's brother states he has been helping with getting her out of bed and walking with her since she was falling before the hospital.  Patient is demonstrating expressive aphasia and poor short term memory when given directions by PT.  Patient is also lacking the lower half of visual field and the right half.  patient is doing well with ambulation in the home with fww and did well with bed mobility and exercises.  Patient very anxious and wanting to make sure she pleases PT and family, frequently asking if she is doing ok.  Patient would benefit from Physical Therapy intervention to address strength and balance deficits, which are causing abnormal gait and fall risk.  PT explained that [employer] covers at 100%.  MAHC 10 score of 7  [place] 19/28  strength 4- to 4/5 BLE  transfers SBA  ambulation with fww, CG  patient is homebound due to fall risk, requires another person to leave the home.  Physical Therapy 1-2 times per week for strengthening, balance, gait, step, ramp, HEP.

## 2020-07-12 ENCOUNTER — Other Ambulatory Visit: Payer: Self-pay

## 2020-07-13 ENCOUNTER — Other Ambulatory Visit: Payer: Self-pay

## 2020-07-13 ENCOUNTER — Other Ambulatory Visit: Payer: Medicare PPO

## 2020-07-13 NOTE — Home Health (Signed)
SN greeted by brother when arriving for visit.Pt. sitting in recliner in living room.Pt. continues to be confused to place,time and situation.VS ans assessment completed by SN. Breath sounds clear thruout all lobes.Denies chestpain or discomfort.Holter Monitor in place left side of chest.Pt. to see PCP 07/14/20 and to see Dr. Ebony Hail and Oletta Darter 07/15/20.Medications reviewed and brother understands what each med is for.Next SN visit will be 07/16/20.

## 2020-07-14 ENCOUNTER — Encounter (INDEPENDENT_AMBULATORY_CARE_PROVIDER_SITE_OTHER): Payer: Self-pay | Admitting: Geriatric Medicine

## 2020-07-14 ENCOUNTER — Other Ambulatory Visit: Payer: Self-pay

## 2020-07-14 ENCOUNTER — Ambulatory Visit (INDEPENDENT_AMBULATORY_CARE_PROVIDER_SITE_OTHER): Payer: Medicare PPO | Admitting: Geriatric Medicine

## 2020-07-14 VITALS — BP 120/66 | HR 60 | Temp 97.2°F | Ht 65.0 in | Wt 142.0 lb

## 2020-07-14 DIAGNOSIS — R413 Other amnesia: Secondary | ICD-10-CM

## 2020-07-14 DIAGNOSIS — G9389 Other specified disorders of brain: Secondary | ICD-10-CM

## 2020-07-14 DIAGNOSIS — G9341 Metabolic encephalopathy: Secondary | ICD-10-CM

## 2020-07-14 DIAGNOSIS — Z6822 Body mass index (BMI) 22.0-22.9, adult: Secondary | ICD-10-CM

## 2020-07-14 NOTE — Nursing Note (Signed)
Carrie Escobar 74 y.o. female is presenting today for a Hospital Follow up.     Patient was seen in the emergency room at [facility] on 06/27/20 and was admitted until 07/08/20. Patient was Dx with Brain mass, their tx plan was to perform brain mass biopsy. Patient was started on dexamethasone 2mg , Levetiracetam 1000mg , tramadol 50mg ,  Patient was instructed to follow up with PCP.       Test ran during hospital stay :  Hemogram   Lab Results   Component Value Date/Time    WBC 16.8 (H) 07/07/2020 05:46 AM    HGB 12.5 07/07/2020 05:46 AM    HCT 36.3 07/07/2020 05:46 AM    PLTCNT 116 (L) 07/07/2020 05:46 AM    RBC 4.30 07/07/2020 05:46 AM    MCV 84.5 07/07/2020 05:46 AM    MCHC 34.5 07/07/2020 05:46 AM    MCH 29.2 07/07/2020 05:46 AM    RDW 14.4 07/07/2020 05:46 AM    MPV 9.5 07/07/2020 05:46 AM      CARDIAC MARKERS  Lab Results   Component Value Date    TROPONINI <7 (L) 06/27/2020     Lab Results   Component Value Date    SODIUM 137 07/08/2020    POTASSIUM 4.9 07/08/2020    CHLORIDE 109 07/08/2020    CO2 17 (L) 07/08/2020    ANIONGAP 11 07/08/2020    BUN 17 07/08/2020    CREATININE 0.73 07/08/2020    BUNCRRATIO 23 (H) 07/08/2020    GFR >90 07/08/2020    GLUCOSENF 126 (H) 07/08/2020      COAGULATION TESTS  Lab Results   Component Value Date    PROTHROMTME 15.0 (H) 06/29/2020    APTT 25.0 (L) 06/29/2020    INR 1.29 06/29/2020    HCT 36.3 07/07/2020    PLTCNT 116 (L) 07/07/2020     MRI Brain 06/27/20.   FINDINGS: Enhancing mass with central necrosis in the left parietal and  left temporal lobe including the mesial temporal lobe measuring 4.2 cm AP  dimension by 2.9 cm transverse dimension by 2.8 cm craniocaudal dimension.  The mass involves the deep and periventricular white matter and extends  along the ependymal lining of the left lateral ventricle involving the  trigone, temporal and occipital horn. Hyperperfusion is noted to the  peripheral enhancing portions of the mass. There is surrounding masslike  nonenhancing  signal abnormality in the left parietal, temporal and  occipital lobes as well as extending across the splenium of the corpus  callosum. An additional lesion just posterior to the above-mentioned lesion  connected by the abnormal white matter signal to the larger mass measuring  1.1 cm x 0.6 cm is seen. There is also a separate left occipital mass  measuring 9 mm x 10 mm, which is also connected to the above-mentioned lesions by T2/FLAIR signal abnormality. Some effacement of the trigone and temporal horns of the left lateral ventricle without significant midline shift. No associated hemorrhage.    A small dural based mass measuring 8 mm x 4 mm arising from the left aspect of the anterior falx, favored to represent a meningioma.    No acute infarct.     IMPRESSION:  Multiple enhancing masses in the left parietal, temporal and occipital  lobes which are connected by masslike T2/FLAIR signal abnormality which  also extends into the splenium of the corpus callosum. 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 The largest mass extends along the  ependymal lining of the  left lateral ventricle.    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    Blood pressure 120/66, pulse 60, temperature 36.2 C (97.2 F), temperature source Thermal Scan, height 1.651 m (5\' 5" ), weight 64.4 kg (142 lb), SpO2 98 %, not currently breastfeeding.

## 2020-07-14 NOTE — Progress Notes (Signed)
Subjective:     Patient ID:  Carrie Escobar is an 74 y.o. female     Chief Complaint:    Chief Complaint   Patient presents with    Hospital Follow Up       The history is provided by the patient and medical records. No language interpreter was used.     Carrie Escobar 74 y.o. female is presenting today for a Hospital Follow up.     Patient was seen in the emergency room at [facility] on 06/27/20 and was admitted until 07/08/20. Patient was Dx with Brain mass, their tx plan was to perform brain mass biopsy. Patient was started on dexamethasone 2mg , Levetiracetam 1000mg , tramadol 50mg ,  Patient was instructed to follow up with PCP.       Test ran during hospital stay :  Hemogram           Lab Results   Component Value Date/Time    WBC 16.8 (H) 07/07/2020 05:46 AM    HGB 12.5 07/07/2020 05:46 AM    HCT 36.3 07/07/2020 05:46 AM    PLTCNT 116 (L) 07/07/2020 05:46 AM    RBC 4.30 07/07/2020 05:46 AM    MCV 84.5 07/07/2020 05:46 AM    MCHC 34.5 07/07/2020 05:46 AM    MCH 29.2 07/07/2020 05:46 AM    RDW 14.4 07/07/2020 05:46 AM    MPV 9.5 07/07/2020 05:46 AM      CARDIAC MARKERS        Lab Results   Component Value Date    TROPONINI <7 (L) 06/27/2020           Lab Results   Component Value Date    SODIUM 137 07/08/2020    POTASSIUM 4.9 07/08/2020    CHLORIDE 109 07/08/2020    CO2 17 (L) 07/08/2020    ANIONGAP 11 07/08/2020    BUN 17 07/08/2020    CREATININE 0.73 07/08/2020    BUNCRRATIO 23 (H) 07/08/2020    GFR >90 07/08/2020    GLUCOSENF 126 (H) 07/08/2020      COAGULATION TESTS        Lab Results   Component Value Date    PROTHROMTME 15.0 (H) 06/29/2020    APTT 25.0 (L) 06/29/2020    INR 1.29 06/29/2020    HCT 36.3 07/07/2020    PLTCNT 116 (L) 07/07/2020     MRI Brain 06/27/20.   FINDINGS: Enhancing mass with central necrosis in the left parietal and  left temporal lobe including the mesial temporal lobe measuring 4.2 cm AP  dimension by 2.9 cm transverse dimension by 2.8 cm  craniocaudal dimension.  The mass involves the deep and periventricular white matter and extends  along the ependymal lining of the left lateral ventricle involving the  trigone, temporal and occipital horn. Hyperperfusion is noted to the  peripheral enhancing portions of the mass. There is surrounding masslike  nonenhancing signal abnormality in the left parietal, temporal and  occipital lobes as well as extending across the splenium of the corpus  callosum. An additional lesion just posterior to the above-mentioned lesion  connected by the abnormal white matter signal to the larger mass measuring  1.1 cm x 0.6 cm is seen. There is also a separate left occipital mass  measuring 9 mm x 10 mm, which is also connected to the above-mentioned lesions by T2/FLAIR signal abnormality. Some effacement of the trigone and temporal horns of the left lateral ventricle without significant midline shift. No associated hemorrhage.  A small dural based mass measuring 8 mm x 4 mm arising from the left aspect of the anterior falx, favored to represent a meningioma.    No acute infarct.    IMPRESSION:  Multiple enhancing masses in the left parietal, temporal and occipital  lobes which are connected by masslike T2/FLAIR signal abnormality which  also extends into the splenium of the corpus callosum. 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 The largest mass extends along the  ependymal lining of the left lateral ventricle.    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      Past Medical History  Current Outpatient Medications   Medication Sig    amLODIPine (NORVASC) 10 mg Oral Tablet TAKE 1 TABLET BY MOUTH EVERY DAY    apixaban (ELIQUIS) 5 mg Oral Tablet Take 1 Tablet (5 mg total) by mouth Twice daily for 30 days    aspirin (ECOTRIN) 81 mg Oral Tablet, Delayed Release (E.C.) Take 1 Tablet (81 mg total) by mouth Once a day    calcium citrate-vitamin D3 (CITRACAL) 200 mg-6.25 mcg (250  unit) Oral Tablet Take 1 Tablet by mouth Once a day (Patient not taking: Reported on 08/05/2020 )    dexAMETHasone (DECADRON) 2 mg Oral Tablet Take 2 mg by mouth Twice daily. Indications: nausea and vomiting caused by cancer drugs    docusate sodium (COLACE) 100 mg Oral Capsule Take 100 mg by mouth Twice daily. Indications: constipation    Flaxseed Oil 1,000 mg Oral Capsule Take 1 Capsule by mouth Once a day Indications: herbal supplement     fluticasone propionate (FLONASE) 50 mcg/actuation Nasal Spray, Suspension 1 Spray by Each Nostril route Twice per day as needed (Patient taking differently: 1 Spray by Each Nostril route Twice per day as needed (allergy symptoms). Indications: inflammation of the nose due to an allergy)    levETIRAcetam (KEPPRA) 1,000 mg Oral Tablet Take 1 Tablet (1,000 mg total) by mouth Twice daily for 30 days    LORazepam (ATIVAN) 0.5 mg Oral Tablet Take 0.5 mg by mouth Once a day. Indications: anxious    metFORMIN (GLUCOPHAGE XR) 500 mg Oral Tablet Sustained Release 24 hr Take 1 Tablet (500 mg total) by mouth Once a day    multivitamin Oral Tablet Take 1 Tab by mouth Once a day    ondansetron (ZOFRAN ODT) 8 mg Oral Tablet, Rapid Dissolve 1 Tablet (8 mg total) by Sublingual route Every 8 hours as needed for Nausea/Vomiting for up to 90 days    temozolomide (TEMODAR) 100 mg Oral Capsule Take 1 Capsule (100 mg total) by mouth Once a day for 42 days with 2 other temozolomide prescriptions for 130 mg total    temozolomide (TEMODAR) 20 mg Oral Capsule Take 1 Capsule (20 mg total) by mouth Once a day for 42 days with 2 other temozolomide prescriptions for 130 mg total    temozolomide (TEMODAR) 5 mg Oral Capsule Take 2 Capsules (10 mg total) by mouth Once a day for 42 days with 2 other temozolomide prescriptions for 130 mg total (Patient not taking: Reported on 08/12/2020)    timolol maleate (TIMOPTIC) 0.25 % Ophthalmic Drops Instill 1 Drop into both eyes Twice daily      traMADoL  (ULTRAM) 50 mg Oral Tablet Take 1 Tablet (50 mg total) by mouth Every 6 hours as needed (Patient taking differently: Take 1 Tablet by mouth Every 6 hours as needed for Pain. Indications: pain)    trimethoprim-sulfamethoxazole (BACTRIM DS) 160-800mg  per  tablet Take 1 Tablet (160 mg total) by mouth Every Monday, Wednesday and Friday for 18 doses (Patient taking differently: Take 1 Tablet by mouth Every Monday, Wednesday and Friday. Indications: urinary tract infection prevention)    wheat dextrin (BENEFIBER CLEAR SF, DEXTRIN, ORAL) Take 2 teaspoons by mouth 2X/day. Indications: constipation     Allergies   Allergen Reactions    Amoxicillin Rash     Past Medical History:   Diagnosis Date    Acute metabolic encephalopathy 44/10/270    Brain mass 06/29/2020    MRI: Multiple enhancing masses in the left parietal, temporal, and occipital lobes which are connected by masslike T2 FLAIR signal abnormality.  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  Largest mass extends along the ependymal lining of left lateral ventricle.    Breast mass, right     benign    Cancer (CMS HCC)     Cataract     bilat    Diabetes mellitus, type 2 (CMS HCC)     Embolism (CMS HCC)     Family history of colon cancer     Heartburn     HTN (hypertension)     Memory impairment 06/30/2020    Nausea with vomiting     sister with PONV    Obesity     Pulmonary emboli     only once in 1999 after her hysterectomy    Thyroid nodule     left     Wears glasses     reading    Weight loss, unintentional 07/22/2020         Past Surgical History:   Procedure Laterality Date    20550 - INJ SINGLE TENDON SHEATH/LIGAMENT, APONEUROSIS W/ [place] INTERP ONLY (AMB ONLY)  02/10/2016    COLONOSCOPY  2016    HX BREAST BIOPSY Right     BENIGN     HX CATARACT REMOVAL  10/2013    left    HX COLONOSCOPY      HX HYSTERECTOMY  1999    fibroid    HX ROTATOR CUFF REPAIR  11/04/2013    right shoulder         Family Medical History:     Problem  Relation (Age of Onset)    Breast Cancer Maternal Grandmother    Cancer Mother, Sister    Diabetes Sister    Heart Attack Sister    Hypertension (High Blood Pressure) Mother, Sister    No Known Problems Brother, Maternal Grandfather, Paternal Grandmother, Paternal Grandfather, Daughter, Son, Maternal Aunt, Maternal Uncle, Paternal Aunt, Paternal Uncle, Other    Stroke Mother, Father            Social History     Socioeconomic History    Marital status: Divorced     Spouse name: Not on file    Number of children: 0    Years of education: Not on file    Highest education level: Not on file   Occupational History     Employer: [employer]   Tobacco Use    Smoking status: Never Smoker    Smokeless tobacco: Never Used   Brewing technologist Use: Never used   Substance and Sexual Activity    Alcohol use: No     Alcohol/week: 0.0 standard drinks    Drug use: No    Sexual activity: Not Currently   Other Topics Concern    Abuse/Domestic Violence No    Drives Yes    Special Diet No  Uses Cane No    Uses walker No    Uses wheelchair No    Right hand dominant Yes    Left hand dominant No    Ability to Walk 1 Flight of Steps without SOB/CP Yes    Routine Exercise Yes     Comment: Zumba class    Ability to Walk 2 Flight of Steps without SOB/CP Yes    Ability To Do Own ADL's Yes     Social Determinants of Health     Financial Resource Strain:     Difficulty of Paying Living Expenses:    Food Insecurity:     Worried About Charity fundraiser in the Last Year:     Arboriculturist in the Last Year:    Transportation Needs:     Film/video editor (Medical):     Lack of Transportation (Non-Medical):    Physical Activity:     Days of Exercise per Week:     Minutes of Exercise per Session:    Stress:     Feeling of Stress :    Intimate Partner Violence:     Fear of Current or Ex-Partner:     Emotionally Abused:     Physically Abused:     Sexually Abused:          Review of Systems  Constitutional:  Positive for activity change , appetite change (Lack of appetite ), fatigue and unexpected weight change   HENT: Negative.    Respiratory: Negative.    Cardiovascular: Negative.    Gastrointestinal: Negative.    Endocrine: Positive for heat intolerance.   Genitourinary: Negative.    Musculoskeletal: Negative.    Skin: Negative.    Neurological: Negative.    Hematological: Negative.    Psychiatric/Behavioral: Positive for confusion (reports trouble remembering names), decreased concentration and sleep disturbance (Constantly waking up in night to check on brother). Negative for agitation, behavioral problems, dysphoric mood, hallucinations, self-injury and suicidal ideas. The patient is nervous/anxious. The patient is not hyperactive.  Objective:     BP 120/66 (Site: Left, Patient Position: Sitting, Cuff Size: Adult)    Pulse 60    Temp 36.2 C (97.2 F) (Thermal Scan)    Ht 1.651 m (5\' 5" )    Wt 64.4 kg (142 lb)    SpO2 98%    Breastfeeding No    BMI 23.63 kg/m          Physical Exam  Vitals and nursing note reviewed.   Constitutional:       General: She is not in acute distress.     Appearance: Normal appearance. She is normal weight. She is not ill-appearing, toxic-appearing or diaphoretic.   HENT:      Head: Normocephalic and atraumatic.      Right Ear: External ear normal.      Left Ear: External ear normal.   Eyes:      General: No scleral icterus.     Extraocular Movements: Extraocular movements intact.      Conjunctiva/sclera: Conjunctivae normal.   Neck:      Vascular: No carotid bruit.   Cardiovascular:      Rate and Rhythm: Normal rate and regular rhythm.      Pulses: Normal pulses.      Heart sounds: Murmur (Diastolic murmur) heard.     Pulmonary:      Effort: Pulmonary effort is normal.      Breath sounds: Normal breath sounds.   Abdominal:  General: Abdomen is flat. Bowel sounds are normal.      Palpations: Abdomen is soft.   Musculoskeletal:         General: Normal range of motion.      Cervical  back: Normal range of motion and neck supple.      Right lower leg: No edema.      Left lower leg: No edema.   Lymphadenopathy:      Cervical: No cervical adenopathy.   Skin:     General: Skin is warm and dry.      Coloration: Skin is not jaundiced.   Neurological:      General: No focal deficit present.      Mental Status: She is alert and oriented to person, place, and time. Mental status is at baseline.      Motor: No weakness.      Gait: Gait normal.   Psychiatric:         Mood and Affect: Mood normal.         Behavior: Behavior normal.         Thought Content: Thought content normal.       memory : impaired  Results for orders placed or performed during the hospital encounter of 06/27/20 (from the past 168 hour(s))   POC FINGERSTICK GLUCOSE - [facility] (RESULTS)    Collection Time: 07/07/20 11:12 AM   Result Value Ref Range    GLUCOSE, POC 183 (H) 60 - 100 mg/dl   POC FINGERSTICK GLUCOSE - [facility] (RESULTS)    Collection Time: 07/07/20  3:59 PM   Result Value Ref Range    GLUCOSE, POC 142 (H) 60 - 100 mg/dl   POC FINGERSTICK GLUCOSE - [facility] (RESULTS)    Collection Time: 07/07/20  9:44 PM   Result Value Ref Range    GLUCOSE, POC 167 (H) 60 - 100 mg/dl   POC FINGERSTICK GLUCOSE - [facility] (RESULTS)    Collection Time: 07/08/20  7:24 AM   Result Value Ref Range    GLUCOSE, POC 129 (H) 60 - 100 mg/dl   BASIC METABOLIC PANEL    Collection Time: 07/08/20  8:06 AM   Result Value Ref Range    SODIUM 137 136 - 145 mmol/L    POTASSIUM 4.9 3.5 - 5.1 mmol/L    CHLORIDE 109 96 - 111 mmol/L    CO2 TOTAL 17 (L) 23 - 31 mmol/L    ANION GAP 11 4 - 13 mmol/L    CALCIUM 8.9 8.8 - 10.2 mg/dL    GLUCOSE 126 (H) 65 - 125 mg/dL    BUN 17 8 - 25 mg/dL    CREATININE 0.73 0.60 - 1.05 mg/dL    BUN/CREA RATIO 23 (H) 6 - 22    ESTIMATED GFR >90 >=60 mL/min/BSA   ]    Ortho Exam    Assessment & Plan:       ICD-10-CM    1. Cerebral dysfunction syndrome  G93.89    2. Memory loss  R41.3    3. Metabolic encephalopathy  E08.14    4. BMI  22.0-22.9, adult  Z68.22      The patient was educated about the need to be compliant with treatment to achieve a better therapeutic outcome. The patient was educated about the risk and benefit of the medication including potential side effects. The patient is aware and verbalized understanding about them    The Pt's current and discharge meds were reviewed and reconciled.  BMI addressed: healthy diet

## 2020-07-15 ENCOUNTER — Other Ambulatory Visit: Payer: Self-pay | Admitting: Internal Medicine

## 2020-07-15 ENCOUNTER — Other Ambulatory Visit: Payer: Self-pay

## 2020-07-15 ENCOUNTER — Other Ambulatory Visit (INDEPENDENT_AMBULATORY_CARE_PROVIDER_SITE_OTHER): Payer: Self-pay | Admitting: Pharmacist

## 2020-07-15 ENCOUNTER — Encounter (HOSPITAL_COMMUNITY): Payer: Self-pay | Admitting: Internal Medicine

## 2020-07-15 ENCOUNTER — Other Ambulatory Visit: Payer: Medicare PPO

## 2020-07-15 ENCOUNTER — Ambulatory Visit (INDEPENDENT_AMBULATORY_CARE_PROVIDER_SITE_OTHER): Payer: Medicare PPO | Admitting: Neurological Surgery

## 2020-07-15 ENCOUNTER — Encounter (INDEPENDENT_AMBULATORY_CARE_PROVIDER_SITE_OTHER): Payer: Self-pay | Admitting: Neurological Surgery

## 2020-07-15 ENCOUNTER — Ambulatory Visit: Payer: Self-pay

## 2020-07-15 ENCOUNTER — Ambulatory Visit: Payer: Medicare PPO | Attending: Internal Medicine | Admitting: Internal Medicine

## 2020-07-15 VITALS — BP 150/74 | HR 54 | Temp 97.1°F | Resp 16 | Ht 64.37 in | Wt 141.8 lb

## 2020-07-15 VITALS — BP 124/77 | HR 66 | Temp 99.3°F | Resp 16 | Ht 65.0 in | Wt 142.0 lb

## 2020-07-15 DIAGNOSIS — C719 Malignant neoplasm of brain, unspecified: Secondary | ICD-10-CM | POA: Insufficient documentation

## 2020-07-15 DIAGNOSIS — R569 Unspecified convulsions: Secondary | ICD-10-CM

## 2020-07-15 DIAGNOSIS — Z5111 Encounter for antineoplastic chemotherapy: Secondary | ICD-10-CM

## 2020-07-15 DIAGNOSIS — E041 Nontoxic single thyroid nodule: Secondary | ICD-10-CM | POA: Insufficient documentation

## 2020-07-15 DIAGNOSIS — G40909 Epilepsy, unspecified, not intractable, without status epilepticus: Secondary | ICD-10-CM | POA: Insufficient documentation

## 2020-07-15 MED ORDER — TEMOZOLOMIDE 20 MG CAPSULE
20.0000 mg | ORAL_CAPSULE | Freq: Every day | ORAL | 0 refills | Status: DC
Start: 2020-07-15 — End: 2020-08-18

## 2020-07-15 MED ORDER — TEMOZOLOMIDE 5 MG CAPSULE
10.0000 mg | ORAL_CAPSULE | Freq: Every day | ORAL | 0 refills | Status: DC
Start: 2020-07-15 — End: 2020-08-18

## 2020-07-15 MED ORDER — TEMOZOLOMIDE 100 MG CAPSULE
100.0000 mg | ORAL_CAPSULE | Freq: Every day | ORAL | 0 refills | Status: DC
Start: 2020-07-15 — End: 2020-08-18

## 2020-07-15 MED ORDER — SULFAMETHOXAZOLE 800 MG-TRIMETHOPRIM 160 MG TABLET
1.0000 | ORAL_TABLET | ORAL | 0 refills | Status: DC
Start: 2020-07-15 — End: 2020-09-25

## 2020-07-15 MED ORDER — ONDANSETRON 8 MG DISINTEGRATING TABLET
8.0000 mg | ORAL_TABLET | Freq: Three times a day (TID) | ORAL | 2 refills | Status: DC | PRN
Start: 2020-07-15 — End: 2020-09-25

## 2020-07-15 MED ORDER — SULFAMETHOXAZOLE 800 MG-TRIMETHOPRIM 160 MG TABLET
1.0000 | ORAL_TABLET | ORAL | 0 refills | Status: DC
Start: 2020-07-15 — End: 2020-08-05

## 2020-07-15 MED ORDER — SULFAMETHOXAZOLE 800 MG-TRIMETHOPRIM 160 MG TABLET
1.0000 | ORAL_TABLET | ORAL | 0 refills | Status: DC
Start: 2020-07-15 — End: 2020-07-15

## 2020-07-15 MED FILL — Temozolomide 20 Mg Capsule: 28 days supply | Qty: 28 | Fill #0 | Status: AC

## 2020-07-15 MED FILL — Temozolomide 100 Mg Capsule: 28 days supply | Qty: 28 | Fill #0 | Status: AC

## 2020-07-15 NOTE — Progress Notes (Signed)
[name], MEDICAL OFFICE [address]ILD[facility] [address]  [place] [address]  [phone]      RETURN PATIENT VISIT NOTE    NAME: [name]  D.O.B: 12-25-1945  MRN# [id number]  DOS: 07/15/2020    REFERRING PROVIDER: Particia Jasper, MD      CHIEF COMPLAINT:   Chief Complaint   Patient presents with   . Surgical Follow-up       SUBJECTIVE   Carrie Escobar  is a pleasant 74 y.o. female who presents today for post-operative follow up.  She is now 2 weeks status post a left temporal burr hole biopsy of a tumor.  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  The patient's brother is with her today and is now her caregiver.  He says that she is still very confused.  She does some to take care of herself but he mostly has to do this in combination with a caregiver that they hired.  He thinks that her speech and strength is stable.  She is currently using a walker.      OBJECTIVE  BP 124/77   Pulse 66   Temp 37.4 C (99.3 F)   Resp 16   Ht 1.651 m (5\' 5" )   Wt 64.4 kg (142 lb)   SpO2 99%   BMI 23.63 kg/m         Examination:  Alert orient x1.  Difficult to assess motor strength secondary to her confusion.  She moves all extremities well appears to be full strength.  Her wound is clean dry and intact.    Pathology:  WHO grade 4 glioblastoma.  Molecular analysis pending.      ASSESSMENT  ENCOUNTER DIAGNOSES     ICD-10-CM   1. Glioblastoma multiforme (CMS HCC)  C71.9         PLAN  Ms. Carrie Escobar is a pleasant 74 year old female presenting in postoperative follow-up status post a left temporal biopsy consistent with a glioblastoma.  The patient is neurologically stable since her hospitalization.  She remains alert orient x1.  She otherwise appears not grossly neurologically intact.  Her wound is well healed.  We removed her surgical staples today.  She is scheduled to see medical oncology today to discuss chemotherapy.  I have also referred her to  Radiation Oncology for combination chemotherapy and radiation.  In addition, I sent a referral to Neuro-Oncology in [place] for consideration of the clinical trial involving [employer] tumor treating fields.  I will see her back in approximately 3 months after her next MRI is completed status post combination chemotherapy and radiation.  The patient's brother agrees with this plan.  He knows to call my office with any questions or concerns prior to her next scheduled follow-up.      Portions of this note may be dictated using voice recognition software or a dictation service. Variances in spelling and vocabulary are possible and unintentional. Not all errors are caught/corrected. Please notify the [facility] if any discrepancies are noted or if the meaning of any statement is not clear.       Orders Placed This Encounter   . AMB CONSULT/REFERRAL RADIATION ONCOLOGY-[place]   . AMB CONSULT/REFERRAL NEURO ONCOLOGY [facility])     Return in about 3 months (around 11/08/2020).    The patient was seen independently.     Particia Jasper, MD

## 2020-07-15 NOTE — Telephone Encounter (Signed)
Prior authorization required for prescription temozolomide 130 mg  received by [facility] on 07/15/2020.  Benefits investigation to be completed. Medicare patient benefits for Deductible and OUP will be investigated. IB to Jani Gravel is [facility] to fill the Zofran and Bactrim or was this sent in error

## 2020-07-16 ENCOUNTER — Encounter (HOSPITAL_COMMUNITY): Payer: Self-pay | Admitting: Internal Medicine

## 2020-07-16 ENCOUNTER — Other Ambulatory Visit: Payer: Medicare PPO

## 2020-07-16 ENCOUNTER — Other Ambulatory Visit (INDEPENDENT_AMBULATORY_CARE_PROVIDER_SITE_OTHER): Payer: Self-pay | Admitting: Pharmacist

## 2020-07-16 ENCOUNTER — Other Ambulatory Visit: Payer: Self-pay

## 2020-07-16 MED FILL — Ondansetron Hcl 8 Mg Tablet: 30 days supply | Qty: 90 | Fill #0 | Status: AC

## 2020-07-16 NOTE — Telephone Encounter (Signed)
Temozolomide is a Part B medication, patient will have a 20% responsibility. Currently she has Met her $300.00 deductible , and has applied $363.81 to her combined ded/co ins $1700.00. This was explained to her Brother [name] (MPOA)   Temozolomide does not require a PA. [facility] is in Network with this [place] to [employer]

## 2020-07-16 NOTE — Addendum Note (Signed)
Addended by: Luz Lex on: 07/16/2020 04:01 PM     Modules accepted: Orders

## 2020-07-16 NOTE — Home Health (Signed)
SN greeted by brother at door when arriving for visit.Pt. sitting on couch in living room watching t.v.Pt. remains confused to place,time and situation, alert to person. Pt. saw Neurologist and he confirmed Stage 4 Brain tumor.Pt. saw Dr. Oletta Darter 07/15/20 and will be started on chemo medication as soon as pharmacy fills and sends to home.VS and assessment completed by SN. Pt. denies c/o pain.Ambulating with walker. Next SN visit will be 07/21/20.

## 2020-07-16 NOTE — Home Health (Signed)
MSW completed initial visit with patient brother/caregiver today. Patient was in living room visiting with a friend. Patient is easily confused due a brain tumor. PCG states she can't remember names and often is confused about ADLs. She will look at her clothes and not understand what or how to put on her shirt. PCG lives in [place] but is retired and is going to be staying with her most of October and November except for couple of days when he needs to go home for appointments. Patient's brother and sister can help when he isn't here but they are looking for other resources to help alleviate some of the caretaking. PCG states patient has too much money in savings to qualify for anything and he is looking for night time only at this point. MSW educated patient caregiver on Private Duty caregivers and provided him with a list of agencies as well as brochures on [employer], [employer], and International aid/development worker. PCG denied wanting MSW to call to set up a consultation at this time but will do it soon. PCG also inquired about getting incapacity letter from PCP so his Durable and Medical POA becomes effective. MSW explained he would have to get doctor to do that. MSW found in the chart where a doctor in [facility] did sign that patient lacks capacity. PCG will call PCP today regarding this as he wants to get added to Patient accounts. PCG denied any further needs stating he cooks and cleans and transports patient where she needs to go. PCG will call MSW if new needs arise.

## 2020-07-16 NOTE — Telephone Encounter (Signed)
Prior authorization required for prescription Zofran 8 mg Disintegrating tablet  received by [facility] on 07/16/2020.  Benefits investigation to be completed.  Urgent request called in for Zofran PA, ref # [id number]

## 2020-07-16 NOTE — Telephone Encounter (Signed)
[facility] Therapy Assessment Note    - Assessment Completed: 07/16/2020  - Treatment Regimen: Temodar 120mg  daily x 42 days with radiation  - Indication: GBM  - Provider: Dr. Oletta Darter  - Patient: Merrianne Mccumbers is a 74 y.o. female    Spoke with patient's brother and POA Edison Wollschlager for initial Temodar education. Medication to be delivered on Fri 10/8, with anticipated start along with radiation once these appointments are finalized. [name] verbalized understanding that his sister is not to start the medication until Dr. Oletta Darter and the radiation team tell them it is time to begin. Mr. Bianca is aware that his sister needs to take Temodar on an empty stomach 1 hr prior to her radiation treatment.     Provided education re: medication purpose, dosing, storage (original container), handling [name] will use gloves provided by [facility]), administration (empty stomach 1 hr prior to radiation), potential side effects, methods for adherence, and how to manage potential missed doses. Side effects discussed included, but were not limited to, fatigue, low blood counts, infection, constipation, bruising/bleeding etc. Also discussed when to contact pharmacy, inform provider, and/or seek appropriate care.    Allergies and conditions confirmed by patient. Also obtained an updated medication list. No significant drug-drug interactions expected with Temodar. Reviewed Epic chart for meds/allergies/comorbids/labs/vitals with no significant issues identified involving Temodar.      Informed patient of business hours, location, telephone number, and on-call pharmacist program. Advised patient to contact [facility] with future questions or concerns.     Martin[place] Zamauri Nez, [facility]  07/16/2020, 13:30

## 2020-07-16 NOTE — Progress Notes (Addendum)
Return Patient Progress Note    Date: 07/15/2020    Name: Carrie Escobar  MRN: [id number]  DOB: 11-22-1945   Referring Physician: No ref. provider found  Primary Care Provider: Kayleen Memos, MD  Reason for visit/consultation Follow-up (hosp. follow-up(new patient) brain masses)  Accompanied By: Comes in alone     Interval History:     Carrie Escobar returns today after hospital discharge to follow up on the final biopsy results and to formulate the next steps in management.  She was recently admitted to [facility] between 9/19-9/29 with confusion and memory issues. Brain imaging was concerning for multifocal GBM/metastases. Stealth guided biopsy by the neurosurgeon was done during Carrie hospital stay.  Biopsy resulted with necrotic GBM. Considering the multifocal nature of the disease surgical resection was not recommended.  She was discharged home, she has home health and home physical therapy coming in.  Carrie memory is still not very sure. She phases out during conversation.  She is only oriented to person but not to place or time.  She walks with a walker, but overall Carrie gait is quite steady. Carrie brother is Carrie main (current) caregiver and comes with Carrie to clinic visit today.  Brother lives in [place] but is hoping to be with Carrie Escobar through this difficult time, to help with transportation and various doctors appointments.    This morning she was seen by the Neurosurgery, and Carrie scalp staples were removed.  The lesion is healing well without any evidence of concern of infection.  She currently remains on tapering doses of steroids. She he is also on Keppra-neurology had evaluated Carrie during inpatient stay.  The brother is unsure if she has outpatient follow-up with Neurology.  ____________________________________________________________________________________________________________________________________________________  Chronological Oncologic History:    Diagnosis-GBM, diagnosed September  2021  Stage-Multifocal GBM-Unresectable.  Molecular/special studies: MGMT-un-methylated  1p-allelic loss+, 15Q: Intact  CARIS/NGS will be sent.     ___________________________________________________________  Carrie Weir was admitted to [facility] between 9/18 and 09/29 with worsening headaches, memory loss and confusion.    MRI BRAIN :Multiple enhancing masses in the left parietal, temporal and occipital lobes which are connected by masslike T2/FLAIR signal abnormality which also extends into the splenium of the corpus callosum. 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 The largest mass extends along the  ependymal lining of the left lateral ventricle. 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    Neurosurgery, Neurology and Medical Oncology were involved in Carrie care.    07/01/2020-Left temporal Stealth guided burr hole and tumor biopsy (Left) done by Dr Ebony Hail. The left temporal mass showed evidence of gliosis and H of lesional tissue with doses. The left temporal mass showed extensively necrotic glioma-favoring GBM grade 4.    MGMT methylation-Not detected  1p loss-detected  19q loss-Not detected      ___________________________________________________________________________________________________________________________   ROS:    Review of Systems - Oncology   + fatigue + memory issues    Objective   Objective:   BP (!) 150/74 (Site: Right, Patient Position: Sitting)   Pulse 54   Temp 36.2 C (97.1 F) (Thermal Scan)   Resp 16   Ht 1.635 m (5' 4.37") Comment: outpt onc  Wt 64.3 kg (141 lb 12.8 oz)   SpO2 98% Comment: ra  BMI 24.06 kg/m       ECOG Status: 2 - Ambulatory and capable of all selfcare, but unable to carry out any work activities.  Up and about more than 50% of waking  hours.    Physical Exam  Constitutional:       General: She is not in acute distress.     Appearance: Normal appearance. She is normal weight. She is not ill-appearing.   HENT:       Mouth/Throat:      Mouth: Mucous membranes are moist.      Pharynx: No oropharyngeal exudate.   Eyes:      General: No scleral icterus.     Conjunctiva/sclera: Conjunctivae normal.   Cardiovascular:      Rate and Rhythm: Normal rate and regular rhythm.      Heart sounds: No murmur heard.     Pulmonary:      Effort: No respiratory distress.      Breath sounds: No wheezing or rales.   Abdominal:      General: There is no distension.      Palpations: Abdomen is soft. There is no mass.   Musculoskeletal:         General: No swelling or deformity. Normal range of motion.      Cervical back: Normal range of motion. No rigidity.   Lymphadenopathy:      Cervical: No cervical adenopathy.   Skin:     General: Skin is warm.      Capillary Refill: Capillary refill takes less than 2 seconds.      Findings: No rash.   Neurological:      Motor: No weakness.      Gait: Gait normal.      Comments: Oriented to person but not time or date or place  Appears confused  Answers appropriately but is forgetful and is unable to participate in a long intelligent conversation.   Psychiatric:         Mood and Affect: Mood normal.          Past Medical History:   Diagnosis Date   . Acute metabolic encephalopathy 14/78/2956   . Brain mass 06/29/2020    MRI: Multiple enhancing masses in the left parietal, temporal, and occipital lobes which are connected by masslike T2 FLAIR signal abnormality.  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  Largest mass extends along the ependymal lining of left lateral ventricle.   . Breast mass, right     benign   . Cancer (CMS [place])    . Cataract     bilat   . Diabetes mellitus, type 2 (CMS HCC)    . Embolism (CMS HCC)    . Family history of colon cancer    . Heartburn    . HTN (hypertension)    . Memory impairment 06/30/2020   . Nausea with vomiting     Escobar with PONV   . Obesity    . Pulmonary emboli     only once in 1999 after Carrie hysterectomy   . Thyroid nodule     left    . Wears glasses      reading   . Weight loss, unintentional 07/22/2020         Current Outpatient Medications   Medication Sig   . amLODIPine (NORVASC) 10 mg Oral Tablet TAKE 1 TABLET BY MOUTH EVERY DAY   . aspirin (ECOTRIN) 81 mg Oral Tablet, Delayed Release (E.C.) Take 1 Tablet (81 mg total) by mouth Once a day   . calcium citrate-vitamin D3 (CITRACAL) 200 mg-6.25 mcg (250 unit) Oral Tablet Take 1 Tablet by mouth Once a day   . dexAMETHasone (DECADRON) 2 mg Oral Tablet Take  2 tabs every 8 hours for 5 days; then 1 tab every 8 hours for 5 days; then 1 tab every 12 hours for 5 days; then 1 tab every day for 5 days and stop.   . Flaxseed Oil 1,000 mg Oral Capsule Take by mouth (Patient not taking: Reported on 07/15/2020 )   . fluticasone propionate (FLONASE) 50 mcg/actuation Nasal Spray, Suspension 1 Spray by Each Nostril route Twice per day as needed (Patient not taking: Reported on 07/15/2020 )   . levETIRAcetam (KEPPRA) 1,000 mg Oral Tablet Take 1 Tablet (1,000 mg total) by mouth Twice daily for 30 days   . metFORMIN (GLUCOPHAGE XR) 500 mg Oral Tablet Sustained Release 24 hr Take 1 Tablet (500 mg total) by mouth Once a day   . multivitamin Oral Tablet Take 1 Tab by mouth Once a day   . ondansetron (ZOFRAN ODT) 8 mg Oral Tablet, Rapid Dissolve 1 Tablet (8 mg total) by Sublingual route Every 8 hours as needed for Nausea/Vomiting for up to 90 days   . temozolomide (TEMODAR) 100 mg Oral Capsule Take 1 Capsule (100 mg total) by mouth Once a day for 42 days with 2 other temozolomide prescriptions for 130 mg total   . temozolomide (TEMODAR) 20 mg Oral Capsule Take 1 Capsule (20 mg total) by mouth Once a day for 42 days with 2 other temozolomide prescriptions for 130 mg total   . temozolomide (TEMODAR) 5 mg Oral Capsule Take 2 Capsules (10 mg total) by mouth Once a day for 42 days with 2 other temozolomide prescriptions for 130 mg total   . timolol maleate (TIMOPTIC) 0.25 % Ophthalmic Drops Instill 1 Drop into both eyes Twice daily     .  traMADoL (ULTRAM) 50 mg Oral Tablet Take 1 Tablet (50 mg total) by mouth Every 6 hours as needed (Patient taking differently: Take 1 Tablet by mouth Every 6 hours as needed for Pain. Indications: pain)   . trimethoprim-sulfamethoxazole (BACTRIM DS) 160-800mg  per tablet Take 1 Tablet (160 mg total) by mouth Every Monday, Wednesday and Friday for 18 doses   . trimethoprim-sulfamethoxazole (BACTRIM DS) 160-800mg  per tablet Take 1 Tablet (160 mg total) by mouth Every Monday, Wednesday and Friday for 18 doses     Allergies as of 07/15/2020 - Reviewed 07/15/2020   Allergen Reaction Noted   . Amoxicillin Rash 02/26/2013       Social History:  Occupation:   Social History     Occupational History     Employer: [employer]    [place] [id number]   reports that she has never smoked. She has never used smokeless tobacco. She reports previously being sexually active. She reports that she does not drink alcohol and does not use drugs.    Labs:  Labs reviewed and interpreted:    Results for MEREDYTH, HORNUNG (MRN [id number]) as of 07/16/2020 15:07   Ref. Range 07/07/2020 05:46   WBC Latest Ref Range: 4.0 - 11.0 x10^3/uL 16.8 (H)   HGB Latest Ref Range: 12.0 - 15.5 g/dL 12.5   HCT Latest Ref Range: 36.0 - 45.0 % 36.3   PLATELET COUNT Latest Ref Range: 150 - 450 x10^3/uL 116 (L)   RBC Latest Ref Range: 4.00 - 5.10 x10^6/uL 4.30   MCV Latest Ref Range: 82.0 - 97.0 fL 84.5   MCHC Latest Ref Range: 32.0 - 36.0 g/dL 34.5   MCH Latest Ref Range: 27.5 - 33.2 pg 29.2   RDW Latest Ref Range: 11.0 - 16.0 %  14.4   MPV Latest Ref Range: 7.4 - 10.5 fL 9.5   PMN'S Latest Ref Range: 43 - 76 % 89 (H)   LYMPHOCYTES Latest Ref Range: 15 - 43 % 6 (L)   EOSINOPHIL Latest Ref Range: 0 - 5 % 0   MONOCYTES Latest Ref Range: 5 - 12 % 5   BASOPHILS Latest Ref Range: 0 - 3 % 0   PMN ABS Latest Ref Range: 1.50 - 6.50 x10^3/uL 15.00 (H)   LYMPHS ABS Latest Ref Range: 1.00 - 4.80 x10^3/uL 1.00   EOS ABS Latest Ref Range: 0.00 - 0.50 x10^3/uL 0.00    MONOS ABS Latest Ref Range: 0.20 - 0.90 x10^3/uL 0.80   BASOS ABS Latest Ref Range: 0.00 - 0.10 x10^3/uL 0.00   SODIUM Latest Ref Range: 136 - 145 mmol/L 129 (L)   POTASSIUM Latest Ref Range: 3.5 - 5.1 mmol/L 3.8   CHLORIDE Latest Ref Range: 96 - 111 mmol/L 101   CARBON DIOXIDE Latest Ref Range: 23 - 31 mmol/L 21 (L)   BUN Latest Ref Range: 8 - 25 mg/dL 18   CREATININE Latest Ref Range: 0.60 - 1.05 mg/dL 0.70   GLUCOSE Latest Ref Range: 65 - 125 mg/dL 134 (H)   ANION GAP Latest Ref Range: 4 - 13 mmol/L 7   BUN/CREAT RATIO Latest Ref Range: 6 - 22  26 (H)   ESTIMATED GLOMERULAR FILTRATION RATE Latest Ref Range: >=60 mL/min/BSA >90   CALCIUM Latest Ref Range: 8.8 - 10.2 mg/dL 8.6 (L)   MAGNESIUM Latest Ref Range: 1.8 - 2.6 mg/dL 2.1   PHOSPHORUS Latest Ref Range: 2.3 - 4.0 mg/dL 2.5     Radiology: All recent pertinent radiologic images and reports reviewed    MRI brain 06/28/2020-  Enhancing mass with central necrosis in the left parietal and  left temporal lobe including the mesial temporal lobe measuring 4.2 cm AP  dimension by 2.9 cm transverse dimension by 2.8 cm craniocaudal dimension.  The mass involves the deep and periventricular white matter and extends  along the ependymal lining of the left lateral ventricle involving the  trigone, temporal and occipital horn. Hyperperfusion is noted to the  peripheral enhancing portions of the mass. There is surrounding masslike  nonenhancing signal abnormality best seen on the prior MRI on FLAIR/T2  weighted images in the left parietal, temporal and  occipital lobes as well as extending across the splenium of the corpus  callosum. An additional lesion just posterior to the above-mentioned lesion  connected by the abnormal white matter signal to the larger mass measuring  1.1 cm x 0.6 cm is seen. There is also a separate left occipital mass  measuring 9 mm x 10 mm, which is also connected to the above-mentioned  lesions by T2/FLAIR signal abnormality is seen on the prior  study. Some  effacement of the trigone and  temporal horns of the left lateral ventricle with approximately 1-2 mm  rightward midline shift, unchanged from prior. 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      Assessment/Plan:       1. GBM (glioblastoma multiforme) (CMS [place]):  2. Encounter for chemotherapy management:    MGMT- Unmethylated  1p loss, 19q intact  Unresectable  Discussed the prior treatment paradigms of unresectable GBM with the patient and the brother today. The patient unfortunately does not understand much-and hence most of the  conversation was held with Carrie brother.  Recommend concurrent chemo -RT using Temodar as a radiosensitizer.  They understand that Temodar would be coming in from a [facility].  Printed information about Temodar was provided, anti emetics and Bactrim for PJP prophylaxis was sent to local preferred pharmacy.  Referral to Radiation Oncology was placed today. The logistics of daily radiation treatment were discussed with the brother- he promises adequate family support to ensure that she does not miss any appointments.  Detailed instructions were given on how to use antiemetics- preferably 30 minutes before Temodar intake.  He understands that the specialty pharmacist will also be calling them to explain some of the side effects and instructions regarding pill storage/interaction with food etc.  The need for weekly CBC while on concurrent chemo RT was also discussed.  The brother does understand that this proposed 6 weeks of concurrent chemo- RT is only the phase I of treatment.  He understands that a restaging brain MRI will be done a month after completion of concurrent chemo RT.  The poor prognosis associated with GBM was also discussed.  Clinical trial options was brought about-they are currently very overwhelmed and I shall continue to rediscuss this at our  subsequent visits. She will continue tapering steroids per neurosurgery instructions.    Orders Placed This Encounter   . IR US GUIDED BIOPSY   . temozolomide (TEMODAR) 5 mg Oral Capsule   . temozolomide (TEMODAR) 20 mg Oral Capsule   . temozolomide (TEMODAR) 100 mg Oral Capsule   . ondansetron (ZOFRAN ODT) 8 mg Oral Tablet, Rapid Dissolve   . trimethoprim-sulfamethoxazole (BACTRIM DS) 160-800mg  per tablet   . trimethoprim-sulfamethoxazole (BACTRIM DS) 160-800mg  per tablet     3. Seizure Disorder:  Continue with Keppra 1 g BID PO.  Will ensure outpatient neurology follow-Up.      4. Thyroid nodule    -TIRADS 4 nodule in the left lobe of the thyroid gland.   -[place] dated 09/20-shows a complex nodule in the left thyroid lobe measuring 15 x 12 x 13 mm.  The this is a mixed cystic and solid nodule which is hyper to isoechoic and taller than wider with smooth margins and calcifications-overall TI-RADS 4.  - IR US GUIDED BIOPSY; Future    RTC 3 weeks.    Luz Lex, MD    CC:  PCP General:  Kayleen Memos, MD  [address]  [place] [address]

## 2020-07-16 NOTE — Home Health (Signed)
Upon arrival patient seated at couch stating she is feeling well today.  Son reports many medical appts yesterday , patient was fatigued from, but has recovered some. Patient agreeable to tx this date, vitals assessed and WNL.  Patient tolerated standing ex at 10 reps ea with walker for support, requiring sbA-cgA for safety.  Educ to patient's caregiver regarding gt belt and use for safety with ambulation.  Patient tolerated outdoor ambulation as well as indoor at sbA-cgA with VCs for avoiding obstacles , proper striding with R passing L with each step and proper use of device to avoid lifting as patient was demonstrating with 2ww.  Patient able to roll walker more smoothly to allow for proper LE advance.  Patient fatigued with activities today with plan for next visit to continue to address step and ramp safety , ambulation for endurance, mobility and safety , as well as strengthening routine for increasing functional mobility and balance throughout home and navigating out for many dr appts required of patient due to current health concerns with poor prognosis.

## 2020-07-16 NOTE — Progress Notes (Incomplete)
[facility] [place] [address]  [id number]     Date: 07/16/2020  Patient Name: Carrie Escobar  MRN#: [id number]  DOB: 12/28/1945    Provider: Donny Pique, MD  PCP: Kayleen Memos, MD      Reason for visit: No chief complaint on file.    History:     Carrie Escobar is a 74 y.o. female here today as a hospital follow up for bradycardia.    She presented to [facility] on 06/27/20 with c/o fatigue and confusion. Workup showed multifocal intracranial masses. She was found to be bradycardiac while undergoing further treatment for intracranial mass. She was discharged 07/08/20.    Today she states    No c/o any CP, SOB, palpitations, syncope/presyncope, orthopnea/PND, LE edema, or claudication.    Past Medical History:     Past Medical History:   Diagnosis Date   . Acute metabolic encephalopathy 44/10/270   . Brain mass 06/29/2020    MRI: Multiple enhancing masses in the left parietal, temporal, and occipital lobes which are connected by masslike T2 FLAIR signal abnormality.  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  Largest mass extends along the ependymal lining of left lateral ventricle.   . Breast mass, right     benign   . Cancer (CMS [place])    . Cataract     bilat   . Diabetes mellitus, type 2 (CMS HCC)    . Embolism (CMS HCC)    . Family history of colon cancer    . Heartburn    . HTN (hypertension)    . Memory impairment 06/30/2020   . Nausea with vomiting     sister with PONV   . Obesity    . Pulmonary emboli     only once in 1999 after her hysterectomy   . Thyroid nodule     left    . Wears glasses     reading   . Weight loss, unintentional 07/22/2020         Past Surgical History:     Past Surgical History:   Procedure Laterality Date   . 20550 - inj single tendon sheath/ligament, aponeurosis w/ [place] interp only (amb only)  02/10/2016   . Colonoscopy  2016   . Hx breast biopsy Right    . Hx cataract removal   10/2013   . Hx colonoscopy     . Hx hysterectomy  1999   . Hx rotator cuff repair  11/04/2013     Allergies:     Allergies   Allergen Reactions   . Amoxicillin Rash       Medications:     No outpatient medications have been marked as taking for the 07/23/20 encounter (Appointment) with Donny Pique, MD.     Family History:     Family Medical History:     Problem Relation (Age of Onset)    Breast Cancer Maternal Grandmother    Cancer Mother, Sister    Diabetes Sister    Heart Attack Sister    Hypertension (High Blood Pressure) Mother, Sister    No Known Problems Brother, Maternal Grandfather, Paternal Grandmother, Paternal 29, Daughter, Son, Maternal Aunt, Maternal Uncle, Paternal 77, Paternal Uncle, Other    Stroke Mother, Father            Social History:     Social History     Socioeconomic History   . Marital status: Divorced  Spouse name: Not on file   . Number of children: 0   . Years of education: Not on file   . Highest education level: Not on file   Occupational History     Employer: [employer]   Tobacco Use   . Smoking status: Never Smoker   . Smokeless tobacco: Never Used   Vaping Use   . Vaping Use: Never used   Substance and Sexual Activity   . Alcohol use: No     Alcohol/week: 0.0 standard drinks   . Drug use: No   . Sexual activity: Not Currently   Other Topics Concern   . Abuse/Domestic Violence No   . Drives Yes   . Special Diet No   . Uses Cane No   . Uses walker No   . Uses wheelchair No   . Right hand dominant Yes   . Left hand dominant No   . Ability to Walk 1 Flight of Steps without SOB/CP Yes   . Routine Exercise Yes     Comment: Zumba class   . Ability to Walk 2 Flight of Steps without SOB/CP Yes   . Ability To Do Own ADL's Yes     Social Determinants of Health     Financial Resource Strain:    . Difficulty of Paying Living Expenses:    Food Insecurity:    . Worried About Charity fundraiser in the Last Year:    . Arboriculturist in the Last Year:    Transportation Needs:    . Lexicographer (Medical):    Marland Kitchen Lack of Transportation (Non-Medical):    Physical Activity:    . Days of Exercise per Week:    . Minutes of Exercise per Session:    Stress:    . Feeling of Stress :    Intimate Partner Violence:    . Fear of Current or Ex-Partner:    . Emotionally Abused:    Marland Kitchen Physically Abused:    . Sexually Abused:        Review of Systems:     All systems were reviewed and are negative other than noted in the HPI.    Physical Exam:   There were no vitals filed for this visit.      Cardiovascular:  RRR  No murmur  No rubs, clicks, or gallops    Pulse exam:  2+ radial pulses  2+ PT pulses    Extremities: Warm and well-perfused, no edema    Neck: Supple, no JVD, no bruits  General: Awake, alert, and in no acute distress  HEENT: Moist mucous membranes  Respiratory: Clear to auscultation  Abdominal/Gastrointestinal: Soft, non-tender, no masses, no organomegaly  Skin: Non jaundiced, no rashes  Neurological: Grossly nonfocal  Psychiatric: Normal affect    Cardiovascular Workup:     Echo 10/04/17  1. Normal left ventricular size and function. Moderate eccentric posterolateral mitral insufficiency present (this could be underestimated on a surface echocardiogram).  2. Normal pulmonary artery pressure.    Event monitor  In progress?    Assessment:     Carrie Escobar is a 74 y.o. female with    Bradycardia:   - event monitor in progress?    HTN: controlled today   - continue Norvasc 10 mg daily     Lipids: FLP 01/17/20 total 179, trig 101, HDL 73, LDL 86   - ASCVD 10 year risk 15.7%     DM II:  -  managed by PCP     Left deep temporal lesion:  - following with neurosurgery     Plan:     1. Encouraged heart healthy diet and exercise  2. Follow up in       Thank you for allowing me to participate in the care of your patient. Please feel free to contact me if there are further questions.     I am scribing for, and in the presence of, Dr. Donny Pique, MD for services provided on 07/23/2020.  Vickey Sages,  SCRIBE

## 2020-07-17 ENCOUNTER — Other Ambulatory Visit: Payer: Self-pay

## 2020-07-17 ENCOUNTER — Other Ambulatory Visit: Payer: Medicare PPO

## 2020-07-17 NOTE — Addendum Note (Signed)
Addended by: Luz Lex on: 07/17/2020 03:09 PM     Modules accepted: Orders

## 2020-07-17 NOTE — Home Health (Signed)
Upon arrival patient seated at couch stating she is feeling well today.  Patient agreeable to tx this date , vital assesed and WNL.  Patient tolerated 469ft ambulation outdoors with 2ww with obstacle challenges.  Patient tolerated well , with cues provided to increase safety and quality of gt and proper use of AD.  Patient often lifting AD rather than rolling AD, but able to correct with verbal cuing.  Patient tolerated increased reps with standing strengthening routine. Patient continues with poor awareness to obstacles before approaching, needing cues to recognize before hitting into object or divots in driveway.  Patient fatigued with activities today , demonstrating an endurance and strength deficit.  No LOB with dynamic activity challenges this date.  Plan for next visit to continue gt training with 2ww and cane if possible, strengthening and balance challenging per PT POC.

## 2020-07-20 ENCOUNTER — Other Ambulatory Visit: Payer: Medicare PPO

## 2020-07-20 ENCOUNTER — Encounter (HOSPITAL_COMMUNITY): Payer: Self-pay | Admitting: Radiology

## 2020-07-20 ENCOUNTER — Other Ambulatory Visit: Payer: Self-pay

## 2020-07-20 ENCOUNTER — Encounter (HOSPITAL_COMMUNITY): Payer: Self-pay | Admitting: Internal Medicine

## 2020-07-20 ENCOUNTER — Encounter (HOSPITAL_COMMUNITY): Payer: Self-pay

## 2020-07-20 NOTE — Home Health (Signed)
Upon arrival greeted at door by patient's caregiver stating this morning with medical appts, it was more exhausting then they had planned for .  Patient was reluctant to cancel PT today, stating she wants to do some, but advised patient we would keep it minimal to avoid over exertion and patient and caregiver agreeable and voiced their thanks to taking this into consideration.  Patient performed standing ex at a lesser rep due to overall fatigue.  Standing static balance performed well with no lob or wavering with EO/EC activities.  Patient's balance concerns appear to be more related to dynamic activity, dizziness and fatigue, as well as coordination issues at times.  Session ended with instruction for patient to rest some.  Plan for next visit to continue with gt training, balance , strengthening per PT POC.  Written HEP supplied for continuing between visits with caregivers assistance.

## 2020-07-20 NOTE — Nursing Note (Signed)
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 Oletta Darter. Patient was downstairs being simulated for radiation therapy. Mr. Linarez brought with him Temodar, bactrim and Zofran today to clinic.   Chemotherapy education binder which includes regimen information along with common symptoms and side effects, contact information for clinic, after hours numbers, Education officer, museum, triage nurse and navigator (myself). Reviewed how and when to take Temodar.  She will take Temodar only on days receiving radiation therapy; take on empty stomach; take 1 hour prior to arriving to radiation appt. [name] is aware not to crush, chew or break Temodar, use a gloved hand when handling the medication and have pt take med whole with a full glass of water.  Medication should be stored in a dry place at room temperature. Common side effects of nausea, vomiting, constipation, hair loss, diarrhea, headache, and low blood counts were discussed. Infection prevention and bleeding precautions discussed.  Mr. Barley is aware that if patient develops a fever of 100.38F or above he is to contact office, contact physician on call or take pt to ER immediately. Discussed potential for developing pneumocystis pneumonia and rx for Bactrim has been given prophylactically.  Reviewed this medication is to be taken on M, W, F's only once patient starts RT. Reviewed the Zofran and what it does.  He is aware to give to patient 1 hour prior to radiation treatment. If any side effects become unmanageable at home he will call the office.  This RN communicated to him that patient will need to have her labs drawn weekly once starting chemoRT and will need to keep all appointments.  All questions were answered to Mr. Farrugia satisfaction.  He expressed verbal understanding of all the above information. Mr.  Kalisz notes that he is patients' MPOA. Papers are on file.  He lives in [place], [place], but will be staying and caring for [place].

## 2020-07-20 NOTE — Nursing Note (Signed)
Patient in clinic to receive education for treatment. Per patients brother she received Temodar 100mg  and 20mg  caps from [employer] however she did not receive 5mg  caps. [employer] and spoke with pharmacist [place] who states he contacted Dr Oletta Darter who was agreeable in discontinuing 5mg  caps due to cost for patient and staying at recommended dose. Notified Manuela Schwartz - navigator who will inform patient/family

## 2020-07-21 ENCOUNTER — Other Ambulatory Visit: Payer: Self-pay

## 2020-07-21 ENCOUNTER — Other Ambulatory Visit: Payer: Medicare PPO

## 2020-07-21 NOTE — Home Health (Signed)
SN greeted by brother at door when arriving for visit.Pt. sitting on couch in living room.VS and assessment completed by SN.Pt. denies c/o pain or discomfort.Medications reviewed and all bottles seen,education on medications reviewed and understood by brother who gives pt. her medications.Pt. remains confused to place,time and situation,pt. very leasant.No seizures have been noted since admission to [facility] pt. continues Keppra twice a day.Pt. denies headaches or dizziness,has some c/o fatigue.Holtor monitor remains on per orders,denies c/o chest pain or discomfort,HR regular.Pt. has appointment with Crdioogist on 07/28/20 and Dr. Oletta Darter on 07/31/20.Reviewed authorizations with pt.and he understood.Next SN visit will be 07/23/20.

## 2020-07-22 ENCOUNTER — Other Ambulatory Visit: Payer: Medicare PPO

## 2020-07-23 ENCOUNTER — Other Ambulatory Visit: Payer: Medicare PPO

## 2020-07-23 ENCOUNTER — Other Ambulatory Visit: Payer: Self-pay

## 2020-07-23 ENCOUNTER — Encounter (INDEPENDENT_AMBULATORY_CARE_PROVIDER_SITE_OTHER): Payer: Self-pay | Admitting: INTERVENTIONAL CARDIOLOGY

## 2020-07-23 NOTE — Home Health (Signed)
SN greeted by brother at door when arriving  for visit.Pt. sitting on couch for visit.VS and assessment completed by SN. Pt denies c/o pain or discomfort.Medications reviewed and updated on POC.Pt. remains confused to place,time and place,oriented to person only,pt. pleasant and cooperative.Next SN visit will be next week,will decrease visits to once a week.

## 2020-07-23 NOTE — Home Health (Signed)
Upon arrival patient seated at couch  .  Greated by female caregiver.  patient's female caregiver present but in another room.  Patient agreeable to tx this date and vitals assessed and WNL.  Patient pleasant and stating today has been an okay day.  Ambulation training for endurance and gt quality outdoors today x 379ft with patient demonstrating good endurance for distance, short rest required when returning indoors, with vitals rechecked and WNL.  With activity patient relies heavily on cueing from clinician and reassurance of performing activity properly.  Patient responds to cues very well, but continues with need for repeat of cues and reassurance she is performing properly.  With obstacles, patient has a delayed response to adjusting AD to work around obstacle before upon it.  Patient provided cues early to challenge her decision making on how to adjust to the obstacle before its upon her.  Plan for next visit to continue with gt training for safety , strengthening BLEs and overall balance per PT POC.

## 2020-07-24 ENCOUNTER — Other Ambulatory Visit: Payer: Self-pay

## 2020-07-24 ENCOUNTER — Other Ambulatory Visit (HOSPITAL_COMMUNITY): Payer: Self-pay

## 2020-07-24 ENCOUNTER — Other Ambulatory Visit: Payer: Medicare PPO

## 2020-07-24 ENCOUNTER — Ambulatory Visit: Payer: Medicare PPO | Attending: Hematology & Oncology | Admitting: Hematology & Oncology

## 2020-07-24 DIAGNOSIS — C719 Malignant neoplasm of brain, unspecified: Secondary | ICD-10-CM | POA: Insufficient documentation

## 2020-07-26 ENCOUNTER — Other Ambulatory Visit: Payer: Self-pay

## 2020-07-27 ENCOUNTER — Other Ambulatory Visit: Payer: Medicare PPO

## 2020-07-28 ENCOUNTER — Other Ambulatory Visit (INDEPENDENT_AMBULATORY_CARE_PROVIDER_SITE_OTHER): Payer: Self-pay | Admitting: INTERNAL MEDICINE - CARDIOVASCULAR DISEASE

## 2020-07-28 ENCOUNTER — Other Ambulatory Visit: Payer: Self-pay

## 2020-07-28 ENCOUNTER — Encounter (INDEPENDENT_AMBULATORY_CARE_PROVIDER_SITE_OTHER): Payer: Self-pay | Admitting: INTERNAL MEDICINE - CARDIOVASCULAR DISEASE

## 2020-07-28 ENCOUNTER — Ambulatory Visit (INDEPENDENT_AMBULATORY_CARE_PROVIDER_SITE_OTHER): Payer: Medicare PPO | Admitting: INTERNAL MEDICINE - CARDIOVASCULAR DISEASE

## 2020-07-28 ENCOUNTER — Ambulatory Visit (HOSPITAL_COMMUNITY): Payer: Medicare PPO

## 2020-07-28 VITALS — BP 133/77 | HR 80 | Wt 139.0 lb

## 2020-07-28 DIAGNOSIS — E119 Type 2 diabetes mellitus without complications: Secondary | ICD-10-CM

## 2020-07-28 DIAGNOSIS — Z789 Other specified health status: Secondary | ICD-10-CM

## 2020-07-28 DIAGNOSIS — I10 Essential (primary) hypertension: Secondary | ICD-10-CM

## 2020-07-28 DIAGNOSIS — R9431 Abnormal electrocardiogram [ECG] [EKG]: Secondary | ICD-10-CM

## 2020-07-28 DIAGNOSIS — Z86711 Personal history of pulmonary embolism: Secondary | ICD-10-CM

## 2020-07-28 DIAGNOSIS — R001 Bradycardia, unspecified: Secondary | ICD-10-CM

## 2020-07-28 DIAGNOSIS — C719 Malignant neoplasm of brain, unspecified: Secondary | ICD-10-CM

## 2020-07-28 NOTE — Progress Notes (Signed)
Cardiology [place] [id number]  [phone]     Date: 07/28/2020  Patient Name: Carrie Escobar  MRN#: [id number]  DOB: [birth year]    Provider: Julian Hy, MD  PCP: Kayleen Memos, MD      Reason for visit: Follow Up    History:     Carrie Escobar is a 74 y.o. woman with a history of HTN, DM II, and h/o embolism. She was last seen by Dr. Marlon Pel during a hospital stay for multifocal brain massed that caused memory impairment and underwent a brain mass biopsy. During her admission she was found to be bradycardic and her EKG revealed delta waves so she was started on Keppra for seizure prophylaxis. She was discharged on Decadron taper, Keppra 1000 mg BID, and tramadol 50 mg QID. Biopsy revealed a grade 4 glioblastoma.     Today she reports feeling overall stable since her hospital discharge. She has no c/o any CP, SOB, palpitations, syncope/presyncope, orthopnea/PND, LE edema, or claudication. She is currently wearing her heart monitor and has about 1 week left. Her start date for chemo and radiation is undetermined but she is planning to contact Dr. Levada Schilling office later today to discuss this.   Past Medical History:     Past Medical History:   Diagnosis Date   . Acute metabolic encephalopathy 97/67/3419   . Brain mass 06/29/2020    MRI: Multiple enhancing masses in the left parietal, temporal, and occipital lobes which are connected by masslike T2 FLAIR signal abnormality.  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  Largest mass extends along the ependymal lining of left lateral ventricle.   . Breast mass, right     benign   . Cancer (CMS [facility])    . Cataract     bilat   . Diabetes mellitus, type 2 (CMS HCC)    . Embolism (CMS HCC)    . Family history of colon cancer    . Heartburn    . HTN (hypertension)    . Memory impairment 06/30/2020   . Nausea with vomiting     sister with PONV   . Obesity    .  Pulmonary emboli     only once in 1999 after her hysterectomy   . Thyroid nodule     left    . Wears glasses     reading   . Weight loss, unintentional 07/22/2020         Past Surgical History:     Past Surgical History:   Procedure Laterality Date   . 20550 - inj single tendon sheath/ligament, aponeurosis w/ [place] interp only (amb only)  02/10/2016   . Colonoscopy  2016   . Hx breast biopsy Right    . Hx cataract removal  10/2013   . Hx colonoscopy     . Hx hysterectomy  1999   . Hx rotator cuff repair  11/04/2013     Allergies:     Allergies   Allergen Reactions   . Amoxicillin Rash       Medications:     Outpatient Medications Marked as Taking for the 07/28/20 encounter (Office Visit) with Julian Hy, MD   Medication Sig   . amLODIPine (NORVASC) 10 mg Oral Tablet TAKE 1 TABLET BY MOUTH EVERY DAY   . aspirin (ECOTRIN) 81 mg Oral Tablet, Delayed Release (E.C.) Take 1 Tablet (81 mg total) by mouth Once a day   .  calcium citrate-vitamin D3 (CITRACAL) 200 mg-6.25 mcg (250 unit) Oral Tablet Take 1 Tablet by mouth Once a day   . dexAMETHasone (DECADRON) 2 mg Oral Tablet Take 2 tabs every 8 hours for 5 days; then 1 tab every 8 hours for 5 days; then 1 tab every 12 hours for 5 days; then 1 tab every day for 5 days and stop.   . Flaxseed Oil 1,000 mg Oral Capsule Take by mouth     . fluticasone propionate (FLONASE) 50 mcg/actuation Nasal Spray, Suspension 1 Spray by Each Nostril route Twice per day as needed   . levETIRAcetam (KEPPRA) 1,000 mg Oral Tablet Take 1 Tablet (1,000 mg total) by mouth Twice daily for 30 days   . metFORMIN (GLUCOPHAGE XR) 500 mg Oral Tablet Sustained Release 24 hr Take 1 Tablet (500 mg total) by mouth Once a day   . multivitamin Oral Tablet Take 1 Tab by mouth Once a day   . temozolomide (TEMODAR) 100 mg Oral Capsule Take 1 Capsule (100 mg total) by mouth Once a day for 42 days with 2 other temozolomide prescriptions for 130 mg total   . temozolomide (TEMODAR) 20 mg Oral Capsule Take 1 Capsule  (20 mg total) by mouth Once a day for 42 days with 2 other temozolomide prescriptions for 130 mg total   . temozolomide (TEMODAR) 5 mg Oral Capsule Take 2 Capsules (10 mg total) by mouth Once a day for 42 days with 2 other temozolomide prescriptions for 130 mg total (Patient taking differently: Take 10 mg by mouth Once a day not to start until radiation treatments begin and to take 1 hour before radiation treatment  Indications: metastatic malignant melanoma )   . timolol maleate (TIMOPTIC) 0.25 % Ophthalmic Drops Instill 1 Drop into both eyes Twice daily     . trimethoprim-sulfamethoxazole (BACTRIM DS) 160-800mg  per tablet Take 1 Tablet (160 mg total) by mouth Every Monday, Wednesday and Friday for 18 doses (Patient taking differently: Take 1 Tablet by mouth Every Monday, Wednesday and Friday. not to start until radiation treatments begin and then she takes it1 hour before radiation treaments on MWF  Indications: urinary tract infection prevention)   . trimethoprim-sulfamethoxazole (BACTRIM DS) 160-800mg  per tablet Take 1 Tablet (160 mg total) by mouth Every Monday, Wednesday and Friday for 18 doses     Family History:     Family Medical History:     Problem Relation (Age of Onset)    Breast Cancer Maternal Grandmother    Cancer Mother, Sister    Diabetes Sister    Heart Attack Sister    Hypertension (High Blood Pressure) Mother, Sister    No Known Problems Brother, Maternal Grandfather, Paternal Grandmother, Paternal 50, Daughter, Son, Maternal 42, Maternal Uncle, Paternal 39, Paternal Uncle, Other    Stroke Mother, Father            Social History:     Social History     Socioeconomic History   . Marital status: Divorced     Spouse name: Not on file   . Number of children: 0   . Years of education: Not on file   . Highest education level: Not on file   Occupational History     Employer: [employer]   Tobacco Use   . Smoking status: Never Smoker   . Smokeless tobacco: Never Used   Vaping Use   . Vaping Use:  Never used   Substance and Sexual Activity   . Alcohol use: No  Alcohol/week: 0.0 standard drinks   . Drug use: No   . Sexual activity: Not Currently   Other Topics Concern   . Abuse/Domestic Violence No   . Drives Yes   . Special Diet No   . Uses Cane No   . Uses walker No   . Uses wheelchair No   . Right hand dominant Yes   . Left hand dominant No   . Ability to Walk 1 Flight of Steps without SOB/CP Yes   . Routine Exercise Yes     Comment: Zumba class   . Ability to Walk 2 Flight of Steps without SOB/CP Yes   . Ability To Do Own ADL's Yes     Social Determinants of Health     Financial Resource Strain:    . Difficulty of Paying Living Expenses:    Food Insecurity:    . Worried About Charity fundraiser in the Last Year:    . Arboriculturist in the Last Year:    Transportation Needs:    . Film/video editor (Medical):    Marland Kitchen Lack of Transportation (Non-Medical):    Physical Activity:    . Days of Exercise per Week:    . Minutes of Exercise per Session:    Stress:    . Feeling of Stress :    Intimate Partner Violence:    . Fear of Current or Ex-Partner:    . Emotionally Abused:    Marland Kitchen Physically Abused:    . Sexually Abused:        Review of Systems:     All systems were reviewed and are negative other than noted in the HPI.    Physical Exam:     Vitals:    07/28/20 1006   BP: 133/77   Pulse: 80   SpO2: 96%   Weight: 63 kg (139 lb)         Cardiovascular:  RRR  No murmur  No rubs, clicks, or gallops    Pulse exam:  2+ radial pulses  2+ PT pulses    Extremities: Warm and well-perfused, no edema    Neck: Supple, no JVD, no bruits  General: Awake, alert, and in no acute distress  HEENT: Moist mucous membranes  Respiratory: Clear to auscultation  Abdominal/Gastrointestinal: Soft, non-tender, no masses, no organomegaly  Skin: Non jaundiced, no rashes  Neurological: Grossly nonfocal  Psychiatric: Normal affect    Cardiovascular Workup:     Echo 10/04/17   1. Normal left ventricular size and function. Moderate  eccentric posterolateral mitral insufficiency present  (this could be underestimated on a surface echocardiogram).  2. Normal pulmonary artery pressure.    Brain MRI 06/27/20   Multiple enhancing masses in the left parietal, temporal and occipital  lobes which are connected by masslike T2/FLAIR signal abnormality which  also extends into the splenium of the corpus callosum. 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 The largest mass extends along the  ependymal lining of the left lateral ventricle.  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    Assessment:     Carrie Escobar is a 74 y.o. woman with    EKG changes: Delta waves seen on EKG   - on Keppra     Brain masses:   - biopsy revealed grade 4 glioblastoma   - still waiting on start date for radiation and chemo     Bradycardia: HR today 80bpm   - occurs at night  HTN: controlled today   - goal BP <130/80   - on Norvasc    Lipids: FLP 179, trig 101, HDL 73, LDL 86     H/o PE: in 1999 after hysterectomy    DM II:      Nonsmoker:     Plan:     1. Encouraged heart healthy diet and exercise  2. Will order an echo to reassess her LVEF  3. Continue current medications  4. Follow up in 2 weeks       Thank you for allowing me to participate in the care of your patient. Please feel free to contact me if there are further questions.     I am scribing for, and in the presence of, Dr. Julian Hy, MD for services provided on 07/28/2020.  Shiloh Huntsberry, SCRIBE  Shiloh Versailles, SCRIBE  07/28/2020, 10:19      I personally performed the services described in this documentation, as scribed in my presence, and it is both accurate and complete.    Julian Hy, MD    Julian Hy, MD  07/28/2020, 10:22

## 2020-07-29 ENCOUNTER — Other Ambulatory Visit: Payer: Medicare PPO

## 2020-07-29 ENCOUNTER — Ambulatory Visit (HOSPITAL_BASED_OUTPATIENT_CLINIC_OR_DEPARTMENT_OTHER)
Admission: RE | Admit: 2020-07-29 | Discharge: 2020-07-29 | Disposition: A | Discharge status: Home / Self Care | Payer: Medicare PPO | Source: Ambulatory Visit | Attending: INTERNAL MEDICINE - CARDIOVASCULAR DISEASE | Admitting: INTERNAL MEDICINE - CARDIOVASCULAR DISEASE

## 2020-07-29 ENCOUNTER — Other Ambulatory Visit: Payer: Self-pay

## 2020-07-29 ENCOUNTER — Emergency Department (HOSPITAL_COMMUNITY): Payer: Medicare PPO

## 2020-07-29 ENCOUNTER — Inpatient Hospital Stay
Admission: EM | Admit: 2020-07-29 | Discharge: 2020-08-02 | DRG: 176 | Disposition: A | Discharge status: Home / Self Care | Payer: Medicare PPO | Attending: Internal Medicine | Admitting: Internal Medicine

## 2020-07-29 ENCOUNTER — Telehealth: Payer: Self-pay

## 2020-07-29 ENCOUNTER — Other Ambulatory Visit (INDEPENDENT_AMBULATORY_CARE_PROVIDER_SITE_OTHER): Payer: Self-pay | Admitting: HOSPITALIST

## 2020-07-29 DIAGNOSIS — E119 Type 2 diabetes mellitus without complications: Secondary | ICD-10-CM | POA: Diagnosis present

## 2020-07-29 DIAGNOSIS — I10 Essential (primary) hypertension: Secondary | ICD-10-CM | POA: Diagnosis present

## 2020-07-29 DIAGNOSIS — C711 Malignant neoplasm of frontal lobe: Secondary | ICD-10-CM | POA: Diagnosis present

## 2020-07-29 DIAGNOSIS — Z20822 Contact with and (suspected) exposure to covid-19: Secondary | ICD-10-CM | POA: Diagnosis present

## 2020-07-29 DIAGNOSIS — C713 Malignant neoplasm of parietal lobe: Secondary | ICD-10-CM | POA: Diagnosis present

## 2020-07-29 DIAGNOSIS — Z86711 Personal history of pulmonary embolism: Secondary | ICD-10-CM

## 2020-07-29 DIAGNOSIS — C719 Malignant neoplasm of brain, unspecified: Secondary | ICD-10-CM | POA: Diagnosis present

## 2020-07-29 DIAGNOSIS — D72829 Elevated white blood cell count, unspecified: Secondary | ICD-10-CM | POA: Diagnosis present

## 2020-07-29 DIAGNOSIS — R001 Bradycardia, unspecified: Secondary | ICD-10-CM

## 2020-07-29 DIAGNOSIS — I361 Nonrheumatic tricuspid (valve) insufficiency: Secondary | ICD-10-CM

## 2020-07-29 DIAGNOSIS — I2699 Other pulmonary embolism without acute cor pulmonale: Principal | ICD-10-CM | POA: Diagnosis present

## 2020-07-29 DIAGNOSIS — R269 Unspecified abnormalities of gait and mobility: Secondary | ICD-10-CM | POA: Diagnosis not present

## 2020-07-29 DIAGNOSIS — R413 Other amnesia: Secondary | ICD-10-CM | POA: Diagnosis present

## 2020-07-29 DIAGNOSIS — Z79899 Other long term (current) drug therapy: Secondary | ICD-10-CM

## 2020-07-29 DIAGNOSIS — Z800 Family history of malignant neoplasm of digestive organs: Secondary | ICD-10-CM

## 2020-07-29 DIAGNOSIS — C714 Malignant neoplasm of occipital lobe: Secondary | ICD-10-CM | POA: Diagnosis present

## 2020-07-29 DIAGNOSIS — E871 Hypo-osmolality and hyponatremia: Secondary | ICD-10-CM | POA: Diagnosis present

## 2020-07-29 LAB — CBC WITH DIFF
BASOPHIL #: 0 10*3/uL (ref 0.00–0.10)
BASOPHIL %: 0 % (ref 0–3)
EOSINOPHIL #: 0 10*3/uL (ref 0.00–0.50)
EOSINOPHIL %: 0 % (ref 0–5)
HCT: 42.3 % (ref 36.0–45.0)
HGB: 14.1 g/dL (ref 12.0–15.5)
LYMPHOCYTE #: 1.1 10*3/uL (ref 1.00–4.80)
LYMPHOCYTE %: 7 % — ABNORMAL LOW (ref 15–43)
MCH: 28.7 pg (ref 27.5–33.2)
MCHC: 33.2 g/dL (ref 32.0–36.0)
MCV: 86.3 fL (ref 82.0–97.0)
MONOCYTE #: 1.1 10*3/uL — ABNORMAL HIGH (ref 0.20–0.90)
MONOCYTE %: 6 % (ref 5–12)
MPV: 8.7 fL (ref 7.4–10.5)
NEUTROPHIL #: 15 10*3/uL — ABNORMAL HIGH (ref 1.50–6.50)
NEUTROPHIL %: 87 % — ABNORMAL HIGH (ref 43–76)
PLATELETS: 152 10*3/uL (ref 150–450)
RBC: 4.91 10*6/uL (ref 4.00–5.10)
RDW: 15.1 % (ref 11.0–16.0)
WBC: 17.3 10*3/uL — ABNORMAL HIGH (ref 4.0–11.0)

## 2020-07-29 LAB — COMPREHENSIVE METABOLIC PANEL, NON-FASTING
ALBUMIN: 4.1 g/dL (ref 3.4–4.8)
ALKALINE PHOSPHATASE: 63 U/L (ref 55–145)
ALT (SGPT): 38 U/L — ABNORMAL HIGH (ref 8–22)
ANION GAP: 13 mmol/L (ref 4–13)
AST (SGOT): 27 U/L (ref 8–45)
BILIRUBIN TOTAL: 0.7 mg/dL (ref 0.3–1.3)
BUN/CREA RATIO: 13 (ref 6–22)
BUN: 12 mg/dL (ref 8–25)
CALCIUM: 10 mg/dL (ref 8.8–10.2)
CHLORIDE: 96 mmol/L (ref 96–111)
CO2 TOTAL: 24 mmol/L (ref 23–31)
CREATININE: 0.92 mg/dL (ref 0.60–1.05)
ESTIMATED GFR: 72 mL/min/BSA (ref >=60)
GLUCOSE: 186 mg/dL — ABNORMAL HIGH (ref 65–125)
POTASSIUM: 4.1 mmol/L (ref 3.5–5.1)
PROTEIN TOTAL: 7.3 g/dL (ref 6.0–8.0)
SODIUM: 133 mmol/L — ABNORMAL LOW (ref 136–145)

## 2020-07-29 LAB — TROPONIN-I: TROPONIN I: <7 ng/L — ABNORMAL LOW (ref 7–30)

## 2020-07-29 LAB — B-TYPE NATRIURETIC PEPTIDE (BNP),PLASMA: BNP: 12 pg/mL (ref <=99)

## 2020-07-29 MED ORDER — ONDANSETRON HCL (PF) 4 MG/2 ML INJECTION SOLUTION
4.0000 mg | Freq: Four times a day (QID) | INTRAMUSCULAR | Status: DC | PRN
Start: 2020-07-29 — End: 2020-08-02

## 2020-07-29 MED ORDER — DEXTROSE 50 % IN WATER (D50W) INTRAVENOUS SYRINGE
12.5000 g | INJECTION | INTRAVENOUS | Status: DC | PRN
Start: 2020-07-29 — End: 2020-08-02

## 2020-07-29 MED ORDER — CALCIUM 500 MG (AS CARBONATE)-VITAMIN D3 5 MCG (200 UNIT) TABLET
1.0000 | ORAL_TABLET | Freq: Every day | ORAL | Status: DC
Start: 2020-07-30 — End: 2020-08-02
  Administered 2020-07-30 – 2020-08-02 (×4): 1 via ORAL
  Filled 2020-07-29 (×4): qty 1

## 2020-07-29 MED ORDER — INSULIN LISPRO 100 UNIT/ML INJECTION SSIP - CITY
1.0000 [IU] | Freq: Four times a day (QID) | SUBCUTANEOUS | Status: DC
Start: 2020-07-30 — End: 2020-08-02
  Administered 2020-07-30: 1 [IU] via SUBCUTANEOUS
  Administered 2020-07-30: 0 [IU] via SUBCUTANEOUS
  Administered 2020-07-30 – 2020-07-31 (×3): 1 [IU] via SUBCUTANEOUS
  Administered 2020-07-31: 5 [IU] via SUBCUTANEOUS
  Administered 2020-07-31: 1 [IU] via SUBCUTANEOUS
  Administered 2020-08-01: 5 [IU] via SUBCUTANEOUS
  Administered 2020-08-01: 3 [IU] via SUBCUTANEOUS
  Administered 2020-08-01: 11:00:00 1 [IU] via SUBCUTANEOUS
  Administered 2020-08-01: 3 [IU] via SUBCUTANEOUS
  Administered 2020-08-02: 1 [IU] via SUBCUTANEOUS
  Administered 2020-08-02: 7 [IU] via SUBCUTANEOUS
  Filled 2020-07-29 (×2): qty 300

## 2020-07-29 MED ORDER — DEXAMETHASONE 4 MG TABLET
4.0000 mg | ORAL_TABLET | Freq: Two times a day (BID) | ORAL | Status: DC
Start: 2020-07-30 — End: 2020-08-02
  Administered 2020-07-30 – 2020-08-01 (×7): 4 mg via ORAL
  Filled 2020-07-29 (×12): qty 1

## 2020-07-29 MED ORDER — TRAMADOL 50 MG TABLET
50.0000 mg | ORAL_TABLET | Freq: Four times a day (QID) | ORAL | Status: DC | PRN
Start: 2020-07-29 — End: 2020-08-02

## 2020-07-29 MED ORDER — HEPARIN (PORCINE) 5,000 UNITS/ML BOLUS
4000.0000 [IU] | Freq: Once | INTRAMUSCULAR | Status: AC
Start: 2020-07-29 — End: 2020-07-29
  Administered 2020-07-29: 4000 [IU] via INTRAVENOUS
  Filled 2020-07-29: qty 1

## 2020-07-29 MED ORDER — NITROGLYCERIN 0.4 MG SUBLINGUAL TABLET
0.4000 mg | SUBLINGUAL_TABLET | SUBLINGUAL | Status: DC | PRN
Start: 2020-07-29 — End: 2020-08-02

## 2020-07-29 MED ORDER — TIMOLOL MALEATE 0.25 % EYE DROPS
1.0000 [drp] | Freq: Two times a day (BID) | OPHTHALMIC | Status: DC
Start: 2020-07-30 — End: 2020-08-02
  Administered 2020-07-30 – 2020-08-02 (×7): 1 [drp] via OPHTHALMIC
  Filled 2020-07-29 (×2): qty 10

## 2020-07-29 MED ORDER — SODIUM CHLORIDE 0.9 % (FLUSH) INJECTION SYRINGE
10.0000 mL | INJECTION | Freq: Three times a day (TID) | INTRAMUSCULAR | Status: DC
Start: 2020-07-30 — End: 2020-08-02
  Administered 2020-07-30 (×3): 10 mL via INTRAVENOUS
  Administered 2020-07-31 (×3): 0 mL via INTRAVENOUS
  Administered 2020-08-01: 10 mL via INTRAVENOUS
  Administered 2020-08-01: 0 mL via INTRAVENOUS
  Administered 2020-08-01: 10 mL via INTRAVENOUS
  Administered 2020-08-01 – 2020-08-02 (×2): 0 mL via INTRAVENOUS

## 2020-07-29 MED ORDER — LEVETIRACETAM 500 MG TABLET
1000.0000 mg | ORAL_TABLET | Freq: Two times a day (BID) | ORAL | Status: DC
Start: 2020-07-30 — End: 2020-08-02
  Administered 2020-07-30 – 2020-08-02 (×8): 1000 mg via ORAL
  Filled 2020-07-29 (×8): qty 2

## 2020-07-29 MED ORDER — SODIUM CHLORIDE 0.9% FLUSH BAG - 250 ML
INTRAVENOUS | Status: DC | PRN
Start: 2020-07-29 — End: 2020-08-02

## 2020-07-29 MED ORDER — AMLODIPINE 5 MG TABLET
10.0000 mg | ORAL_TABLET | Freq: Every day | ORAL | Status: DC
Start: 2020-07-30 — End: 2020-08-02
  Administered 2020-07-30 – 2020-08-02 (×4): 10 mg via ORAL
  Filled 2020-07-29 (×4): qty 2

## 2020-07-29 MED ORDER — ACETAMINOPHEN 325 MG TABLET
650.0000 mg | ORAL_TABLET | Freq: Four times a day (QID) | ORAL | Status: DC | PRN
Start: 2020-07-29 — End: 2020-08-02

## 2020-07-29 MED ORDER — SODIUM CHLORIDE 0.9 % (FLUSH) INJECTION SYRINGE
10.0000 mL | INJECTION | INTRAMUSCULAR | Status: DC | PRN
Start: 2020-07-29 — End: 2020-08-02

## 2020-07-29 MED ORDER — HEPARIN (PORCINE) 25,000 UNIT/250ML IN 0.45 % SODIUM CHLORIDE (PROCEDURAL AREA)
17.2400 [IU]/kg/h | INTRAVENOUS | Status: DC
Start: 2020-07-30 — End: 2020-07-29
  Administered 2020-07-29: 0 [IU]/kg/h via INTRAVENOUS
  Administered 2020-07-29: 17.24 [IU]/kg/h via INTRAVENOUS
  Filled 2020-07-29: qty 250

## 2020-07-29 MED ORDER — IOPAMIDOL 370 MG IODINE/ML (76 %) INTRAVENOUS SOLUTION
100.0000 mL | INTRAVENOUS | Status: AC
Start: 2020-07-29 — End: 2020-07-29
  Administered 2020-07-29: 75 mL via INTRAVENOUS
  Filled 2020-07-29: qty 100

## 2020-07-29 MED ORDER — FLUTICASONE PROPIONATE 50 MCG/ACTUATION NASAL SPRAY,SUSPENSION
1.0000 | Freq: Every day | NASAL | Status: DC
Start: 2020-07-30 — End: 2020-08-02
  Administered 2020-07-30: 1 via NASAL
  Administered 2020-07-31 – 2020-08-01 (×2): 0 via NASAL
  Administered 2020-08-02: 10:00:00 1 via NASAL
  Filled 2020-07-29 (×2): qty 16

## 2020-07-29 MED ORDER — LORAZEPAM 2 MG/ML INJECTION SOLUTION
0.5000 mg | INTRAMUSCULAR | Status: DC | PRN
Start: 2020-07-29 — End: 2020-08-02

## 2020-07-29 MED ORDER — HEPARIN (PORCINE) 25,000 UNIT/250 ML IN 0.45 % SODIUM CHLORIDE IV SOLN
17.2400 [IU]/kg/h | INTRAVENOUS | Status: DC
Start: 2020-07-30 — End: 2020-08-01
  Administered 2020-07-29: 17.24 [IU]/kg/h via INTRAVENOUS
  Administered 2020-07-30: 0 [IU]/kg/h via INTRAVENOUS
  Administered 2020-07-30: 16:00:00 15.2 [IU]/kg/h via INTRAVENOUS
  Administered 2020-07-30: 12:00:00 0 [IU]/kg/h via INTRAVENOUS
  Administered 2020-07-30: 14.24 [IU]/kg/h via INTRAVENOUS
  Administered 2020-07-30: 14.19 [IU]/kg/h via INTRAVENOUS
  Administered 2020-07-30: 14.24 [IU]/kg/h via INTRAVENOUS
  Administered 2020-07-31 – 2020-08-01 (×3): 12.19 [IU]/kg/h via INTRAVENOUS
  Filled 2020-07-29: qty 250

## 2020-07-29 MED ORDER — ASPIRIN 81 MG TABLET,DELAYED RELEASE
81.0000 mg | DELAYED_RELEASE_TABLET | Freq: Every day | ORAL | Status: DC
Start: 2020-07-30 — End: 2020-08-02
  Administered 2020-07-30 – 2020-08-02 (×4): 81 mg via ORAL
  Filled 2020-07-29 (×4): qty 1

## 2020-07-29 MED ORDER — MORPHINE 4 MG/ML INTRAVENOUS SOLUTION
4.0000 mg | INTRAVENOUS | Status: DC | PRN
Start: 2020-07-29 — End: 2020-08-02
  Administered 2020-07-30 (×2): 4 mg via INTRAVENOUS
  Filled 2020-07-29 (×2): qty 1

## 2020-07-29 NOTE — Home Health (Signed)
Upon arrival patient in living room.  Brother to patient , PCG stating this morning was rough for patient.  She reported headache and nausea which since subsided since taking medications.  Brother also stating patient has not been willing to eat much today, but going to try now that she is feeling better some. Patient agreeable to tx this date, but was noticably fatigued and /or not feeling well. Patient denies no headache this afternoon and vitals wnl.  patient tolerated standing ex at counter with plan for balance training, but due to fatigue level and difficulty with processing cues through standing routine, clinician found it best to end session as patient appearing cognitively fatigued as well.  During visit call received stating chemo to start tomorrow at 215pm.  All disciplines made away.  Patient's son aware next visit with Earley Favor for tomorrow for reassessment.

## 2020-07-29 NOTE — Telephone Encounter (Signed)
Patient is to start radiation tomorrow. Spoke with patient's brother and advised that she is to begin temodar and Zofran tomorrw. She can start bactrim on Friday to be on the prescribed M, W, F schedule. All questions answered. Understanding verbalized.

## 2020-07-29 NOTE — ED Provider Notes (Addendum)
Carrie Fiedler, MD        [facility]          Emergency Department Visit Note    Date of Service: 07/29/2020  Room: ER21/21  Primary Care Doctor:Mehran Tempie Donning, MD    Chief Complaint:No chief complaint on file.      HPI:  The patient is a(an) 74 y.o. female who presents to the Emergency Department with chest pain but the patient is unable to give me a story.  The brother is here and he notes that she has recently been diagnosed with the stage IV brain tumor and is due to have chemotherapy as well as radiation therapy for the 1st time tomorrow at 1:00 p.m. and 2:00 p.m. respectively.  She apparently was going to the restroom and did not quite make it and soiled her pants but after she cleaned herself up according to the son she grabbed her chest and said she was having chest pain.  Because of the persistence he brought her in EMS administered aspirin and nitroglycerin and when I arrived she did not complain of any pain but she was confused which I am told is her baseline secondary to her brain tumor.  The patient did not fall she has not had nausea vomiting and the brother states that he thinks she had some vomiting of blood on her last admission a month ago but I do not see evidence of that on the discharge summary.    This patient is unable to provide a history and therefore a caveat I should be provided    Review of Systems:  The pertinent positives and negatives are as per history of present illness. All other systems reviewed, unless otherwise noted are negative.     Past Medical History:  Past Medical History:   Diagnosis Date    Acute metabolic encephalopathy 14/43/1540    Brain mass 06/29/2020    MRI: Multiple enhancing masses in the left parietal, temporal, and occipital lobes which are connected by masslike T2 FLAIR signal abnormality.  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  Largest mass extends along the ependymal lining of left lateral ventricle.    Breast mass, right      benign    Cancer (CMS HCC)     Cataract     bilat    Diabetes mellitus, type 2 (CMS [place])     Embolism (CMS HCC)     Family history of colon cancer     Heartburn     HTN (hypertension)     Memory impairment 06/30/2020    Nausea with vomiting     sister with PONV    Obesity     Pulmonary emboli     only once in 1999 after her hysterectomy    Thyroid nodule     left     Wears glasses     reading    Weight loss, unintentional 07/22/2020           Past Surgical History:  Past Surgical History:   Procedure Laterality Date    20550 - INJ SINGLE TENDON SHEATH/LIGAMENT, APONEUROSIS W/ [place] INTERP ONLY (AMB ONLY)  02/10/2016    COLONOSCOPY  2016    HX BREAST BIOPSY Right     BENIGN     HX CATARACT REMOVAL  10/2013    left    HX COLONOSCOPY      HX HYSTERECTOMY  1999    fibroid    HX ROTATOR CUFF REPAIR  11/04/2013    right shoulder           Social History:  Social History     Tobacco Use    Smoking status: Never Smoker    Smokeless tobacco: Never Used   Vaping Use    Vaping Use: Never used   Substance Use Topics    Alcohol use: No     Alcohol/week: 0.0 standard drinks    Drug use: No     Social History     Substance and Sexual Activity   Drug Use No       Current Outpatient Medications:   No outpatient medications have been marked as taking for the 07/29/20 encounter [facility] Encounter).        Allergies:   Allergies   Allergen Reactions    Amoxicillin Rash       Physical Exam     Initial Vital Signs:  ED Triage Vitals   BP (Non-Invasive) 07/29/20 2045 (!) 164/85   Heart Rate 07/29/20 2100 84   Respiratory Rate 07/29/20 2100 (!) 24   Temperature 07/29/20 2101 36.1 C (96.9 F)   SpO2 07/29/20 2100 99 %   Weight 07/29/20 2101 63 kg (138 lb 14.2 oz)   Height 07/29/20 2101 1.626 m (5\' 4" )       Constitutional:  Appearance is consistent with stated age above.  HEENT: Atraumatic, normocephalic head. Pupils equal and round, reactive to light. No scleral icterus. Normal conjunctiva. TM's are clear. Mucous  membranes are moist. Nares unremarkable. Oropharynx shows no erythema or exudate.  Neck: No JVD. No thyromegaly. No lymphadenopathy. Supple.   Lungs: Clear to auscultation bilaterally.   Cardiovascular: Heart is S1-S2 regular rate without murmur, click, or rub.  Chest/Back/Musculoskeletal: No midline or paraspinal muscle tenderness to palpation. No step-off.   Abdomen:  Soft, non-distended. No tenderness to palpation without evidence of rebound or guarding. No pulsatile masses. No organomegaly.   Genitourinary: No CVA tenderness to palpation.  Extremities: No acute tenderness to palpation, deformity, or abnormality of ROM. No calf tenderness. No pitting edema.  Skin: Warm and dry. No cyanosis, jaundice, rash or lesion.  Neurologic: Alert and oriented x 3. Normal facial symmetry and speech. Normal upper and lower extremity strength. Grossly normal sensation. Cranial nerves II- VII intact.   Lymphatics: No lymphadenopathy.  Vascular: Normal peripheral pulses with brisk capillary refills of less than 2 seconds.    Diagnostics     Labs:    Results for orders placed or performed during the hospital encounter of 07/29/20 (from the past 24 hour(s))   CBC/DIFF    Narrative    The following orders were created for panel order CBC/DIFF.  Procedure                               Abnormality         Status                     ---------                               -----------         ------                     CBC WITH DIFF[[id number]]  Abnormal            Final result                 Please view results for these tests on the individual orders.   COMPREHENSIVE METABOLIC PANEL, NON-FASTING   Result Value Ref Range    SODIUM 133 (L) 136 - 145 mmol/L    POTASSIUM 4.1 3.5 - 5.1 mmol/L    CHLORIDE 96 96 - 111 mmol/L    CO2 TOTAL 24 23 - 31 mmol/L    ANION GAP 13 4 - 13 mmol/L    BUN 12 8 - 25 mg/dL    CREATININE 0.92 0.60 - 1.05 mg/dL    BUN/CREA RATIO 13 6 - 22    ESTIMATED GFR 72 >=60 mL/min/BSA    ALBUMIN 4.1 3.4 -  4.8 g/dL     CALCIUM 10.0 8.8 - 10.2 mg/dL    GLUCOSE 186 (H) 65 - 125 mg/dL    ALKALINE PHOSPHATASE 63 55 - 145 U/L    ALT (SGPT) 38 (H) 8 - 22 U/L    AST (SGOT)  27 8 - 45 U/L    BILIRUBIN TOTAL 0.7 0.3 - 1.3 mg/dL    PROTEIN TOTAL 7.3 6.0 - 8.0 g/dL   TROPONIN-I   Result Value Ref Range    TROPONIN I <7 (L) 7 - 30 ng/L   B-TYPE NATRIURETIC PEPTIDE   Result Value Ref Range    BNP 12 <=99 pg/mL   CBC WITH DIFF   Result Value Ref Range    WBC 17.3 (H) 4.0 - 11.0 x103/uL    RBC 4.91 4.00 - 5.10 x106/uL    HGB 14.1 12.0 - 15.5 g/dL    HCT 42.3 36.0 - 45.0 %    MCV 86.3 82.0 - 97.0 fL    MCH 28.7 27.5 - 33.2 pg    MCHC 33.2 32.0 - 36.0 g/dL    RDW 15.1 11.0 - 16.0 %    PLATELETS 152 150 - 450 x103/uL    MPV 8.7 7.4 - 10.5 fL    NEUTROPHIL % 87 (H) 43 - 76 %    LYMPHOCYTE % 7 (L) 15 - 43 %    MONOCYTE % 6 5 - 12 %    EOSINOPHIL % 0 0 - 5 %    BASOPHIL % 0 0 - 3 %    NEUTROPHIL # 15.00 (H) 1.50 - 6.50 x103/uL    LYMPHOCYTE # 1.10 1.00 - 4.80 x103/uL    MONOCYTE # 1.10 (H) 0.20 - 0.90 x103/uL    EOSINOPHIL # 0.00 0.00 - 0.50 x103/uL    BASOPHIL # 0.00 0.00 - 0.10 x103/uL   COVID-19 SCREENING (COVID only)   Result Value Ref Range    SARS-CoV-2 Not Detected Not Detected    Narrative    Results are for the qualitative identification of SARS-CoV-2 (formerly 2019-nCoV) RNA. The SARS-CoV-2 RNA is generally detectable in nasopharyngeal swabs and nasal wash/aspirates during the ACUTE PHASE of infection. Hence, this test is intended to be performed on respiratory specimens collected from individuals who meet Centers for Disease Control and Prevention (CDC) clinical and/or epidemiological criteria for Coronavirus Disease 2019 (COVID-19) testing. CDC COVID-19 criteria for testing on human specimens are available at [facility] webpage information for Healthcare Professionals: Coronavirus Disease 2019 (COVID-19) ([email or web address]).    Disclaimer:  This assay has been authorized by FDA under  an Emergency Use Authorization for use in laboratories certified under the Clinical Laboratory Improvement  Amendments of 1988 (CLIA), 42 U.S.C. [phone], to perform high complexity tests. The impacts of vaccines, antiviral therapeutics, antibiotics, chemotherapeutic or immunosuppressant drugs have not been evaluated.    Test methodology:   Cepheid Xpert Xpress SARS-CoV-2 Assay real-time polymerase chain reaction (RT-PCR) test on the GeneXpert Dx system.   PTT (PARTIAL THROMBOPLASTIN TIME)   Result Value Ref Range    APTT 23.7 (L) 25.1 - 36.5 seconds    Narrative    Therapeutic range for unfractionated heparin is 50.0-90.0 seconds.     PT/INR   Result Value Ref Range    PROTHROMBIN TIME 14.4 (H) 9.4 - 12.5 seconds    INR 1.24     Narrative    Coumadin therapy INR range for Conventional Anticoagulation is 2.0 to 3.0 and for Intensive Anticoagulation 2.5 to 3.5.   CBC/DIFF    Narrative    The following orders were created for panel order CBC/DIFF.  Procedure                               Abnormality         Status                     ---------                               -----------         ------                     CBC WITH DIFF[[id number]]                                                                 Please view results for these tests on the individual orders.     Labs reviewed by me.    EKG interpretation:  Normal sinus rhythm a rate of 81 leftward axis left ventricular hypertrophy no acute findings.    Radiology:  XR AP MOBILE CHEST    (Results Pending)   CTA CHEST FOR PULMONARY EMBOLUS AND CT ABD/PEL W IV CONTRAST    (Results Pending)     interpreted by radiologist and independently reviewed by me.      ED Course     The problem list, past medical, past surgical, medication and allergy history reviewed.      Initial orders placed:   Orders Placed This Encounter    XR AP MOBILE CHEST    CTA CHEST FOR PULMONARY EMBOLUS AND CT ABD/PEL W IV CONTRAST    CBC/DIFF    COMPREHENSIVE METABOLIC PANEL, NON-FASTING     TROPONIN-I    B-TYPE NATRIURETIC PEPTIDE    LEVETIRACETAM, SERUM    CBC WITH DIFF    COVID-19 SCREENING (COVID only)    PTT (PARTIAL THROMBOPLASTIN TIME)    PT/INR    CBC    PTT (PARTIAL THROMBOPLASTIN TIME)    THYROID STIMULATING HORMONE WITH FREE T4 REFLEX    CBC/DIFF    BASIC METABOLIC PANEL    PT/INR    PTT (PARTIAL THROMBOPLASTIN TIME)    MAGNESIUM    PHOSPHORUS    CBC WITH DIFF  OXYGEN - NASAL CANNULA    ECG 12-LEAD    ECG 12-LEAD    OCCULT BLOOD FOR STOOL    POCT FINGERSTICK GLUCOSE    TRANSTHORACIC ECHOCARDIOGRAM - ADULT    CANCELED: INSERT & MAINTAIN PERIPHERAL IV ACCESS    INSERT & MAINTAIN PERIPHERAL IV ACCESS    PERIPHERAL IV DRESSING CHANGE    PATIENT CLASS/LEVEL OF CARE DESIGNATION - [facility]    iopamidol (ISOVUE-370) 76% infusion    heparin 5,000 units/mL initial IV BOLUS    levETIRAcetam (KEPPRA) tablet    amLODIPine (NORVASC) tablet    calcium carbonate-vitamin D3 1250mg  (500mg )-200 units per tablet    dexAMETHasone (DECADRON) tablet    timolol (TIMOPTIC) 0.25% ophthalmic solution    fluticasone (FLONASE) 50 mcg per spray nasal spray    traMADol (ULTRAM) tablet    aspirin (ECOTRIN) enteric coated tablet 81 mg    acetaminophen (TYLENOL) tablet    nitroGLYCERIN (NITROSTAT) sublingual tablet    morphine 4 mg/mL injection    NS flush syringe    NS flush syringe    NS 250 mL flush bag    ondansetron (ZOFRAN) 2 mg/mL injection    LORazepam (ATIVAN) 2 mg/mL injection    heparin 25,000 units in 0.45% NS 250 mL infusion    AND Linked Order Group     SSIP insulin lispro (HUMALOG) 100 units/mL SubQ pen     dextrose 50% (0.5 g/mL) injection - syringe     To board and my question was about whether we should start heparin which could be easily discontinued or Eliquis and he agreed with heparin so we agreed on a 4000 unit bolus with a 1000 per hour to start.  We have ordered a COVID test.  I have advised the son and the patient but she is not lucid enough to  understand.              Pre-Disposition Vitals:  Vitals:    07/29/20 2200 07/29/20 2230 07/29/20 2300 07/29/20 2315   BP: (!) 157/77 126/81 133/73    Pulse:  78 82 78   Resp:  20 (!) 25 20   Temp:       SpO2:  98% 98% 98%   Weight:       Height:       BMI:         Total Critical Care Time for this patient is 35 minutes and it is exclusive of procedural time.      Impression:  1. Multiple pulmonary emboli with consult with emboli in the bilateral upper lobes the right middle lobe and the bilateral lower lobes both segmental and subsegmental with a concern for an infarct and right ventricular strain.  2. Stage IV glioblastoma    Disposition:Admitted       Prescriptions:  New Prescriptions    No medications on file       Follow Up:  No follow-up provider specified.    Carrie Morua Oswaldo Done, MD

## 2020-07-29 NOTE — Case Communication (Signed)
Pt. had several appointments scheduled for this date 07/29/20 so pt. moved to 07/30/20.

## 2020-07-29 NOTE — ED Triage Notes (Addendum)
Pt arrives to room 21 via EMS with c/o chest pain. Left apex; non-radiating. Received ASA 325 PTA and Nitro SL x1. Pt denies any and all pain at this time. Pt has an external implanted device to left upper chest wall with dressing CDI that says "[employer]" and has a black "symptom" button.

## 2020-07-29 NOTE — ED Nurses Note (Signed)
Pt returns to room 21 from CT scan.

## 2020-07-29 NOTE — H&P (Signed)
[facility]  General Medicine  Admission H&P    Date of Service:  07/29/2020  Sarah-Jane, Nazario, 74 y.o. female  Encounter Start Date:  07/29/2020  Inpatient Admission Date: 07/29/2020  Date of Birth:  1946/01/20  PCP: Kayleen Memos, MD    Information Obtained from: patient and history reviewed via medical record  Chief Complaint:  Chest pain    HPI: Taralyn Teela Narducci is a 74 y.o., 14 American female who presents with complaint of severe chest pain was sudden onset earlier this evening.  According to family she had an episode of fecal incontinence and after cleaning herself up developed upper chest pain and mild shortness of breath.  They deny that she has had any nausea, vomiting, diarrhea, fever, chills, cough, focal neurologic symptoms or complaint of headache.  Patient has significant altered mental status since the diagnosis of her multifocal glioblastoma and has great deal of difficulty participating in accurate history or offering any significant complaints.  She is awake, alert and pleasant but is unable to offer much in the way of usable history.  She does admit to having had some chest pain and while being interviewed will occasionally grasper chest and moan.  That she can recall or recorded in the EMR.  She was recently diagnosed with multifocal GBM and underwent brain biopsy which showed necrotic GBM.  She was scheduled to initiate chemotherapy and XRT this week.  There is no history of DVT or previous clotting disorder that she can recall or noted within the EMR.  ED evaluation included a CT pulmonary angiogram which demonstrated multiple segmental and subsegmental PEs was some evidence of possible right heart strain.  Interestingly, patient underwent outpatient echocardiogram earlier today for cardiac evaluation for chemotherapy.  Laboratory evaluation demonstrated leukocytosis and mild hyponatremia.  She will be admitted with a diagnosis of multiple pulmonary emboli,  possible pulmonary infarct and concern for cor pulmonale.        PAST MEDICAL:    Past Medical History:   Diagnosis Date    Acute metabolic encephalopathy 62/13/0865    Brain mass 06/29/2020    MRI: Multiple enhancing masses in the left parietal, temporal, and occipital lobes which are connected by masslike T2 FLAIR signal abnormality.  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  Largest mass extends along the ependymal lining of left lateral ventricle.    Breast mass, right     benign    Cancer (CMS HCC)     Cataract     bilat    Diabetes mellitus, type 2 (CMS [place])     Embolism (CMS HCC)     Family history of colon cancer     Heartburn     HTN (hypertension)     Memory impairment 06/30/2020    Nausea with vomiting     sister with PONV    Obesity     Pulmonary emboli     only once in 1999 after her hysterectomy    Thyroid nodule     left     Wears glasses     reading    Weight loss, unintentional 07/22/2020       Past Surgical History:   Procedure Laterality Date    20550 - INJ SINGLE TENDON SHEATH/LIGAMENT, APONEUROSIS W/ [place] INTERP ONLY (AMB ONLY)  02/10/2016    COLONOSCOPY  2016    HX BREAST BIOPSY Right     BENIGN     HX CATARACT REMOVAL  10/2013    left  HX COLONOSCOPY      HX HYSTERECTOMY  1999    fibroid    HX ROTATOR CUFF REPAIR  11/04/2013    right shoulder       Medications Prior to Admission     Prescriptions    amLODIPine (NORVASC) 10 mg Oral Tablet    TAKE 1 TABLET BY MOUTH EVERY DAY    aspirin (ECOTRIN) 81 mg Oral Tablet, Delayed Release (E.C.)    Take 1 Tablet (81 mg total) by mouth Once a day    calcium citrate-vitamin D3 (CITRACAL) 200 mg-6.25 mcg (250 unit) Oral Tablet    Take 1 Tablet by mouth Once a day    dexAMETHasone (DECADRON) 2 mg Oral Tablet    Take 2 tabs every 8 hours for 5 days; then 1 tab every 8 hours for 5 days; then 1 tab every 12 hours for 5 days; then 1 tab every day for 5 days and stop.    Flaxseed Oil 1,000 mg Oral Capsule    Take by mouth       fluticasone propionate (FLONASE) 50 mcg/actuation Nasal Spray, Suspension    1 Spray by Each Nostril route Twice per day as needed    levETIRAcetam (KEPPRA) 1,000 mg Oral Tablet    Take 1 Tablet (1,000 mg total) by mouth Twice daily for 30 days    metFORMIN (GLUCOPHAGE XR) 500 mg Oral Tablet Sustained Release 24 hr    Take 1 Tablet (500 mg total) by mouth Once a day    multivitamin Oral Tablet    Take 1 Tab by mouth Once a day    ondansetron (ZOFRAN ODT) 8 mg Oral Tablet, Rapid Dissolve    1 Tablet (8 mg total) by Sublingual route Every 8 hours as needed for Nausea/Vomiting for up to 90 days    temozolomide (TEMODAR) 100 mg Oral Capsule    Take 1 Capsule (100 mg total) by mouth Once a day for 42 days with 2 other temozolomide prescriptions for 130 mg total    temozolomide (TEMODAR) 20 mg Oral Capsule    Take 1 Capsule (20 mg total) by mouth Once a day for 42 days with 2 other temozolomide prescriptions for 130 mg total    temozolomide (TEMODAR) 5 mg Oral Capsule    Take 2 Capsules (10 mg total) by mouth Once a day for 42 days with 2 other temozolomide prescriptions for 130 mg total    Patient taking differently:  Take 10 mg by mouth Once a day not to start until radiation treatments begin and to take 1 hour before radiation treatment  Indications: metastatic malignant melanoma     timolol maleate (TIMOPTIC) 0.25 % Ophthalmic Drops    Instill 1 Drop into both eyes Twice daily      traMADoL (ULTRAM) 50 mg Oral Tablet    Take 1 Tablet (50 mg total) by mouth Every 6 hours as needed    Patient taking differently:  Take 1 Tablet by mouth Every 6 hours as needed for Pain. Indications: pain    trimethoprim-sulfamethoxazole (BACTRIM DS) 160-800mg  per tablet    Take 1 Tablet (160 mg total) by mouth Every Monday, Wednesday and Friday for 18 doses    Patient taking differently:  Take 1 Tablet by mouth Every Monday, Wednesday and Friday. not to start until radiation treatments begin and then she takes it1 hour before radiation  treaments on MWF  Indications: urinary tract infection prevention    trimethoprim-sulfamethoxazole (BACTRIM DS) 160-800mg  per tablet  Take 1 Tablet (160 mg total) by mouth Every Monday, Wednesday and Friday for 18 doses        Allergies   Allergen Reactions    Amoxicillin Rash     Vaccinations:        Pneumovax: Received after age 34    Influenza: Will order influenza vaccine.    COVID:  Yes Pfizer 2nd 11/15/19    Family History  Family Medical History:     Problem Relation (Age of Onset)    Breast Cancer Maternal Grandmother    Cancer Mother, Sister    Diabetes Sister    Heart Attack Sister    Hypertension (High Blood Pressure) Mother, Sister    No Known Problems Brother, Maternal Grandfather, Paternal Grandmother, Paternal 5, Daughter, Son, Maternal 44, Maternal Uncle, Paternal 33, Paternal Uncle, Other    Stroke Mother, Father          Social History  Social History     Tobacco Use    Smoking status: Never Smoker    Smokeless tobacco: Never Used   Substance Use Topics    Alcohol use: No     Alcohol/week: 0.0 standard drinks        ROS: Other than ROS in the HPI, all other systems were negative.    Examination:  Temperature: 36.1 C (96.9 F)  Heart Rate: 78  BP (Non-Invasive): 133/73  Respiratory Rate: 20  SpO2: 98 %  General: acutely ill, appears younger than stated age, mild distress and vital signs reviewed and discussed with patient  Eyes: Conjunctiva clear., Pupils equal and round, reactive to light and accomodation. , Sclera non-icteric.   HENT:ENMT without erythema or injection, mucous membranes moist.  Neck: no thyromegaly or lymphadenopathy and supple, symmetrical, trachea midline  Lungs: Clear to auscultation bilaterally.   Cardiovascular: regular rate and rhythm, S1, S2 normal, no murmur, click, rub or gallop  Abdomen: Soft, non-tender, Bowel sounds normal, non-distended, No hepatosplenomegaly, No masses  Genito-urinary: Deferred  Extremities: No cyanosis or edema, No synovitis or  joint effusions  Skin: Skin warm and dry and No rashes  Neurologic: CN II - XII grossly intact , No tremor, Alert, oriented to self and place, not oriented time and demonstrates significant poor recall and tangential thought.  Lymphatics: No lymphadenopathy  Psychiatric: Anxious    Labs:    I have reviewed all lab results.  Lab Results for Last 24 Hours:    Results for orders placed or performed during the hospital encounter of 07/29/20 (from the past 24 hour(s))   COMPREHENSIVE METABOLIC PANEL, NON-FASTING   Result Value Ref Range    SODIUM 133 (L) 136 - 145 mmol/L    POTASSIUM 4.1 3.5 - 5.1 mmol/L    CHLORIDE 96 96 - 111 mmol/L    CO2 TOTAL 24 23 - 31 mmol/L    ANION GAP 13 4 - 13 mmol/L    BUN 12 8 - 25 mg/dL    CREATININE 0.92 0.60 - 1.05 mg/dL    BUN/CREA RATIO 13 6 - 22    ESTIMATED GFR 72 >=60 mL/min/BSA    ALBUMIN 4.1 3.4 - 4.8 g/dL     CALCIUM 10.0 8.8 - 10.2 mg/dL    GLUCOSE 186 (H) 65 - 125 mg/dL    ALKALINE PHOSPHATASE 63 55 - 145 U/L    ALT (SGPT) 38 (H) 8 - 22 U/L    AST (SGOT)  27 8 - 45 U/L    BILIRUBIN TOTAL 0.7 0.3 - 1.3  mg/dL    PROTEIN TOTAL 7.3 6.0 - 8.0 g/dL   TROPONIN-I   Result Value Ref Range    TROPONIN I <7 (L) 7 - 30 ng/L   B-TYPE NATRIURETIC PEPTIDE   Result Value Ref Range    BNP 12 <=99 pg/mL   CBC WITH DIFF   Result Value Ref Range    WBC 17.3 (H) 4.0 - 11.0 x103/uL    RBC 4.91 4.00 - 5.10 x106/uL    HGB 14.1 12.0 - 15.5 g/dL    HCT 42.3 36.0 - 45.0 %    MCV 86.3 82.0 - 97.0 fL    MCH 28.7 27.5 - 33.2 pg    MCHC 33.2 32.0 - 36.0 g/dL    RDW 15.1 11.0 - 16.0 %    PLATELETS 152 150 - 450 x103/uL    MPV 8.7 7.4 - 10.5 fL    NEUTROPHIL % 87 (H) 43 - 76 %    LYMPHOCYTE % 7 (L) 15 - 43 %    MONOCYTE % 6 5 - 12 %    EOSINOPHIL % 0 0 - 5 %    BASOPHIL % 0 0 - 3 %    NEUTROPHIL # 15.00 (H) 1.50 - 6.50 x103/uL    LYMPHOCYTE # 1.10 1.00 - 4.80 x103/uL    MONOCYTE # 1.10 (H) 0.20 - 0.90 x103/uL    EOSINOPHIL # 0.00 0.00 - 0.50 x103/uL    BASOPHIL # 0.00 0.00 - 0.10 x103/uL       Imaging  Studies:    CT PULMONARY ANGIOGRAM:  1. Positive study for pulmonary embolism.  In both upper, right middle and both lower lobes segmental/subsegmental pulmonary artery filling defects are identified  2. There is some consolidation right lower lobe suspicious for infarction  3. Mildly prominent right atrium and ventricle could reflect heart strain    ECHO 07/29/2020:  Report unavailable     ECG:  NSR, LVH    DNR Status:  Full Code    Assessment/Plan:   Active Hospital Problems   (*Primary Problem)    Diagnosis    *Multiple pulmonary emboli (CMS HCC)    Pulmonary infarction (CMS HCC)    GBM (glioblastoma multiforme) (CMS HCC)    DM (diabetes mellitus), type 2 (CMS HCC)    Memory impairment       Multiple pulmonary emboli  Pulmonary infarction  --possible hypercoagulable state related to malignancy  --given the finding of significant clot burden and possibility of right heart strain patient will be admitted for anticoagulation and further observation and evaluation.  --begin IV heparin infusion  --consider transitioning to [place] if deemed appropriate by Hematology  --will consult heme Onc for guidance regarding long-term anticoagulation  --will repeat limited echocardiogram in a.m. 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

## 2020-07-30 ENCOUNTER — Other Ambulatory Visit: Payer: Self-pay

## 2020-07-30 ENCOUNTER — Other Ambulatory Visit: Payer: Medicare PPO

## 2020-07-30 ENCOUNTER — Telehealth (HOSPITAL_COMMUNITY): Payer: Self-pay

## 2020-07-30 DIAGNOSIS — I491 Atrial premature depolarization: Secondary | ICD-10-CM

## 2020-07-30 DIAGNOSIS — C719 Malignant neoplasm of brain, unspecified: Secondary | ICD-10-CM

## 2020-07-30 DIAGNOSIS — I2699 Other pulmonary embolism without acute cor pulmonale: Principal | ICD-10-CM

## 2020-07-30 DIAGNOSIS — R9431 Abnormal electrocardiogram [ECG] [EKG]: Secondary | ICD-10-CM

## 2020-07-30 DIAGNOSIS — Z79899 Other long term (current) drug therapy: Secondary | ICD-10-CM

## 2020-07-30 DIAGNOSIS — Z7901 Long term (current) use of anticoagulants: Secondary | ICD-10-CM

## 2020-07-30 DIAGNOSIS — I517 Cardiomegaly: Secondary | ICD-10-CM

## 2020-07-30 LAB — BASIC METABOLIC PANEL
ANION GAP: 8 mmol/L (ref 4–13)
BUN/CREA RATIO: 13 (ref 6–22)
BUN: 8 mg/dL (ref 8–25)
CALCIUM: 9.2 mg/dL (ref 8.8–10.2)
CHLORIDE: 103 mmol/L (ref 96–111)
CO2 TOTAL: 23 mmol/L (ref 23–31)
CREATININE: 0.63 mg/dL (ref 0.60–1.05)
ESTIMATED GFR: >90 mL/min/BSA (ref >=60)
GLUCOSE: 166 mg/dL — ABNORMAL HIGH (ref 65–125)
POTASSIUM: 4.2 mmol/L (ref 3.5–5.1)
SODIUM: 134 mmol/L — ABNORMAL LOW (ref 136–145)

## 2020-07-30 LAB — CBC WITH DIFF
BASOPHIL #: 0.1 10*3/uL (ref 0.00–0.10)
BASOPHIL %: 0 % (ref 0–3)
EOSINOPHIL #: 0 10*3/uL (ref 0.00–0.50)
EOSINOPHIL %: 0 % (ref 0–5)
HCT: 36.1 % (ref 36.0–45.0)
HGB: 12.3 g/dL (ref 12.0–15.5)
LYMPHOCYTE #: 0.6 10*3/uL — ABNORMAL LOW (ref 1.00–4.80)
LYMPHOCYTE %: 4 % — ABNORMAL LOW (ref 15–43)
MCH: 29.1 pg (ref 27.5–33.2)
MCHC: 34.1 g/dL (ref 32.0–36.0)
MCV: 85.3 fL (ref 82.0–97.0)
MONOCYTE #: 1 10*3/uL — ABNORMAL HIGH (ref 0.20–0.90)
MONOCYTE %: 6 % (ref 5–12)
MPV: 8.4 fL (ref 7.4–10.5)
NEUTROPHIL #: 15.3 10*3/uL — ABNORMAL HIGH (ref 1.50–6.50)
NEUTROPHIL %: 90 % — ABNORMAL HIGH (ref 43–76)
PLATELETS: 131 10*3/uL — ABNORMAL LOW (ref 150–450)
RBC: 4.24 10*6/uL (ref 4.00–5.10)
RDW: 15.1 % (ref 11.0–16.0)
WBC: 17 10*3/uL — ABNORMAL HIGH (ref 4.0–11.0)

## 2020-07-30 LAB — ECG 12-LEAD
Atrial Rate: 81 {beats}/min
Calculated P Axis: 47 degrees
Calculated R Axis: -10 degrees
Calculated T Axis: 37 degrees
PR Interval: 168 ms
QRS Duration: 70 ms
QT Interval: 334 ms
QTC Calculation: 387 ms
Ventricular rate: 81 {beats}/min

## 2020-07-30 LAB — POC FINGERSTICK GLUCOSE - BMC/JMC (RESULTS)
GLUCOSE, POC: 168 mg/dL — ABNORMAL HIGH (ref 60–100)
GLUCOSE, POC: 184 mg/dL — ABNORMAL HIGH (ref 60–100)
GLUCOSE, POC: 183 mg/dL — ABNORMAL HIGH (ref 60–100)

## 2020-07-30 LAB — PT/INR
INR: 1.24
INR: 1.39
PROTHROMBIN TIME: 14.4 s — ABNORMAL HIGH (ref 9.4–12.5)
PROTHROMBIN TIME: 16.2 s — ABNORMAL HIGH (ref 9.4–12.5)

## 2020-07-30 LAB — PHOSPHORUS: PHOSPHORUS: 3.4 mg/dL (ref 2.3–4.0)

## 2020-07-30 LAB — MAGNESIUM: MAGNESIUM: 2 mg/dL (ref 1.8–2.6)

## 2020-07-30 LAB — PTT (PARTIAL THROMBOPLASTIN TIME)
APTT: 150.4 s (ref 24.0–36.5)
APTT: 23.7 s — ABNORMAL LOW (ref 25.1–36.5)
APTT: 41.5 s — ABNORMAL HIGH (ref 25.1–36.5)
APTT: 93.6 s — ABNORMAL HIGH (ref 25.1–36.5)

## 2020-07-30 LAB — COVID-19 ~~LOC~~ MOLECULAR LAB TESTING: SARS-CoV-2: NOT DETECTED

## 2020-07-30 LAB — THYROID STIMULATING HORMONE WITH FREE T4 REFLEX: TSH: 1.059 u[IU]/mL (ref 0.430–3.550)

## 2020-07-30 MED ORDER — HEPARIN (PORCINE) 5,000 UNITS/ML BOLUS FOR DOSE ADJUSTMENT
25.0000 [IU]/kg | Freq: Once | INTRAMUSCULAR | Status: DC
Start: 2020-07-30 — End: 2020-08-01
  Administered 2020-07-30: 0 [IU] via INTRAVENOUS

## 2020-07-30 NOTE — Progress Notes (Signed)
IP PROGRESS NOTE      Carrie Escobar  Date of Admission:  07/29/2020  Date of Birth:  03/01/1946  Date of Service:  07/30/2020    Chief Complaint:  Follow-up for multiple pulmonary emboli  Subjective:  Altered mental status/chest pain    Patient with history of recent diagnosis of brain cancer (glioblastoma ) comes in with chest pain and found out to have multiple pulmonary embolism/ infarction currently on heparin drip.  Seen and examined in the ED.  echocardiogram reading pending.  Hemodynamically stable.    Problem List:  Active Hospital Problems   (*Primary Problem)    Diagnosis    *Multiple pulmonary emboli (CMS HCC)    GBM (glioblastoma multiforme) (CMS HCC)    Pulmonary infarction (CMS HCC)    DM (diabetes mellitus), type 2 (CMS HCC)    Memory impairment     Assessment/ Plan:       1-Multiple pulmonary emboli/ Pulmonary infarction  Provoked by malignancy  given the finding of significant clot burden and possibility of right heart strain patient will be continued with heparin drip, echocardiogram is pending  Hematology-Oncology evaluation for long-term anticoagulation at discharge  hemodynamically stable, no indication for thrombolysis  XRT and chemotherapy will likely be delayed due to this new diagnosis    2-GBM  multifocal, high-grade  planned whole-brain radiation and systemic chemotherapy  consult heme Onc for direction on rescheduling therapy    3-Memory impairment/altered mental status  patient appears to have some significant cognitive and memory impairment, reportedly related to recent diagnosis of brain tumors  Does not have decisional capacity  may consider further evaluation by Neurology    4-Diabetes mellitus type 2  SSI protocol  goal glucose 120-180 while hospitalized  monitor closely with steroid therapy    DVT/PE Prophylaxis: Heparin  Code status:  Full code  Brother is Media planner.    Vital Signs:  Temp (24hrs) Max:36.9 C (98.5 F)      Temperature: 36.9 C (98.5  F)  BP (Non-Invasive): 108/68  MAP (Non-Invasive): 80 mmHG  Heart Rate: 68  Respiratory Rate: 15  SpO2: 100 %      Objective     Current Medications:  acetaminophen (TYLENOL) tablet, 650 mg, Oral, Q6H PRN  amLODIPine (NORVASC) tablet, 10 mg, Oral, Daily  aspirin (ECOTRIN) enteric coated tablet 81 mg, 81 mg, Oral, Daily  calcium carbonate-vitamin D3 1250mg  (500mg )-200 units per tablet, 1 Tablet, Oral, Daily  dexAMETHasone (DECADRON) tablet, 4 mg, Oral, Q12H  SSIP insulin lispro (HUMALOG) 100 units/mL SubQ pen, 1-12 Units, Subcutaneous, 4x/day AC   And  dextrose 50% (0.5 g/mL) injection - syringe, 12.5 g, Intravenous, Q15 Min PRN  fluticasone (FLONASE) 50 mcg per spray nasal spray, 1 Spray, Each Nostril, Daily  heparin 25,000 units in 0.45% NS 250 mL infusion, 17.24 Units/kg/hr (Adjusted), Intravenous, Continuous  levETIRAcetam (KEPPRA) tablet, 1,000 mg, Oral, 2x/day  LORazepam (ATIVAN) 2 mg/mL injection, 0.5 mg, Intravenous, Q4H PRN  morphine 4 mg/mL injection, 4 mg, Intravenous, Q5 Min PRN  nitroGLYCERIN (NITROSTAT) sublingual tablet, 0.4 mg, Sublingual, Q5 Min PRN  NS 250 mL flush bag, , Intravenous, Q1H PRN  NS flush syringe, 10 mL, Intravenous, Q8HRS  NS flush syringe, 10 mL, Intravenous, Q1H PRN  ondansetron (ZOFRAN) 2 mg/mL injection, 4 mg, Intravenous, Q6H PRN  timolol (TIMOPTIC) 0.25% ophthalmic solution, 1 Drop, Both Eyes, 2x/day  traMADol (ULTRAM) tablet, 50 mg, Oral, Q6H PRN        Today's Physical Exam:  General: appears  pleasantly confused ,chronically sick looking . No distress.   Eyes: Pupils equal and round, reactive to light and accomodation.   HENT:Head atraumatic and normocephalic   Neck: No JVD or thyromegaly or lymphadenopathy   Lungs: CTAB, non labored breathing, no rales or wheezing.    Cardiovascular: regular rate and rhythm, S1, S2 normal, no murmur,   Abdomen: Soft, non-tender, Bowel sounds normal, No hepatosplenomegaly   Extremities: extremities normal, atraumatic, no cyanosis or edema    Skin: Skin warm and dry   Neurologic:  Pleasantly confused  Psychiatric:  Apprehensive    I/O:  I/O last 24 hours:  No intake or output data in the 24 hours ending 07/30/20 1141  I/O current shift:  No intake/output data recorded.      Labs  Please indicate ordered or reviewed)  Reviewed: I have reviewed all lab results.    Radiology Tests (Please indicate ordered or reviewed)  Reviewed: personally reviewed all available images    Medical decision making:  All pertinent labs were personally reviewed with additional follow-up lab work ordered as deemed clinically appropriate.  All available imaging results and EKGs were reviewed and independently interpreted.  Pertinent previous medical record results were reviewed.  Further workup and treatment depending on clinical cours      Portions of this note may be dictated using voice recognition software or a dictation service. Variances in spelling and vocabulary are possible and unintentional. Not all errors are caught/corrected. Please notify the [facility] if any discrepancies are noted or if the meaning of any statement is not clear.

## 2020-07-30 NOTE — Case Communication (Signed)
Pt. admitted to [facility] last night with multiple P.E. SN missed visit this date due to being in hospital.

## 2020-07-30 NOTE — Care Management Notes (Signed)
I received a phone call from Probation officer for [place] home health agency.  She states that Carrie Escobar is open to their agency for nursing/PT/a home health aid as well as a Education officer, museum.

## 2020-07-30 NOTE — Telephone Encounter (Signed)
Brother called to report pt was taken to ED last night and admitted with multiple PEs.    Servando Snare  Triage Nurse

## 2020-07-30 NOTE — Care Management Notes (Addendum)
07/30/20 1210   Assessment Detail   Assessment Type Admission   Readmission   Is this a readmission? Yes   Number of days between last admission and this admission? 22   Medicare Intent to Discharge Documentation   Admit IMM given to: Patient Representative   Admit IMM letter given date 07/30/20   Patient Representative   (Brother/HCS-William Wynetta Emery)   Social Work Plan   Discharge Planning Status initial meeting   Projected Discharge Date   (unknown)   Anticipated Discharge Disposition   (home with [facility])   [place] Decision Maker Type: HCS   Name Lokelani Lutes   Phone Number [phone]   Employment/Military        Pharmacy Name [facility] [address]        Pharmacy Location [place]     Notes on 9.24.21 from [place]ward Fortes, [place] for this patient indicate that the POA document in chart is not complete because it is not notarized, but that the patient's brother, Ellary Casamento, would see if he could find a copy that is notarized. Reviewed documents that have been scanned into patient's chart and none of them are fully completed POA documents, so patient will need a new HCS document this admission.     Sent secure chat to attending physician inquiring about patient's capacity. Received signed HCS form deeming patient WITHOUT capacity due to a brain tumor for a long-term duration. Contacted brother, Ovella Manygoats, to see if he would act as HCS for patient again and he was agreeable to this role. We discussed the POA document again and he stated that he spoke to the law office and he found out that the law office just forgot to notarize the POA document. He understands it is not valid in state of [place] without being notarized, and they cannot complete a new one if the patient does not have capacity. He discussed that he may work on getting guardianship/conservatorship for his sister, due to needing access to her finances.    Scanned copy of new HCS into chart and updated advance care planning  tab. [name] requested copy of HCS document, will provide copy to him today when he comes to hospital to visit patient.    Patient still lives with her brother in a single family home with no STE; he is staying with her to take care of her but he has his own home in [place]. Brother provides transportation to the patient and takes her where she needs to go. Patient can afford her medications, but her brother makes her medical appointments. [facility] [place]. He helps her with all of her ADLs. Patient had services with [facility] before this admission, and her brother states he would like her to continue with [facility] upon discharge.     Anticipated discharge is back home with [facility] and assistance from family (brother, [name], and cousin, Corie Chiquito).

## 2020-07-30 NOTE — Cancer Center Note (Signed)
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 female , who was found to have multiple enhancing masses in the left parietal, temporal and occipital lobes that extend into the splenium of the corpus callosum and underwent left temporal stealth guided burr hole and tumor biopsy on 09/23 that is consistent with WHO grade 4 GBM. Patient's husband was the primary source of history.      Patient Active Problem List    Diagnosis    Multiple pulmonary emboli (CMS HCC)    GBM (glioblastoma multiforme) (CMS HCC)    Pulmonary infarction (CMS HCC)    DM (diabetes mellitus), type 2 (CMS HCC)    Brain mass    Memory impairment    Acute metabolic encephalopathy    Trigger finger of right hand    Atypical chest pain    Dependent edema    Routine general medical examination at a health care facility    Senile nuclear sclerosis    Rotator cuff syndrome of right shoulder    Shoulder pain, right    Lipoma of shoulder     Past Medical History:   Diagnosis Date    Acute metabolic encephalopathy 46/56/8127    Brain mass 06/29/2020    MRI: Multiple enhancing masses in the left parietal, temporal, and occipital lobes which are connected by masslike T2 FLAIR signal abnormality.  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  Largest mass extends along the ependymal lining of left lateral ventricle.    Breast mass, right     benign    Cancer (CMS HCC)     Cataract     bilat    Diabetes mellitus, type 2 (CMS HCC)     Embolism (CMS HCC)     Family history of colon cancer     Heartburn     HTN (hypertension)     Memory  impairment 06/30/2020    Nausea with vomiting     sister with PONV    Obesity     Pulmonary emboli     only once in 1999 after her hysterectomy    Thyroid nodule     left     Wears glasses     reading    Weight loss, unintentional 07/22/2020         Past Surgical History:   Procedure Laterality Date    20550 - INJ SINGLE TENDON SHEATH/LIGAMENT, APONEUROSIS W/ [place] INTERP ONLY (AMB ONLY)  02/10/2016    COLONOSCOPY  2016    HX BREAST BIOPSY Right     BENIGN     HX CATARACT REMOVAL  10/2013    left    HX COLONOSCOPY      HX HYSTERECTOMY  1999    fibroid    HX ROTATOR CUFF REPAIR  11/04/2013    right shoulder         Current Outpatient Medications   Medication Sig    amLODIPine (NORVASC) 10 mg Oral Tablet TAKE 1 TABLET BY MOUTH EVERY DAY    aspirin (ECOTRIN) 81 mg Oral  Tablet, Delayed Release (E.C.) Take 1 Tablet (81 mg total) by mouth Once a day    calcium citrate-vitamin D3 (CITRACAL) 200 mg-6.25 mcg (250 unit) Oral Tablet Take 1 Tablet by mouth Once a day    dexAMETHasone (DECADRON) 2 mg Oral Tablet Take 2 tabs every 8 hours for 5 days; then 1 tab every 8 hours for 5 days; then 1 tab every 12 hours for 5 days; then 1 tab every day for 5 days and stop.    Flaxseed Oil 1,000 mg Oral Capsule Take by mouth      fluticasone propionate (FLONASE) 50 mcg/actuation Nasal Spray, Suspension 1 Spray by Each Nostril route Twice per day as needed    levETIRAcetam (KEPPRA) 1,000 mg Oral Tablet Take 1 Tablet (1,000 mg total) by mouth Twice daily for 30 days    metFORMIN (GLUCOPHAGE XR) 500 mg Oral Tablet Sustained Release 24 hr Take 1 Tablet (500 mg total) by mouth Once a day    multivitamin Oral Tablet Take 1 Tab by mouth Once a day    ondansetron (ZOFRAN ODT) 8 mg Oral Tablet, Rapid Dissolve 1 Tablet (8 mg total) by Sublingual route Every 8 hours as needed for Nausea/Vomiting for up to 90 days    temozolomide (TEMODAR) 100 mg Oral Capsule Take 1 Capsule (100 mg total) by mouth Once a day for 42 days with 2  other temozolomide prescriptions for 130 mg total    temozolomide (TEMODAR) 20 mg Oral Capsule Take 1 Capsule (20 mg total) by mouth Once a day for 42 days with 2 other temozolomide prescriptions for 130 mg total    temozolomide (TEMODAR) 5 mg Oral Capsule Take 2 Capsules (10 mg total) by mouth Once a day for 42 days with 2 other temozolomide prescriptions for 130 mg total (Patient taking differently: Take 10 mg by mouth Once a day not to start until radiation treatments begin and to take 1 hour before radiation treatment  Indications: metastatic malignant melanoma )    timolol maleate (TIMOPTIC) 0.25 % Ophthalmic Drops Instill 1 Drop into both eyes Twice daily      traMADoL (ULTRAM) 50 mg Oral Tablet Take 1 Tablet (50 mg total) by mouth Every 6 hours as needed (Patient taking differently: Take 1 Tablet by mouth Every 6 hours as needed for Pain. Indications: pain)    trimethoprim-sulfamethoxazole (BACTRIM DS) 160-820m per tablet Take 1 Tablet (160 mg total) by mouth Every Monday, Wednesday and Friday for 18 doses (Patient taking differently: Take 1 Tablet by mouth Every Monday, Wednesday and Friday. not to start until radiation treatments begin and then she takes it1 hour before radiation treaments on MWF  Indications: urinary tract infection prevention)    trimethoprim-sulfamethoxazole (BACTRIM DS) 160-8021mper tablet Take 1 Tablet (160 mg total) by mouth Every Monday, Wednesday and Friday for 18 doses     Allergies   Allergen Reactions    Amoxicillin Rash     Family Medical History:     Problem Relation (Age of Onset)    Breast Cancer Maternal Grandmother    Cancer Mother, Sister    Diabetes Sister    Heart Attack Sister    Hypertension (High Blood Pressure) Mother, Sister    No Known Problems Brother, Maternal Grandfather, Paternal Grandmother, Paternal Gr28Daughter, Son, Maternal Aunt, Maternal Uncle, Paternal Au73Paternal Uncle, Other    Stroke Mother, Father            Social History      Tobacco Use  Smoking status: Never Smoker    Smokeless tobacco: Never Used   Substance Use Topics    Alcohol use: No     Alcohol/week: 0.0 standard drinks         Review of Systems:  Memory issue  Headaches  Confusion    Data Reviewed: labs and imaging    Assessment/Plan:    ICD-10-CM    1. 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 Temporal stealth guided burr hole and tumor biopsy (07/02/2020)   # Memory, confusion and headaches    - I have reviewed all of the available medical records.  - I have reviewed serial imaging studies.  - post-biopsy course remains uncomplicated.  - Continue PT&OT  - recommend concurrent RT-Temozolomide X 6 weeks   - discussed the risks and benefits of TMZ 75 mg/m2 PO daily X 6 weeks  - script for TMZ, Bactrim MWF and anti-nausea meds by Dr. Oletta Darter  - Consented for tumor tissue genetics  - patient's husband verbalized his understanding. Aware to call us should any questions or concerns occur.  - phone RTNV w/ me in 4 weeks  - labs qweekly  Total time: 60 min over phone discussing the diagnosis, biology, prognosis and management.  Richrd Sox, MD

## 2020-07-30 NOTE — ED Nurses Note (Signed)
Pt moaning and groaning loudly. Stated "yes," tearfully, when asked if she was in pain. Said, "my chest" when asked where her pain was. Turned on light to assess pt more closely and saw she had d/ced the left [facility] IV catheter where the heparin drip was infusing. Pressure bandage applied to still bleeding IV site. Heparin gtt paused. Large blood spill cleaned up. New IV on right side inserted. Pt medicated with Morphine twice; moaning and groaning has now subsided. Heparin gtt restarted to new IV site. O2 @ 2L/NC applied. Pt given pillow and warm blankets. Pt now resting comfortably and quietly on all monitors. See vitals.

## 2020-07-30 NOTE — Consults (Signed)
[facility]  Initial Note      Dimitri, Dsouza  Encounter Start Date:  07/29/2020  Inpatient Admission Date: 07/29/2020  Date of Service: 07/30/2020  Date of Birth:  14-Sep-1946    Requesting Provider: Dr. Bradly Bienenstock  Information Obtained from: patient and history reviewed via medical record  Reason for Consultation:  "GBM, Multiple PEs, recomend chronic anticoag"    Impression:  Carrie Escobar is a 74 y.o. female with MGMT unmethylated, 1p loss, 19q intact unresectable GBM planned for chemoRT with temozolomide, now admitted with pulmonary embolism.  She is confused at baseline after initial presentation.  Agree with anticoagulation, logistics of her treatment will need to be coordinated after this acute presentation.    Heme/Onc Diagnosis:   provoked PE  GBM    Recommendations:   -currently on heparin gtt, LMWH should be considered when converting to a home regimen, however DOACs are reasonable as an alternative in patients who require anticoagulation and desire oral therapy.  -will coordinate chemoRT in the outpatient setting  -no current absolute contraindications to anticoagulation, BP stable, no acute bleed, no surgical interventions in the last 14d  -would monitor on [facility] for 48h at minimum, with conversion to oral therapy and follow up with her primary oncologist, Dr. Oletta Darter, in the outpatient setting.    Will follow in the periphery, please call with any questions  Carlena Hurl, MD     ________________________________________________________________________________________________________________________________________________     HPI:  Carrie Escobar is a 74 y.o. female known to the oncology team due to newly diagnosed GBM, planned for chemoRT with temozolomide, now admitted with PE.  She is confused at baseline and unable to give a good history.  She currently denies chest pain or shortness of breath.  On chart review, she presented with complaints of  severe chest pain sudden in onset associated with shortness of breath.  There is no family at baseline. She has had confusion since her diagnosis of GBM, has insight to her confusion and is apologetic.  She does recall her cancer diagnosis on direct questioning.    ________________________________________________________________________________________________________________________________________________     Diagnosis-GBM, diagnosed September 2021  Stage-Multifocal GBM-Unresectable.  Molecular/special studies: MGMT-un-methylated  1p-allelic loss+, 88C: Intact  CARIS/NGS will be sent.     ___________________________________________________________  Ms Dodds was admitted to [facility] between 9/18 and 09/29 with worsening headaches, memory loss and confusion.    MRI BRAIN :Multiple enhancing masses in the left parietal, temporal and occipital lobes which are connected by masslike T2/FLAIR signal abnormality which also extends into the splenium of the corpus callosum. 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 The largest mass extends along the  ependymal lining of the left lateral ventricle. 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    Neurosurgery, Neurology and Medical Oncology were involved in her care.    07/01/2020-Left temporal Stealth guided burr hole and tumor biopsy (Left) done by Dr Ebony Hail. The left temporal mass showed evidence of gliosis and H of lesional tissue with doses. The left temporal mass showed extensively necrotic glioma-favoring GBM grade 4.    MGMT methylation-Not detected  1p loss-detected  19q loss-Not detected      ________________________________________________________________________________________________________________________________________________     Objective   Past Medical History:   Diagnosis Date    Acute metabolic encephalopathy 16/60/6301    Brain mass 06/29/2020    MRI: Multiple enhancing masses in the left parietal, temporal, and  occipital lobes which are connected by masslike  T2 FLAIR signal abnormality.  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  Largest mass extends along the ependymal lining of left lateral ventricle.    Breast mass, right     benign    Cancer (CMS HCC)     Cataract     bilat    Diabetes mellitus, type 2 (CMS [place])     Embolism (CMS HCC)     Family history of colon cancer     Heartburn     HTN (hypertension)     Memory impairment 06/30/2020    Nausea with vomiting     sister with PONV    Obesity     Pulmonary emboli     only once in 1999 after her hysterectomy    Thyroid nodule     left     Wears glasses     reading    Weight loss, unintentional 07/22/2020         Past Surgical History:   Procedure Laterality Date    20550 - INJ SINGLE TENDON SHEATH/LIGAMENT, APONEUROSIS W/ [place] INTERP ONLY (AMB ONLY)  02/10/2016    COLONOSCOPY  2016    HX BREAST BIOPSY Right     BENIGN     HX CATARACT REMOVAL  10/2013    left    HX COLONOSCOPY      HX HYSTERECTOMY  1999    fibroid    HX ROTATOR CUFF REPAIR  11/04/2013    right shoulder         Medications Prior to Admission     Prescriptions    amLODIPine (NORVASC) 10 mg Oral Tablet    TAKE 1 TABLET BY MOUTH EVERY DAY    aspirin (ECOTRIN) 81 mg Oral Tablet, Delayed Release (E.C.)    Take 1 Tablet (81 mg total) by mouth Once a day    calcium citrate-vitamin D3 (CITRACAL) 200 mg-6.25 mcg (250 unit) Oral Tablet    Take 1 Tablet by mouth Once a day    dexAMETHasone (DECADRON) 2 mg Oral Tablet    Take 2 tabs every 8 hours for 5 days; then 1 tab every 8 hours for 5 days; then 1 tab every 12 hours for 5 days; then 1 tab every day for 5 days and stop.    Flaxseed Oil 1,000 mg Oral Capsule    Take by mouth      fluticasone propionate (FLONASE) 50 mcg/actuation Nasal Spray, Suspension    1 Spray by Each Nostril route Twice per day as needed    levETIRAcetam (KEPPRA) 1,000 mg Oral Tablet    Take 1 Tablet (1,000 mg total) by mouth Twice daily for 30 days    metFORMIN  (GLUCOPHAGE XR) 500 mg Oral Tablet Sustained Release 24 hr    Take 1 Tablet (500 mg total) by mouth Once a day    multivitamin Oral Tablet    Take 1 Tab by mouth Once a day    ondansetron (ZOFRAN ODT) 8 mg Oral Tablet, Rapid Dissolve    1 Tablet (8 mg total) by Sublingual route Every 8 hours as needed for Nausea/Vomiting for up to 90 days    temozolomide (TEMODAR) 100 mg Oral Capsule    Take 1 Capsule (100 mg total) by mouth Once a day for 42 days with 2 other temozolomide prescriptions for 130 mg total    temozolomide (TEMODAR) 20 mg Oral Capsule    Take 1 Capsule (20 mg total) by mouth Once a day for 42 days with 2 other  temozolomide prescriptions for 130 mg total    temozolomide (TEMODAR) 5 mg Oral Capsule    Take 2 Capsules (10 mg total) by mouth Once a day for 42 days with 2 other temozolomide prescriptions for 130 mg total    Patient taking differently:  Take 10 mg by mouth Once a day not to start until radiation treatments begin and to take 1 hour before radiation treatment  Indications: metastatic malignant melanoma     timolol maleate (TIMOPTIC) 0.25 % Ophthalmic Drops    Instill 1 Drop into both eyes Twice daily      traMADoL (ULTRAM) 50 mg Oral Tablet    Take 1 Tablet (50 mg total) by mouth Every 6 hours as needed    Patient taking differently:  Take 1 Tablet by mouth Every 6 hours as needed for Pain. Indications: pain    trimethoprim-sulfamethoxazole (BACTRIM DS) 160-800mg  per tablet    Take 1 Tablet (160 mg total) by mouth Every Monday, Wednesday and Friday for 18 doses    Patient taking differently:  Take 1 Tablet by mouth Every Monday, Wednesday and Friday. not to start until radiation treatments begin and then she takes it1 hour before radiation treaments on MWF  Indications: urinary tract infection prevention    trimethoprim-sulfamethoxazole (BACTRIM DS) 160-800mg  per tablet    Take 1 Tablet (160 mg total) by mouth Every Monday, Wednesday and Friday for 18 doses        acetaminophen (TYLENOL)  tablet, 650 mg, Oral, Q6H PRN  amLODIPine (NORVASC) tablet, 10 mg, Oral, Daily  aspirin (ECOTRIN) enteric coated tablet 81 mg, 81 mg, Oral, Daily  calcium carbonate-vitamin D3 1250mg  (500mg )-200 units per tablet, 1 Tablet, Oral, Daily  dexAMETHasone (DECADRON) tablet, 4 mg, Oral, Q12H  SSIP insulin lispro (HUMALOG) 100 units/mL SubQ pen, 1-12 Units, Subcutaneous, 4x/day AC   And  dextrose 50% (0.5 g/mL) injection - syringe, 12.5 g, Intravenous, Q15 Min PRN  fluticasone (FLONASE) 50 mcg per spray nasal spray, 1 Spray, Each Nostril, Daily  heparin 25,000 units in 0.45% NS 250 mL infusion, 17.24 Units/kg/hr (Adjusted), Intravenous, Continuous  heparin 5,000 units/mL injection IV bolus, 25 Units/kg (Adjusted), Intravenous, Once  levETIRAcetam (KEPPRA) tablet, 1,000 mg, Oral, 2x/day  LORazepam (ATIVAN) 2 mg/mL injection, 0.5 mg, Intravenous, Q4H PRN  morphine 4 mg/mL injection, 4 mg, Intravenous, Q5 Min PRN  nitroGLYCERIN (NITROSTAT) sublingual tablet, 0.4 mg, Sublingual, Q5 Min PRN  NS 250 mL flush bag, , Intravenous, Q1H PRN  NS flush syringe, 10 mL, Intravenous, Q8HRS  NS flush syringe, 10 mL, Intravenous, Q1H PRN  ondansetron (ZOFRAN) 2 mg/mL injection, 4 mg, Intravenous, Q6H PRN  timolol (TIMOPTIC) 0.25% ophthalmic solution, 1 Drop, Both Eyes, 2x/day  traMADol (ULTRAM) tablet, 50 mg, Oral, Q6H PRN      Allergies   Allergen Reactions    Amoxicillin Rash       Family History: Reviewed, discussed  Family Medical History:     Problem Relation (Age of Onset)    Breast Cancer Maternal Grandmother    Cancer Mother, Sister    Diabetes Sister    Heart Attack Sister    Hypertension (High Blood Pressure) Mother, Sister    No Known Problems Brother, Maternal Grandfather, Paternal Grandmother, Paternal Grandfather, Daughter, Son, Maternal Aunt, Maternal Uncle, Paternal 46, Paternal Uncle, Other    Stroke Mother, Father          Social History  Social History     Socioeconomic History    Marital status: Divorced  Spouse  name: Not on file    Number of children: 0    Years of education: Not on file    Highest education level: Not on file   Occupational History     Employer: [employer]   Tobacco Use    Smoking status: Never Smoker    Smokeless tobacco: Never Used   Vaping Use    Vaping Use: Never used   Substance and Sexual Activity    Alcohol use: No     Alcohol/week: 0.0 standard drinks    Drug use: No    Sexual activity: Not Currently   Other Topics Concern    Abuse/Domestic Violence No    Breast Self Exam Not Asked    Caffeine Concern Not Asked    Calcium intake adequate Not Asked    Computer Use Not Asked    Drives Yes    Exercise Concern Not Asked    Helmet Use Not Asked    Seat Belt Not Asked    Special Diet No    Sunscreen used Not Asked    Uses Cane No    Uses walker No    Uses wheelchair No    Right hand dominant Yes    Left hand dominant No    Ambidextrous Not Asked    Shift Work Not Asked    Unusual Sleep-Wake Schedule Not Asked    Ability to Walk 1 Flight of Steps without SOB/CP Yes    Routine Exercise Yes     Comment: Zumba class    Ability to Walk 2 Flight of Steps without SOB/CP Yes    Unable to Ambulate Not Asked    Total Care Not Asked    Ability To Do Own ADL's Yes    Uses Walker Not Asked    Other Activity Level Not Asked    Uses Cane Not Asked   Social History Narrative    Not on file     Social Determinants of Health     Financial Resource Strain:     Difficulty of Paying Living Expenses:    Food Insecurity:     Worried About Charity fundraiser in the Last Year:     Arboriculturist in the Last Year:    Transportation Needs:     Film/video editor (Medical):     Lack of Transportation (Non-Medical):    Physical Activity:     Days of Exercise per Week:     Minutes of Exercise per Session:    Stress:     Feeling of Stress :    Intimate Partner Violence:     Fear of Current or Ex-Partner:     Emotionally Abused:     Physically Abused:     Sexually Abused:         REVIEW OF SYSTEMS  Review of Systems   Constitutional: Positive for fatigue and unexpected weight change.   Respiratory: Positive for chest tightness and shortness of breath.    Cardiovascular: Negative for chest pain and leg swelling.   Gastrointestinal: Negative for abdominal pain.   Musculoskeletal: Negative for arthralgias.   Skin: Negative for rash and wound.   Neurological: Positive for headaches.   Psychiatric/Behavioral: Positive for confusion.   All other systems reviewed and are negative.       EXAM  VITALS:    Temperature: 36.9 C (98.5 F)  Heart Rate: 63  BP (Non-Invasive): 126/72  Respiratory Rate: 17  SpO2: 100 %  Physical  Exam  Vitals reviewed.   Constitutional:       Appearance: Normal appearance.   Cardiovascular:      Rate and Rhythm: Normal rate and regular rhythm.   Pulmonary:      Effort: Pulmonary effort is normal.      Breath sounds: Normal breath sounds. No wheezing or rhonchi.   Chest:      Chest wall: No tenderness.   Abdominal:      Palpations: Abdomen is soft.      Tenderness: There is no abdominal tenderness.   Musculoskeletal:         General: No swelling.   Skin:     General: Skin is warm and dry.   Neurological:      Mental Status: She is alert. Mental status is at baseline.   Psychiatric:      Comments: (+) confusion          IMAGES:  reviewed by radiology and independently by me, agree with radiologic description    LABS:  I have reviewed all lab results.          Carlena Hurl, MD

## 2020-07-30 NOTE — Case Communication (Signed)
Pt. admitted to [facility] visit due to this on 07/30/20.

## 2020-07-30 NOTE — Care Management Notes (Signed)
Quick Medical Necessity review.

## 2020-07-30 NOTE — ED Nurses Note (Signed)
Report called to Kathlee Nations RN on the 6th floor and patient will be transported shortly.

## 2020-07-30 NOTE — Nursing Note (Signed)
Services currently provided to patient:  Skilled Nursing  Physical Therapy  Aide    Case Conference Notes:  Patient has Stage IV Glioblastoma.   Currently hospitalized at [facility] with multiple bilateral PEs.     Summary of Care:       Priority Issues:       Discipline Notes:  07/30/20-Pt. is 74 yo admitted to [facility] on 07/09/20 after being in [facility] and Brain mass discovered and now is known to be Brain Tumor.Pt. is oriented to person, but not place.time or situation,she is pleasantly confused and brother [name] is staying with , pt. in her home and is primary caregiver and MPOA.Pt. starting on Temodar and radiation treaments for tumor.She also has HHA,and PT.Pt. ambulates with walker.  PT Note Author: Helmut Muster, PTA . Date: 07/29/20.  Note: Patient has been progressing well , but has good and bad days due to headaches and nausea.  RA next visit to determine if we will continue with PT, patient starts Chemo tomorrow 07/30/20, .  No Value exists for the SDE: [id number]  No Value exists for the SDE: [facility]  MSW completed initial visit with patient brother/caregiver today. Patient was in living room visiting with a friend. Patient is easily confused due a brain tumor. PCG states she can't remember names and often is confused about ADLs. She will look at , her clothes and not understand what or how to put on her shirt. PCG lives in [place] but is retired and is going to be staying with her most of October and November except for couple of days when he needs to go home for appointments. Patient's brother and,  sister can help when he isn't here but they are looking for other resources to help alleviate some of the caretaking. PCG states patient has too much money in savings to qualify for anything and he is looking for night time only at this point. MSW e, ducated patient caregiver on Private Duty caregivers and provided him with a list of agencies as well as brochures on [employer], [employer], and [employer]  Staff. PCG denied wanting MSW to call to set up a consultation at this time but wil, l do it soon. PCG also inquired about getting incapacity letter from PCP so his Durable and Medical POA becomes effective. MSW explained he would have to get doctor to do that. MSW found in the chart where a doctor in [facility] did sign that patient lacks , capacity. PCG will call PCP today regarding this as he wants to get added to Patient accounts. PCG denied any further needs stating he cooks and cleans and transports patient where she needs to go. PCG will call MSW if new needs arise.

## 2020-07-30 NOTE — ED Nurses Note (Signed)
Report to Mackenzie RN.

## 2020-07-30 NOTE — ED Nurses Note (Signed)
Pt noted to have IV self removed. Pt is unsure how IV came out. Bleeding stopped and site covered with pressure dressing. Pt cleaned up, linens change and pt assisted with meal tray. No other needs at this time.

## 2020-07-31 ENCOUNTER — Ambulatory Visit: Payer: Self-pay

## 2020-07-31 ENCOUNTER — Inpatient Hospital Stay (HOSPITAL_COMMUNITY): Payer: Medicare PPO

## 2020-07-31 ENCOUNTER — Encounter (HOSPITAL_COMMUNITY): Payer: Self-pay | Admitting: Internal Medicine

## 2020-07-31 ENCOUNTER — Encounter (HOSPITAL_COMMUNITY): Payer: Medicare PPO | Admitting: Internal Medicine

## 2020-07-31 ENCOUNTER — Other Ambulatory Visit: Payer: Self-pay

## 2020-07-31 DIAGNOSIS — I361 Nonrheumatic tricuspid (valve) insufficiency: Secondary | ICD-10-CM

## 2020-07-31 LAB — POC FINGERSTICK GLUCOSE - BMC/JMC (RESULTS)
GLUCOSE, POC: 155 mg/dL — ABNORMAL HIGH (ref 60–100)
GLUCOSE, POC: 191 mg/dL — ABNORMAL HIGH (ref 60–100)
GLUCOSE, POC: 223 mg/dL — ABNORMAL HIGH (ref 60–100)
GLUCOSE, POC: 264 mg/dL — ABNORMAL HIGH (ref 60–100)

## 2020-07-31 LAB — CBC W/AUTO DIFF
BASOPHIL #: 0 10*3/uL (ref 0.00–0.10)
BASOPHIL %: 0 % (ref 0–3)
EOSINOPHIL #: 0 10*3/uL (ref 0.00–0.50)
EOSINOPHIL %: 0 % (ref 0–5)
HCT: 35.1 % — ABNORMAL LOW (ref 36.0–45.0)
HGB: 11.9 g/dL — ABNORMAL LOW (ref 12.0–15.5)
LYMPHOCYTE #: 0.6 10*3/uL — ABNORMAL LOW (ref 1.00–4.80)
LYMPHOCYTE %: 4 % — ABNORMAL LOW (ref 15–43)
MCH: 29 pg (ref 27.5–33.2)
MCHC: 34 g/dL (ref 32.0–36.0)
MCV: 85.2 fL (ref 82.0–97.0)
MONOCYTE #: 0.8 10*3/uL (ref 0.20–0.90)
MONOCYTE %: 5 % (ref 5–12)
MPV: 8.9 fL (ref 7.4–10.5)
NEUTROPHIL #: 15.8 10*3/uL — ABNORMAL HIGH (ref 1.50–6.50)
NEUTROPHIL %: 92 % — ABNORMAL HIGH (ref 43–76)
PLATELETS: 127 10*3/uL — ABNORMAL LOW (ref 150–450)
RBC: 4.12 10*6/uL (ref 4.00–5.10)
RDW: 15.4 % (ref 11.0–16.0)
WBC: 17.2 10*3/uL — ABNORMAL HIGH (ref 4.0–11.0)

## 2020-07-31 LAB — BASIC METABOLIC PANEL
ANION GAP: 8 mmol/L (ref 4–13)
BUN/CREA RATIO: 17 (ref 6–22)
BUN: 10 mg/dL (ref 8–25)
CALCIUM: 8.9 mg/dL (ref 8.8–10.2)
CHLORIDE: 101 mmol/L (ref 96–111)
CO2 TOTAL: 22 mmol/L — ABNORMAL LOW (ref 23–31)
CREATININE: 0.6 mg/dL (ref 0.60–1.05)
ESTIMATED GFR: >90 mL/min/BSA (ref >=60)
GLUCOSE: 145 mg/dL — ABNORMAL HIGH (ref 65–125)
POTASSIUM: 4.1 mmol/L (ref 3.5–5.1)
SODIUM: 131 mmol/L — ABNORMAL LOW (ref 136–145)

## 2020-07-31 LAB — ECG 12-LEAD
Atrial Rate: 96 {beats}/min
Calculated P Axis: 35 degrees
Calculated R Axis: -22 degrees
PR Interval: 164 ms
QRS Duration: 74 ms
QT Interval: 338 ms
QTC Calculation: 427 ms
Ventricular rate: 96 {beats}/min

## 2020-07-31 LAB — PTT (PARTIAL THROMBOPLASTIN TIME)
APTT: 101.6 s (ref 25.1–36.5)
APTT: 68.3 s — ABNORMAL HIGH (ref 25.1–36.5)
APTT: 63.3 s — ABNORMAL HIGH (ref 25.1–36.5)

## 2020-07-31 NOTE — Nurses Notes (Signed)
I spoke to pt's HCS / Brother [name] , and did advise that Attending had wanted to discuss on DC plans. Privacy code extended to [facility]

## 2020-07-31 NOTE — Progress Notes (Signed)
IP PROGRESS NOTE      Carrie Escobar  Date of Admission:  07/29/2020  Date of Birth:  [birth year]  Date of Service:  07/31/2020    Chief Complaint:  Follow-up for multiple pulmonary emboli  Subjective:  Altered mental status/chest pain    Patient with history of recent diagnosis of brain cancer (glioblastoma ) comes in with chest pain and found out to have multiple pulmonary embolism/ infarction currently on heparin drip. Seen by  Oncology recommended DOAC if unable to DC with LMWH.  echocardiogram reading pending.  Hemodynamically stable.    Problem List:  Active Hospital Problems   (*Primary Problem)    Diagnosis    *Multiple pulmonary emboli (CMS HCC)    GBM (glioblastoma multiforme) (CMS HCC)    Pulmonary infarction (CMS HCC)    DM (diabetes mellitus), type 2 (CMS HCC)    Memory impairment     Assessment/ Plan:       1-Multiple pulmonary emboli/ Pulmonary infarction  Provoked by malignancy  given the finding of significant clot burden and possibility of right heart strain patient will be continued with heparin drip, echocardiogram is pending  Hematology-Oncology input appreciated  hemodynamically stable, no indication for thrombolysis  XRT and chemotherapy as outpatient    2-GBM  multifocal, high-grade  planned whole-brain radiation and systemic chemotherapy  Advised to follow-up with Oncology as outpatient    3-Memory impairment/altered mental status  patient appears to have some significant cognitive and memory impairment, reportedly related to recent diagnosis of brain tumors  Does not have decisional capacity  may consider further evaluation by Neurology    4-Diabetes mellitus type 2  SSI protocol  goal glucose 120-180 while hospitalized  monitor closely with steroid therapy    DVT/PE Prophylaxis: Heparin  Code status:  Full code  Brother is Media planner.    Vital Signs:  Temp (24hrs) Max:37 C (98.6 F)      Temperature: 36.4 C (97.5 F)  BP (Non-Invasive): 115/77  MAP (Non-Invasive):  86 mmHG  Heart Rate: 75  Respiratory Rate: 16  SpO2: 99 %      Objective     Current Medications:  acetaminophen (TYLENOL) tablet, 650 mg, Oral, Q6H PRN  amLODIPine (NORVASC) tablet, 10 mg, Oral, Daily  aspirin (ECOTRIN) enteric coated tablet 81 mg, 81 mg, Oral, Daily  calcium carbonate-vitamin D3 1250mg  (500mg )-200 units per tablet, 1 Tablet, Oral, Daily  dexAMETHasone (DECADRON) tablet, 4 mg, Oral, Q12H  SSIP insulin lispro (HUMALOG) 100 units/mL SubQ pen, 1-12 Units, Subcutaneous, 4x/day AC   And  dextrose 50% (0.5 g/mL) injection - syringe, 12.5 g, Intravenous, Q15 Min PRN  fluticasone (FLONASE) 50 mcg per spray nasal spray, 1 Spray, Each Nostril, Daily  heparin 25,000 units in 0.45% NS 250 mL infusion, 17.24 Units/kg/hr (Adjusted), Intravenous, Continuous  heparin 5,000 units/mL injection IV bolus, 25 Units/kg (Adjusted), Intravenous, Once  levETIRAcetam (KEPPRA) tablet, 1,000 mg, Oral, 2x/day  LORazepam (ATIVAN) 2 mg/mL injection, 0.5 mg, Intravenous, Q4H PRN  morphine 4 mg/mL injection, 4 mg, Intravenous, Q5 Min PRN  nitroGLYCERIN (NITROSTAT) sublingual tablet, 0.4 mg, Sublingual, Q5 Min PRN  NS 250 mL flush bag, , Intravenous, Q1H PRN  NS flush syringe, 10 mL, Intravenous, Q8HRS  NS flush syringe, 10 mL, Intravenous, Q1H PRN  ondansetron (ZOFRAN) 2 mg/mL injection, 4 mg, Intravenous, Q6H PRN  timolol (TIMOPTIC) 0.25% ophthalmic solution, 1 Drop, Both Eyes, 2x/day  traMADol (ULTRAM) tablet, 50 mg, Oral, Q6H PRN        Today's  Physical Exam:  General: appears pleasantly confused ,chronically sick looking . No distress.   Eyes: Pupils equal and round, reactive to light and accomodation.   HENT:Head atraumatic and normocephalic   Neck: No JVD or thyromegaly or lymphadenopathy   Lungs: CTAB, non labored breathing, no rales or wheezing.    Cardiovascular: regular rate and rhythm, S1, S2 normal, no murmur,   Abdomen: Soft, non-tender, Bowel sounds normal, No hepatosplenomegaly   Extremities: extremities normal,  atraumatic, no cyanosis or edema   Skin: Skin warm and dry   Neurologic:  Pleasantly confused  Psychiatric:  Apprehensive    I/O:  I/O last 24 hours:      Intake/Output Summary (Last 24 hours) at 07/31/2020 1324  Last data filed at 07/31/2020 1000  Gross per 24 hour   Intake 290 ml   Output --   Net 290 ml     I/O current shift:  10/22 0700 - 10/22 1859  In: 240 [P.O.:240]  Out: -       Labs  Please indicate ordered or reviewed)  Reviewed: I have reviewed all lab results.    Radiology Tests (Please indicate ordered or reviewed)  Reviewed: personally reviewed all available images    Medical decision making:  All pertinent labs were personally reviewed with additional follow-up lab work ordered as deemed clinically appropriate.  All available imaging results and EKGs were reviewed and independently interpreted.  Pertinent previous medical record results were reviewed.  Further workup and treatment depending on clinical cours      Portions of this note may be dictated using voice recognition software or a dictation service. Variances in spelling and vocabulary are possible and unintentional. Not all errors are caught/corrected. Please notify the [facility] if any discrepancies are noted or if the meaning of any statement is not clear.

## 2020-07-31 NOTE — Care Management Notes (Signed)
New patient.  Chart review for status, needs, and consults.  Patient remains on heparin gtt, and continues treatment.  Patient will need [facility] resumption prior to discharge.

## 2020-08-01 LAB — CBC W/AUTO DIFF
BASOPHIL #: 0 10*3/uL (ref 0.00–0.10)
BASOPHIL %: 0 % (ref 0–3)
EOSINOPHIL #: 0 10*3/uL (ref 0.00–0.50)
EOSINOPHIL %: 0 % (ref 0–5)
HCT: 31.7 % — ABNORMAL LOW (ref 36.0–45.0)
HGB: 10.9 g/dL — ABNORMAL LOW (ref 12.0–15.5)
LYMPHOCYTE #: 0.7 10*3/uL — ABNORMAL LOW (ref 1.00–4.80)
LYMPHOCYTE %: 4 % — ABNORMAL LOW (ref 15–43)
MCH: 29.1 pg (ref 27.5–33.2)
MCHC: 34.2 g/dL (ref 32.0–36.0)
MCV: 85 fL (ref 82.0–97.0)
MONOCYTE #: 0.8 10*3/uL (ref 0.20–0.90)
MONOCYTE %: 5 % (ref 5–12)
MPV: 8.9 fL (ref 7.4–10.5)
NEUTROPHIL #: 15.6 10*3/uL — ABNORMAL HIGH (ref 1.50–6.50)
NEUTROPHIL %: 91 % — ABNORMAL HIGH (ref 43–76)
PLATELETS: 138 10*3/uL — ABNORMAL LOW (ref 150–450)
RBC: 3.73 10*6/uL — ABNORMAL LOW (ref 4.00–5.10)
RDW: 15.3 % (ref 11.0–16.0)
WBC: 17.1 10*3/uL — ABNORMAL HIGH (ref 4.0–11.0)

## 2020-08-01 LAB — POC FINGERSTICK GLUCOSE - BMC/JMC (RESULTS)
GLUCOSE, POC: 164 mg/dL — ABNORMAL HIGH (ref 60–100)
GLUCOSE, POC: 239 mg/dL — ABNORMAL HIGH (ref 60–100)
GLUCOSE, POC: 222 mg/dL — ABNORMAL HIGH (ref 60–100)
GLUCOSE, POC: 266 mg/dL — ABNORMAL HIGH (ref 60–100)

## 2020-08-01 LAB — PTT (PARTIAL THROMBOPLASTIN TIME)
APTT: 62.2 s — ABNORMAL HIGH (ref 25.1–36.5)
APTT: 63 s — ABNORMAL HIGH (ref 24.0–36.5)

## 2020-08-01 LAB — BASIC METABOLIC PANEL
ANION GAP: 6 mmol/L (ref 4–13)
BUN/CREA RATIO: 21 (ref 6–22)
BUN: 13 mg/dL (ref 8–25)
CALCIUM: 9.1 mg/dL (ref 8.8–10.2)
CHLORIDE: 100 mmol/L (ref 96–111)
CO2 TOTAL: 26 mmol/L (ref 23–31)
CREATININE: 0.61 mg/dL (ref 0.60–1.05)
ESTIMATED GFR: >90 mL/min/BSA (ref >=60)
GLUCOSE: 150 mg/dL — ABNORMAL HIGH (ref 65–125)
POTASSIUM: 4.4 mmol/L (ref 3.5–5.1)
SODIUM: 132 mmol/L — ABNORMAL LOW (ref 136–145)

## 2020-08-01 LAB — MAGNESIUM: MAGNESIUM: 2.2 mg/dL (ref 1.8–2.6)

## 2020-08-01 MED ORDER — APIXABAN 5 MG TABLET
5.0000 mg | ORAL_TABLET | Freq: Two times a day (BID) | ORAL | Status: DC
Start: 2020-08-08 — End: 2020-08-02

## 2020-08-01 MED ORDER — APIXABAN 5 MG TABLET
10.0000 mg | ORAL_TABLET | Freq: Two times a day (BID) | ORAL | Status: DC
Start: 2020-08-01 — End: 2020-08-02
  Administered 2020-08-01 – 2020-08-02 (×4): 10 mg via ORAL
  Filled 2020-08-01 (×4): qty 2

## 2020-08-01 NOTE — Progress Notes (Signed)
[facility]  Oncology Consult  Follow Up Note    Carrie Escobar, Carrie Escobar, 74 y.o. female  Date of Service: 08/01/2020  Date of Birth:  05/18/1946    Hospital Day:  LOS: 3 days     Assessment: Kirsta Escobar is a 74 y.o. female with MGMT unmethylated, 1p loss, 19q intact unresectable GBM planned for chemoRT with temozolomide, now admitted with pulmonary embolism with significant clot burden and possible right heat strain.  Echo shows RV pressure at the upper limit of normal. She is confused at baseline after initial presentation, but remembered meeting me on admission.  She will need to see both radiation oncology and oncology this week after DC prior to treatment.  She remains on steroids and anti-seizure medication at this time.    Heme/Onc Diagnosis:   provoked PE  GBM    Recommendations:   -Patient would not be able to administer LMWH at home, family has a fear of needles and is attempting to find someone to help live with her due to distance.    -discussion held on DOACs being a reasonable alternative in cancer patients with brain mets who require anticoagulation, though there is no comparative data with risk assessments first hand.  -will coordinate chemoRT in the outpatient setting, planned follow up with primary oncologist, Dr. Oletta Darter, and radiation oncologist, Dr. Elpidio Galea on discharge  -no current absolute contraindications to anticoagulation, BP stable, no acute bleed, no surgical interventions in the last 14d  -stable on [facility] for 48h prior to conversion to DOAC, she will be monitored an additional 24h prior to DC    Will sign off, please call with any questions  Carrie Hurl, MD     ________________________________________________________________________________________________________________________________________________     Subjective: Patient seen at bedside with brother.  She no longer has chest pain, breathing well.  Diagnosis and treatment plan reviewed as outlined in the  A/P.  Goals of care discussion held.  Brother lives in [place], [place], and is trying to get a first cousin, a Marine scientist, to live with patient during her treatment.    ROS:     Review of Systems   Constitutional: Positive for fatigue.   Respiratory: Negative for chest tightness and shortness of breath.    Cardiovascular: Negative for chest pain and palpitations.   Musculoskeletal: Positive for gait problem.   Neurological: Positive for gait problem, headaches and light-headedness.   Psychiatric/Behavioral: Positive for confusion.   All other systems reviewed and are negative.       Objective   Temperature: 36.7 C (98.1 F)  Heart Rate: 58  BP (Non-Invasive): 116/60  Respiratory Rate: 16  SpO2: 97 %  Physical Exam  Vitals reviewed.   Constitutional:       Appearance: Normal appearance.   Cardiovascular:      Rate and Rhythm: Normal rate and regular rhythm.   Pulmonary:      Effort: Pulmonary effort is normal.      Breath sounds: Normal breath sounds.   Abdominal:      Palpations: Abdomen is soft.      Tenderness: There is no abdominal tenderness.   Musculoskeletal:         General: No swelling.   Skin:     General: Skin is warm and dry.   Neurological:      Mental Status: She is alert. Mental status is at baseline.      Comments: some confusion, word finding difficulties which frustrate her   Psychiatric:  Mood and Affect: Mood normal.          Labs:  I have reviewed all lab results.    Imaging Studies:  reviewed pertinent images in Epic          Carrie Hurl, MD

## 2020-08-01 NOTE — Progress Notes (Signed)
IP PROGRESS NOTE      Carrie Escobar  Date of Admission:  07/29/2020  Date of Birth:  June 23, 1946  Date of Service:  08/01/2020    Chief Complaint:  Follow-up for multiple pulmonary emboli  Subjective:  Altered mental status/chest pain    Heparin drip switched to Eliquis after discussing treatment options with brother ,  caregiver for the patient  Will consider discharge in the next 24 hours with Eliquis and outpatient follow-up with Oncology Hematology    Problem List:  Active Hospital Problems   (*Primary Problem)    Diagnosis    *Multiple pulmonary emboli (CMS HCC)    GBM (glioblastoma multiforme) (CMS HCC)    Pulmonary infarction (CMS [place])    DM (diabetes mellitus), type 2 (CMS [place])    Memory impairment     Assessment/ Plan:     1-Multiple pulmonary emboli/ Pulmonary infarction  Provoked by malignancy  Echocardiogram reviewed, heart strain  Heparin switched to Eliquis.  Hematology-Oncology input appreciated  hemodynamically stable, no indication for thrombolysis  XRT and chemotherapy as outpatient    2-GBM  multifocal, high-grade  planned whole-brain radiation and systemic chemotherapy  Advised to follow-up with Oncology as outpatient    3-Memory impairment/altered mental status  patient appears to have some significant cognitive and memory impairment, reportedly related to recent diagnosis of brain tumors  Does not have decisional capacity    4-Diabetes mellitus type 2  SSI protocol  goal glucose 120-180 while hospitalized  monitor closely with steroid therapy    DVT/PE Prophylaxis:  Eliquis  Code status:  Full code  Brother is Media planner.    Vital Signs:  Temp (24hrs) Max:37 C (98.6 F)      Temperature: 37 C (98.6 F)  BP (Non-Invasive): 128/71  MAP (Non-Invasive): 85 mmHG  Heart Rate: 64  Respiratory Rate: 16  SpO2: 99 %      Objective     Current Medications:  acetaminophen (TYLENOL) tablet, 650 mg, Oral, Q6H PRN  amLODIPine (NORVASC) tablet, 10 mg, Oral, Daily  apixaban (ELIQUIS)  tablet, 10 mg, Oral, 2x/day   Followed by  Derrill Memo ON 08/08/2020] apixaban (ELIQUIS) tablet, 5 mg, Oral, 2x/day  aspirin (ECOTRIN) enteric coated tablet 81 mg, 81 mg, Oral, Daily  calcium carbonate-vitamin D3 1250mg  (500mg )-200 units per tablet, 1 Tablet, Oral, Daily  dexAMETHasone (DECADRON) tablet, 4 mg, Oral, Q12H  SSIP insulin lispro (HUMALOG) 100 units/mL SubQ pen, 1-12 Units, Subcutaneous, 4x/day AC   And  dextrose 50% (0.5 g/mL) injection - syringe, 12.5 g, Intravenous, Q15 Min PRN  fluticasone (FLONASE) 50 mcg per spray nasal spray, 1 Spray, Each Nostril, Daily  levETIRAcetam (KEPPRA) tablet, 1,000 mg, Oral, 2x/day  LORazepam (ATIVAN) 2 mg/mL injection, 0.5 mg, Intravenous, Q4H PRN  morphine 4 mg/mL injection, 4 mg, Intravenous, Q5 Min PRN  nitroGLYCERIN (NITROSTAT) sublingual tablet, 0.4 mg, Sublingual, Q5 Min PRN  NS 250 mL flush bag, , Intravenous, Q1H PRN  NS flush syringe, 10 mL, Intravenous, Q8HRS  NS flush syringe, 10 mL, Intravenous, Q1H PRN  ondansetron (ZOFRAN) 2 mg/mL injection, 4 mg, Intravenous, Q6H PRN  timolol (TIMOPTIC) 0.25% ophthalmic solution, 1 Drop, Both Eyes, 2x/day  traMADol (ULTRAM) tablet, 50 mg, Oral, Q6H PRN        Today's Physical Exam:  General: appears pleasantly confused ,chronically sick looking . No distress.   Eyes: Pupils equal and round, reactive to light and accomodation.   HENT:Head atraumatic and normocephalic   Neck: No JVD or thyromegaly or lymphadenopathy  Lungs: CTAB, non labored breathing, no rales or wheezing.    Cardiovascular: regular rate and rhythm, S1, S2 normal, no murmur,   Abdomen: Soft, non-tender, Bowel sounds normal, No hepatosplenomegaly   Extremities: extremities normal, atraumatic, no cyanosis or edema   Skin: Skin warm and dry   Neurologic:  Pleasantly confused  Psychiatric:  Apprehensive    I/O:  I/O last 24 hours:      Intake/Output Summary (Last 24 hours) at 08/01/2020 1102  Last data filed at 08/01/2020 0900  Gross per 24 hour   Intake 420 ml    Output 800 ml   Net -380 ml     I/O current shift:  10/23 0700 - 10/23 1859  In: 120 [P.O.:120]  Out: 400 [Urine:400]      Labs  Please indicate ordered or reviewed)  Reviewed: I have reviewed all lab results.    Radiology Tests (Please indicate ordered or reviewed)  Reviewed: personally reviewed all available images    Medical decision making:  All pertinent labs were personally reviewed with additional follow-up lab work ordered as deemed clinically appropriate.  All available imaging results and EKGs were reviewed and independently interpreted.  Pertinent previous medical record results were reviewed.  Further workup and treatment depending on clinical cours      Portions of this note may be dictated using voice recognition software or a dictation service. Variances in spelling and vocabulary are possible and unintentional. Not all errors are caught/corrected. Please notify the [facility] if any discrepancies are noted or if the meaning of any statement is not clear.

## 2020-08-01 NOTE — Care Plan (Signed)
Pt updated on POC, verbalized understanding  denies any questions or concerns

## 2020-08-02 LAB — POC FINGERSTICK GLUCOSE - BMC/JMC (RESULTS)
GLUCOSE, POC: 186 mg/dL — ABNORMAL HIGH (ref 60–100)
GLUCOSE, POC: 305 mg/dL — ABNORMAL HIGH (ref 60–100)

## 2020-08-02 MED ORDER — APIXABAN 5 MG TABLET
10.0000 mg | ORAL_TABLET | Freq: Two times a day (BID) | ORAL | 0 refills | Status: DC
Start: 2020-08-02 — End: 2020-08-11

## 2020-08-02 MED ORDER — LEVETIRACETAM 1,000 MG TABLET
1000.0000 mg | ORAL_TABLET | Freq: Two times a day (BID) | ORAL | 0 refills | Status: DC
Start: 2020-08-02 — End: 2020-08-26

## 2020-08-02 MED ORDER — APIXABAN 5 MG TABLET
5.0000 mg | ORAL_TABLET | Freq: Two times a day (BID) | ORAL | 2 refills | Status: DC
Start: 2020-08-08 — End: 2020-09-25

## 2020-08-02 NOTE — Care Management Notes (Addendum)
08/01/20 0843  IP CONSULT TO CARE MANAGEMENT ([facility] - DO NOT USE FOR BEH HEALTH PATIENTS)  ONE TIME     Complete  Discontinue     Process Instructions: If requesting assistance with completion of Advance Directives, please provide patient with the packet.   References:ON CALL ([facility])   Provider: (Not yet assigned)   Question: Indication For Consult: Answer: D/C NEEDS - MEDICATION Comment: ELIQUIS price        CM [facility].  Cost is $93/month.  There are currently out & can fill rx tomorrow.  Dr. Leonides Grills advised.  Pharmacist indicated the 30 day Free manufacturer's coupon is not good on an on-going rx.  She also advised this could be sent to a pharmacy of the pt choosing today.

## 2020-08-02 NOTE — Care Plan (Signed)
Problem: Adult Inpatient Plan of Care  Goal: Plan of Care Review  Outcome: Ongoing (see interventions/notes)  Goal: Patient-Specific Goal (Individualized)  Outcome: Ongoing (see interventions/notes)  Goal: Absence of Hospital-Acquired Illness or Injury  Outcome: Ongoing (see interventions/notes)  Goal: Optimal Comfort and Wellbeing  Outcome: Ongoing (see interventions/notes)  Goal: Rounds/Family Conference  Outcome: Ongoing (see interventions/notes)     Problem: Skin Injury Risk Increased  Goal: Skin Health and Integrity  Outcome: Ongoing (see interventions/notes)     Problem: Fall Injury Risk  Goal: Absence of Fall and Fall-Related Injury  Outcome: Ongoing (see interventions/notes)

## 2020-08-02 NOTE — Discharge Summary (Signed)
[facility]    DISCHARGE SUMMARY      PATIENT NAME:  Carrie Escobar, Carrie Escobar  MRN:  [id number]  DOB:  [birth year]    ENCOUNTER START DATE:  07/29/2020  INPATIENT ADMISSION DATE: 07/29/2020  DISCHARGE DATE:  08/02/2020    ATTENDING PHYSICIAN: Peniel Biel Claretta Fraise, *  PRIMARY CARE PHYSICIAN: Kayleen Memos, MD     ADMISSION DIAGNOSIS: Multiple pulmonary emboli (CMS HCC)  No chief complaint on file.      DISCHARGE DIAGNOSIS:   Multiple pulmonary emboli  Glioblastoma  Impaired memory  History of diabetes mellitus    Principal Problem:  Multiple pulmonary emboli (CMS [facility])    Active Hospital Problems    Diagnosis Date Noted    Principle Problem: Multiple pulmonary emboli (CMS HCC) [I26.99] 07/29/2020    GBM (glioblastoma multiforme) (CMS HCC) [C71.9] 07/29/2020    Pulmonary infarction (CMS [place]) [I26.99] 07/29/2020    DM (diabetes mellitus), type 2 (CMS [place]) [E11.9] 07/29/2020    Memory impairment [R41.3] 06/27/2020      Resolved Hospital Problems   No resolved problems to display.     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      Patient Active Problem List   Diagnosis Code    Shoulder pain, right M25.511    Lipoma of shoulder D17.20    Rotator cuff syndrome of right shoulder M75.101    Senile nuclear sclerosis H25.10    Routine general medical examination at a health care facility Z00.00    Dependent edema R60.9    Atypical chest pain R07.89    Trigger finger of right hand M65.30    Brain mass G93.89    Memory impairment R41.3    Acute metabolic encephalopathy O13.08     Multiple pulmonary emboli (CMS HCC) I26.99    GBM (glioblastoma multiforme) (CMS HCC) C71.9    Pulmonary infarction (CMS HCC) I26.99    DM (diabetes mellitus), type 2 (CMS HCC) E11.9       Allergies   Allergen Reactions    Amoxicillin Rash        DISCHARGE MEDICATIONS:     Current Discharge Medication List      START taking these medications.      Details   apixaban 5 mg Tablet  Commonly known as: ELIQUIS   10 mg, Oral, 2 TIMES DAILY  Qty: 22 Tablet  Refills: 0        CONTINUE these medications which have CHANGED during your visit.      Details   * temozolomide 5 mg Capsule  Commonly known as: TEMODAR  What changed: additional instructions   10 mg, Oral, DAILY  Qty: 84 Capsule  Refills: 0     * temozolomide 20 mg Capsule  Commonly known as: TEMODAR  What changed: Another medication with the same name was changed. Make sure you understand how and when to take each.   20 mg, Oral, DAILY  Qty: 42 Capsule  Refills: 0     * temozolomide 100 mg Capsule  Commonly known as: TEMODAR  What changed: Another medication with the same name was changed. Make sure you understand how and when to take each.   100 mg, Oral, DAILY  Qty: 42 Capsule  Refills: 0     traMADoL 50 mg Tablet  Commonly known as: ULTRAM  What changed: reasons to take this   50 mg, Oral, EVERY 6 HOURS PRN  Qty: 10 Tablet  Refills: 0     * trimethoprim-sulfamethoxazole 800-160 mg Tablet  Commonly known as: BACTRIM DS  What changed: additional instructions   160 mg, Oral, EVERY MO, WE AND FR  Qty: 18 Tablet  Refills: 0     * trimethoprim-sulfamethoxazole 800-160 mg Tablet  Commonly known as: BACTRIM DS  What changed: Another medication with the same name was changed. Make sure you understand how and when to take each.   160 mg, Oral, EVERY MO, WE AND FR  Qty: 18 Tablet  Refills: 0         * This list has 5 medication(s) that are the same as other medications prescribed for you. Read the directions carefully, and ask your doctor or other care provider to  review them with you.            CONTINUE these medications - NO CHANGES were made during your visit.      Details   amLODIPine 10 mg Tablet  Commonly known as: NORVASC   TAKE 1 TABLET BY MOUTH EVERY DAY  Qty: 90 Tablet  Refills: 1     aspirin 81 mg Tablet, Delayed Release (E.C.)  Commonly known as: ECOTRIN   81 mg, Oral, DAILY  Qty: 90 Tablet  Refills: 3     calcium citrate-vitamin D3 200 mg-6.25 mcg (250 unit) Tablet  Commonly known as: CITRACAL   1 Tablet, Oral, DAILY  Qty: 90 Tablet  Refills: 3     dexAMETHasone 2 mg Tablet  Commonly known as: DECADRON   Take 2 tabs every 8 hours for 5 days; then 1 tab every 8 hours for 5 days; then 1 tab every 12 hours for 5 days; then 1 tab every day for 5 days and stop.  Qty: 60 Tablet  Refills: 0     Flaxseed Oil 1,000 mg Capsule   Oral  Refills: 0     fluticasone propionate 50 mcg/actuation Spray, Suspension  Commonly known as: FLONASE   1 Spray, Each Nostril, 2 TIMES DAILY PRN  Qty: 3 Each  Refills: 0     levETIRAcetam 1,000 mg Tablet  Commonly known as: KEPPRA   1,000 mg, Oral, 2 TIMES DAILY  Qty: 60 Tablet  Refills: 0     metFORMIN 500 mg Tablet Sustained Release 24 hr  Commonly known as: GLUCOPHAGE XR   500 mg, Oral, DAILY  Qty: 90 Tablet  Refills: 1     multivitamin Tablet   1 Tablet, DAILY  Refills: 0     ondansetron 8 mg Tablet, Rapid Dissolve  Commonly known as: ZOFRAN ODT   8 mg, Sublingual, EVERY 8 HOURS PRN  Qty: 90 Tablet  Refills: 2     timoloL maleate 0.25 % Drops  Commonly known as: TIMOPTIC   1 Drop, Both Eyes, 2 TIMES DAILY  Refills: 0            DISCHARGE INSTRUCTIONS:      DISCHARGE INSTRUCTION - DIABETIC DIET     Diet: DIABETIC DIET      DISCHARGE INSTRUCTION - ACTIVITY  Activity: AS TOLERATED      ASPIRIN ALREADY ORDERED     FOLLOW-UP: HEMATOLOGY/ONCOLOGY - [place], [place]     Follow-up in: 7 DAYS    Reason for visit: [place] COURSE: This is a 74 y.o.,    28  American female who presents with complaint of severe chest pain   According to family she had an episode of fecal incontinence and after cleaning herself up developed upper chest pain and mild shortness of breath.  She was recently diagnosed with multifocal GBM and underwent brain biopsy which showed necrotic GBM.  She was scheduled to initiate chemotherapy and XRT .  There is no history of DVT or previous clotting disorder that she can recall or noted within the EMR.  ED evaluation included a CT pulmonary angiogram which demonstrated multiple segmental and subsegmental PEs was some evidence of possible right heart strain.   Patient was admitted with multiple pulmonary emboli for anticoagulation.  She was started with heparin drip and seen in consultation by Hematology-Oncology for outpatient anticoagulation recommendation.  Low-molecular weight heparin would be appropriate choice,NOAC being 2nd choice.  After extensive discussion with brother, he wants her to be Eliquis.  [facility]  Advised to follow-up with Oncology for chemoradiation  Acceptable vital signs at time of discharge.    SIGNIFICANT PHYSICAL FINDINGS:   SIGNIFICANT LAB:   SIGNIFICANT RADIOLOGY:   CONSULTATIONS:  Hematology-Oncology  PROCEDURES PERFORMED:         [place]:  Please see above    DOES PATIENT HAVE ADVANCED DIRECTIVES:       ADVANCED CARE PLANNING - POST form discussed for  30 minutes with patient and/or appropriate decision maker - not desired at this time    CONDITION ON DISCHARGE: Confused    DISCHARGE DISPOSITION:  Home discharge     Copies sent to Care Team       Relationship Specialty Notifications Start End    Kayleen Memos, MD PCP - [facility]  02/26/13     Phone: [phone] Fax: [phone]         [address] [place] [id number]    Eligha Bridegroom, RN   Admissions 07/22/20

## 2020-08-03 ENCOUNTER — Other Ambulatory Visit: Payer: Self-pay

## 2020-08-03 ENCOUNTER — Other Ambulatory Visit (HOSPITAL_COMMUNITY): Payer: Self-pay

## 2020-08-03 ENCOUNTER — Telehealth (HOSPITAL_COMMUNITY): Payer: Self-pay

## 2020-08-03 NOTE — Telephone Encounter (Signed)
Called patients brother at the request of Dr. Oletta Darter.  IR Biopsy scheduled for this Wednesday will need to be delayed due to new diagnosis of PE's.  Patients was started on Blood Thinner during hospitalization.  IR Scheduling made aware.  [name] denies additional questions and we reviewed upcoming appointments.  Return phone number provided.

## 2020-08-04 ENCOUNTER — Other Ambulatory Visit: Payer: Medicare PPO

## 2020-08-04 ENCOUNTER — Other Ambulatory Visit: Payer: Self-pay

## 2020-08-04 ENCOUNTER — Ambulatory Visit: Payer: Self-pay

## 2020-08-04 MED ORDER — DEXAMETHASONE 2 MG TABLET
2.0000 mg | ORAL_TABLET | Freq: Two times a day (BID) | ORAL | Status: DC
Start: 2020-07-28 — End: 2020-09-25

## 2020-08-04 NOTE — Case Communication (Signed)
Yes, he, Carrie Escobar, [place],  is currently residing with her and providing full care and oversight.

## 2020-08-04 NOTE — Case Communication (Signed)
Carrie Escobar:    With this ROC, do you assess that she is safe to shower?  Brother [name] wants her to shower and is looking forward to [place] visits, that mother enjoys them. No longer SOB or CP.    Helene Kelp RN    cc: Markus Daft [facility]    Recent [facility] admission, discharged 10/24: Multiple pulmonary emboli. Confused, does not have decisional capacity, pulmonary infarction, Stage IV Glioblastoma with Malignancy,  Impaired memory, confused, gait issues, right shoulder pain, hx DM II metformin.   Welcomed into the tidy one story home by brother [name], [place] who is currently residing with patient during this acute period.  Providing all care well

## 2020-08-04 NOTE — Home Health (Signed)
SN ROC 08/04/2020  Travel screening, fully vaccinated, caregiver fully vaccinated  Medicare 100% coverage    Recent [facility] admission, discharged 10/24: Multiple pulmonary emboli d/t malignancy. Confused is usual baseline now, does not have decisional capacity, pulmonary infarction, Stage IV Glioblastoma with malignancy,  Impaired memory, cannot even name her brother, confused, gait issues, right shoulder pain, hx DM II metformin.   Welcomed into the tidy one story home by brother [name], active MPOA who is currently residing with patient during this acute period.  Providing all care well.  In conversation, he said that symptoms were noticed 06/27/2020; now patient is totally confused d/t glioblastoma w/malignancy. See V.S. Assm., Assm comments.   Diabetic diet.  Walker. [facility] Echocardiogram=heart strain now heart monitor has been applied, in place, will end soon. Large hematoma (hx anticoalagultion) @ [facility]  noted to [place], see Media. Suggested warm moist compresses for relief of discomfort. MD msg. Brother [name] mentioned the cost of Eliquis - Case Mgr [facility] wrote she reviewed cost @ Eliquis $93/m   Pt does not have decisional capacity, Brother [name] is primary caregiver & MPOA at this time.   [facility] did sign that patient Lacks Capacity. Chemo and radiation to begin Th. Significant teaching/support to brother [name] and sister.Med teaching re: Eliquis, Decadron, malignancy and PE. Touched very lightly on subject of Pallative Care and/or Hospice potential. Looking forward to [facility] and shower assistance, pt enjoys Latayna's visits. Makes her laugh.  F/U appts discussed.   Wed 8:30a   Th chemo 3p, Radiation 4p     P.T. following SN ROC visit today 08/04/20.Marland Kitchen

## 2020-08-04 NOTE — Case Communication (Signed)
[place] care resumption following [facility] d/c for multiple P.E.

## 2020-08-04 NOTE — Case Communication (Signed)
Active MPOA, brother Ahyana Skillin. Documented that patient lacks capacity.

## 2020-08-04 NOTE — Case Communication (Signed)
Pt has an approx 7 cm bruise at left antecubital site from lab or IV at discharge from [facility]. See Media for photo.  Suggested warm compress for symptoms. Was on anticoagulation therapy for multiple P.E.   Trevor Mace, RN  [place]

## 2020-08-04 NOTE — Home Health (Signed)
PT assessment:  Patient lying on couch, finishing visit with nurse, upon arrival of PT.  patient recently discharged from the hospital after 5 PEs.  patient demonstrating Tinetti 18/28, strength 3+/5, CG for transfers and ambulation with fww.  Patient limited due to fatigue.  patient refusing any treatment today due to fatigue and wanting to eat lunch.  PT encouraged patient to walk hourly during the day with fww and brother to help prevent blood clots, help deeper breathing of the lungs, and start building endurance and strength again.  Patient and brother state understanding.  Patient would benefit from Physical Therapy intervention to build strength and balance, improve gait and endurance.

## 2020-08-05 ENCOUNTER — Other Ambulatory Visit: Payer: Self-pay

## 2020-08-05 ENCOUNTER — Other Ambulatory Visit (HOSPITAL_COMMUNITY): Payer: Self-pay

## 2020-08-05 ENCOUNTER — Ambulatory Visit: Payer: Medicare PPO | Attending: Internal Medicine | Admitting: Internal Medicine

## 2020-08-05 ENCOUNTER — Other Ambulatory Visit: Payer: Medicare PPO

## 2020-08-05 ENCOUNTER — Encounter (HOSPITAL_COMMUNITY): Payer: Medicare PPO | Admitting: Internal Medicine

## 2020-08-05 ENCOUNTER — Encounter (HOSPITAL_COMMUNITY): Payer: Self-pay | Admitting: Internal Medicine

## 2020-08-05 ENCOUNTER — Ambulatory Visit: Payer: Self-pay

## 2020-08-05 VITALS — BP 120/71 | HR 80 | Temp 97.1°F | Resp 16 | Ht 64.37 in | Wt 134.0 lb

## 2020-08-05 DIAGNOSIS — R569 Unspecified convulsions: Secondary | ICD-10-CM | POA: Insufficient documentation

## 2020-08-05 DIAGNOSIS — C719 Malignant neoplasm of brain, unspecified: Secondary | ICD-10-CM | POA: Insufficient documentation

## 2020-08-05 DIAGNOSIS — Z5111 Encounter for antineoplastic chemotherapy: Secondary | ICD-10-CM

## 2020-08-05 DIAGNOSIS — E041 Nontoxic single thyroid nodule: Secondary | ICD-10-CM | POA: Insufficient documentation

## 2020-08-05 DIAGNOSIS — I2699 Other pulmonary embolism without acute cor pulmonale: Secondary | ICD-10-CM | POA: Insufficient documentation

## 2020-08-05 DIAGNOSIS — D329 Benign neoplasm of meninges, unspecified: Secondary | ICD-10-CM | POA: Insufficient documentation

## 2020-08-05 NOTE — Progress Notes (Signed)
Return Patient Progress Note    Date: 08/05/2020    Name: Carrie Escobar  MRN: [id number]  DOB: 11-12-1945   Referring Physician: Elise Benne,*  Primary Care Provider: Kayleen Memos, MD  Reason for visit/consultation Other (f/u; provoked PE, GBM)  Comes in with her Brother    Interval History:     Carrie Escobar was admitted to the hospital between 10/20 and 10/24 for PE she was started on a heparin infusion while in the hospital, at the time of discharge was switched to Eliquis.  Unfortunately there was some confusion in the dose of Eliquis-her brother has only been giving her 1 pill in the morning 1 pill in the evening.    She complains of excessive bruising in her arms-at places where she has had her blood draws.      The main complaint today is decreased vision on the right side; she has been noticed to bump into things/Objects, and fails to recognize people appear standing on her right side.    No recent falls or injuries.  No new headaches. No specific respiratory GI GU complaints.  She has been chronically constipated-last bowel movement was more than 4 days ago.    There spoken with the nurse navigator.  They have also met with Radiation Oncology-she is due to start radiation for her GBM tomorrow. They have the Temodar in the mail, and have picked up antiemetics and Bactrim from the local pharmacy.    She continues to take her steroids daily-managed by Radiation Oncology, she also is compliant with Keppra.    The family is helping immensely with medications, clinic visits etc. Both the brother and 1 of her sisters is living with her presently. She is getting home health services.     Detailed instructions on how to take the antiemetics, followed by Temodar were given today.     ______________________________________________________________________________________________________________________    Diagnosis-GBM, diagnosed September 2021  Stage-Multifocal GBM-Unresectable.  Molecular/special  studies: MGMT-un-methylated  1p-allelic loss+, 35T: Intact  CARIS/NGS will be sent.     ___________________________________________________________  Carrie Escobar was admitted to [facility] between 9/18 and 09/29 with worsening headaches, memory loss and confusion.    MRI BRAIN :Multiple enhancing masses in the left parietal, temporal and occipital lobes which are connected by masslike T2/FLAIR signal abnormality which also extends into the splenium of the corpus callosum. 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 The largest mass extends along the  ependymal lining of the left lateral ventricle. 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    Neurosurgery, Neurology and Medical Oncology were involved in her care.    07/01/2020-Left temporal Stealth guided burr hole and tumor biopsy (Left) done by Dr Ebony Hail. The left temporal mass showed evidence of gliosis and H of lesional tissue with doses. The left temporal mass showed extensively necrotic glioma-favoring GBM grade 4.    MGMT methylation-Not detected  1p loss-detected  19q loss-Not detected    Between 10/20-and 10/24 admitted to the hospital with cough and breathing difficulty.  Was diagnosed with pulmonary embolus-and started on Eliquis at discharge.  Plan for concurrent chemo RT using Temodar as a radiosensitizer.    Cycle 1 day 08/06/2020.         _____________________________________________________________________________________________________________________    ROS:     Review of Systems   Respiratory: Positive for cough (intermittent).    All other systems reviewed and are negative.       Objective   Objective:   BP 120/71 (  Site: Right, Patient Position: Sitting)    Pulse 80    Temp 36.2 C (97.1 F) (Thermal Scan)    Resp 16    Ht 1.635 m (5' 4.37") Comment: *   Wt 60.8 kg (134 lb)    SpO2 100% Comment: ra   BMI 22.74 kg/m       ECOG Status: 2 - Ambulatory and capable of all selfcare, but unable to carry out any work  activities.  Up and about more than 50% of waking hours.    Physical Exam  Constitutional:       General: She is not in acute distress.     Appearance: She is normal weight.   HENT:      Nose: No congestion.      Mouth/Throat:      Mouth: Mucous membranes are dry.   Eyes:      Extraocular Movements: Extraocular movements intact.      Conjunctiva/sclera: Conjunctivae normal.   Cardiovascular:      Rate and Rhythm: Regular rhythm.      Heart sounds: No murmur heard.     Pulmonary:      Effort: No respiratory distress.      Breath sounds: No wheezing or rales.   Abdominal:      Palpations: Abdomen is soft. There is no mass.      Tenderness: There is no abdominal tenderness.   Musculoskeletal:         General: No swelling. Normal range of motion.      Cervical back: Neck supple. No rigidity.   Skin:     Capillary Refill: Capillary refill takes less than 2 seconds.      Findings: Bruising present.   Neurological:      Mental Status: Mental status is at baseline.      Motor: No weakness.      Gait: Gait abnormal.      Comments: Confused and aloof appearing  Participates little in the conversation  consensual vision filed testing reveals lack of peripheral vision in the right upper and the right lower quadrant   Psychiatric:         Mood and Affect: Mood normal.            Past Medical History:   Diagnosis Date    Acute metabolic encephalopathy 60/63/0160    Brain mass 06/29/2020    MRI: Multiple enhancing masses in the left parietal, temporal, and occipital lobes which are connected by masslike T2 FLAIR signal abnormality.  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  Largest mass extends along the ependymal lining of left lateral ventricle.    Breast mass, right     benign    Cancer (CMS HCC)     Cataract     bilat    Diabetes mellitus, type 2 (CMS HCC)     Embolism (CMS HCC)     Family history of colon cancer     Heartburn     HTN (hypertension)     Memory impairment 06/30/2020    Nausea with  vomiting     sister with PONV    Obesity     Pulmonary emboli     only once in 1999 after her hysterectomy    Thyroid nodule     left     Wears glasses     reading    Weight loss, unintentional 07/22/2020         Current Outpatient Medications   Medication Sig  amLODIPine (NORVASC) 10 mg Oral Tablet TAKE 1 TABLET BY MOUTH EVERY DAY    apixaban (ELIQUIS) 5 mg Oral Tablet Take 2 Tablets (10 mg total) by mouth Twice daily for 11 doses    [START ON 08/08/2020] apixaban (ELIQUIS) 5 mg Oral Tablet Take 1 Tablet (5 mg total) by mouth Twice daily for 30 days    aspirin (ECOTRIN) 81 mg Oral Tablet, Delayed Release (E.C.) Take 1 Tablet (81 mg total) by mouth Once a day    calcium citrate-vitamin D3 (CITRACAL) 200 mg-6.25 mcg (250 unit) Oral Tablet Take 1 Tablet by mouth Once a day (Patient not taking: Reported on 08/05/2020 )    dexAMETHasone (DECADRON) 2 mg Oral Tablet Take 2 tabs every 8 hours for 5 days; then 1 tab every 8 hours for 5 days; then 1 tab every 12 hours for 5 days; then 1 tab every day for 5 days and stop. (Patient taking differently: Take 2 tabs every 8 hours for 5 days; then 1 tab every 8 hours for 5 days; then 1 tab every 12 hours for 5 days; then 1 tab every day for 5 days and stop.  Indications: nausea and vomiting caused by cancer drugs)    dexAMETHasone (DECADRON) 2 mg Oral Tablet Take 2 mg by mouth Twice daily. Indications: nausea and vomiting caused by cancer drugs    Flaxseed Oil 1,000 mg Oral Capsule Take 1 Capsule by mouth Once a day Indications: .     fluticasone propionate (FLONASE) 50 mcg/actuation Nasal Spray, Suspension 1 Spray by Each Nostril route Twice per day as needed    levETIRAcetam (KEPPRA) 1,000 mg Oral Tablet Take 1 Tablet (1,000 mg total) by mouth Twice daily for 30 days    metFORMIN (GLUCOPHAGE XR) 500 mg Oral Tablet Sustained Release 24 hr Take 1 Tablet (500 mg total) by mouth Once a day    multivitamin Oral Tablet Take 1 Tab by mouth Once a day    ondansetron  (ZOFRAN ODT) 8 mg Oral Tablet, Rapid Dissolve 1 Tablet (8 mg total) by Sublingual route Every 8 hours as needed for Nausea/Vomiting for up to 90 days    temozolomide (TEMODAR) 100 mg Oral Capsule Take 1 Capsule (100 mg total) by mouth Once a day for 42 days with 2 other temozolomide prescriptions for 130 mg total    temozolomide (TEMODAR) 20 mg Oral Capsule Take 1 Capsule (20 mg total) by mouth Once a day for 42 days with 2 other temozolomide prescriptions for 130 mg total    temozolomide (TEMODAR) 5 mg Oral Capsule Take 2 Capsules (10 mg total) by mouth Once a day for 42 days with 2 other temozolomide prescriptions for 130 mg total (Patient taking differently: Take 10 mg by mouth Once a day not to start until radiation treatments begin and to take 1 hour before radiation treatment  Indications: metastatic malignant melanoma )    timolol maleate (TIMOPTIC) 0.25 % Ophthalmic Drops Instill 1 Drop into both eyes Twice daily      traMADoL (ULTRAM) 50 mg Oral Tablet Take 1 Tablet (50 mg total) by mouth Every 6 hours as needed (Patient taking differently: Take 1 Tablet by mouth Every 6 hours as needed for Pain. Indications: pain)    trimethoprim-sulfamethoxazole (BACTRIM DS) 160-850m per tablet Take 1 Tablet (160 mg total) by mouth Every Monday, Wednesday and Friday for 18 doses (Patient not taking: Reported on 08/05/2020 )     Allergies as of 08/05/2020 - Reviewed 08/05/2020   Allergen  Reaction Noted    Amoxicillin Rash 02/26/2013       Social History  Occupation:   Social History     Occupational History     Employer: [employer]    [place] [id number]   reports that she has never smoked. She has never used smokeless tobacco. She reports previously being sexually active. She reports that she does not drink alcohol and does not use drugs.    Labs:  Labs reviewed and interpreted:  Results for JENNEL, MARA (MRN [id number]) as of 08/05/2020 08:43   Ref. Range 08/01/2020 02:37   WBC Latest Ref  Range: 4.0 - 11.0 x103/uL 17.1 (H)   HGB Latest Ref Range: 12.0 - 15.5 g/dL 10.9 (L)   HCT Latest Ref Range: 36.0 - 45.0 % 31.7 (L)   PLATELET COUNT Latest Ref Range: 150 - 450 x103/uL 138 (L)   RBC Latest Ref Range: 4.00 - 5.10 x106/uL 3.73 (L)   MCV Latest Ref Range: 82.0 - 97.0 fL 85.0   MCHC Latest Ref Range: 32.0 - 36.0 g/dL 34.2   MCH Latest Ref Range: 27.5 - 33.2 pg 29.1   RDW Latest Ref Range: 11.0 - 16.0 % 15.3   MPV Latest Ref Range: 7.4 - 10.5 fL 8.9   PMN'S Latest Ref Range: 43 - 76 % 91 (H)   LYMPHOCYTES Latest Ref Range: 15 - 43 % 4 (L)   EOSINOPHIL Latest Ref Range: 0 - 5 % 0   MONOCYTES Latest Ref Range: 5 - 12 % 5   BASOPHILS Latest Ref Range: 0 - 3 % 0   PMN ABS Latest Ref Range: 1.50 - 6.50 x103/uL 15.60 (H)   LYMPHS ABS Latest Ref Range: 1.00 - 4.80 x103/uL 0.70 (L)   EOS ABS Latest Ref Range: 0.00 - 0.50 x103/uL 0.00   MONOS ABS Latest Ref Range: 0.20 - 0.90 x103/uL 0.80   BASOS ABS Latest Ref Range: 0.00 - 0.10 x103/uL 0.00   aPTT Latest Ref Range: 25.1 - 36.5 seconds 62.2 (H)   SODIUM Latest Ref Range: 136 - 145 mmol/L 132 (L)   POTASSIUM Latest Ref Range: 3.5 - 5.1 mmol/L 4.4   CHLORIDE Latest Ref Range: 96 - 111 mmol/L 100   CARBON DIOXIDE Latest Ref Range: 23 - 31 mmol/L 26   BUN Latest Ref Range: 8 - 25 mg/dL 13   CREATININE Latest Ref Range: 0.60 - 1.05 mg/dL 0.61   GLUCOSE Latest Ref Range: 65 - 125 mg/dL 150 (H)   ANION GAP Latest Ref Range: 4 - 13 mmol/L 6   BUN/CREAT RATIO Latest Ref Range: 6 - 22  21   ESTIMATED GLOMERULAR FILTRATION RATE Latest Ref Range: >=60 mL/min/BSA >90   CALCIUM Latest Ref Range: 8.8 - 10.2 mg/dL 9.1   MAGNESIUM Latest Ref Range: 1.8 - 2.6 mg/dL 2.2     Radiology: All recent pertinent radiologic images and reports reviewed    CTA 07/29/2020-  Lungs: There is some consolidation in the right lower lobe suspicious for an infarction.  Pleural spaces: There is a small right pleural effusion.  Heart: There is cardiomegaly. Mildly prominent right atrium and  ventricle could reflect heart strain.  Pulmonary arteries: In both upper, right middle and both lower lobes, segmental/subsegmental pulmonary artery filling defects are identified.      Assessment/Plan:     1. GBM (glioblastoma multiforme) (CMS [place]):  2. Encounter for chemotherapy management:    MGMT- Unmethylated  1p loss, 19q intact  Unresectable  Discussed the prior treatment paradigms of unresectable GBM with the patient and the brother today. The patient unfortunately does not understand much-and hence most of the conversation was held with her brother.  Recommend Concurrent chemo-RT using Temodar as a radiosensitizer. RT to start 08/06/20.  The need for weekly CBC while on concurrent chemo RT was also discussed, standing labs ordered.   The brother does understand that this proposed 6 weeks of concurrent chemo- RT is only the Phase I of treatment.  He understands that a restaging brain MRI will be done a month after completion of concurrent chemo RT.  The poor prognosis associated with GBM was also discussed.  Clinical trial options was brought about-they are currently very overwhelmed and I shall continue to rediscuss this at our subsequent visits. She will continue tapering steroids per neurosurgery instructions.    3. Seizure Disorder:    Continue with Keppra 1 g BID PO.  Will ensure outpatient neurology follow-Up.    4. Thyroid nodule    -TIRADS 4 nodule in the left lobe of the thyroid gland.   -[place] dated 09/20-shows a complex nodule in the left thyroid lobe measuring 15 x 12 x 13 mm.  The this is a mixed cystic and solid nodule which is hyper to isoechoic and taller than wider with smooth margins and calcifications-overall TI-RADS 4.  -IR US guided thyroid Biopsy was planned but more recent PE and the need for uninterrupted anticoagulation-will postpone thyroid biopsy for three months.     5. Pulmonary Embolus:   Diagnosed 07/29/20.   Continue daily Eliquis.    RTC 2 WEEKS.  WEEKLY LABS.       CC:  PCP  General:  Kayleen Memos, MD  [address]  [place] [address]

## 2020-08-05 NOTE — Case Communication (Signed)
Per communication with P.T."with contact guard and 1 step directions, she will be safe for this transfer."

## 2020-08-06 ENCOUNTER — Other Ambulatory Visit: Payer: Medicare PPO

## 2020-08-06 ENCOUNTER — Other Ambulatory Visit: Payer: Self-pay

## 2020-08-06 LAB — QUEST MISC ORDER - AMBIENT

## 2020-08-06 MED ORDER — BENEFIBER CLEAR SF (DEXTRIN) ORAL
2.0000 [tsp_us] | Freq: Two times a day (BID) | ORAL | Status: DC
Start: 2020-08-06 — End: 2020-09-25

## 2020-08-06 MED ORDER — POLYETHYLENE GLYCOL 3350 17 GRAM ORAL POWDER PACKET
17.0000 g | Freq: Two times a day (BID) | ORAL | Status: DC | PRN
Start: 2020-08-06 — End: 2020-08-11

## 2020-08-06 MED ORDER — DOCUSATE SODIUM 100 MG CAPSULE
100.0000 mg | ORAL_CAPSULE | Freq: Two times a day (BID) | ORAL | Status: DC
Start: 2020-08-06 — End: 2020-09-25

## 2020-08-06 NOTE — Home Health (Signed)
Patient has a bruise on the back of her right leg above the knee.Rondel Jumbo RN was there at the time of my visit.

## 2020-08-06 NOTE — Home Health (Addendum)
SN greeted by brother at door when arriving for visit.Pt. with HHA when first arrived. Then patient sat on couch for visit.VS and assessment completed by SN. Pt. denies chest pain or discomfort in chest, denies pain with deep inspirations,breath sounds diminished thruout with wheezing poster right upper lobes.Pt. denies c/o pain.Large bruise noted on back of right thigh, pt. unble to say if she hit leg , educated brother on Eliquis and that it was easy for her to bruise and bleed and to maintain bleeding precautions.Bruises remain on left arm from old IV sites and blood draws.Next SN visit will be 08/10/20.

## 2020-08-07 ENCOUNTER — Other Ambulatory Visit: Payer: Self-pay

## 2020-08-07 ENCOUNTER — Other Ambulatory Visit: Payer: Medicare PPO

## 2020-08-07 NOTE — Case Communication (Signed)
Patient taking Lorazepam 0.5mg  1 tablet 30 min for each radiation appt as needed for anxiety/claustrophobia   08/06/20 prescribed by Brantley Persons ,MD

## 2020-08-07 NOTE — Home Health (Signed)
Upon arrival patient seated at couch with blanket and robe.  Brother present stating patient has been fatigued , but encouraged her to exercise today and patient agreeable.  Vitals obtained and WNL with no report of pain this date.  Patient tolerated standing ex at 10 reps ea before fatigued, needing seated rest .  Patient reluctant to state she is fatigued so consistent watch of vitals and patient's demeanor required.  Gt training with 2ww throughout home this date due to rain, with patient tolerating well in terms of balance, but due to R vision issues and coordination deficits, freq verbal cues required for direction and avoiding obstacles.  Plan for next visit to continue gt training , balance and strengthening per PT POC.

## 2020-08-08 ENCOUNTER — Other Ambulatory Visit: Payer: Self-pay

## 2020-08-09 ENCOUNTER — Other Ambulatory Visit: Payer: Self-pay

## 2020-08-09 MED ORDER — LORAZEPAM 0.5 MG TABLET
0.5000 mg | ORAL_TABLET | Freq: Every day | ORAL | Status: DC
Start: 2020-08-06 — End: 2020-09-25

## 2020-08-10 ENCOUNTER — Other Ambulatory Visit: Payer: Medicare PPO

## 2020-08-10 ENCOUNTER — Other Ambulatory Visit: Payer: Self-pay

## 2020-08-10 NOTE — Home Health (Signed)
SN greeted by brother at door when arriving for visit.Pt. sat on couch for visit.VS and assessment completed.Pt. denies pain or discomfort. Bruises remain on LAC and right thigh, no pain voicd at sites.Medications reviewed and updated. Pt. remains pleasantly confused and alert to person only.Pt. ambulating with walker , gait slow and steady.Family stays with pt. around the clock. Pt. goes to radiation daily at 2:15 pm.Next SN visit will be 08/13/20.

## 2020-08-11 ENCOUNTER — Other Ambulatory Visit: Payer: Self-pay

## 2020-08-11 ENCOUNTER — Telehealth (HOSPITAL_COMMUNITY): Payer: Self-pay

## 2020-08-11 ENCOUNTER — Encounter (INDEPENDENT_AMBULATORY_CARE_PROVIDER_SITE_OTHER): Payer: Self-pay | Admitting: Geriatric Medicine

## 2020-08-11 ENCOUNTER — Ambulatory Visit (INDEPENDENT_AMBULATORY_CARE_PROVIDER_SITE_OTHER): Payer: Medicare PPO | Admitting: Geriatric Medicine

## 2020-08-11 ENCOUNTER — Other Ambulatory Visit: Payer: Medicare PPO

## 2020-08-11 VITALS — BP 108/76 | HR 75 | Temp 97.0°F | Resp 14 | Ht 64.0 in | Wt 132.0 lb

## 2020-08-11 DIAGNOSIS — C719 Malignant neoplasm of brain, unspecified: Secondary | ICD-10-CM

## 2020-08-11 DIAGNOSIS — I2699 Other pulmonary embolism without acute cor pulmonale: Secondary | ICD-10-CM

## 2020-08-11 DIAGNOSIS — R58 Hemorrhage, not elsewhere classified: Secondary | ICD-10-CM

## 2020-08-11 DIAGNOSIS — Z6822 Body mass index (BMI) 22.0-22.9, adult: Secondary | ICD-10-CM

## 2020-08-11 DIAGNOSIS — E119 Type 2 diabetes mellitus without complications: Secondary | ICD-10-CM

## 2020-08-11 NOTE — Home Health (Signed)
Upon arrival greeted by patient's sister. Sister making appt for 11am today due to patient with bruising over B arms at elbow area as well as back of legs.  Patient is new to Eliquis since her blood clotting issues recently and is going to have this checked out.  Patient's vitals assessed and WNL, pt stating she feels good today and agreeable to tx.  Educ on blood pressure changes from sitting to standing and to take pause when standing, as patient has had some dizzy episodes.  Also caregiver noting confusion of processes with hall bathroom that is used during the day.  patient demonstrated ability to perform entry and toilet transfer 2x with  need for one step commands.  Patient unable to process 2 step commands without difficulty recalling second step.  Patient performed standing strengthening ex at 15 reps ea with good form throughout exercises and good recall of each after provided cue to start.  Patient fatigued mildly with activities today and encouraged to pace self as she has many appts as part of her regular routine and will need to pace and rest between outings as able.  Plan for next visit to continue gt training, strengthening, balance per PT POC.

## 2020-08-11 NOTE — Telephone Encounter (Addendum)
Pt's sister, Latanya Presser, called. Pt gets disoriented and/or dizzy sometimes before and sometimes after taking Temodar. It mostly happens mid-day. MaryAnne advised that dizzyness is one of the side effects and to have some fall precautions in place.     Notified Dr. Oletta Darter about side effects and further instructions.    Servando Snare  Triage Nurse    No further instructions at this time per Dr. Oletta Darter.

## 2020-08-11 NOTE — Progress Notes (Signed)
Subjective:     Patient ID:  Carrie Escobar is an 74 y.o. female     Chief Complaint:    Chief Complaint   Patient presents with    Hematoma    Hospital Follow Up       The history is provided by the patient, a relative and medical records. No language interpreter was used.     Carrie Escobar is a 74 y.o female here with concern of multiple hematoma. Patient history is limited but she presents with her sister. She has recent hospital d/c for multiple pulmonary emboli, glioblastoma causing memory impairment. She was started on Eliquis during most recent admission 07/29/2020 thru 08/02/2020.     Her sister reports it is unknown when hematoma presented. She notes present at least 1 week. Area affected bilateral upper extremity and right back of leg. Her sister reports her right arm has progressively worsened since onset. She reports this area with swelling and intermittent pain but otherwise denies pain at any site.     She denies any abnormal bleeding otherwise.     Her family mentions worsening confusion, memory decline.     Glucose is not beng monitored. No glucometer. Taking metformin. Last A1c 06/2020 6.4    Her blood pressure at home fluctuates 108-120 over 70-80's.      Hospital note and tests were reviewed.   Past Medical History  Current Outpatient Medications   Medication Sig    amLODIPine (NORVASC) 10 mg Oral Tablet TAKE 1 TABLET BY MOUTH EVERY DAY    apixaban (ELIQUIS) 5 mg Oral Tablet Take 1 Tablet (5 mg total) by mouth Twice daily for 30 days    aspirin (ECOTRIN) 81 mg Oral Tablet, Delayed Release (E.C.) Take 1 Tablet (81 mg total) by mouth Once a day    calcium citrate-vitamin D3 (CITRACAL) 200 mg-6.25 mcg (250 unit) Oral Tablet Take 1 Tablet by mouth Once a day (Patient not taking: Reported on 08/05/2020 )    dexAMETHasone (DECADRON) 2 mg Oral Tablet Take 2 mg by mouth Twice daily. Indications: nausea and vomiting caused by cancer drugs    docusate sodium (COLACE) 100 mg Oral Capsule Take  100 mg by mouth Twice daily. Indications: constipation    Flaxseed Oil 1,000 mg Oral Capsule Take 1 Capsule by mouth Once a day Indications: .     fluticasone propionate (FLONASE) 50 mcg/actuation Nasal Spray, Suspension 1 Spray by Each Nostril route Twice per day as needed    levETIRAcetam (KEPPRA) 1,000 mg Oral Tablet Take 1 Tablet (1,000 mg total) by mouth Twice daily for 30 days    LORazepam (ATIVAN) 0.5 mg Oral Tablet Take 0.5 mg by mouth Once a day. Indications: anxious    metFORMIN (GLUCOPHAGE XR) 500 mg Oral Tablet Sustained Release 24 hr Take 1 Tablet (500 mg total) by mouth Once a day    multivitamin Oral Tablet Take 1 Tab by mouth Once a day    ondansetron (ZOFRAN ODT) 8 mg Oral Tablet, Rapid Dissolve 1 Tablet (8 mg total) by Sublingual route Every 8 hours as needed for Nausea/Vomiting for up to 90 days    temozolomide (TEMODAR) 100 mg Oral Capsule Take 1 Capsule (100 mg total) by mouth Once a day for 42 days with 2 other temozolomide prescriptions for 130 mg total    temozolomide (TEMODAR) 20 mg Oral Capsule Take 1 Capsule (20 mg total) by mouth Once a day for 42 days with 2 other temozolomide prescriptions for 130 mg total  temozolomide (TEMODAR) 5 mg Oral Capsule Take 2 Capsules (10 mg total) by mouth Once a day for 42 days with 2 other temozolomide prescriptions for 130 mg total    timolol maleate (TIMOPTIC) 0.25 % Ophthalmic Drops Instill 1 Drop into both eyes Twice daily      traMADoL (ULTRAM) 50 mg Oral Tablet Take 1 Tablet (50 mg total) by mouth Every 6 hours as needed (Patient taking differently: Take 1 Tablet by mouth Every 6 hours as needed for Pain. Indications: pain)    trimethoprim-sulfamethoxazole (BACTRIM DS) 160-800mg  per tablet Take 1 Tablet (160 mg total) by mouth Every Monday, Wednesday and Friday for 18 doses (Patient taking differently: Take 1 Tablet by mouth Every Monday, Wednesday and Friday. Indications: urinary tract infection prevention)    wheat dextrin  (BENEFIBER CLEAR SF, DEXTRIN, ORAL) Take 2 teaspoons by mouth 2X/day. Indications: constipation     Allergies   Allergen Reactions    Amoxicillin Rash     Past Medical History:   Diagnosis Date    Acute metabolic encephalopathy 83/15/1761    Brain mass 06/29/2020    MRI: Multiple enhancing masses in the left parietal, temporal, and occipital lobes which are connected by masslike T2 FLAIR signal abnormality.  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  Largest mass extends along the ependymal lining of left lateral ventricle.    Breast mass, right     benign    Cancer (CMS HCC)     Cataract     bilat    Diabetes mellitus, type 2 (CMS HCC)     Embolism (CMS HCC)     Family history of colon cancer     Heartburn     HTN (hypertension)     Memory impairment 06/30/2020    Nausea with vomiting     sister with PONV    Obesity     Pulmonary emboli     only once in 1999 after her hysterectomy    Thyroid nodule     left     Wears glasses     reading    Weight loss, unintentional 07/22/2020         Past Surgical History:   Procedure Laterality Date    20550 - INJ SINGLE TENDON SHEATH/LIGAMENT, APONEUROSIS W/ [place] INTERP ONLY (AMB ONLY)  02/10/2016    COLONOSCOPY  2016    HX BREAST BIOPSY Right     BENIGN     HX CATARACT REMOVAL  10/2013    left    HX COLONOSCOPY      HX HYSTERECTOMY  1999    fibroid    HX ROTATOR CUFF REPAIR  11/04/2013    right shoulder         Family Medical History:     Problem Relation (Age of Onset)    Breast Cancer Maternal Grandmother    Cancer Mother, Sister    Diabetes Sister    Heart Attack Sister    Hypertension (High Blood Pressure) Mother, Sister    No Known Problems Brother, Maternal Grandfather, Paternal Grandmother, Paternal 14, Daughter, Son, Maternal Aunt, Maternal Uncle, Paternal Aunt, Paternal Uncle, Other    Stroke Mother, Father            Social History     Socioeconomic History    Marital status: Divorced     Spouse name: Not on file    Number of  children: 0    Years of education: Not on file    Highest education level:  Not on file   Occupational History     Employer: [employer]   Tobacco Use    Smoking status: Never Smoker    Smokeless tobacco: Never Used   Vaping Use    Vaping Use: Never used   Substance and Sexual Activity    Alcohol use: No     Alcohol/week: 0.0 standard drinks    Drug use: No    Sexual activity: Not Currently   Other Topics Concern    Abuse/Domestic Violence No    Drives Yes    Special Diet No    Uses Cane No    Uses walker No    Uses wheelchair No    Right hand dominant Yes    Left hand dominant No    Ability to Walk 1 Flight of Steps without SOB/CP Yes    Routine Exercise Yes     Comment: Zumba class    Ability to Walk 2 Flight of Steps without SOB/CP Yes    Ability To Do Own ADL's Yes     Social Determinants of Health     Financial Resource Strain:     Difficulty of Paying Living Expenses:    Food Insecurity:     Worried About Charity fundraiser in the Last Year:     Arboriculturist in the Last Year:    Transportation Needs:     Film/video editor (Medical):     Lack of Transportation (Non-Medical):    Physical Activity:     Days of Exercise per Week:     Minutes of Exercise per Session:    Stress:     Feeling of Stress :    Intimate Partner Violence:     Fear of Current or Ex-Partner:     Emotionally Abused:     Physically Abused:     Sexually Abused:          Review of Systems   Constitutional: Positive for fatigue. Negative for chills, diaphoresis and fever.   HENT: Negative.    Respiratory: Negative.    Cardiovascular: Negative.    Gastrointestinal: Negative.    Genitourinary: Negative.    Musculoskeletal: Positive for arthralgias, gait problem and myalgias.   Skin: Positive for rash.   Neurological: Positive for weakness. Negative for tremors, seizures, syncope, facial asymmetry and speech difficulty.   Hematological: Negative.    Psychiatric/Behavioral: Positive for confusion (on and off) and  decreased concentration. Negative for agitation, behavioral problems, dysphoric mood, hallucinations, self-injury, sleep disturbance and suicidal ideas. The patient is not nervous/anxious and is not hyperactive.        Objective:     BP 108/76    Pulse 75    Temp 36.1 C (97 F)    Resp 14    Ht 1.626 m (5\' 4" )    Wt 59.9 kg (132 lb)    SpO2 96%    Breastfeeding No    BMI 22.66 kg/m          Physical Exam  Vitals and nursing note reviewed.   Constitutional:       General: She is not in acute distress.     Appearance: Normal appearance. She is not ill-appearing, toxic-appearing or diaphoretic.   HENT:      Head: Normocephalic.      Right Ear: External ear normal.   Eyes:      General: No scleral icterus.     Conjunctiva/sclera: Conjunctivae normal.   Pulmonary:  Effort: Pulmonary effort is normal.   Musculoskeletal:      Cervical back: Normal range of motion and neck supple.   Skin:     Coloration: Skin is not jaundiced.      Findings: Bruising and ecchymosis present.      Nails: There is no clubbing.          Neurological:      Mental Status: She is alert. Mental status is at baseline.      Cranial Nerves: No cranial nerve deficit.      Sensory: No sensory deficit.      Motor: No weakness.      Coordination: Coordination normal.      Gait: Gait abnormal.   Psychiatric:         Behavior: Behavior normal.         Thought Content: Thought content normal.         Ortho Exam    Assessment & Plan:       ICD-10-CM    1. Multiple pulmonary emboli (CMS HCC)  I26.99    2. Brain neoplasm malignant (CMS HCC)  C71.9    3. Ecchymosis  R58 2nd to recent hospitalization/ IV access, being on Eliquis  Will monitor    4. Type 2 diabetes mellitus (CMS HCC)  E11.9 DME - GLUCOMETER     DME - DIABETIC SUPPLIES   5. BMI 22.0-22.9, adult  Z68.22      The patient was educated about the need to be compliant with treatment to achieve a better therapeutic outcome. The patient was educated about the risk and benefit of the medication  including potential side effects. The patient is aware and verbalized understanding about them    The Pt's current and discharge meds were reviewed and reconciled.     BMI addressed: healthy diet

## 2020-08-12 ENCOUNTER — Other Ambulatory Visit: Payer: Self-pay

## 2020-08-12 ENCOUNTER — Other Ambulatory Visit: Payer: Medicare PPO

## 2020-08-12 ENCOUNTER — Ambulatory Visit: Payer: Medicare PPO | Attending: Internal Medicine

## 2020-08-12 DIAGNOSIS — Z5111 Encounter for antineoplastic chemotherapy: Secondary | ICD-10-CM | POA: Insufficient documentation

## 2020-08-12 DIAGNOSIS — C719 Malignant neoplasm of brain, unspecified: Secondary | ICD-10-CM | POA: Insufficient documentation

## 2020-08-12 LAB — MANUAL DIFFERENTIAL
BAND %: 5 % — ABNORMAL HIGH (ref 0–4)
BASOPHIL %: 1 % (ref 0–3)
BASOPHIL ABSOLUTE: 0.15 10*3/uL — ABNORMAL HIGH (ref 0.00–0.10)
EOSINOPHIL %: 1 % (ref 0–5)
EOSINOPHIL ABSOLUTE: 0.15 10*3/uL (ref 0.00–0.50)
LYMPHOCYTE %: 7 % — ABNORMAL LOW (ref 15–43)
LYMPHOCYTE ABSOLUTE: 1.08 10*3/uL (ref 1.00–4.80)
MONOCYTE %: 2 % — ABNORMAL LOW (ref 5–12)
MONOCYTE ABSOLUTE: 0.31 10*3/uL (ref 0.20–0.90)
NEUTROPHIL %: 84 % — ABNORMAL HIGH (ref 43–76)
NEUTROPHIL ABSOLUTE: 13.71 10*3/uL — ABNORMAL HIGH (ref 1.50–6.50)
NUCLEATED RBC MANUAL: 1 — ABNORMAL HIGH
PLATELET ESTIMATE: ADEQUATE
RBC MORPHOLOGY COMMENT: NORMAL
WBC MORPHOLOGY COMMENT: NORMAL
WBC: 15.4 10*3/uL

## 2020-08-12 LAB — CBC WITH DIFF
HCT: 30.9 % — ABNORMAL LOW (ref 36.0–45.0)
HGB: 10.6 g/dL — ABNORMAL LOW (ref 12.0–15.5)
MCH: 29.6 pg (ref 27.5–33.2)
MCHC: 34.3 g/dL (ref 32.0–36.0)
MCV: 86.2 fL (ref 82.0–97.0)
MPV: 7.9 fL (ref 7.4–10.5)
PLATELETS: 358 10*3/uL (ref 150–450)
RBC: 3.58 10*6/uL — ABNORMAL LOW (ref 4.00–5.10)
RDW: 15.5 % (ref 11.0–16.0)
WBC: 15.4 10*3/uL — ABNORMAL HIGH (ref 4.0–11.0)

## 2020-08-13 ENCOUNTER — Other Ambulatory Visit: Payer: Medicare PPO

## 2020-08-13 ENCOUNTER — Encounter (HOSPITAL_BASED_OUTPATIENT_CLINIC_OR_DEPARTMENT_OTHER): Payer: Self-pay

## 2020-08-13 ENCOUNTER — Other Ambulatory Visit: Payer: Self-pay

## 2020-08-13 ENCOUNTER — Other Ambulatory Visit (INDEPENDENT_AMBULATORY_CARE_PROVIDER_SITE_OTHER): Payer: Self-pay | Admitting: Geriatric Medicine

## 2020-08-13 DIAGNOSIS — E119 Type 2 diabetes mellitus without complications: Secondary | ICD-10-CM

## 2020-08-13 MED ORDER — BLOOD SUGAR DIAGNOSTIC STRIPS
ORAL_STRIP | 3 refills | Status: AC
Start: 2020-08-13 — End: 2020-08-14

## 2020-08-13 MED ORDER — BLOOD-GLUCOSE METER
0 refills | Status: AC
Start: 2020-08-13 — End: 2020-08-14

## 2020-08-13 MED ORDER — POLYETHYLENE GLYCOL 3350 17 GRAM ORAL POWDER PACKET
17.0000 g | Freq: Two times a day (BID) | ORAL | Status: DC
Start: 2020-08-13 — End: 2020-09-25

## 2020-08-13 MED ORDER — BLOOD GLUCOSE CONTROL, NORMAL SOLUTION
11 refills | Status: AC
Start: 2020-08-13 — End: 2020-08-14

## 2020-08-13 MED ORDER — LANCETS 26 GAUGE
3 refills | Status: AC
Start: 2020-08-13 — End: 2020-08-14

## 2020-08-13 NOTE — Nursing Note (Signed)
Caris report received and scanned into patients chart.

## 2020-08-13 NOTE — Home Health (Signed)
SN greeted by brother when arriving for visit.Pt. sat on couch for visit. VS and assessment completed by SN.Pt. c/o pain at bruise site on left arm from old IV site,PCP saw pt. this week and evaluated this bruise and also bruise on right thight behind knee, has Tramadol she can take for pain, only painful when you touch it.Pt. is also on Eliquis twice a day per orders for P.E.Pt. has no pain with deep inspirations and breath sounds are clear.Pt. is more fatigued this week according to brother and she is receiving radiation daily, plus Temodar her Chemo med has a side effect of fatigue.Brother aware of this.Pt's appetite has decreased also but that is side effect of drug too.Encouraged brother to call us if any problems and he acknowledged this and understood.Pt. remains confused to time ,place and situation. Next SN visit will be next week.

## 2020-08-14 ENCOUNTER — Other Ambulatory Visit: Payer: Self-pay

## 2020-08-14 ENCOUNTER — Other Ambulatory Visit: Payer: Medicare PPO

## 2020-08-14 ENCOUNTER — Telehealth (HOSPITAL_COMMUNITY): Payer: Self-pay

## 2020-08-14 NOTE — Telephone Encounter (Signed)
Patients brother, Carrie Escobar, called asking if patient can take tylenol for a mild headache.  He also states when Carrie Escobar had the tele visit with Dr. Ralene Bathe she was instructed to take the Temodar everyday including weekends while receiving RT instead of only RT days. He wanted to confirm this is what he should be doing.  Patients first RT was 08/06/20 and has received Temodar daily since. Per Dr. Oletta Darter, patient can take tylenol for mild headache and patient is to take Temodar daily including weekends.    Carrie Escobar notes he was only sent 28 capsules of Temodar 100 mg and 28 capsules of Temodar 20 mg instead of the 42 capsules of each that was ordered.  He currently has 19 capsules of each strength remaining, which will last until 09/02/20.  Carrie Escobar will call [facility] to inquire about the remaining 14 capsule that was ordered. He understands to call [facility] by 08/24/20 to ensure they have proper amount of time to ship remaining medication. Carrie Escobar verbalized understanding of all the above.  He will call office if any questions or needs.

## 2020-08-14 NOTE — Home Health (Signed)
Upon arrival , patient just completing visit with HHA.  Patient voiced she is feeling okay today.  Brother present stating patient has been getting fatigued with her days that include chemo and many dr appt outings.  Brother asking for a lightened visit today.  Brother asked by clinician to see if today's visit being lighter allows for more energy to get through the day.  Patient demonstrated good tolerance to 5 reps ea standing ex  as well as ambulation with 2ww throughout home at sbA.  R sided obstacles continue to be of issue for patient due o vision deficits of R eye that appear to maybe be worsening.  Brother agrees R vision is worsening some stating he has to put her tray of food more Left of center for patient to be able to view and to neglect this area.  Educ on awareness to this factor when ambulating, slowing cadence to be able to assess obstacles , scan room for obstacles before approaching.  Pt and brother (PCG) voiced understanding. Plan for next visit to continue gt training, balance, strengthening per PT POC to patient tolerance as she battles this diagnosis and chemotherapy.

## 2020-08-15 ENCOUNTER — Other Ambulatory Visit: Payer: Self-pay

## 2020-08-16 ENCOUNTER — Other Ambulatory Visit: Payer: Self-pay

## 2020-08-17 ENCOUNTER — Other Ambulatory Visit: Payer: Self-pay

## 2020-08-17 ENCOUNTER — Other Ambulatory Visit: Payer: Medicare PPO

## 2020-08-17 ENCOUNTER — Encounter (INDEPENDENT_AMBULATORY_CARE_PROVIDER_SITE_OTHER): Payer: Self-pay | Admitting: INTERNAL MEDICINE - CARDIOVASCULAR DISEASE

## 2020-08-17 NOTE — Progress Notes (Addendum)
[address] [place] [address]  [phone]     Date: 08/21/2020  Patient Name: Carrie Escobar  MRN#: [id number]  DOB: 05-10-1946    Provider: Julian Hy, MD  PCP: Kayleen Memos, MD      Reason for visit: Follow-up After Testing and Hospital Follow Up (Pulmonary emboli)    History:     Carrie Escobar is a 74 y.o. with a history of HTN, DM II, and h/o embolism. She was last seen on 07/28/20 when an echo was ordered to reassess her LVEF.     ED visit 08/02/20 for CP and SOB, CT showed multiple emboli and was started on Heparin drip. She was discharged on Eliquis 10 mg BID.    Today she reports feeling well from a cardiac standpoint with no c/o any CP, SOB, palpitations, syncope/presyncope, orthopnea/PND, LE edema, or claudication. She started chemo and radiation on 08/06/20 and is following closing with Dr. Oletta Darter regarding her cancer medications.     Past Medical History:     Past Medical History:   Diagnosis Date    Acute metabolic encephalopathy 00/34/9179    Brain mass 06/29/2020    MRI: Multiple enhancing masses in the left parietal, temporal, and occipital lobes which are connected by masslike T2 FLAIR signal abnormality.  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  Largest mass extends along the ependymal lining of left lateral ventricle.    Breast mass, right     benign    Cancer (CMS [place])     Cataract     bilat    Diabetes mellitus, type 2 (CMS [place])     Embolism (CMS HCC)     Family history of colon cancer     Heartburn     HTN (hypertension)     Memory impairment 06/30/2020    Nausea with vomiting     sister with PONV    Obesity     Pulmonary emboli     only once in 1999 after her hysterectomy    Thyroid nodule     left     Wears glasses     reading    Weight loss, unintentional 07/22/2020         Past Surgical History:     Past Surgical History:   Procedure Laterality Date     20550 - inj single tendon sheath/ligament, aponeurosis w/ [place] interp only (amb only)  02/10/2016    Colonoscopy  2016    Hx breast biopsy Right     Hx cataract removal  10/2013    Hx colonoscopy      Hx hysterectomy  1999    Hx rotator cuff repair  11/04/2013     Allergies:     Allergies   Allergen Reactions    Amoxicillin Rash       Medications:     Outpatient Medications Marked as Taking for the 08/21/20 encounter (Office Visit) with Julian Hy, MD   Medication Sig    amLODIPine (NORVASC) 10 mg Oral Tablet TAKE 1 TABLET BY MOUTH EVERY DAY    apixaban (ELIQUIS) 5 mg Oral Tablet Take 1 Tablet (5 mg total) by mouth Twice daily for 30 days    aspirin (ECOTRIN) 81 mg Oral Tablet, Delayed Release (E.C.) Take 1 Tablet (81 mg total) by mouth Once a day    dexAMETHasone (DECADRON) 2 mg Oral Tablet Take 2 mg by mouth Twice daily. Indications: nausea  and vomiting caused by cancer drugs    Flaxseed Oil 1,000 mg Oral Capsule Take 1 Capsule by mouth Twice daily  Indications: herbal supplement     levETIRAcetam (KEPPRA) 1,000 mg Oral Tablet Take 1 Tablet (1,000 mg total) by mouth Twice daily for 30 days    metFORMIN (GLUCOPHAGE XR) 500 mg Oral Tablet Sustained Release 24 hr Take 1 Tablet (500 mg total) by mouth Once a day    multivitamin Oral Tablet Take 1 Tab by mouth Once a day    temozolomide (TEMODAR) 100 mg Oral Capsule Take 1 Capsule (100 mg total) by mouth Once a day for 14 days with 2 other temozolomide prescriptions for 130 mg total    temozolomide (TEMODAR) 20 mg Oral Capsule Take 1 Capsule (20 mg total) by mouth Once a day for 14 days with 2 other temozolomide prescriptions for 130 mg total (Patient taking differently: Take 20 mg by mouth Once a day 120 mg total )    timolol maleate (TIMOPTIC) 0.25 % Ophthalmic Drops Instill 1 Drop into both eyes Twice daily      wheat dextrin (BENEFIBER CLEAR SF, DEXTRIN, ORAL) Take 2 teaspoons by mouth 2X/day. Indications: constipation     Family History:      Family Medical History:     Problem Relation (Age of Onset)    Breast Cancer Maternal Grandmother    Cancer Mother, Sister    Diabetes Sister    Heart Attack Sister    Hypertension (High Blood Pressure) Mother, Sister    No Known Problems Brother, Maternal Grandfather, Paternal Grandmother, Paternal Grandfather, Daughter, Son, Maternal 34, Maternal Uncle, Paternal 11, Paternal Uncle, Other    Stroke Mother, Father            Social History:     Social History     Socioeconomic History    Marital status: Divorced     Spouse name: Not on file    Number of children: 0    Years of education: Not on file    Highest education level: Not on file   Occupational History     Employer: [employer]   Tobacco Use    Smoking status: Never Smoker    Smokeless tobacco: Never Used   Brewing technologist Use: Never used   Substance and Sexual Activity    Alcohol use: No     Alcohol/week: 0.0 standard drinks    Drug use: No    Sexual activity: Not Currently   Other Topics Concern    Abuse/Domestic Violence No    Drives Yes    Special Diet No    Uses Cane No    Uses walker No    Uses wheelchair No    Right hand dominant Yes    Left hand dominant No    Ability to Walk 1 Flight of Steps without SOB/CP Yes    Routine Exercise Yes     Comment: Zumba class    Ability to Walk 2 Flight of Steps without SOB/CP Yes    Ability To Do Own ADL's Yes       Review of Systems:     All systems were reviewed and are negative other than noted in the HPI.    Physical Exam:     Vitals:    08/21/20 1014   BP: 109/70   Pulse: 83   SpO2: 97%   Weight: 61.1 kg (134 lb 12.8 oz)  Cardiovascular:  RRR  No murmur  No rubs, clicks, or gallops    Pulse exam:  2+ radial pulses  2+ PT pulses    Extremities: Warm and well-perfused, no edema    Neck: Supple, no JVD, no bruits  General: Awake, alert, and in no acute distress  HEENT: Moist mucous membranes  Respiratory: Clear to auscultation  Abdominal/Gastrointestinal: Soft, non-tender, no  masses, no organomegaly  Skin: Non jaundiced, no rashes  Neurological: Grossly nonfocal  Psychiatric: Normal affect    Cardiovascular Workup:     Echo 10/04/17   1. Normal left ventricular size and function. Moderate eccentric posterolateral mitral insufficiency present  (this could be underestimated on a surface echocardiogram).  2. Normal pulmonary artery pressure.    Brain MRI 06/27/20   Multiple enhancing masses in the left parietal, temporal and occipital  lobes which are connected by masslike T2/FLAIR signal abnormality which  also extends into the splenium of the corpus callosum. 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 The largest mass extends along the  ependymal lining of the left lateral ventricle.  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    Event monitor 07/08/20-08/07/20   NSR  Sinus brady  PVC  Bigeminy   Atrial tachycardia   (no sxs)    Echo 07/29/20  The left ventricular ejection fraction by visual assessment is estimated to be 55-60%.  The study images were of technically good quality.  Transthoracic complete echo, 2D, spectral and tissue Doppler, color flow Doppler, M-mode.  Normal left ventricular size.  No segmental/regional wall motion abnormalities identified.  Left ventricular diastolic parameters are normal.  No evidence of mitral stenosis.  There is mild mitral regurgitation.  There is no Aortic valve stenosis.  There is no evidence of aortic regurgitation.  The interatrial septum is normal in appearance.  Normal IVC size with >50% inspiratory collapse (estimated RA pressure 3 mmHg).  The aortic root is of normal size.  Normal pericardium with no pericardial effusion.     CTA chest 07/29/20  1. Positive study for pulmonary embolism.  2. There is some consolidation in the right lower lobe suspicious for an infarction.  3. Mildly prominent right atrium and ventricle could reflect heart strain.    Echo 07/31/20   The left ventricular ejection fraction  by visual assessment is estimated to be 60-65%.    Assessment:     Timea Breed is a 74 y.o. woman with    EKG changes: Delta waves seen on EKG   - on Keppra     Valvular heart disease: mild MR and moderate TR on echo 07/29/20   - will follow     Brain masses:   - biopsy revealed grade 4 glioblastoma   - she started Radiation and chemo on 08/06/20   - follows with Dr. Oletta Darter     Bradycardia:  - occurs at night     HTN: controlled today   - goal BP <130/80   - on Norvasc    Lipids: FLP 01/17/20 total- 179, trig 101, HDL 73, LDL 86   - not on a statin     H/o PE: in 1999 after hysterectomy    DM II:      Nonsmoker:     Plan:     1. Encouraged heart healthy diet and exercise  2. Continue current medications  3. Follow up in 4 months      Thank you for allowing me to participate in the  care of your patient. Please feel free to contact me if there are further questions.     I am scribing for, and in the presence of, Dr. Julian Hy, MD for services provided on 08/21/2020.  Shiloh Huntsberry, SCRIBE    I personally performed the services described in this documentation, as scribed in my presence, and it is both accurate and complete.    Julian Hy, MD    Julian Hy, MD  08/21/2020, 10:30

## 2020-08-18 ENCOUNTER — Other Ambulatory Visit: Payer: Medicare PPO

## 2020-08-18 ENCOUNTER — Other Ambulatory Visit: Payer: Self-pay

## 2020-08-18 ENCOUNTER — Other Ambulatory Visit (HOSPITAL_COMMUNITY): Payer: Self-pay | Admitting: Internal Medicine

## 2020-08-18 ENCOUNTER — Other Ambulatory Visit (INDEPENDENT_AMBULATORY_CARE_PROVIDER_SITE_OTHER): Payer: Self-pay | Admitting: Pharmacist

## 2020-08-18 MED ORDER — TEMOZOLOMIDE 100 MG CAPSULE
100.0000 mg | ORAL_CAPSULE | Freq: Every day | ORAL | 0 refills | Status: DC
Start: 2020-08-18 — End: 2020-09-25

## 2020-08-18 MED ORDER — TEMOZOLOMIDE 20 MG CAPSULE
20.0000 mg | ORAL_CAPSULE | Freq: Every day | ORAL | 0 refills | Status: DC
Start: 2020-08-18 — End: 2020-09-25

## 2020-08-18 MED ORDER — TEMOZOLOMIDE 5 MG CAPSULE
10.0000 mg | ORAL_CAPSULE | Freq: Every day | ORAL | 0 refills | Status: DC
Start: 2020-08-18 — End: 2020-09-25

## 2020-08-18 NOTE — Telephone Encounter (Signed)
Specialty Pharmacy Therapy Assessment Note    - Assessment Completed: 08/18/2020  - Treatment Regimen: Temozolomide 120mg  daily x42 days w/ RT   - Start date: 08/06/20  - Indication: GBM  - Provider: Dr. Oletta Darter  - Patient: Carrie Escobar is a 74 y.o. female    Spoke with patient's brother and medical power of attorney Carrie Escobar for initial follow-up to assess efficacy and safety with Temozolomide. [name] started her treatment regimen on 08/06/20, by all accounts she is tolerating her medication well with minimal side effects. Carrie Escobar did discuss that she has been taking her Eliquis regularly, and is recovering from her pulmonary embolisms.     Adherence to regimen:  Reported 0 missed doses.     Side effects:  Patient noted the following side effects: constipation which is relieved with laxatives and benefiber. Carrie Escobar noted one episode of vomiting when he tried to hold her Zofran. I counseled him that the temozolomide can cause serious nausea and vomiting and Zofran should always be used. Carrie Escobar verbalized understanding.      Medication changes since last call include: Eliquis. No significant drug-drug interactions expected with Temozolomide. No other updates per patient. Reviewed Epic chart for meds/allergies/comorbids/labs/vitals with no significant issues identified for continuation of Temozolomide.     Advised patient to contact [facility] with future questions or concerns.     [place] Jullien Granquist, [facility]  08/18/2020, 15:16

## 2020-08-18 NOTE — Home Health (Signed)
SN greeted by sister at door when arriving for visit.Pt. sitting in recliner in living room.VS and assessment completed by SN.Pt. denies c/o pain.Pt. remains confused to time ,place and situation.Pt. ambulated with walker.Pt. continues with Radiation treatments and taking chemo medication.Medications reviewed and no changes in meds at this time.Next SN visit will be 08/20/20.

## 2020-08-18 NOTE — Case Communication (Signed)
Spoke with sister to patient and patient today in length about how patient is tolerating physical therapy while going through chemo at this time.  Sister thinks patient is doing fairly well with good and bad days, but overall enjoys the activity.  Advised that we would continue with skilled need being required and would discuss with PT and brother when he returns home plan of care.

## 2020-08-18 NOTE — Home Health (Signed)
Upon arrival patient seated at recliner , ending visit with SN.  Patient reports feeling well today , but tired.  Discussed with patient's sister who was present how patient is tolerating physical therapy while participating in chemo tx. Patient and sister voiced she is doing well and would like to continue with visits at this time, stating she likes exercise and has a hx of exercising prior to her diagnosis.  Patient and caregiver advised we would continue as long as there is a skilled need and would further discuss with PT. Patient tolerated standing ex well today with abiility to perform activity for 10 min before need for seated rest.  Patient's progress and performance in visits contiinues to wax and wean with good and bad days.  Patient appeared fairly clear today with good response to cues provided, need for one step instruction continues.  Plan for next visit to continue with gt training, balance training, strengthening BLE per PT POC with discussion to be made with PT about how to proceed.

## 2020-08-18 NOTE — Telephone Encounter (Signed)
Spoke with [name] (patient's MPOA) regarding his sister Dezyrae's Temozolomide prescriptions. Patient had called Dr. Levada Schilling office concerned that he would not have the full amount of Temozolomide available to complete with radiation for 6 weeks. I shared with him that medicare only allows 4 weeks of treatment at one time and that I will call him on 11/16 to schedule a refill and deliver before the 24th . At that point we can send the remaining 2 weeks of treatment. I reassured him that this prescriptions on file will work perfectly to complete her cycle.     [place] Annelisa Ryback, [place]  [phone], ext. [phone]

## 2020-08-19 ENCOUNTER — Encounter (HOSPITAL_COMMUNITY): Payer: Self-pay | Admitting: Internal Medicine

## 2020-08-19 ENCOUNTER — Other Ambulatory Visit: Payer: Medicare PPO

## 2020-08-19 ENCOUNTER — Ambulatory Visit: Payer: Medicare PPO | Attending: Internal Medicine | Admitting: Internal Medicine

## 2020-08-19 ENCOUNTER — Ambulatory Visit (HOSPITAL_COMMUNITY): Admission: RE | Admit: 2020-08-19 | Payer: Medicare PPO | Source: Ambulatory Visit

## 2020-08-19 ENCOUNTER — Other Ambulatory Visit: Payer: Self-pay

## 2020-08-19 ENCOUNTER — Ambulatory Visit (HOSPITAL_COMMUNITY): Payer: Medicare PPO

## 2020-08-19 VITALS — BP 115/69 | HR 74 | Temp 96.9°F | Resp 16 | Ht 64.37 in | Wt 133.0 lb

## 2020-08-19 DIAGNOSIS — R569 Unspecified convulsions: Secondary | ICD-10-CM

## 2020-08-19 DIAGNOSIS — C719 Malignant neoplasm of brain, unspecified: Secondary | ICD-10-CM

## 2020-08-19 DIAGNOSIS — Z5111 Encounter for antineoplastic chemotherapy: Secondary | ICD-10-CM

## 2020-08-19 DIAGNOSIS — I2699 Other pulmonary embolism without acute cor pulmonale: Secondary | ICD-10-CM

## 2020-08-19 DIAGNOSIS — Z7901 Long term (current) use of anticoagulants: Secondary | ICD-10-CM

## 2020-08-19 DIAGNOSIS — G40909 Epilepsy, unspecified, not intractable, without status epilepticus: Secondary | ICD-10-CM | POA: Insufficient documentation

## 2020-08-19 DIAGNOSIS — E041 Nontoxic single thyroid nodule: Secondary | ICD-10-CM

## 2020-08-19 DIAGNOSIS — Z86711 Personal history of pulmonary embolism: Secondary | ICD-10-CM | POA: Insufficient documentation

## 2020-08-19 LAB — CBC WITH DIFF
HCT: 30.9 % — ABNORMAL LOW (ref 36.0–45.0)
HGB: 10.6 g/dL — ABNORMAL LOW (ref 12.0–15.5)
MCH: 30.6 pg (ref 27.5–33.2)
MCHC: 34.5 g/dL (ref 32.0–36.0)
MCV: 88.7 fL (ref 82.0–97.0)
MPV: 8.1 fL (ref 7.4–10.5)
PLATELETS: 226 10*3/uL (ref 150–450)
RBC: 3.48 10*6/uL — ABNORMAL LOW (ref 4.00–5.10)
RDW: 17.2 % — ABNORMAL HIGH (ref 11.0–16.0)
WBC: 8.8 10*3/uL (ref 4.0–11.0)

## 2020-08-19 LAB — MANUAL DIFFERENTIAL
BAND %: 3 % (ref 0–4)
BASOPHIL %: 1 % (ref 0–3)
BASOPHIL ABSOLUTE: 0.09 10*3/uL (ref 0.00–0.10)
LYMPHOCYTE %: 5 % — ABNORMAL LOW (ref 15–43)
LYMPHOCYTE ABSOLUTE: 0.44 10*3/uL — ABNORMAL LOW (ref 1.00–4.80)
METAMYELOCYTE %: 1 % (ref 0–1)
METAMYELOCYTE ABSOLUTE: 0.09 10*3/uL
MONOCYTE %: 4 % — ABNORMAL LOW (ref 5–12)
MONOCYTE ABSOLUTE: 0.35 10*3/uL (ref 0.20–0.90)
MYELOCYTE %: 1 % — ABNORMAL HIGH (ref 0–0)
MYELOCYTE ABSOLUTE: 0.09 10*3/uL
NEUTROPHIL %: 85 % — ABNORMAL HIGH (ref 43–76)
NEUTROPHIL ABSOLUTE: 7.74 10*3/uL — ABNORMAL HIGH (ref 1.50–6.50)
PLATELET ESTIMATE: ADEQUATE
RBC MORPHOLOGY COMMENT: NORMAL
WBC MORPHOLOGY COMMENT: NORMAL
WBC: 8.8 10*3/uL

## 2020-08-19 NOTE — Progress Notes (Signed)
Return Patient Progress Note    Date: 08/19/2020    Name: Carrie Escobar  MRN: [id number]  DOB: 25-Feb-1946   Referring Physician: Elise Benne,*  Primary Care Provider: Kayleen Memos, MD  Reason for visit/consultation Other (f/u; provoked PE, GBM)      Interval History:     Carrie Escobar returns for routine follow-up regarding her GBM.  She started radiation to the brain on 10/28, and started the Temodar the same day.  She has had good tolerance to treatment so far without any significant nausea vomiting or constipation.  Her breathing is good she has no exertional dyspnea or chest pain.  Her brother and sister continue to support her at home.  She continues to get home health nursing services as well as physical therapy.   As always her brother does most of the talking for her. No recent falls-sometime she is confused and feels dizzy on standing up too fast.  Her brothers additionally concerned about her food intake which is very dismal although she is maintaining her weight.   A brother helps her with all the medications-she maintains compliance with the blood thinner that was started for recently discovered PE.  No rectal bleeding or melanotic stools since starting the blood thinner.  No easy bruising or bleeding while on Eliquis.   ____________________________________________________________________________________________________________________  ROS:       Review of Systems   Constitutional: Positive for appetite change (decrease) and fatigue.   Musculoskeletal: Positive for gait problem (patient came in a wheelchair).   Neurological: Positive for dizziness and gait problem (patient came in a wheelchair).        Generalized weakness   All other systems reviewed and are negative.     ____________________________________________________________________________________________________________________  Diagnosis-GBM, diagnosed September 2021  Stage-Multifocal GBM-Unresectable.  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  MSI Stable, MMR-proficient, tumor mutational burden low at 3 mutations/Mb.  PDL-1 expression negative.  No additional targetable mutation detected    ___________________________________________________________  Carrie Escobar was admitted to [facility] between 9/18 and 09/29 with worsening headaches, memory loss and confusion.    MRI BRAIN :Multiple enhancing masses in the left parietal, temporal and occipital lobes which are connected by masslike T2/FLAIR signal abnormality which also extends into the splenium of the corpus callosum. 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 The largest mass extends along the  ependymal lining of the left lateral ventricle. 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    Neurosurgery, Neurology and Medical Oncology were involved in her care.    07/01/2020-Left temporal Stealth guided burr hole and tumor biopsy (Left) done by Dr Ebony Hail. The left temporal mass showed evidence of gliosis and H of lesional tissue with doses. The left temporal mass showed extensively necrotic glioma-favoring GBM grade 4.    MGMT methylation-Not detected  1p loss-detected  19q loss-Not detected    Between 10/20-and 10/24 admitted to the hospital with cough and breathing difficulty.  Was diagnosed with pulmonary embolus-and started on Eliquis at discharge.  Plan for concurrent chemo RT using Temodar as a radiosensitizer.    Cycle 1 day 08/06/2020.         ______________________________________________________________________________________________________________________    Objective   Objective:   BP 115/69 (Site: Right, Patient Position: Sitting)    Pulse 74    Temp 36.1  C (96.9 F) (Thermal Scan)    Resp 16    Ht 1.635 m (5' 4.37")  Comment: *   Wt 60.3 kg (133 lb)    SpO2 98% Comment: ra   BMI 22.57 kg/m     ECOG Status: 2 - Ambulatory and capable of all selfcare, but unable to carry out any work activities.  Up and about more than 50% of waking hours.    Physical Exam  Vitals reviewed.   Constitutional:       General: She is not in acute distress.     Comments: Comes in a wheelchair   HENT:      Nose: No congestion.      Mouth/Throat:      Mouth: Mucous membranes are moist.      Pharynx: No oropharyngeal exudate.   Eyes:      General: No scleral icterus.     Extraocular Movements: Extraocular movements intact.      Conjunctiva/sclera: Conjunctivae normal.   Cardiovascular:      Rate and Rhythm: Normal rate and regular rhythm.      Heart sounds: No murmur heard.     Pulmonary:      Effort: Pulmonary effort is normal.      Breath sounds: No wheezing or rales.   Abdominal:      Palpations: Abdomen is soft. There is no mass.      Tenderness: There is no abdominal tenderness.   Musculoskeletal:      Cervical back: Normal range of motion.      Right lower leg: No edema.      Left lower leg: No edema.   Skin:     Capillary Refill: Capillary refill takes less than 2 seconds.   Neurological:      Comments: Moves all 4 extremities on command  Gait was not checked   Psychiatric:         Mood and Affect: Mood normal.      Comments: Speech is clear and coherent, she responds appropriately to all direct questions.  Mentation is slightly slow.             Past Medical History:   Diagnosis Date    Acute metabolic encephalopathy 97/41/6384    Brain mass 06/29/2020    MRI: Multiple enhancing masses in the left parietal, temporal, and occipital lobes which are connected by masslike T2 FLAIR signal abnormality.  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  Largest mass extends along the ependymal lining of left lateral ventricle.    Breast mass, right     benign    Cancer (CMS HCC)     Cataract     bilat    Diabetes mellitus, type 2 (CMS HCC)      Embolism (CMS HCC)     Family history of colon cancer     Heartburn     HTN (hypertension)     Memory impairment 06/30/2020    Nausea with vomiting     sister with PONV    Obesity     Pulmonary emboli     only once in 1999 after her hysterectomy    Thyroid nodule     left     Wears glasses     reading    Weight loss, unintentional 07/22/2020         Current Outpatient Medications   Medication Sig    acetaminophen (TYLENOL EXTRA STRENGTH) 500 mg Oral Tablet Take 500 mg by mouth Every 4 hours  as needed for Pain    amLODIPine (NORVASC) 10 mg Oral Tablet TAKE 1 TABLET BY MOUTH EVERY DAY    apixaban (ELIQUIS) 5 mg Oral Tablet Take 1 Tablet (5 mg total) by mouth Twice daily for 30 days    aspirin (ECOTRIN) 81 mg Oral Tablet, Delayed Release (E.C.) Take 1 Tablet (81 mg total) by mouth Once a day    calcium citrate-vitamin D3 (CITRACAL) 200 mg-6.25 mcg (250 unit) Oral Tablet Take 1 Tablet by mouth Once a day (Patient not taking: Reported on 08/05/2020 )    dexAMETHasone (DECADRON) 2 mg Oral Tablet Take 2 mg by mouth Twice daily. Indications: nausea and vomiting caused by cancer drugs    docusate sodium (COLACE) 100 mg Oral Capsule Take 100 mg by mouth Twice daily. Indications: constipation    Flaxseed Oil 1,000 mg Oral Capsule Take 1 Capsule by mouth Twice daily  Indications: herbal supplement     fluticasone propionate (FLONASE) 50 mcg/actuation Nasal Spray, Suspension 1 Spray by Each Nostril route Twice per day as needed (Patient taking differently: 1 Spray by Each Nostril route Twice per day as needed (allergy symptoms). Indications: inflammation of the nose due to an allergy)    levETIRAcetam (KEPPRA) 1,000 mg Oral Tablet Take 1 Tablet (1,000 mg total) by mouth Twice daily for 30 days    LORazepam (ATIVAN) 0.5 mg Oral Tablet Take 0.5 mg by mouth Once a day. Indications: anxious    metFORMIN (GLUCOPHAGE XR) 500 mg Oral Tablet Sustained Release 24 hr Take 1 Tablet (500 mg total) by mouth Once a  day    multivitamin Oral Tablet Take 1 Tab by mouth Once a day    ondansetron (ZOFRAN ODT) 8 mg Oral Tablet, Rapid Dissolve 1 Tablet (8 mg total) by Sublingual route Every 8 hours as needed for Nausea/Vomiting for up to 90 days    polyethylene glycol (MIRALAX) 17 gram Oral Powder in Packet Take 17 g by mouth 2X/day. Indications: constipation    temozolomide (TEMODAR) 100 mg Oral Capsule Take 1 Capsule (100 mg total) by mouth Once a day for 14 days with 2 other temozolomide prescriptions for 130 mg total    temozolomide (TEMODAR) 20 mg Oral Capsule Take 1 Capsule (20 mg total) by mouth Once a day for 14 days with 2 other temozolomide prescriptions for 130 mg total (Patient taking differently: Take 20 mg by mouth Once a day 120 mg total )    temozolomide (TEMODAR) 5 mg Oral Capsule Take 2 Capsules (10 mg total) by mouth Once a day for 14 days with 2 other temozolomide prescriptions for 130 mg total (Patient taking differently: Take 10 mg by mouth Once a day 120 mg total )    timolol maleate (TIMOPTIC) 0.25 % Ophthalmic Drops Instill 1 Drop into both eyes Twice daily      traMADoL (ULTRAM) 50 mg Oral Tablet Take 1 Tablet (50 mg total) by mouth Every 6 hours as needed (Patient not taking: Reported on 08/19/2020 )    trimethoprim-sulfamethoxazole (BACTRIM DS) 160-827m per tablet Take 1 Tablet (160 mg total) by mouth Every Monday, Wednesday and Friday for 18 doses (Patient taking differently: Take 1 Tablet by mouth Every Monday, Wednesday and Friday. Indications: urinary tract infection prevention)    wheat dextrin (BENEFIBER CLEAR SF, DEXTRIN, ORAL) Take 2 teaspoons by mouth 2X/day. Indications: constipation     Allergies as of 08/19/2020 - Reviewed 08/19/2020   Allergen Reaction Noted    Amoxicillin Rash 02/26/2013  Social History  Occupation:   Social History     Occupational History     Employer: [employer]    [place] [id number]   reports that she has never smoked. She has never used  smokeless tobacco. She reports previously being sexually active. She reports that she does not drink alcohol and does not use drugs.    Labs:  Labs reviewed and interpreted:  Results for orders placed or performed in visit on 08/19/20 (from the past 96 hour(s))   CBC/DIFF    Narrative    The following orders were created for panel order CBC/DIFF.  Procedure                               Abnormality         Status                     ---------                               -----------         ------                     CBC WITH YBWL[[id number]]                Abnormal            Final result               MANUAL DIFFERENTIAL[[id number]]          Abnormal            Final result                 Please view results for these tests on the individual orders.   CBC WITH DIFF   Result Value Ref Range    WBC 8.8 4.0 - 11.0 x103/uL    RBC 3.48 (L) 4.00 - 5.10 x106/uL    HGB 10.6 (L) 12.0 - 15.5 g/dL    HCT 30.9 (L) 36.0 - 45.0 %    MCV 88.7 82.0 - 97.0 fL    MCH 30.6 27.5 - 33.2 pg    MCHC 34.5 32.0 - 36.0 g/dL    RDW 17.2 (H) 11.0 - 16.0 %    PLATELETS 226 150 - 450 x103/uL    MPV 8.1 7.4 - 10.5 fL   MANUAL DIFFERENTIAL   Result Value Ref Range    NEUTROPHIL % 85 (H) 43 - 76 %    LYMPHOCYTE % 5 (L) 15 - 43 %    MONOCYTE % 4 (L) 5 - 12 %    BASOPHIL % 1 0 - 3 %    BAND % 3 0 - 4 %    METAMYELOCYTE % 1 0 - 1 %    MYELOCYTE % 1 (H) 0 - 0 %    NEUTROPHIL ABSOLUTE 7.74 (H) 1.50 - 6.50 x103/uL    LYMPHOCYTE ABSOLUTE 0.44 (L) 1.00 - 4.80 x103/uL    MONOCYTE ABSOLUTE 0.35 0.20 - 0.90 x103/uL    BASOPHIL ABSOLUTE 0.09 0.00 - 0.10 x103/uL    METAMYELOCYTE ABSOLUTE 0.09 x103/uL    MYELOCYTE ABSOLUTE 0.09 x103/uL    PLATELET ESTIMATE Adequate     RBC MORPHOLOGY COMMENT Normal     WBC MORPHOLOGY COMMENT Normal     WBC 8.8 x103/uL  Radiology: No new images for review      Assessment/Plan:     74 year old woman with:     1. GBM (glioblastoma multiforme) (CMS [place]):  2.Encounter for chemotherapy management:    MGMT-  Unmethylated  1p loss, 19q intact  Unresectable  Recommended plan  Is for Concurrent chemo-RT using Temodar as a radiosensitizer. RT and temodar started on 08/06/20.  The need for weekly CBC while on concurrent chemo RT was discussed, standing labs ordered.   Thebrotherdoes understand that this proposed6 weeks of concurrent chemo-RT is only the first phaseof treatment.He understands that a restaging brain MRI will be done a month after completion of concurrent chemo RT.  The poor prognosis associated with GBM was also discussed.   Clinical trial options was discussed-they are meeting with Dr Ralene Bathe at [facility] on Friday (tele med visit).     3. Seizure Disorder:  Continue with Keppra 1 gBIDPO.  Will ensure outpatient neurology follow-Up.    4. Thyroid nodule    -TIRADS 4 nodule in the left lobe of the thyroid gland.   -[place] dated 09/20-shows a complex nodule in the left thyroid lobe measuring 15 x 12 x 13 mm. The this is a mixed cystic and solid nodule which is hyper to isoechoic and taller than wider with smooth margins and calcifications-overall TI-RADS 4.  -IR US guided thyroid Biopsy was planned but more recent PE and the need for uninterrupted anticoagulation-will postpone thyroid biopsy for three months.     5. Pulmonary Embolus:   6.. Blood thinned due to long-term anticoagulant use  Diagnosed 07/29/20.   Continue daily Eliquis.    RTC 3 weeks.  Luz Lex, MD    CC:  PCP General:  Kayleen Memos, MD  [place] [address]      Portions of this note may be dictated using voice recognition software or a dictation service. Variances in spelling and vocabulary are possible and unintentional. Not all errors are caught/corrected. Please notify the [facility] if any discrepancies are noted or if the meaning of any statement is not clear.

## 2020-08-20 ENCOUNTER — Other Ambulatory Visit: Payer: Medicare PPO

## 2020-08-20 ENCOUNTER — Other Ambulatory Visit: Payer: Self-pay

## 2020-08-20 ENCOUNTER — Ambulatory Visit: Payer: Self-pay

## 2020-08-20 NOTE — Home Health (Signed)
Upon arrival patient seated in living room at couch.  Patient's brother present. [facility] arriving shortly after PTA arrival for intial visit to determine what help they can provide to patient and family.  Vitals assessed and WNL.  Patient tolerated 12 min standing activity before need for seated rest with increase in reps to 17 ea.  Patient needing seated rest after to rest BLEs due to fatigue but overall tolerated very well. Brother stating he is noticing patient is getting stronger and tolerating outing better lately, walking some to and from car but use of wc when at daily appts out of the home.  Patient and brother would like to continue as long as possible with services as patient appears to be slowly recovering from recent blood clots and hospitalization . Plan for next visit to continue gt training, strengthening, balance per PT POC.

## 2020-08-21 ENCOUNTER — Ambulatory Visit (INDEPENDENT_AMBULATORY_CARE_PROVIDER_SITE_OTHER): Payer: Medicare PPO | Admitting: INTERNAL MEDICINE - CARDIOVASCULAR DISEASE

## 2020-08-21 ENCOUNTER — Encounter (INDEPENDENT_AMBULATORY_CARE_PROVIDER_SITE_OTHER): Payer: Self-pay | Admitting: INTERNAL MEDICINE - CARDIOVASCULAR DISEASE

## 2020-08-21 ENCOUNTER — Ambulatory Visit: Payer: Medicare PPO | Attending: Hematology & Oncology | Admitting: Hematology & Oncology

## 2020-08-21 ENCOUNTER — Other Ambulatory Visit: Payer: Medicare PPO

## 2020-08-21 ENCOUNTER — Other Ambulatory Visit: Payer: Self-pay

## 2020-08-21 VITALS — BP 109/70 | HR 83 | Wt 134.8 lb

## 2020-08-21 DIAGNOSIS — I10 Essential (primary) hypertension: Secondary | ICD-10-CM

## 2020-08-21 DIAGNOSIS — R9431 Abnormal electrocardiogram [ECG] [EKG]: Secondary | ICD-10-CM

## 2020-08-21 DIAGNOSIS — C719 Malignant neoplasm of brain, unspecified: Secondary | ICD-10-CM | POA: Insufficient documentation

## 2020-08-21 DIAGNOSIS — Z789 Other specified health status: Secondary | ICD-10-CM

## 2020-08-21 DIAGNOSIS — I495 Sick sinus syndrome: Secondary | ICD-10-CM

## 2020-08-21 DIAGNOSIS — E119 Type 2 diabetes mellitus without complications: Secondary | ICD-10-CM

## 2020-08-21 DIAGNOSIS — Z86711 Personal history of pulmonary embolism: Secondary | ICD-10-CM

## 2020-08-21 DIAGNOSIS — I38 Endocarditis, valve unspecified: Secondary | ICD-10-CM

## 2020-08-21 DIAGNOSIS — G9389 Other specified disorders of brain: Secondary | ICD-10-CM

## 2020-08-21 NOTE — Case Communication (Signed)
Family cancelled visit today due to seeing MD and having a nurse from home instead coming in today.

## 2020-08-23 ENCOUNTER — Other Ambulatory Visit: Payer: Self-pay

## 2020-08-24 ENCOUNTER — Other Ambulatory Visit: Payer: Medicare PPO

## 2020-08-24 ENCOUNTER — Other Ambulatory Visit: Payer: Self-pay

## 2020-08-24 NOTE — Home Health (Signed)
Upon arrival patient seated at couch with sister present providing her morning 10am meds.  Patient instructed by caregiver to drink water following and patient was agreeable, appearing to be better managing fluid intake to prevent dehydration.  Patient agreeable to tx this date, vitals assessed and WNL. No report of pain and patient eager to participate.  With standing ex attempt, pt tolerated 20 reps ea, followed by standing balance training for static and dynamic challenging totalling 15 min activity before need for seate rest.  Patient's overall endurance and strength continue to build as she combats chemotherapy/treatments daily.  With rest and vital recheck, patient tolerated ambulation throughout home from bedroom to side door used to exit the home.  Due to temperatures, unable to further practice outdoors .  Patient demonstrated sbA balance and use of device today, 3-4 instances of walker on R side hitting to door jams, furniture, but patient making slow cautious moves to readjust when she feels /sees the restriction to her walker.  Plan for next visit RA with Earley Favor, DPT with patient and sister voicing understanding.

## 2020-08-25 ENCOUNTER — Other Ambulatory Visit: Payer: Self-pay

## 2020-08-25 ENCOUNTER — Other Ambulatory Visit (INDEPENDENT_AMBULATORY_CARE_PROVIDER_SITE_OTHER): Payer: Self-pay | Admitting: Pharmacist

## 2020-08-25 ENCOUNTER — Other Ambulatory Visit: Payer: Medicare PPO

## 2020-08-25 NOTE — Telephone Encounter (Signed)
Specialty Pharmacy Therapy Assessment Note    - Assessment Completed: 08/25/2020  - Treatment Regimen: Temozolomide 120mg  daily + Radiation  - Start date: 08/06/20  - Indication: GBM  - Provider: Dr. Oletta Darter  - Patient: Carrie Escobar is a 74 y.o. female    Spoke with patient's brother and PHI contact Carrie Escobar for initial follow-up to assess efficacy and safety with Temozolomide. He noted that she continues to tolerate treatment well with no major issues at this time. She does have underlying confusion and fatigue that is related more to overall disease burden. Her blood clots are being actively treated with Eliquis.     Adherence to regimen:  Reported 0 missed doses.     Side effects:  Patient noted the following side effects: fatigue and mild nausea. This is consistent with previous conversations, and now is improved since  Zofran is scheduled prior to every Temodar dose.      Medication changes since last call include: none. No significant drug-drug interactions expected with Temozolomide. No other updates per patient. Reviewed Epic chart for meds/allergies/comorbids/labs/vitals with no significant issues identified for continuation of Temozolomide.     Advised patient to contact [place] with future questions or concerns.     Carrie Escobar, [facility]  08/25/2020, 12:06

## 2020-08-25 NOTE — Home Health (Signed)
SN arrived for visit and greeted by sisiter at door. Pt. sitting on couch in living room watching t.v.VS and assessment completed by SN.Pt. denies pain or discomfort. Pt. remains confused to place,time and situation.Continues to go for radiation treatments every day M-F.C/o fatigue after radiation treatments.Discussed changing SN visits to 1x week and agreeable by caregiver.Next SN visit will be next week.

## 2020-08-26 ENCOUNTER — Other Ambulatory Visit: Payer: Self-pay

## 2020-08-26 ENCOUNTER — Ambulatory Visit: Payer: Medicare PPO | Attending: Internal Medicine

## 2020-08-26 ENCOUNTER — Other Ambulatory Visit: Payer: Medicare PPO

## 2020-08-26 ENCOUNTER — Other Ambulatory Visit (INDEPENDENT_AMBULATORY_CARE_PROVIDER_SITE_OTHER): Payer: Self-pay | Admitting: Geriatric Medicine

## 2020-08-26 DIAGNOSIS — Z5111 Encounter for antineoplastic chemotherapy: Secondary | ICD-10-CM | POA: Insufficient documentation

## 2020-08-26 DIAGNOSIS — C719 Malignant neoplasm of brain, unspecified: Secondary | ICD-10-CM

## 2020-08-26 LAB — CBC WITH DIFF
BASOPHIL #: <0.1 10*3/uL (ref <=0.20)
BASOPHIL %: 0 %
EOSINOPHIL #: <0.1 10*3/uL (ref <=0.50)
EOSINOPHIL %: 0 %
HCT: 33.5 % — ABNORMAL LOW (ref 34.8–46.0)
HGB: 10.9 g/dL — ABNORMAL LOW (ref 11.5–16.0)
IMMATURE GRANULOCYTE #: 0.12 10*3/uL — ABNORMAL HIGH (ref <0.10)
IMMATURE GRANULOCYTE %: 2 % — ABNORMAL HIGH (ref 0–1)
LYMPHOCYTE #: 0.44 10*3/uL — ABNORMAL LOW (ref 1.00–4.80)
LYMPHOCYTE %: 7 %
MCH: 29.5 pg (ref 26.0–32.0)
MCHC: 32.5 g/dL (ref 31.0–35.5)
MCV: 90.8 fL (ref 78.0–100.0)
MONOCYTE #: 0.43 10*3/uL (ref 0.20–1.10)
MONOCYTE %: 6 %
MPV: 10 fL (ref 8.7–12.5)
NEUTROPHIL #: 5.69 10*3/uL (ref 1.50–7.70)
NEUTROPHIL %: 85 %
PLATELETS: 198 10*3/uL (ref 150–400)
RBC: 3.69 10*6/uL — ABNORMAL LOW (ref 3.85–5.22)
RDW-CV: 18.9 % — ABNORMAL HIGH (ref 11.5–15.5)
WBC: 6.7 10*3/uL (ref 3.7–11.0)

## 2020-08-26 NOTE — Home Health (Signed)
patient seated on couch upon arrival of PT.  patient states she is doing just fine.  patient transferring and ambulating well around home, using fww.  Due to poor short term memory, needs constant direction.  patient performing standing exercise without difficulty and scoring well for Tinetti balance testing.  pateint demonstrating Tinetti 24/28, strength 4-4+/5 BLE, independent transfers and ambulation with fww, independent with HEP with caregiver support. patient, brother and PT in agreement to discharge to HEP.

## 2020-08-27 ENCOUNTER — Encounter (HOSPITAL_COMMUNITY): Payer: Self-pay | Admitting: Radiology

## 2020-08-27 ENCOUNTER — Ambulatory Visit: Payer: Self-pay

## 2020-08-27 ENCOUNTER — Other Ambulatory Visit: Payer: Self-pay

## 2020-08-27 ENCOUNTER — Ambulatory Visit (HOSPITAL_COMMUNITY): Payer: Self-pay

## 2020-08-27 NOTE — Telephone Encounter (Addendum)
[name] stopped by office after pt's radiation. She has sore in mouth on left side at upper gum line. No other sores at this time. No white coating on tongue.    Advised soft foods, Biotene and baking soda/salt mouth rinses.    Pt has upcoming dental appt. [name] wants to know if she needs to stop anticoags for that appt?    Dr. Oletta Darter notified.    Servando Snare  Triage Nurse    Per Dr. Oletta Darter, pt may continue blood thinner for routine dental cleaning. If more extensive work is necessary, the dentist will need to speak with Dr. Oletta Darter.

## 2020-08-28 ENCOUNTER — Other Ambulatory Visit: Payer: Medicare PPO

## 2020-08-28 ENCOUNTER — Other Ambulatory Visit: Payer: Self-pay

## 2020-08-28 ENCOUNTER — Ambulatory Visit: Payer: Self-pay

## 2020-08-28 ENCOUNTER — Telehealth (HOSPITAL_COMMUNITY): Payer: Self-pay | Admitting: Internal Medicine

## 2020-08-30 ENCOUNTER — Other Ambulatory Visit: Payer: Self-pay

## 2020-08-31 ENCOUNTER — Other Ambulatory Visit: Payer: Self-pay

## 2020-08-31 ENCOUNTER — Other Ambulatory Visit: Payer: Medicare PPO

## 2020-08-31 NOTE — Case Communication (Signed)
discharged  this date 08/31/20 no further SN needs

## 2020-08-31 NOTE — Home Health (Signed)
SN greeted by brother when arriving for visit.Pt. sitting on couch in living room for visit.VS and assessment completed by SN.Pt. denies c/o pain.Pt. remains confused to time, place and situation.Pt. follows commands well and is very pleasant.Discharge instructions reviewed with brother and understood by brother, all questions answered. Pt. discharged from [facility] this date 08/31/20.

## 2020-09-01 ENCOUNTER — Other Ambulatory Visit: Payer: Self-pay

## 2020-09-01 ENCOUNTER — Ambulatory Visit: Payer: Self-pay

## 2020-09-01 NOTE — Cancer Center Note (Signed)
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 female , who was found to have multiple enhancing masses in the left parietal, temporal and occipital lobes that extend into the splenium of the corpus callosum and underwent left temporal stealth guided burr hole and tumor biopsy on 09/23 that is consistent with WHO grade 4 GBM. Patient's husband was the primary source of history.    08/21/2020: Tolerating concurrent RT-TMZ well with no SAEs, start date 08/06/2020       Patient Active Problem List    Diagnosis    Multiple pulmonary emboli (CMS HCC)    GBM (glioblastoma multiforme) (CMS HCC)    Pulmonary infarction (CMS HCC)    DM (diabetes mellitus), type 2 (CMS HCC)    Brain mass    Memory impairment    Acute metabolic encephalopathy    Trigger finger of right hand    Atypical chest pain    Dependent edema    Routine general medical examination at a health care facility    Senile nuclear sclerosis    Rotator cuff syndrome of right shoulder    Shoulder pain, right    Lipoma of shoulder     Past Medical History:   Diagnosis Date    Acute metabolic encephalopathy 93/81/8299    Brain mass 06/29/2020    MRI: Multiple enhancing masses in the left parietal, temporal, and occipital lobes which are connected by masslike T2 FLAIR signal abnormality.  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  Largest mass extends along the ependymal lining of left lateral ventricle.    Breast mass, right     benign    Cancer (CMS HCC)     Cataract     bilat    Diabetes mellitus, type 2 (CMS HCC)     Embolism (CMS HCC)      Family history of colon cancer     Heartburn     HTN (hypertension)     Memory impairment 06/30/2020    Nausea with vomiting     sister with PONV    Obesity     Pulmonary emboli     only once in 1999 after her hysterectomy    Thyroid nodule     left     Wears glasses     reading    Weight loss, unintentional 07/22/2020         Past Surgical History:   Procedure Laterality Date    20550 - INJ SINGLE TENDON SHEATH/LIGAMENT, APONEUROSIS W/ [place] INTERP ONLY (AMB ONLY)  02/10/2016    COLONOSCOPY  2016    HX BREAST BIOPSY Right     BENIGN     HX CATARACT REMOVAL  10/2013    left    HX COLONOSCOPY      HX HYSTERECTOMY  1999    fibroid    HX ROTATOR CUFF REPAIR  11/04/2013    right shoulder         Current Outpatient Medications   Medication Sig    acetaminophen (TYLENOL EXTRA STRENGTH) 500 mg Oral Tablet  Take 500 mg by mouth Every 4 hours as needed for Pain    amLODIPine (NORVASC) 10 mg Oral Tablet TAKE 1 TABLET BY MOUTH EVERY DAY    apixaban (ELIQUIS) 5 mg Oral Tablet Take 1 Tablet (5 mg total) by mouth Twice daily for 30 days    aspirin (ECOTRIN) 81 mg Oral Tablet, Delayed Release (E.C.) Take 1 Tablet (81 mg total) by mouth Once a day    calcium citrate-vitamin D3 (CITRACAL) 200 mg-6.25 mcg (250 unit) Oral Tablet Take 1 Tablet by mouth Once a day (Patient not taking: Reported on 08/05/2020 )    dexAMETHasone (DECADRON) 2 mg Oral Tablet Take 2 mg by mouth Twice daily. Indications: nausea and vomiting caused by cancer drugs    docusate sodium (COLACE) 100 mg Oral Capsule Take 100 mg by mouth Twice daily. Indications: constipation    Flaxseed Oil 1,000 mg Oral Capsule Take 1 Capsule by mouth Twice daily  Indications: herbal supplement     fluticasone propionate (FLONASE) 50 mcg/actuation Nasal Spray, Suspension 1 Spray by Each Nostril route Twice per day as needed (Patient taking differently: 1 Spray by Each Nostril route Twice per day as needed (allergy symptoms). Indications: inflammation of the nose  due to an allergy)    levETIRAcetam (KEPPRA) 1,000 mg Oral Tablet TAKE 1 TABLET (1,000 MG TOTAL) BY MOUTH TWICE DAILY FOR 30 DAYS    LORazepam (ATIVAN) 0.5 mg Oral Tablet Take 0.5 mg by mouth Once a day. Indications: anxious    metFORMIN (GLUCOPHAGE XR) 500 mg Oral Tablet Sustained Release 24 hr Take 1 Tablet (500 mg total) by mouth Once a day    multivitamin Oral Tablet Take 1 Tab by mouth Once a day    ondansetron (ZOFRAN ODT) 8 mg Oral Tablet, Rapid Dissolve 1 Tablet (8 mg total) by Sublingual route Every 8 hours as needed for Nausea/Vomiting for up to 90 days    polyethylene glycol (MIRALAX) 17 gram Oral Powder in Packet Take 17 g by mouth 2X/day. Indications: constipation    temozolomide (TEMODAR) 100 mg Oral Capsule Take 1 Capsule (100 mg total) by mouth Once a day for 14 days with 2 other temozolomide prescriptions for 130 mg total (Patient not taking: Reported on 08/31/2020)    temozolomide (TEMODAR) 20 mg Oral Capsule Take 1 Capsule (20 mg total) by mouth Once a day for 14 days with 2 other temozolomide prescriptions for 130 mg total (Patient not taking: Reported on 08/31/2020)    temozolomide (TEMODAR) 5 mg Oral Capsule Take 2 Capsules (10 mg total) by mouth Once a day for 14 days with 2 other temozolomide prescriptions for 130 mg total (Patient not taking: Reported on 08/31/2020)    timolol maleate (TIMOPTIC) 0.25 % Ophthalmic Drops Instill 1 Drop into both eyes Twice daily      traMADoL (ULTRAM) 50 mg Oral Tablet Take 1 Tablet (50 mg total) by mouth Every 6 hours as needed (Patient not taking: Reported on 08/19/2020 )    wheat dextrin (BENEFIBER CLEAR SF, DEXTRIN, ORAL) Take 2 teaspoons by mouth 2X/day. Indications: constipation     Allergies   Allergen Reactions    Amoxicillin Rash     Family Medical History:     Problem Relation (Age of Onset)    Breast Cancer Maternal Grandmother    Cancer Mother, Sister    Diabetes Sister    Heart Attack Sister    Hypertension (High Blood Pressure)  Mother, Sister    No Known Problems Brother, Maternal Grandfather,  Paternal Grandmother, Paternal Grandfather, Daughter, Son, Maternal Aunt, Maternal Uncle, Paternal 20, Paternal Uncle, Other    Stroke Mother, Father            Social History     Tobacco Use    Smoking status: Never Smoker    Smokeless tobacco: Never Used   Substance Use Topics    Alcohol use: No     Alcohol/week: 0.0 standard drinks         Review of Systems:  Memory issue  Headaches  Confusion    Data Reviewed: labs and imaging    Assessment/Plan:  No diagnosis found.   No orders of the defined types were placed in this encounter.      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 Temporal stealth guided burr hole and tumor biopsy (07/02/2020)   # Memory, confusion and headaches    - I have reviewed all of the available medical records.  - I have reviewed serial imaging studies.  - post-biopsy course remains uncomplicated.  - Continue PT&OT  - continue  concurrent RT-Temozolomide X 6 weeks (start date 08/06/20)  - discussed the risks and benefits of TMZ 75 mg/m2 PO daily X 6 weeks  - script for TMZ, Bactrim MWF and anti-nausea meds by Dr. Oletta Darter  - Consented for tumor tissue genetics - EGFR gene Exon 21,p.[id number]  - patient's husband verbalized his understanding. Aware to call us should any questions or concerns occur.  - phone RTNV w/ me in 4 weeks  - labs qweekly  - Phone RTNV in 3 weeks  - BMRI 4 weeks after completion of 6 weeks course of RT-TMZ  Total time: 30 min over phone   Richrd Sox, MD

## 2020-09-02 ENCOUNTER — Other Ambulatory Visit: Payer: Self-pay

## 2020-09-02 ENCOUNTER — Other Ambulatory Visit (INDEPENDENT_AMBULATORY_CARE_PROVIDER_SITE_OTHER): Payer: Self-pay | Admitting: Geriatric Medicine

## 2020-09-02 NOTE — Nursing Note (Signed)
Services currently provided to patient:  Skilled Nursing  Physical Therapy  Aide    Case Conference Notes:  Agency discharge    Summary of Care:  Recent [facility] admission, discharged 10/24: Multiple pulmonary emboli. Confused, lacks decisional capacity, pulmonary infarction, Stage IV Glioblastoma with Malignancy,  Impaired memory, confused, gait issues, right shoulder pain, hx DM II metformin.   B  rother [name], MPOA who is currently residing with patient during this acute period.  Providing all care well    Priority Issues:  Chemo/radiation, possible transition to Hospice in the future.     Discipline Notes:  09/01/20-Pt. admitted to [facility] after being hospitaliz for confusion and diagnosed with Neoplasm of brain.Pt. is on oral chemo medication Temadar and is receiving radiation treatments daily M-F.Pt. remains pleasantly confused and cooperative. Ambulates w, ith walker with stand by assist.Family stays with pt. around the clock in her home.  patient has been seen 2 times per week for strengthening, balance, gait, step, HEP.  patient transferring and ambulating well around home, using fww.  Due to poor short term memory, needs constant direction.  patient performing standing exercise with, out difficulty and scoring well for Tinetti balance testing.  pateint demonstrating Tinetti 24/28, strength 4-4+/5 BLE, independent transfers and ambulation with fww, independent with HEP with caregiver support.  patient, PT, and brother in agreement,  to discharge to HEP.      No Value exists for the SDE: [id number]  No Value exists for the SDE: [facility]  MSW completed initial visit with patient brother/caregiver today. Patient was in living room visiting with a friend. Patient is easily confused due a brain tumor. PCG states she can't remember names and often is confused about ADLs. She will look at , her clothes and not understand what or how to put on her shirt. PCG lives in [place] but is retired and is going to be staying with her  most of October and November except for couple of days when he needs to go home for appointments. Patient's brother and,  sister can help when he isn't here but they are looking for other resources to help alleviate some of the caretaking. PCG states patient has too much money in savings to qualify for anything and he is looking for night time only at this point. MSW e, ducated patient caregiver on Private Duty caregivers and provided him with a list of agencies as well as brochures on [employer], [employer], and [facility] [facility]. PCG denied wanting MSW to call to set up a consultation at this time but wil, l do it soon. PCG also inquired about getting incapacity letter from PCP so his Durable and Medical POA becomes effective. MSW explained he would have to get doctor to do that. MSW found in the chart where a doctor in [facility] did sign that patient lacks , capacity. PCG will call PCP today regarding this as he wants to get added to Patient accounts. PCG denied any further needs stating he cooks and cleans and transports patient where she needs to go. PCG will call MSW if new needs arise.

## 2020-09-03 ENCOUNTER — Other Ambulatory Visit: Payer: Self-pay

## 2020-09-04 ENCOUNTER — Other Ambulatory Visit: Payer: Self-pay

## 2020-09-04 ENCOUNTER — Ambulatory Visit: Payer: Self-pay

## 2020-09-09 ENCOUNTER — Other Ambulatory Visit: Payer: Self-pay

## 2020-09-09 ENCOUNTER — Encounter (HOSPITAL_COMMUNITY): Payer: Self-pay | Admitting: Internal Medicine

## 2020-09-09 ENCOUNTER — Ambulatory Visit: Payer: Medicare PPO | Attending: Internal Medicine | Admitting: Internal Medicine

## 2020-09-09 VITALS — BP 121/75 | HR 67 | Temp 96.8°F | Resp 16 | Ht 64.37 in | Wt 129.4 lb

## 2020-09-09 DIAGNOSIS — E041 Nontoxic single thyroid nodule: Secondary | ICD-10-CM | POA: Insufficient documentation

## 2020-09-09 DIAGNOSIS — R569 Unspecified convulsions: Secondary | ICD-10-CM

## 2020-09-09 DIAGNOSIS — Z5111 Encounter for antineoplastic chemotherapy: Secondary | ICD-10-CM

## 2020-09-09 DIAGNOSIS — G40909 Epilepsy, unspecified, not intractable, without status epilepticus: Secondary | ICD-10-CM | POA: Insufficient documentation

## 2020-09-09 DIAGNOSIS — I2699 Other pulmonary embolism without acute cor pulmonale: Secondary | ICD-10-CM

## 2020-09-09 DIAGNOSIS — Z7901 Long term (current) use of anticoagulants: Secondary | ICD-10-CM | POA: Insufficient documentation

## 2020-09-09 DIAGNOSIS — C719 Malignant neoplasm of brain, unspecified: Secondary | ICD-10-CM | POA: Insufficient documentation

## 2020-09-09 DIAGNOSIS — Z86711 Personal history of pulmonary embolism: Secondary | ICD-10-CM | POA: Insufficient documentation

## 2020-09-09 NOTE — Progress Notes (Unsigned)
Return Patient Progress Note    Date: 09/09/2020  Name: Carrie Escobar  MRN: [id number]  DOB: 05/28/1946   Referring Physician: Elise Benne,*  Primary Care Provider: Kayleen Memos, MD  Reason for visit/consultation Other (f/u; provoked PE, GBM)  Comes in with her brother.    Interval History:     Carrie Escobar returns for routine follow-up of her GBM.  She has completed an abbreviated course of radiation treatments, along with adjuvant daily Temodar.  Overall had excellent tolerance to initial course of therapy.  Her gait is unsteady, and but the thankfully no recent falls.  No seizures. She  continues to take her blood thinner without fail.Marland Kitchen  Her neurologic status is stable-she gets considerable help from her siblings all of whom have been very supportive.  Her brother accompanies her to various doctors appointments.  She is excited about her upcoming 13 nd birthday.    ROS:       Review of Systems   Endocrine:        Patient felt really hot last night   Musculoskeletal: Positive for gait problem (uses a walker. Patient had a fall last week).   Neurological: Positive for gait problem (uses a walker. Patient had a fall last week).   All other systems reviewed and are negative.     _________________________________________________________________________________________________________________    Diagnosis-GBM, diagnosed September 2021  Stage-Multifocal GBM-Unresectable.  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  MSI Stable, MMR-proficient, tumor mutational burden low at 3 mutations/Mb.  PDL-1 expression negative.  No additional targetable mutation detected    ___________________________________________________________  Carrie Rinehimer was admitted to [facility] between 9/18 and 09/29 with  worsening headaches, memory loss and confusion.    MRI BRAIN :Multiple enhancing masses in the left parietal, temporal and occipital lobes which are connected by masslike T2/FLAIR signal abnormality which also extends into the splenium of the corpus callosum. 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 The largest mass extends along the  ependymal lining of the left lateral ventricle. 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    Neurosurgery, Neurology and Medical Oncology were involved in her care.    07/01/2020-Left temporal Stealth guided burr hole and tumor biopsy (Left) done by Dr Ebony Hail. The left temporal mass showed evidence of gliosis and H of lesional tissue with doses. The left temporal mass showed extensively necrotic glioma-favoring GBM grade 4.    MGMT methylation-Not detected  1p loss-detected  19q loss-Not detected    Between 10/20-and 10/24 admitted to the hospital with cough and breathing difficulty.  Was diagnosed with pulmonary embolus-and started on Eliquis at discharge.  Plan for concurrent chemo RT using Temodar as a radiosensitizer.    Cycle 1 day 08/06/2020.  Completed chemotherapy with Temodar with radiation treatments on 08/27/2020.        _________________________________________________________________________________________________________________    Objective   Objective:   BP 121/75   Pulse 67   Temp 36 C (96.8 F) (Thermal Scan)   Resp 16   Ht 1.635 m (5' 4.37") Comment: *  Wt 58.7 kg (129 lb 6.4 oz)   SpO2 99% Comment: ra  BMI 21.96 kg/m       ECOG Status: 2 - Ambulatory and capable of all selfcare, but unable to carry out any work activities.  Up and about  more than 50% of waking hours.    Physical Exam  Vitals reviewed.   Constitutional:       General: She is not in acute distress.     Appearance: She is not ill-appearing.      Comments: Walks slowly with a walker   HENT:      Mouth/Throat:      Mouth: Mucous membranes are moist.    Eyes:      General: No scleral icterus.     Extraocular Movements: Extraocular movements intact.      Conjunctiva/sclera: Conjunctivae normal.   Cardiovascular:      Rate and Rhythm: Normal rate and regular rhythm.      Heart sounds: No murmur heard.     Pulmonary:      Effort: Pulmonary effort is normal.      Breath sounds: No wheezing or rales.   Abdominal:      Palpations: Abdomen is soft. There is no mass.      Tenderness: There is no abdominal tenderness.   Musculoskeletal:         General: Normal range of motion.      Cervical back: Neck supple.   Skin:     General: Skin is warm.      Coloration: Skin is not jaundiced.   Neurological:      Comments: Oriented to place and person but not to time.  Mentation is slightly slow.  Although responds appropriately to questions, and follows commands appropriately   Psychiatric:      Comments: Affect relatively flat          Past Medical History:   Diagnosis Date   . Acute metabolic encephalopathy 16/07/9603   . Brain mass 06/29/2020    MRI: Multiple enhancing masses in the left parietal, temporal, and occipital lobes which are connected by masslike T2 FLAIR signal abnormality.  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  Largest mass extends along the ependymal lining of left lateral ventricle.   . Breast mass, right     benign   . Cancer (CMS [place])    . Cataract     bilat   . Diabetes mellitus, type 2 (CMS HCC)    . Embolism (CMS HCC)    . Family history of colon cancer    . Heartburn    . HTN (hypertension)    . Memory impairment 06/30/2020   . Nausea with vomiting     sister with PONV   . Obesity    . Pulmonary emboli     only once in 1999 after her hysterectomy   . Thyroid nodule     left    . Wears glasses     reading   . Weight loss, unintentional 07/22/2020         Current Outpatient Medications   Medication Sig   . acetaminophen (TYLENOL EXTRA STRENGTH) 500 mg Oral Tablet Take 500 mg by mouth Every 4 hours as needed for Pain   . amLODIPine (NORVASC)  10 mg Oral Tablet TAKE 1 TABLET BY MOUTH EVERY DAY   . aspirin (ECOTRIN) 81 mg Oral Tablet, Delayed Release (E.C.) Take 1 Tablet (81 mg total) by mouth Once a day   . calcium citrate-vitamin D3 (CITRACAL) 200 mg-6.25 mcg (250 unit) Oral Tablet Take 1 Tablet by mouth Once a day (Patient not taking: Reported on 08/05/2020 )   . dexAMETHasone (DECADRON) 2 mg Oral Tablet Take 2 mg by mouth Twice daily. Indications: nausea and vomiting  caused by cancer drugs   . docusate sodium (COLACE) 100 mg Oral Capsule Take 100 mg by mouth Twice daily. Indications: constipation   . Flaxseed Oil 1,000 mg Oral Capsule Take 1 Capsule by mouth Twice daily  Indications: herbal supplement    . fluticasone propionate (FLONASE) 50 mcg/actuation Nasal Spray, Suspension 1 Spray by Each Nostril route Twice per day as needed (Patient taking differently: 1 Spray by Each Nostril route Twice per day as needed (allergy symptoms). Indications: inflammation of the nose due to an allergy)   . levETIRAcetam (KEPPRA) 1,000 mg Oral Tablet TAKE 1 TABLET (1,000 MG TOTAL) BY MOUTH TWICE DAILY FOR 30 DAYS   . LORazepam (ATIVAN) 0.5 mg Oral Tablet Take 0.5 mg by mouth Once a day. Indications: anxious   . metFORMIN (GLUCOPHAGE XR) 500 mg Oral Tablet Sustained Release 24 hr Take 1 Tablet (500 mg total) by mouth Once a day   . multivitamin Oral Tablet Take 1 Tab by mouth Once a day   . ondansetron (ZOFRAN ODT) 8 mg Oral Tablet, Rapid Dissolve 1 Tablet (8 mg total) by Sublingual route Every 8 hours as needed for Nausea/Vomiting for up to 90 days   . polyethylene glycol (MIRALAX) 17 gram Oral Powder in Packet Take 17 g by mouth 2X/day. Indications: constipation   . timolol maleate (TIMOPTIC) 0.25 % Ophthalmic Drops Instill 1 Drop into both eyes Twice daily     . traMADoL (ULTRAM) 50 mg Oral Tablet Take 1 Tablet (50 mg total) by mouth Every 6 hours as needed (Patient not taking: Reported on 08/19/2020 )   . wheat dextrin (BENEFIBER CLEAR SF, DEXTRIN, ORAL) Take 2  teaspoons by mouth 2X/day. Indications: constipation (Patient not taking: Reported on 09/09/2020 )     Allergies as of 09/09/2020 - Reviewed 09/09/2020   Allergen Reaction Noted   . Amoxicillin Rash 02/26/2013       Social History  Occupation:   Social History     Occupational History     Employer: [employer]    [place] [id number]   reports that she has never smoked. She has never used smokeless tobacco. She reports previously being sexually active. She reports that she does not drink alcohol and does not use drugs.    Labs: Labs reviewed and interpreted:  Results for ALEISHA, PAONE (MRN [id number]) as of 09/09/2020 10:20   Ref. Range 08/26/2020 14:00   WBC Latest Ref Range: 3.7 - 11.0 x10^3/uL 6.7   HGB Latest Ref Range: 11.5 - 16.0 g/dL 10.9 (L)   HCT Latest Ref Range: 34.8 - 46.0 % 33.5 (L)   PLATELET COUNT Latest Ref Range: 150 - 400 x10^3/uL 198   RBC Latest Ref Range: 3.85 - 5.22 x10^6/uL 3.69 (L)   MCV Latest Ref Range: 78.0 - 100.0 fL 90.8   MCHC Latest Ref Range: 31.0 - 35.5 g/dL 32.5   MCH Latest Ref Range: 26.0 - 32.0 pg 29.5   RDW-CV Latest Ref Range: 11.5 - 15.5 % 18.9 (H)   MPV Latest Ref Range: 8.7 - 12.5 fL 10.0   PMN'S Latest Units: % 85   LYMPHOCYTES Latest Units: % 7   EOSINOPHIL Latest Units: % 0   MONOCYTES Latest Units: % 6   BASOPHILS Latest Units: % 0   IMMATURE GRANULOCYTE % Latest Ref Range: 0 - 1 % 2 (H)   IMMATURE GRANULOCYTE # Latest Ref Range: <0.10 x10^3/uL 0.12 (H)   PMN ABS Latest Ref Range: 1.50 - 7.70 x10^3/uL 5.69  LYMPHS ABS Latest Ref Range: 1.00 - 4.80 x10^3/uL 0.44 (L)   EOS ABS Latest Ref Range: <=0.50 x10^3/uL <0.10   MONOS ABS Latest Ref Range: 0.20 - 1.10 x10^3/uL 0.43   BASOS ABS Latest Ref Range: <=0.20 x10^3/uL <0.10     Radiology: No new images for review.        Assessment/Plan:     74 year old woman with:     1. GBM (glioblastoma multiforme) (CMS [place]):    MGMT- Unmethylated  1p loss, 19q intact  Unresectable  Completed concurrent chemo-RT using  Temodar as a radiosensitizer.RT and temodar started on 08/06/20, completed on 08/27/2020  MRI planned for around Dec 20 (ordered today).  Dr Kallie Edward notes were reviewed.      2. Seizure Disorder:  Continue with Keppra 1 gBIDPO.  Will ensure outpatient neurology follow-Up.    3. Thyroid nodule    -TIRADS 4 nodule in the left lobe of the thyroid gland.   -[place] dated 09/20-shows a complex nodule in the left thyroid lobe measuring 15 x 12 x 13 mm. The this is a mixed cystic and solid nodule which is hyper to isoechoic and taller than wider with smooth margins and calcifications-overall TI-RADS 4.  -IR US guided thyroid Biopsy was planned but more recent PE and the need foruninterrupted anticoagulation-will postponethyroid biopsy forthree months.     4. Pulmonary Embolus:   5. Blood thinned due to long-term anticoagulant use  Diagnosed 07/29/20.  Continue daily Eliquis.                     RTC 5-6 weeks for fuv and test results discussion.                      Luz Lex, MD    CC:  PCP General:  Kayleen Memos, MD  [address]  [place] [address]

## 2020-09-10 ENCOUNTER — Encounter (HOSPITAL_COMMUNITY): Payer: Self-pay | Admitting: Internal Medicine

## 2020-09-14 ENCOUNTER — Ambulatory Visit: Payer: Medicare PPO | Attending: Hematology & Oncology | Admitting: Hematology & Oncology

## 2020-09-14 ENCOUNTER — Other Ambulatory Visit: Payer: Self-pay

## 2020-09-14 DIAGNOSIS — C719 Malignant neoplasm of brain, unspecified: Secondary | ICD-10-CM | POA: Insufficient documentation

## 2020-09-15 ENCOUNTER — Other Ambulatory Visit (INDEPENDENT_AMBULATORY_CARE_PROVIDER_SITE_OTHER): Payer: Self-pay | Admitting: HOSPITALIST

## 2020-09-15 ENCOUNTER — Other Ambulatory Visit (INDEPENDENT_AMBULATORY_CARE_PROVIDER_SITE_OTHER): Payer: Self-pay | Admitting: Geriatric Medicine

## 2020-09-15 DIAGNOSIS — R7301 Impaired fasting glucose: Secondary | ICD-10-CM

## 2020-09-17 ENCOUNTER — Encounter (INDEPENDENT_AMBULATORY_CARE_PROVIDER_SITE_OTHER): Payer: Medicare PPO | Admitting: Geriatric Medicine

## 2020-09-18 ENCOUNTER — Encounter (HOSPITAL_COMMUNITY): Payer: Self-pay

## 2020-09-18 NOTE — Nursing Note (Signed)
Patients sister called. She is currently staying with patient while her brother is back home in [place], [place].  She states patient was sitting in her chair "earlier today and repositioned her self in chair". While repositioning she experienced "some pain" in her upper right, dorsal area of shoulder. Discomfort lasted "only a few minutes".  Patient is not experiencing any pain or discomfort at present.  She stated pain went away after positioning. No complaints of chest pain, SOB, numbness, tingling, or headache. Sister says patient's smile is symmetric and equal on both sides. She was able to stand up from chair with minimal assistance. Speech clear and logical. Sister states Payden is able to move arms and legs without problem.  Sister says, "she was actually easier to help get out of the chair this time".  Encouraged sister to continue monitoring Celene. If notes any  changes or if pain returns she is to call the doctor on call.  Phone number and instructions on how to contact them provided.  If patient develops any chest pain, SOB, numbness in arms or severe headache she is to call 911.  Sister verbalized understanding.

## 2020-09-19 NOTE — Cancer Center Note (Signed)
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 female , who was found to have multiple enhancing masses in the left parietal, temporal and occipital lobes that extend into the splenium of the corpus callosum and underwent left temporal stealth guided burr hole and tumor biopsy on 09/23 that is consistent with WHO grade 4 GBM. Patient's husband was the primary source of history.    08/21/2020: Tolerating concurrent RT-TMZ well with no SAEs, start date 08/06/2020     09/11/2020: Tolerating concurrent rt-tmz well with no SAEs. Brother was the primary source of information.       Patient Active Problem List    Diagnosis   . Multiple pulmonary emboli (CMS HCC)   . GBM (glioblastoma multiforme) (CMS HCC)   . Pulmonary infarction (CMS HCC)   . DM (diabetes mellitus), type 2 (CMS HCC)   . Brain mass   . Memory impairment   . Acute metabolic encephalopathy   . Trigger finger of right hand   . Atypical chest pain   . Dependent edema   . Routine general medical examination at a health care facility   . Senile nuclear sclerosis   . Rotator cuff syndrome of right shoulder   . Shoulder pain, right   . Lipoma of shoulder     Past Medical History:   Diagnosis Date   . Acute metabolic encephalopathy 59/16/3846   . Brain mass 06/29/2020    MRI: Multiple enhancing masses in the left parietal, temporal, and occipital lobes which are connected by masslike T2 FLAIR signal abnormality.  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  Largest mass extends along the ependymal lining of left lateral ventricle.   . Breast mass, right     benign   .  Cancer (CMS [place])    . Cataract     bilat   . Diabetes mellitus, type 2 (CMS HCC)    . Embolism (CMS HCC)    . Family history of colon cancer    . Heartburn    . HTN (hypertension)    . Memory impairment 06/30/2020   . Nausea with vomiting     sister with PONV   . Obesity    . Pulmonary emboli     only once in 1999 after her hysterectomy   . Thyroid nodule     left    . Wears glasses     reading   . Weight loss, unintentional 07/22/2020         Past Surgical History:   Procedure Laterality Date   . 65993 - INJ SINGLE TENDON SHEATH/LIGAMENT, APONEUROSIS W/ [place] INTERP ONLY (AMB ONLY)  02/10/2016   . COLONOSCOPY  2016   . HX BREAST BIOPSY Right     BENIGN    . HX CATARACT REMOVAL  10/2013    left   . HX COLONOSCOPY     . HX HYSTERECTOMY  1999    fibroid   . HX ROTATOR CUFF REPAIR  11/04/2013    right shoulder  Current Outpatient Medications   Medication Sig   . acetaminophen (TYLENOL EXTRA STRENGTH) 500 mg Oral Tablet Take 500 mg by mouth Every 4 hours as needed for Pain   . amLODIPine (NORVASC) 10 mg Oral Tablet TAKE 1 TABLET BY MOUTH EVERY DAY   . aspirin (ECOTRIN) 81 mg Oral Tablet, Delayed Release (E.C.) Take 1 Tablet (81 mg total) by mouth Once a day   . calcium citrate-vitamin D3 (CITRACAL) 200 mg-6.25 mcg (250 unit) Oral Tablet Take 1 Tablet by mouth Once a day (Patient not taking: Reported on 08/05/2020 )   . dexAMETHasone (DECADRON) 2 mg Oral Tablet Take 2 mg by mouth Twice daily. Indications: nausea and vomiting caused by cancer drugs   . docusate sodium (COLACE) 100 mg Oral Capsule Take 100 mg by mouth Twice daily. Indications: constipation   . Flaxseed Oil 1,000 mg Oral Capsule Take 1 Capsule by mouth Twice daily  Indications: herbal supplement    . fluticasone propionate (FLONASE) 50 mcg/actuation Nasal Spray, Suspension 1 Spray by Each Nostril route Twice per day as needed (Patient taking differently: 1 Spray by Each Nostril route Twice per day as needed (allergy symptoms). Indications: inflammation of  the nose due to an allergy)   . levETIRAcetam (KEPPRA) 1,000 mg Oral Tablet TAKE 1 TABLET (1,000 MG TOTAL) BY MOUTH TWICE DAILY FOR 30 DAYS   . LORazepam (ATIVAN) 0.5 mg Oral Tablet Take 0.5 mg by mouth Once a day. Indications: anxious   . metFORMIN (GLUCOPHAGE XR) 500 mg Oral Tablet Sustained Release 24 hr TAKE 1 TABLET (500 MG TOTAL) BY MOUTH EVERY DAY.   . multivitamin Oral Tablet Take 1 Tab by mouth Once a day   . ondansetron (ZOFRAN ODT) 8 mg Oral Tablet, Rapid Dissolve 1 Tablet (8 mg total) by Sublingual route Every 8 hours as needed for Nausea/Vomiting for up to 90 days   . polyethylene glycol (MIRALAX) 17 gram Oral Powder in Packet Take 17 g by mouth 2X/day. Indications: constipation   . timolol maleate (TIMOPTIC) 0.25 % Ophthalmic Drops Instill 1 Drop into both eyes Twice daily     . traMADoL (ULTRAM) 50 mg Oral Tablet Take 1 Tablet (50 mg total) by mouth Every 6 hours as needed (Patient not taking: Reported on 08/19/2020 )   . wheat dextrin (BENEFIBER CLEAR SF, DEXTRIN, ORAL) Take 2 teaspoons by mouth 2X/day. Indications: constipation (Patient not taking: Reported on 09/09/2020 )     Allergies   Allergen Reactions   . Amoxicillin Rash     Family Medical History:     Problem Relation (Age of Onset)    Breast Cancer Maternal Grandmother    Cancer Mother, Sister    Diabetes Sister    Heart Attack Sister    Hypertension (High Blood Pressure) Mother, Sister    No Known Problems Brother, Maternal Grandfather, Paternal Grandmother, Paternal 57, Daughter, Son, Maternal 41, Maternal Uncle, Paternal 59, Paternal Uncle, Other    Stroke Mother, Father            Social History     Tobacco Use   . Smoking status: Never Smoker   . Smokeless tobacco: Never Used   Substance Use Topics   . Alcohol use: No     Alcohol/week: 0.0 standard drinks         Review of Systems:  Memory issue  Headaches  Confusion    Data Reviewed: labs and imaging    Assessment/Plan:      # IDH WT unmethylated MGMT  WHO grade 4  multifocal Glioblastoma   # S/P L. Temporal stealth guided burr hole and tumor biopsy (07/02/2020)   # Memory, confusion and headaches - per brother slightly improved    - I have reviewed all of the available medical records.  - I have reviewed serial imaging studies.  - post-biopsy course remains uncomplicated.  - Continue PT&OT  - continue  concurrent RT-Temozolomide X 6 weeks (start date 08/06/20)  - Consented for tumor tissue genetics - EGFR gene Exon 21,[email or web address]  - patient's brother verbalized his understanding. Aware to call us should any questions or concerns occur.  - phone RTNV w/ me in 4 weeks w/ perfusion weight [facility] locally  - continue Qweekly labs  Total time: 30 mins over phone   Richrd Sox, MD

## 2020-09-21 ENCOUNTER — Other Ambulatory Visit (INDEPENDENT_AMBULATORY_CARE_PROVIDER_SITE_OTHER): Payer: Self-pay | Admitting: Geriatric Medicine

## 2020-09-21 ENCOUNTER — Telehealth (HOSPITAL_COMMUNITY): Payer: Self-pay

## 2020-09-21 MED ORDER — LEVETIRACETAM 1,000 MG TABLET
1000.0000 mg | ORAL_TABLET | Freq: Two times a day (BID) | ORAL | 0 refills | Status: DC
Start: 2020-09-21 — End: 2020-09-25

## 2020-09-21 NOTE — Addendum Note (Signed)
Addended by: Luz Lex on: 09/21/2020 01:03 PM     Modules accepted: Orders

## 2020-09-21 NOTE — Telephone Encounter (Signed)
[name] called. His sister had helped Carrie Escobar in the bathroom, and she noticed a blood clot on the toilet paper when she was wiping her on Saturday. She took a picture of the blood clot, but Carrie Escobar  does not know how large it was. No bleeding noted before or since. He was concerned because pt is on blood thinners.     Pt is sleeping well, bowels are moving normally, she is eating and drinking well. Advised brother to monitor for any new occurrence. They are seeing her PCP on 12/21. Advised them to bring up the blood clot at the visit.    Dr. Oletta Darter notified.    Servando Snare  Triage Nurse

## 2020-09-22 ENCOUNTER — Telehealth (HOSPITAL_COMMUNITY): Payer: Self-pay

## 2020-09-22 NOTE — Telephone Encounter (Addendum)
[name] called to report he noticed pt was dragging her rt foot on the way to the bathroom last night. She is also having trouble using her right hand. She is able to squeeze with full strength but doesn't seem to know where her hand is and is having difficulty holding things. Pt has no facial droop, speech is clear and unchanged.     Pt has brain MRI scheduled for Monday.    Will notify Dr. Oletta Darter.    Servando Snare  Triage Nurse    Per Dr. Oletta Darter, if symptoms appear again, pt needs to go directly to ED. [name] verbalized understanding.

## 2020-09-23 ENCOUNTER — Inpatient Hospital Stay
Admission: EM | Admit: 2020-09-23 | Discharge: 2020-09-25 | DRG: 080 | Disposition: A | Discharge status: Hospice | Payer: Medicare PPO | Attending: Internal Medicine | Admitting: Internal Medicine

## 2020-09-23 ENCOUNTER — Emergency Department (HOSPITAL_COMMUNITY): Payer: Medicare PPO

## 2020-09-23 ENCOUNTER — Inpatient Hospital Stay (HOSPITAL_COMMUNITY): Payer: Medicare PPO | Admitting: Internal Medicine

## 2020-09-23 ENCOUNTER — Emergency Department (EMERGENCY_DEPARTMENT_HOSPITAL): Payer: Medicare PPO

## 2020-09-23 ENCOUNTER — Encounter (HOSPITAL_COMMUNITY): Payer: Self-pay

## 2020-09-23 ENCOUNTER — Other Ambulatory Visit: Payer: Self-pay

## 2020-09-23 DIAGNOSIS — G935 Compression of brain: Principal | ICD-10-CM | POA: Diagnosis present

## 2020-09-23 DIAGNOSIS — R627 Adult failure to thrive: Secondary | ICD-10-CM | POA: Diagnosis present

## 2020-09-23 DIAGNOSIS — C719 Malignant neoplasm of brain, unspecified: Principal | ICD-10-CM

## 2020-09-23 DIAGNOSIS — E876 Hypokalemia: Secondary | ICD-10-CM | POA: Diagnosis present

## 2020-09-23 DIAGNOSIS — E119 Type 2 diabetes mellitus without complications: Secondary | ICD-10-CM | POA: Diagnosis present

## 2020-09-23 DIAGNOSIS — G936 Cerebral edema: Secondary | ICD-10-CM | POA: Diagnosis present

## 2020-09-23 DIAGNOSIS — D61818 Other pancytopenia: Secondary | ICD-10-CM | POA: Diagnosis present

## 2020-09-23 DIAGNOSIS — G40909 Epilepsy, unspecified, not intractable, without status epilepticus: Secondary | ICD-10-CM | POA: Diagnosis present

## 2020-09-23 DIAGNOSIS — Z20822 Contact with and (suspected) exposure to covid-19: Secondary | ICD-10-CM | POA: Diagnosis present

## 2020-09-23 DIAGNOSIS — C718 Malignant neoplasm of overlapping sites of brain: Secondary | ICD-10-CM

## 2020-09-23 DIAGNOSIS — G9389 Other specified disorders of brain: Secondary | ICD-10-CM

## 2020-09-23 DIAGNOSIS — Z515 Encounter for palliative care: Secondary | ICD-10-CM

## 2020-09-23 DIAGNOSIS — G919 Hydrocephalus, unspecified: Secondary | ICD-10-CM | POA: Diagnosis present

## 2020-09-23 DIAGNOSIS — Z86711 Personal history of pulmonary embolism: Secondary | ICD-10-CM

## 2020-09-23 LAB — COMPREHENSIVE METABOLIC PANEL, NON-FASTING
ALBUMIN: 3.6 g/dL (ref 3.4–4.8)
ALKALINE PHOSPHATASE: 45 U/L — ABNORMAL LOW (ref 55–145)
ALT (SGPT): 27 U/L — ABNORMAL HIGH (ref 8–22)
ANION GAP: 10 mmol/L (ref 4–13)
AST (SGOT): 24 U/L (ref 8–45)
BILIRUBIN TOTAL: 0.8 mg/dL (ref 0.3–1.3)
BUN/CREA RATIO: 11 (ref 6–22)
BUN: 7 mg/dL — ABNORMAL LOW (ref 8–25)
CALCIUM: 8.9 mg/dL (ref 8.8–10.2)
CHLORIDE: 105 mmol/L (ref 96–111)
CO2 TOTAL: 24 mmol/L (ref 23–31)
CREATININE: 0.66 mg/dL (ref 0.60–1.05)
ESTIMATED GFR: >90 mL/min/BSA (ref >=60)
GLUCOSE: 163 mg/dL — ABNORMAL HIGH (ref 65–125)
POTASSIUM: 3.1 mmol/L — ABNORMAL LOW (ref 3.5–5.1)
PROTEIN TOTAL: 6 g/dL (ref 6.0–8.0)
SODIUM: 139 mmol/L (ref 136–145)

## 2020-09-23 LAB — DRUG SCREEN, NO CONFIRMATION, URINE
AMPHETAMINES, URINE: NEGATIVE
BARBITURATES URINE: NEGATIVE
BENZODIAZEPINES URINE: NEGATIVE
BUPRENORPHINE URINE: NEGATIVE
CANNABINOIDS URINE: NEGATIVE
COCAINE METABOLITES URINE: NEGATIVE
CREATININE RANDOM URINE: 38 mg/dL — ABNORMAL LOW (ref 50–100)
ECSTASY/MDMA URINE: NEGATIVE
FENTANYL, RANDOM URINE: NEGATIVE
METHADONE URINE: NEGATIVE
OPIATES URINE (LOW CUTOFF): NEGATIVE
OXYCODONE URINE: NEGATIVE

## 2020-09-23 LAB — CBC WITH DIFF
HCT: 28.8 % — ABNORMAL LOW (ref 34.8–46.0)
HCT: 30.5 % — ABNORMAL LOW (ref 34.8–46.0)
HGB: 9.5 g/dL — ABNORMAL LOW (ref 11.5–16.0)
HGB: 10.1 g/dL — ABNORMAL LOW (ref 11.5–16.0)
MCH: 30.7 pg (ref 26.0–32.0)
MCH: 30.8 pg (ref 26.0–32.0)
MCHC: 33 g/dL (ref 31.0–35.5)
MCHC: 33.1 g/dL (ref 31.0–35.5)
MCV: 93.2 fL (ref 78.0–100.0)
MCV: 93 fL (ref 78.0–100.0)
PLATELETS: 14 10*3/uL — CL (ref 150–400)
PLATELETS: 12 10*3/uL — CL (ref 150–400)
RBC: 3.09 10*6/uL — ABNORMAL LOW (ref 3.85–5.22)
RBC: 3.28 10*6/uL — ABNORMAL LOW (ref 3.85–5.22)
RDW-CV: 18.7 % — ABNORMAL HIGH (ref 11.5–15.5)
RDW-CV: 18.3 % — ABNORMAL HIGH (ref 11.5–15.5)
WBC: 1.7 10*3/uL — ABNORMAL LOW (ref 3.7–11.0)
WBC: 1.1 10*3/uL — ABNORMAL LOW (ref 3.7–11.0)

## 2020-09-23 LAB — URINALYSIS WITH MICROSCOPIC REFLEX IF INDICATED BMC/JMC ONLY
BILIRUBIN: NEGATIVE mg/dL
BLOOD: NEGATIVE mg/dL
GLUCOSE: NEGATIVE mg/dL
KETONES: NEGATIVE mg/dL
LEUKOCYTES: NEGATIVE WBCs/uL
NITRITE: NEGATIVE
PH: 7 (ref <8.0)
PROTEIN: NEGATIVE mg/dL
SPECIFIC GRAVITY: 1.007 (ref <1.022)
UROBILINOGEN: <2 mg/dL (ref <=2.0)

## 2020-09-23 LAB — URINALYSIS, MICROSCOPIC

## 2020-09-23 LAB — ETHANOL, SERUM: ETHANOL: NOT DETECTED

## 2020-09-23 LAB — MANUAL DIFF AND MORPHOLOGY-SYSMEX
BASOPHIL #: <0.1 10*3/uL (ref <=0.20)
BASOPHIL #: <0.1 10*3/uL (ref <=0.20)
BASOPHIL %: 0 %
BASOPHIL %: 0 %
EOSINOPHIL #: <0.1 10*3/uL (ref <=0.50)
EOSINOPHIL #: <0.1 10*3/uL (ref <=0.50)
EOSINOPHIL %: 1 %
EOSINOPHIL %: 0 %
LYMPHOCYTE #: 1.19 10*3/uL (ref 1.00–4.80)
LYMPHOCYTE #: 0.59 10*3/uL — ABNORMAL LOW (ref 1.00–4.80)
LYMPHOCYTE %: 67 %
LYMPHOCYTE %: 54 %
METAMYELOCYTE %: 1 %
MONOCYTE #: 0.1 10*3/uL — ABNORMAL LOW (ref 0.20–1.10)
MONOCYTE #: <0.1 10*3/uL — ABNORMAL LOW (ref 0.20–1.10)
MONOCYTE %: 6 %
MONOCYTE %: 7 %
MYELOCYTE %: 1 %
NEUTROPHIL #: 0.36 10*3/uL — ABNORMAL LOW (ref 1.50–7.70)
NEUTROPHIL #: 0.43 10*3/uL — ABNORMAL LOW (ref 1.50–7.70)
NEUTROPHIL %: 16 %
NEUTROPHIL %: 38 %
NEUTROPHIL BANDS %: 5 %
NEUTROPHIL BANDS %: 1 %
NRBC FROM MANUAL DIFF: 1 per 100 WBC
RBC MORPHOLOGY: NORMAL
REACTIVE LYMPHOCYTE %: 3 %

## 2020-09-23 LAB — TYPE AND SCREEN
ABO/RH(D): B POS
ANTIBODY SCREEN: NEGATIVE

## 2020-09-23 LAB — COVID-19, FLU A/B, RSV RAPID BY PCR
INFLUENZA VIRUS TYPE A: NOT DETECTED
INFLUENZA VIRUS TYPE B: NOT DETECTED
RESPIRATORY SYNCTIAL VIRUS (RSV): NOT DETECTED
SARS-CoV-2: NOT DETECTED

## 2020-09-23 LAB — POC FINGERSTICK GLUCOSE - BMC/JMC (RESULTS)
GLUCOSE, POC: 111 mg/dl — ABNORMAL HIGH (ref 60–100)
GLUCOSE, POC: 150 mg/dl — ABNORMAL HIGH (ref 60–100)

## 2020-09-23 LAB — TROPONIN-I: TROPONIN I: 8 ng/L (ref 7–30)

## 2020-09-23 LAB — MAGNESIUM: MAGNESIUM: 1.9 mg/dL (ref 1.8–2.6)

## 2020-09-23 MED ORDER — DEXAMETHASONE SODIUM PHOSPHATE 4 MG/ML INJECTION SOLUTION
4.0000 mg | Freq: Four times a day (QID) | INTRAMUSCULAR | Status: DC
Start: 2020-09-24 — End: 2020-09-25
  Administered 2020-09-24 – 2020-09-25 (×7): 4 mg via INTRAVENOUS
  Filled 2020-09-23 (×7): qty 1

## 2020-09-23 MED ORDER — POTASSIUM CHLORIDE ER 10 MEQ CAPSULE,EXTENDED RELEASE
40.0000 meq | ORAL_CAPSULE | Freq: Two times a day (BID) | ORAL | Status: AC
Start: 2020-09-23 — End: 2020-09-24
  Administered 2020-09-23 – 2020-09-24 (×3): 40 meq via ORAL
  Filled 2020-09-23 (×2): qty 4

## 2020-09-23 MED ORDER — SODIUM CHLORIDE 0.9 % (FLUSH) INJECTION SYRINGE
10.0000 mL | INJECTION | Freq: Three times a day (TID) | INTRAMUSCULAR | Status: DC
Start: 2020-09-23 — End: 2020-09-25
  Administered 2020-09-23: 10 mL via INTRAVENOUS
  Administered 2020-09-24: 0 mL via INTRAVENOUS
  Administered 2020-09-24: 10 mL via INTRAVENOUS
  Administered 2020-09-24 – 2020-09-25 (×2): 0 mL via INTRAVENOUS
  Administered 2020-09-25: 10 mL via INTRAVENOUS

## 2020-09-23 MED ORDER — LORAZEPAM 2 MG/ML INJECTION SOLUTION
0.5000 mg | Freq: Once | INTRAMUSCULAR | Status: AC | PRN
Start: 2020-09-23 — End: 2020-09-23
  Administered 2020-09-23: 0.5 mg via INTRAVENOUS
  Filled 2020-09-23: qty 1

## 2020-09-23 MED ORDER — INSULIN LISPRO 100 UNIT/ML INJECTION SSIP - CITY
1.0000 [IU] | Freq: Four times a day (QID) | SUBCUTANEOUS | Status: DC
Start: 2020-09-24 — End: 2020-09-25
  Administered 2020-09-24 (×4): 0 [IU] via SUBCUTANEOUS
  Administered 2020-09-25 (×2): 1 [IU] via SUBCUTANEOUS
  Filled 2020-09-23 (×2): qty 300

## 2020-09-23 MED ORDER — SODIUM CHLORIDE 0.9 % IV BOLUS
40.0000 mL | INJECTION | Freq: Once | Status: DC | PRN
Start: 2020-09-23 — End: 2020-09-23

## 2020-09-23 MED ORDER — SODIUM CHLORIDE 0.9 % (FLUSH) INJECTION SYRINGE
10.0000 mL | INJECTION | INTRAMUSCULAR | Status: DC | PRN
Start: 2020-09-23 — End: 2020-09-25

## 2020-09-23 MED ORDER — DEXAMETHASONE SODIUM PHOSPHATE 10 MG/ML INJECTION SOLUTION
10.0000 mg | INTRAMUSCULAR | Status: AC
Start: 2020-09-23 — End: 2020-09-23
  Administered 2020-09-23: 10 mg via INTRAVENOUS
  Filled 2020-09-23: qty 1

## 2020-09-23 MED ORDER — CALCIUM 500 MG (AS CARBONATE)-VITAMIN D3 5 MCG (200 UNIT) TABLET
1.0000 | ORAL_TABLET | Freq: Every day | ORAL | Status: DC
Start: 2020-09-24 — End: 2020-09-25
  Administered 2020-09-24: 1 via ORAL
  Administered 2020-09-25: 0 via ORAL
  Filled 2020-09-23 (×2): qty 1

## 2020-09-23 MED ORDER — ACETAMINOPHEN 325 MG TABLET
650.0000 mg | ORAL_TABLET | Freq: Four times a day (QID) | ORAL | Status: DC | PRN
Start: 2020-09-23 — End: 2020-09-25
  Filled 2020-09-23: qty 2

## 2020-09-23 MED ORDER — ACETAMINOPHEN 1,000 MG/100 ML (10 MG/ML) INTRAVENOUS SOLUTION
1000.0000 mg | Freq: Four times a day (QID) | INTRAVENOUS | Status: AC | PRN
Start: 2020-09-23 — End: 2020-09-24

## 2020-09-23 MED ORDER — AMLODIPINE 5 MG TABLET
10.0000 mg | ORAL_TABLET | Freq: Every day | ORAL | Status: DC
Start: 2020-09-24 — End: 2020-09-25
  Administered 2020-09-24: 10 mg via ORAL
  Administered 2020-09-25: 0 mg via ORAL
  Filled 2020-09-23 (×2): qty 2

## 2020-09-23 MED ORDER — ONDANSETRON HCL (PF) 4 MG/2 ML INJECTION SOLUTION
4.0000 mg | Freq: Four times a day (QID) | INTRAMUSCULAR | Status: DC | PRN
Start: 2020-09-23 — End: 2020-09-25

## 2020-09-23 MED ORDER — GADOTERIDOL 279.3 MG/ML INTRAVENOUS SOLUTION
12.0000 mL | INTRAVENOUS | Status: AC
Start: 2020-09-23 — End: 2020-09-23
  Administered 2020-09-23: 12 mL via INTRAVENOUS
  Filled 2020-09-23: qty 15

## 2020-09-23 MED ORDER — DEXAMETHASONE SODIUM PHOSPHATE 4 MG/ML INJECTION SOLUTION
4.0000 mg | Freq: Four times a day (QID) | INTRAMUSCULAR | Status: AC
Start: 2020-09-23 — End: 2020-09-23
  Administered 2020-09-23: 4 mg via INTRAVENOUS
  Filled 2020-09-23: qty 1

## 2020-09-23 MED ORDER — SODIUM CHLORIDE 0.9% FLUSH BAG - 250 ML
INTRAVENOUS | Status: DC | PRN
Start: 2020-09-23 — End: 2020-09-25

## 2020-09-23 MED ORDER — DEXTROSE 50 % IN WATER (D50W) INTRAVENOUS SYRINGE
12.5000 g | INJECTION | INTRAVENOUS | Status: DC | PRN
Start: 2020-09-23 — End: 2020-09-25

## 2020-09-23 MED ORDER — LEVETIRACETAM 500 MG TABLET
1000.0000 mg | ORAL_TABLET | Freq: Two times a day (BID) | ORAL | Status: DC
Start: 2020-09-23 — End: 2020-09-25
  Administered 2020-09-23: 1000 mg via ORAL
  Administered 2020-09-24: 0 mg via ORAL
  Administered 2020-09-24: 1000 mg via ORAL
  Administered 2020-09-25: 500 mg via ORAL
  Filled 2020-09-23 (×4): qty 2

## 2020-09-23 NOTE — ED Nurses Note (Signed)
One MRI screening was completed on patient, order was changed and prompted second screening per [facility] in MRI second screening does not need to be completed.

## 2020-09-23 NOTE — ED Nurses Note (Signed)
Straight cath patient, sterile technique used. Specimen collected and sent to the lab

## 2020-09-23 NOTE — H&P (Signed)
[facility]  [place], [place] [address]    General History and Physical    Carrie, Escobar  Date of Admission:  09/23/2020   Date of Service:  09/23/2020   Date of Birth:  1946/09/30    PCP: Kayleen Memos, MD  Chief Complaint:  Near syncope      *Caveat: Full HPI unobtainable from patient secondary to confusion, information obtained from pt family and review of EMR  HPI: Carrie Escobar is a 74 y.o., 44 American female with a history of type II DM, HTN, PE (on Eliquis), and GBM who presented to the ED after a near syncope episode that occurred today while on the toilet. EMS reports that they received a call from pts brother who noted pt  "going in and out of consciousness" while sitting on the commode. The patient has a hx of known glioblastoma for which she has completed abbreviated course of radiation along with temodar. Followed by [facility], who she last saw 12/01, and had follow up imaging scheduled for the end of the month. the patient is confused at baseline, no real change in that per family however they did notice that she started dragging her right foot on 12/10, also had been complaining of headaches and had some notable right arm weakness. Family denies any observed syncopal episodes, changes in patients speech, bleeding, hematuria, melena, vomiting, fever, SOB, changes in urination, diarrhea or pt complaints of CP.   An MRI of pts brain today unfortunately shows Significant interval increase in size of the left cerebral hemispheric mass, consistent with glioblastoma., ncreased edema, mass effect and brain herniation with new mild hydrocephalus. Her lab workup is significant for pancytopenia which is new, platelts 14000, Hgb is stable. No evidence of bleeding on head CT. Her uA and CXR are negative for signs of infection. The ED spoke to Dr. Bari Edward  (onc) and Dr. Ebony Hail (neurosurgery) who both said the tumor is inoperable. Vitals have  remained stable. Pt appears comfortabel. She will be admitted to the hospital for further treatment and monitoring     Patient Active Problem List    Diagnosis Date Noted    Vasogenic brain edema (CMS [facility]) 09/23/2020    Multiple pulmonary emboli (CMS [facility]) 07/29/2020    GBM (glioblastoma multiforme) (CMS [facility]) 07/29/2020    Pulmonary infarction (CMS [facility]) 07/29/2020    DM (diabetes mellitus), type 2 (CMS [facility]) 07/29/2020    Brain mass 06/27/2020    Memory impairment 79/39/0300    Acute metabolic encephalopathy 92/33/0076    Trigger finger of right hand 02/10/2016    Atypical chest pain 12/04/2014    Dependent edema 04/08/2014    Routine general medical examination at a health care facility 01/15/2014    Senile nuclear sclerosis 10/03/2013    Rotator cuff syndrome of right shoulder 08/11/2013    Shoulder pain, right 02/26/2013    Lipoma of shoulder 02/26/2013       Past Medical History:   Diagnosis Date    Acute metabolic encephalopathy 22/63/3354    Brain mass 06/29/2020    MRI: Multiple enhancing masses in the left parietal, temporal, and occipital lobes which are connected by masslike T2 FLAIR signal abnormality.  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  Largest mass extends along the ependymal lining of left lateral ventricle.    Breast mass, right     benign    Cancer (CMS HCC)     Cataract     bilat  Diabetes mellitus, type 2 (CMS HCC)     Embolism (CMS HCC)     Family history of colon cancer     Heartburn     HTN (hypertension)     Memory impairment 06/30/2020    Nausea with vomiting     sister with PONV    Obesity     Pulmonary emboli     only once in 1999 after her hysterectomy    Thyroid nodule     left     Wears glasses     reading    Weight loss, unintentional 07/22/2020           Past Surgical History:   Procedure Laterality Date    20550 - INJ SINGLE TENDON SHEATH/LIGAMENT, APONEUROSIS W/ [place] INTERP ONLY (AMB ONLY)  02/10/2016    COLONOSCOPY  2016    HX BREAST  BIOPSY Right     BENIGN     HX CATARACT REMOVAL  10/2013    left    HX COLONOSCOPY      HX HYSTERECTOMY  1999    fibroid    HX ROTATOR CUFF REPAIR  11/04/2013    right shoulder            Medications Prior to Admission     Prescriptions    acetaminophen (TYLENOL EXTRA STRENGTH) 500 mg Oral Tablet    Take 500 mg by mouth Every 4 hours as needed for Pain    amLODIPine (NORVASC) 10 mg Oral Tablet    TAKE 1 TABLET BY MOUTH EVERY DAY    apixaban (ELIQUIS) 5 mg Oral Tablet    Take 1 Tablet (5 mg total) by mouth Twice daily for 30 days    aspirin (ECOTRIN) 81 mg Oral Tablet, Delayed Release (E.C.)    Take 1 Tablet (81 mg total) by mouth Once a day    calcium citrate-vitamin D3 (CITRACAL) 200 mg-6.25 mcg (250 unit) Oral Tablet    Take 1 Tablet by mouth Once a day    dexAMETHasone (DECADRON) 2 mg Oral Tablet    Take 2 mg by mouth Twice daily. Indications: nausea and vomiting caused by cancer drugs    docusate sodium (COLACE) 100 mg Oral Capsule    Take 100 mg by mouth Twice daily. Indications: constipation    Flaxseed Oil 1,000 mg Oral Capsule    Take 1 Capsule by mouth Twice daily  Indications: herbal supplement     levETIRAcetam (KEPPRA) 1,000 mg Oral Tablet    Take 1 Tablet (1,000 mg total) by mouth Twice daily    LORazepam (ATIVAN) 0.5 mg Oral Tablet    Take 0.5 mg by mouth Once a day. Indications: anxious    metFORMIN (GLUCOPHAGE XR) 500 mg Oral Tablet Sustained Release 24 hr    TAKE 1 TABLET (500 MG TOTAL) BY MOUTH EVERY DAY.    multivitamin Oral Tablet    Take 1 Tab by mouth Once a day    ondansetron (ZOFRAN ODT) 8 mg Oral Tablet, Rapid Dissolve    1 Tablet (8 mg total) by Sublingual route Every 8 hours as needed for Nausea/Vomiting for up to 90 days    polyethylene glycol (MIRALAX) 17 gram Oral Powder in Packet    Take 17 g by mouth 2X/day. Indications: constipation    temozolomide (TEMODAR) 100 mg Oral Capsule    Take 1 Capsule (100 mg total) by mouth Once a day for 14 days with 2 other temozolomide  prescriptions for 130 mg total  Patient not taking:  Reported on 08/31/2020    temozolomide (TEMODAR) 20 mg Oral Capsule    Take 1 Capsule (20 mg total) by mouth Once a day for 14 days with 2 other temozolomide prescriptions for 130 mg total    Patient not taking:  Reported on 08/31/2020    temozolomide (TEMODAR) 5 mg Oral Capsule    Take 2 Capsules (10 mg total) by mouth Once a day for 14 days with 2 other temozolomide prescriptions for 130 mg total    Patient not taking:  Reported on 08/31/2020    timolol maleate (TIMOPTIC) 0.25 % Ophthalmic Drops    Instill 1 Drop into both eyes Twice daily      traMADoL (ULTRAM) 50 mg Oral Tablet    Take 1 Tablet (50 mg total) by mouth Every 6 hours as needed    Patient not taking:  Reported on 08/19/2020     trimethoprim-sulfamethoxazole (BACTRIM DS) 160-800mg  per tablet    Take 1 Tablet (160 mg total) by mouth Every Monday, Wednesday and Friday for 18 doses    Patient taking differently:  Take 1 Tablet by mouth Every Monday, Wednesday and Friday. Indications: urinary tract infection prevention    wheat dextrin (BENEFIBER CLEAR SF, DEXTRIN, ORAL)    Take 2 teaspoons by mouth 2X/day. Indications: constipation    Patient not taking:  Reported on 09/09/2020         dexamethasone 4 mg/mL injection, 4 mg, Intravenous, Q6H  NS bolus infusion 40 mL, 40 mL, Intravenous, Once PRN        Allergies   Allergen Reactions    Amoxicillin Rash       Social History     Tobacco Use    Smoking status: Never Smoker    Smokeless tobacco: Never Used   Substance Use Topics    Alcohol use: No     Alcohol/week: 0.0 standard drinks     Reviewed on admission   Family Medical History:     Problem Relation (Age of Onset)    Breast Cancer Maternal Grandmother    Cancer Mother, Sister    Diabetes Sister    Heart Attack Sister    Hypertension (High Blood Pressure) Mother, Sister    No Known Problems Brother, Maternal Grandfather, Paternal Grandmother, Paternal 65, Daughter, Son, Maternal 82,  Maternal Uncle, Paternal 60, Paternal Uncle, Other    Stroke Mother, Father            ROS: Other than ROS in the HPI, all other systems were negative.    EXAM:  Temperature: 36.8 C (98.2 F)  Heart Rate: 71  BP (Non-Invasive): 125/70  Respiratory Rate: 18  SpO2: 100 %  General: no acute distress, resting comfortably    Eyes: Pupils equal and round, reactive to light. Anicteric  HEENT: Head atraumatic and normocephalic, MMM  Neck: No JVD or thyromegaly or lymphadenopathy   Lungs: No respiratory distress. Lungs CTAB. No w/r/r.   Cardiovascular: normal rate, regular rhythm, S1, S2 normal, no murmur appreciated  Abdomen: Soft, non-tender, non-distended, bowel sounds normal  Extremities: extremities normal, atraumatic, no cyanosis or edema. DP pulses 2+ and equal bilaterally.   Skin: Skin warm and dry   Neurologic: Alert, oriented, no focal deficit, CN II-XII grossly intact  Lymphatics: No lymphadenopathy   Psychiatric: Normal affect, behavior       Labs:    I have reviewed all lab results.  Lab Results for Last 24 Hours:    Results for orders  placed or performed during the hospital encounter of 09/23/20 (from the past 24 hour(s))   COMPREHENSIVE METABOLIC PANEL, NON-FASTING   Result Value Ref Range    SODIUM 139 136 - 145 mmol/L    POTASSIUM 3.1 (L) 3.5 - 5.1 mmol/L    CHLORIDE 105 96 - 111 mmol/L    CO2 TOTAL 24 23 - 31 mmol/L    ANION GAP 10 4 - 13 mmol/L    BUN 7 (L) 8 - 25 mg/dL    CREATININE 0.66 0.60 - 1.05 mg/dL    BUN/CREA RATIO 11 6 - 22    ESTIMATED GFR >90 >=60 mL/min/BSA    ALBUMIN 3.6 3.4 - 4.8 g/dL     CALCIUM 8.9 8.8 - 10.2 mg/dL    GLUCOSE 163 (H) 65 - 125 mg/dL    ALKALINE PHOSPHATASE 45 (L) 55 - 145 U/L    ALT (SGPT) 27 (H) 8 - 22 U/L    AST (SGOT)  24 8 - 45 U/L    BILIRUBIN TOTAL 0.8 0.3 - 1.3 mg/dL    PROTEIN TOTAL 6.0 6.0 - 8.0 g/dL   ETHANOL, SERUM   Result Value Ref Range    ETHANOL None Detected    TROPONIN-I   Result Value Ref Range    TROPONIN I 8 7 - 30 ng/L   CBC WITH DIFF   Result Value  Ref Range    WBC 1.7 (L) 3.7 - 11.0 x103/uL    RBC 3.09 (L) 3.85 - 5.22 x106/uL    HGB 9.5 (L) 11.5 - 16.0 g/dL    HCT 28.8 (L) 34.8 - 46.0 %    MCV 93.2 78.0 - 100.0 fL    MCH 30.7 26.0 - 32.0 pg    MCHC 33.0 31.0 - 35.5 g/dL    RDW-CV 18.7 (H) 11.5 - 15.5 %    PLATELETS 14 (LL) 150 - 400 x103/uL   MANUAL DIFF AND MORPHOLOGY-SYSMEX   Result Value Ref Range    NEUTROPHIL % 16 %    LYMPHOCYTE %  67 %    MONOCYTE % 6 %    EOSINOPHIL % 1 %    BASOPHIL % 0 %    NEUTROPHIL BANDS % 5 %    METAMYELOCYTE %  1 %    MYELOCYTE % 1 %    REACTIVE LYMPHOCYTE % 3 %    NEUTROPHIL # 0.36 (L) 1.50 - 7.70 x103/uL    LYMPHOCYTE # 1.19 1.00 - 4.80 x103/uL    MONOCYTE # 0.10 (L) 0.20 - 1.10 x103/uL    EOSINOPHIL # <0.10 <=0.50 x103/uL    BASOPHIL # <0.10 <=0.20 x103/uL    NRBC FROM MANUAL DIFF 1 per 100 WBC   COVID-19, FLU A/B, RSV RAPID BY PCR   Result Value Ref Range    SARS-CoV-2 Not Detected Not Detected    INFLUENZA VIRUS TYPE A Not Detected Not Detected    INFLUENZA VIRUS TYPE B Not Detected Not Detected    RESPIRATORY SYNCTIAL VIRUS (RSV) Not Detected Not Detected   DRUG SCREEN, NO CONFIRMATION, URINE   Result Value Ref Range    AMPHETAMINES, URINE Negative Negative    BARBITURATES URINE Negative Negative    BENZODIAZEPINES URINE Negative Negative    BUPRENORPHINE URINE Negative Negative    CANNABINOIDS URINE Negative Negative    COCAINE METABOLITES URINE Negative Negative    METHADONE URINE Negative Negative    OPIATES URINE (LOW CUTOFF) Negative Negative    OXYCODONE URINE Negative Negative  ECSTASY/MDMA URINE Negative Negative    FENTANYL, RANDOM URINE Negative Negative    CREATININE RANDOM URINE 38 (L) 50 - 100 mg/dL   URINALYSIS WITH MICROSCOPIC REFLEX IF INDICATED [facility] ONLY   Result Value Ref Range    COLOR Light Yellow Light Yellow, Straw, Yellow    APPEARANCE Slightly Cloudy (A) Clear    PH 7.0 <8.0    LEUKOCYTES Negative Negative WBCs/uL    NITRITE Negative Negative    PROTEIN Negative Negative, 10  mg/dL     GLUCOSE Negative Negative mg/dL    KETONES Negative Negative mg/dL    UROBILINOGEN < 2.0 <=2.0 mg/dL    BILIRUBIN Negative Negative mg/dL    BLOOD Negative Negative mg/dL    SPECIFIC GRAVITY 1.007 <1.022   POC FINGERSTICK GLUCOSE - [facility] (RESULTS)   Result Value Ref Range    GLUCOSE, POC 111 (H) 60 - 100 mg/dl   URINALYSIS, MICROSCOPIC   Result Value Ref Range    RBCS 0-2 0 - 2 /hpf    WBCS 0-2 0 - 2 /hpf    BACTERIA Slight (A) None /hpf    SQUAMOUS EPITHELIAL None 0 - 2 /hpf    TRANSITIONAL EPITHELIAL 0-2 0 - 2 /hpf    MUCOUS Slight (A) None, Light /hpf    AMORPHOUS SEDIMENT Slight (A) None /hpf    HYALINE CASTS 2-5 (A) 0 - 2 /lpf    GRANULAR CASTS 0-2 (A) None /lpf   TYPE AND SCREEN   Result Value Ref Range    UNITS ORDERED NOT STATED         ABO/RH(D) B POSITIVE     ANTIBODY SCREEN NEGATIVE     SPECIMEN EXPIRATION DATE 09/26/2020    PRODUCT: PLATELETS - UNITS , 1 Units   Result Value Ref Range    Coding System [id number]     UNIT NUMBER [id number]     BLOOD COMPONENT TYPE PATHOGEN REDUCED PLASMA REDUCED1     UNIT DIVISION 00     UNIT DISPENSE STATUS ALLOCATED     TRANSFUSION STATUS OK TO TRANSFUSE     Product Code [id number]        Imaging Studies:      MRI BRAIN W/WO CONTRAST  09/23/2020 4:20 PM     REASON FOR EXAM:  latered mental status, right sided weakness, hx of mass    INTRAVENOUS CONTRAST:  13 ml of Prohance    TECHNIQUE: Multiplanar multisequence MRI of the brain was performed with and without contrast    COMPARISON: MRI brain 06/27/2020.   CT brain 07/03/2020.    FINDINGS: There is significant interval increase in size of a large, heterogeneously enhancing mass in the left lateral hemisphere, now measuring 10 x 4.8 x 4.7 cm. This is centered in the left temporal, parietal and occipital periventricular region and extends along the ependymal surface of the ventricle. There is marked peritumoral T2/FLAIR signal hyperintensity in the left cerebral hemisphere with new left uncal and transtentorial  herniation causing effacement of the left perimesencephalic cistern. Multiple small foci of signal dropout are identified within the tumor on the blood sensitive sequence, representing internal blood products. Small ring-enhancing components of the mass are noted in the left parieto-occipital region. Small focus of enhancement along the left falx is stable. There is significant effacement of the left lateral ventricle body, atrium, occipital and temporal horns with mild CSF trapping in the anterior portion of the left temporal horn. Mild dilation of the right lateral ventricle is also now  seen due to CSF trapping. There is rightward midline shift measuring 7 mm. There is no cerebellar tonsillar ectopia.     Major intracranial vascular flow-voids are intact.     There is no fluid signal abnormality in the paranasal sinuses or mastoid air cells.     IMPRESSION:    Significant interval increase in size of the left cerebral hemispheric mass, consistent with glioblastoma. Increased edema, mass effect and brain herniation with new mild hydrocephalus.    Findings were discussed with Dr. Truman Hayward of emergency medicine by Dr. Murvin Natal via telephone on 09/23/2020 at 4:45 PM.        Assessment/Plan:   Carrie Escobar is a 74 y.o., 75 American female who presents with the following:    1. Glioblastoma, now with increased edema, mass effect and brain herniation with mild hydrocephalus   2. Pancytopenia   --dx in October, has completed abbreviated course of radiation along with temodar. Followed by [facility], who she last saw 12/01, had interval imaging scheduled for end of this month   --neurosurgery and oncology consulted and aware of pt case. They both feel tumor is inoperable    --will treat with 4mg  decadron Q6 hours for now  --transfuse 1 unit platelets per oncology, serial CBC. Hgb has fortunately remained stable and no eidence of acute bleeding at this point  --Family was informed. They wish for her to  remain full code but are open to discussion of hospice. Will place palliative care consult      3. PE  --dx in 07/2020  --Holding Eliquis     4. Controlled Type II DM  --A1C 6.4. on only metformin  --hold in setting of recent contrast administration. Low dose ssi with Humalog if indicated    5. Seizure Disorder  --no seizure activity noted, continue home Keppra 1000 mg BID    6. Thyroid nodule  --[place] on 09/20 shows a complex nodule in the left thyroid lobe measuring 15 x 12 x 13 mm.  --IR US guided thyroid Biopsy was planned but more recent PE and the need foruninterrupted anticoagulation postponed the procedure  --recent tsh levels wdl    DVT PPx: contraindicated in setting of severe thrombocytopenia   Code Status:  Full Code, confirmed with family pending palliative care consult     Brenton Grills, DNP,FNP-C       Portions of this note may be dictated using voice recognition software or a dictation service. Variances in spelling and vocabulary are possible and unintentional. Not all errors are caught/corrected. Please notify the [facility] if any discrepancies are noted or if the meaning of any statement is not clear.

## 2020-09-23 NOTE — ED Nurses Note (Signed)
MRI screening completed, MRI notified of the same.

## 2020-09-23 NOTE — ED Nurses Note (Signed)
Report received from [facility] assuming care of pt at this time.

## 2020-09-23 NOTE — Nurses Notes (Signed)
Patient is not able to answer admission questions at this time due to her disorientation. [name], patient's brother and medical decision maker, was called and a voice mail was left on his phone [phone] to have him call back to complete the patient's admission.

## 2020-09-23 NOTE — ED Triage Notes (Signed)
Per EMA Timeout: Pt had near syncopal episodes while on the commode, has a brain tumor, not receiving tx for same, FSBS=174, has been lethargic,has right sided weakness since Friday, EMS report grips strong, equal to BUE'S, can raise & lower BLE'S, pt lives with her brother whom is her caregiver, reports pt is normally confused. IV # 20 ga to [place] via EMS with rainbow set of blood drawn. Pt alert on arrival to E.D. incontinent of very large amount of BM.

## 2020-09-23 NOTE — ED Provider Notes (Signed)
[facility]  Emergency Department Visit Note    Date:  09/23/2020  Primary care provider:  Kayleen Memos, MD  Means of arrival:  ambulance  History obtained from: relative(s) and EMS  History limited by: confusion    Chief Complaint:  Near syncope episode    HISTORY OF PRESENT ILLNESS     Carrie Escobar, date of birth 1946/03/27, is a 74 y.o. female who presents to the Emergency Department complaining of near syncope episode while on the commode. EMS reports that they received a call for patient "going in and out of consciousness" while sitting on the commode per patient's brother. Patient has history of brain tumor and is currently not undergoing chemotherapy. EMS note that brother confirmed patient is confused at baseline, but has had a new onset of right extremity weakness onset Friday (09/18/20). Patient resides with brother who is her caretaker.     Caveat: Full history of present illness is unobtainable from patient secondary to confusion.    REVIEW OF SYSTEMS     Caveat: Full review of systems is unobtainable from patient secondary to confusion.     PATIENT HISTORY     Past Medical History:  Past Medical History:   Diagnosis Date    Acute metabolic encephalopathy 50/06/3817    Brain mass 06/29/2020    MRI: Multiple enhancing masses in the left parietal, temporal, and occipital lobes which are connected by masslike T2 FLAIR signal abnormality.  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  Largest mass extends along the ependymal lining of left lateral ventricle.    Breast mass, right     benign    Cancer (CMS HCC)     Cataract     bilat    Diabetes mellitus, type 2 (CMS HCC)     Embolism (CMS HCC)     Family history of colon cancer     Heartburn     HTN (hypertension)     Memory impairment 06/30/2020    Nausea with vomiting     sister with PONV    Obesity     Pulmonary emboli     only once in 1999 after her hysterectomy    Thyroid nodule     left      Wears glasses     reading    Weight loss, unintentional 07/22/2020       Past Surgical History:  Past Surgical History:   Procedure Laterality Date    20550 - inj single tendon sheath/ligament, aponeurosis w/ [place] interp only (amb only)  02/10/2016    Colonoscopy  2016    Hx breast biopsy Right     Hx cataract removal  10/2013    Hx colonoscopy      Hx hysterectomy  1999    Hx rotator cuff repair  11/04/2013       Family History:  Family Medical History:     Problem Relation (Age of Onset)    Breast Cancer Maternal Grandmother    Cancer Mother, Sister    Diabetes Sister    Heart Attack Sister    Hypertension (High Blood Pressure) Mother, Sister    No Known Problems Brother, Maternal Grandfather, Paternal Grandmother, Paternal 16, Daughter, Son, Maternal Aunt, Maternal Uncle, Paternal Aunt, Paternal Uncle, Other    Stroke Mother, Father            Social History:  Social History     Tobacco Use    Smoking status: Never  Smoker    Smokeless tobacco: Never Used   Vaping Use    Vaping Use: Never used   Substance Use Topics    Alcohol use: No     Alcohol/week: 0.0 standard drinks    Drug use: No     Social History     Substance and Sexual Activity   Drug Use No       Medications:    Current Outpatient Medications   Medication Sig    acetaminophen (TYLENOL EXTRA STRENGTH) 500 mg Oral Tablet Take 500 mg by mouth Every 4 hours as needed for Pain    amLODIPine (NORVASC) 10 mg Oral Tablet TAKE 1 TABLET BY MOUTH EVERY DAY    aspirin (ECOTRIN) 81 mg Oral Tablet, Delayed Release (E.C.) Take 1 Tablet (81 mg total) by mouth Once a day    calcium citrate-vitamin D3 (CITRACAL) 200 mg-6.25 mcg (250 unit) Oral Tablet Take 1 Tablet by mouth Once a day (Patient not taking: Reported on 08/05/2020 )    dexAMETHasone (DECADRON) 2 mg Oral Tablet Take 2 mg by mouth Twice daily. Indications: nausea and vomiting caused by cancer drugs    docusate sodium (COLACE) 100 mg Oral Capsule Take 100 mg by mouth Twice daily.  Indications: constipation    Flaxseed Oil 1,000 mg Oral Capsule Take 1 Capsule by mouth Twice daily  Indications: herbal supplement     fluticasone propionate (FLONASE) 50 mcg/actuation Nasal Spray, Suspension 1 Spray by Each Nostril route Twice per day as needed (Patient taking differently: 1 Spray by Each Nostril route Twice per day as needed (allergy symptoms). Indications: inflammation of the nose due to an allergy)    levETIRAcetam (KEPPRA) 1,000 mg Oral Tablet Take 1 Tablet (1,000 mg total) by mouth Twice daily    LORazepam (ATIVAN) 0.5 mg Oral Tablet Take 0.5 mg by mouth Once a day. Indications: anxious    metFORMIN (GLUCOPHAGE XR) 500 mg Oral Tablet Sustained Release 24 hr TAKE 1 TABLET (500 MG TOTAL) BY MOUTH EVERY DAY.    multivitamin Oral Tablet Take 1 Tab by mouth Once a day    ondansetron (ZOFRAN ODT) 8 mg Oral Tablet, Rapid Dissolve 1 Tablet (8 mg total) by Sublingual route Every 8 hours as needed for Nausea/Vomiting for up to 90 days    polyethylene glycol (MIRALAX) 17 gram Oral Powder in Packet Take 17 g by mouth 2X/day. Indications: constipation    timolol maleate (TIMOPTIC) 0.25 % Ophthalmic Drops Instill 1 Drop into both eyes Twice daily      traMADoL (ULTRAM) 50 mg Oral Tablet Take 1 Tablet (50 mg total) by mouth Every 6 hours as needed (Patient not taking: Reported on 08/19/2020 )    wheat dextrin (BENEFIBER CLEAR SF, DEXTRIN, ORAL) Take 2 teaspoons by mouth 2X/day. Indications: constipation (Patient not taking: Reported on 09/09/2020 )         Allergies:  Allergies   Allergen Reactions    Amoxicillin Rash       PHYSICAL EXAM     Vitals:  Filed Vitals:    09/23/20 1239   BP: 128/65   Pulse: 51   Resp: 15   Temp: 36.8 C (98.2 F)   SpO2: 99%     Pulse ox  99% on None (Room Air) interpreted by me as: Normal    General: Awake. Alert. no acute distress.  Head:  Atraumatic     Eyes: Anicteric sclera, noninjected conjunctiva.  PERRL. EOMI.  ENT:  surgical mask in place, no stridor.  Neck: Neck is supple, nontender, no adenopathy. No nuchal rigidity.   Lungs: Clear to auscultation bilaterally. No wheezes, rales or rhonchi.  no respiratory distress.  Cardiovascular:  Heart is regular without murmurs rubs or gallops   Abdomen:  Soft, nontender, non-distended, normoactive bowel sounds. No peritoneal signs. No pulsatile mass.  Extremities:  No cyanosis, edema, tenderness or asymmetry.  Skin: Skin warm, dry and well-perfused. No petechia or erythema.   Neurologic: Awake, alert, no lethargy, or somnolence. She is confused and does not follow commands consistently. There is no asymmetry.  No pronator drift.  She could not understand instructions for finger to nose or lifting her legs.   Psychiatric: no agitation.     DIAGNOSTIC STUDIES     Labs:    Results for orders placed or performed during the hospital encounter of 09/23/20   COMPREHENSIVE METABOLIC PANEL, NON-FASTING   Result Value Ref Range    SODIUM 139 136 - 145 mmol/L    POTASSIUM 3.1 (L) 3.5 - 5.1 mmol/L    CHLORIDE 105 96 - 111 mmol/L    CO2 TOTAL 24 23 - 31 mmol/L    ANION GAP 10 4 - 13 mmol/L    BUN 7 (L) 8 - 25 mg/dL    CREATININE 0.66 0.60 - 1.05 mg/dL    BUN/CREA RATIO 11 6 - 22    ESTIMATED GFR >90 >=60 mL/min/BSA    ALBUMIN 3.6 3.4 - 4.8 g/dL     CALCIUM 8.9 8.8 - 10.2 mg/dL    GLUCOSE 163 (H) 65 - 125 mg/dL    ALKALINE PHOSPHATASE 45 (L) 55 - 145 U/L    ALT (SGPT) 27 (H) 8 - 22 U/L    AST (SGOT)  24 8 - 45 U/L    BILIRUBIN TOTAL 0.8 0.3 - 1.3 mg/dL    PROTEIN TOTAL 6.0 6.0 - 8.0 g/dL   DRUG SCREEN, NO CONFIRMATION, URINE   Result Value Ref Range    AMPHETAMINES, URINE Negative Negative    BARBITURATES URINE Negative Negative    BENZODIAZEPINES URINE Negative Negative    BUPRENORPHINE URINE Negative Negative    CANNABINOIDS URINE Negative Negative    COCAINE METABOLITES URINE Negative Negative    METHADONE URINE Negative Negative    OPIATES URINE (LOW CUTOFF) Negative Negative    OXYCODONE URINE Negative Negative    ECSTASY/MDMA  URINE Negative Negative    FENTANYL, RANDOM URINE Negative Negative    CREATININE RANDOM URINE 38 (L) 50 - 100 mg/dL   ETHANOL, SERUM   Result Value Ref Range    ETHANOL None Detected    TROPONIN-I   Result Value Ref Range    TROPONIN I 8 7 - 30 ng/L   COVID-19, FLU A/B, RSV RAPID BY PCR   Result Value Ref Range    SARS-CoV-2 Not Detected Not Detected    INFLUENZA VIRUS TYPE A Not Detected Not Detected    INFLUENZA VIRUS TYPE B Not Detected Not Detected    RESPIRATORY SYNCTIAL VIRUS (RSV) Not Detected Not Detected   URINALYSIS WITH MICROSCOPIC REFLEX IF INDICATED [facility] ONLY   Result Value Ref Range    COLOR Light Yellow Light Yellow, Straw, Yellow    APPEARANCE Slightly Cloudy (A) Clear    PH 7.0 <8.0    LEUKOCYTES Negative Negative WBCs/uL    NITRITE Negative Negative    PROTEIN Negative Negative, 10  mg/dL    GLUCOSE Negative Negative mg/dL    KETONES Negative Negative mg/dL  UROBILINOGEN < 2.0 <=2.0 mg/dL    BILIRUBIN Negative Negative mg/dL    BLOOD Negative Negative mg/dL    SPECIFIC GRAVITY 1.007 <1.022   CBC WITH DIFF   Result Value Ref Range    WBC 1.7 (L) 3.7 - 11.0 x103/uL    RBC 3.09 (L) 3.85 - 5.22 x106/uL    HGB 9.5 (L) 11.5 - 16.0 g/dL    HCT 28.8 (L) 34.8 - 46.0 %    MCV 93.2 78.0 - 100.0 fL    MCH 30.7 26.0 - 32.0 pg    MCHC 33.0 31.0 - 35.5 g/dL    RDW-CV 18.7 (H) 11.5 - 15.5 %    PLATELETS 14 (LL) 150 - 400 x103/uL   MANUAL DIFF AND MORPHOLOGY-SYSMEX   Result Value Ref Range    NEUTROPHIL % 16 %    LYMPHOCYTE %  67 %    MONOCYTE % 6 %    EOSINOPHIL % 1 %    BASOPHIL % 0 %    NEUTROPHIL BANDS % 5 %    METAMYELOCYTE %  1 %    MYELOCYTE % 1 %    REACTIVE LYMPHOCYTE % 3 %    NEUTROPHIL # 0.36 (L) 1.50 - 7.70 x103/uL    LYMPHOCYTE # 1.19 1.00 - 4.80 x103/uL    MONOCYTE # 0.10 (L) 0.20 - 1.10 x103/uL    EOSINOPHIL # <0.10 <=0.50 x103/uL    BASOPHIL # <0.10 <=0.20 x103/uL    NRBC FROM MANUAL DIFF 1 per 100 WBC   URINALYSIS, MICROSCOPIC   Result Value Ref Range    RBCS 0-2 0 - 2 /hpf    WBCS  0-2 0 - 2 /hpf    BACTERIA Slight (A) None /hpf    SQUAMOUS EPITHELIAL None 0 - 2 /hpf    TRANSITIONAL EPITHELIAL 0-2 0 - 2 /hpf    MUCOUS Slight (A) None, Light /hpf    AMORPHOUS SEDIMENT Slight (A) None /hpf    HYALINE CASTS 2-5 (A) 0 - 2 /lpf    GRANULAR CASTS 0-2 (A) None /lpf   POC FINGERSTICK GLUCOSE - [facility] (RESULTS)   Result Value Ref Range    GLUCOSE, POC 111 (H) 60 - 100 mg/dl     Labs reviewed and interpreted by me.    Radiology:    XR AP MOBILE CHEST   Final Result   As above.         Radiologist location ID: [id number]      Radiological imaging interpreted by radiologist and independently reviewed by me.     MRI BRAIN W/WO CONTRAST    (Results Pending)     MRI BRAIN W/WO CONTRAST    Pending at time of sign-put. Dr Truman Hayward will follow.     EKG:  12 lead EKG interpreted by me shows normal sinus rhythm, rate of 65 bpm, base line artifact, normal axis, normal interval, no acute ischemic changes.     ED PROGRESS NOTE / MEDICAL DECISION MAKING     Old records reviewed by me:  The problem list, past medical, past surgical, medication and allergy history reviewed.     Diagnosed with pulmonary embolism July 29 2020.    Orders Placed This Encounter    XR AP MOBILE CHEST    MRI BRAIN W/WO CONTRAST    COMPREHENSIVE METABOLIC PANEL, NON-FASTING    DRUG SCREEN, NO CONFIRMATION, URINE    ETHANOL, SERUM    TROPONIN-I    COVID-19, FLU A/B, RSV RAPID BY  PCR    URINALYSIS WITH MICROSCOPIC REFLEX IF INDICATED [facility] ONLY    CBC WITH DIFF    MANUAL DIFF AND MORPHOLOGY-SYSMEX    URINALYSIS, MICROSCOPIC    ECG 12-LEAD    POCT FINGERSTICK GLUCOSE    LORazepam (ATIVAN) 2 mg/mL injection          12:50: Initial evaluation is complete at this time. I discussed with the patient that I would consult with oncology to further evaluate. Patient is agreeable with the treatment plan at this time.     13:04: Dr. Barbaraann Rondo (Oncology)     13:15: I discussed the patient's case and above findings with Dr. Barbaraann Rondo (Oncology) who  states that she has spoke with Dr. Oletta Darter (Oncology) who recommends ordering a MRI with [facility] imaging because they state this is not the patient's baseline.     13:24: I spoke with MRI tech who states that in previous imaging it looks like patient has an external cardiac monitor that has since been removed.     13:34: On recheck, patient's brother is now present at the bedside. He states that he was away last week and his sister was watching the patient who told him that the patient was complaining of headache last week and noticed she was dragging right foot when walking and that she was able to move her right arm but has no fine coordination of right hand to feed herself. Brother states that she does not have a living will and to treat her as a full code.    15:04 : The care of this patient will be signed out to Dr. Truman Hayward (Emergency Medicine) at this time. This provider will follow up on any ancillary studies and provide any further treatment and management of the patient and ensure proper disposition. Patient is awaiting MRI.     MIPS      Not applicable     OPIATE PRESCRIPTION       Not applicable    CORE MEASURES      Not applicable    CRITICAL CARE TIME      Not applicable     PRE-DISPOSITION VITALS      Pre-Disposition Vitals:  Filed Vitals:    09/23/20 1300 09/23/20 1315 09/23/20 1330 09/23/20 1345   BP: 130/62 132/68 123/62 131/66   Pulse: 52 53 55 65   Resp: 13 14 15 17    Temp:       SpO2: 98% 100% 100% 100%     CLINICAL IMPRESSION     Encounter Diagnosis   Name Primary?    GBM (glioblastoma multiforme) (CMS [place]) Yes        DISPOSITION/PLAN     Pending      SCRIBE ATTESTATION STATEMENT  I Arcola Jansky, SCRIBE scribed for Johna Sheriff DO on 09/23/2020 at 12:41 PM.     Documentation assistance provided for Johna Sheriff DO by Ar[place]a Jansky, [place]. Information recorded by the scribe was done at my direction and has been reviewed and validated by me, Geoffery Lyons, Ramesh Moan DO.

## 2020-09-23 NOTE — ED Attending Handoff Note (Signed)
Altamease Oiler, MD  [facility]  Emergency Department Visit Note    ED PROGRESS NOTE / MEDICAL DECISION MAKING     1500: Care of this patient was transferred to me by Dr. Geoffery Lyons (Emergency Medicine). Please see their note for a full H&P. The patient is awaiting disposition after results of the CT scan and MRI of the head.     1650: I paged Neurosurgery and Oncology    1652: I went into the room and spoke to the patients husband who informed me of why he brought the patient into the ED and that the results came back showing a bulging area of her brain.    1655: I discussed the patient's case and above findings with Dr. Barbaraann Rondo (Oncology) who agrees with platelet transfusion.    1657: I discussed the patient's case and above findings with Dr. Ebony Hail (Neurosurgery) who is going to come down and see the patient and wants to give the patient Dexamethazone.    1805:  Both Dr. Ebony Hail and Dr. Barbaraann Rondo were down to see the patient.  The both agreed that the patient was not a surgical candidate it, and this is likely terminal and would require a hospice consult.  The son in the room is aware of this, and agreement with this, as the patient has now maxed out on radiation therapy, and the son is agreeable to the hospice consult, but would like the patient to be admitted for 1 day, which I thought that this was reasonable.  The patient will be placed on maintenance dexamethasone after the loading dose, at 4 mg q.6.  I spoke to Dr. Bari Edward and Dr. Ebony Hail who both said the tumor is inoperable. I told the family this and that they should look into hospice care, they agree with this but would like to try IV antibiotics.     1820: I discussed the patient's case and above findings with Dr. Grayland Ormond [facility]) who is agreeable to the treatment plan and is arranging for admission at this time.     Pre-Disposition Vitals:  Filed Vitals:    09/23/20 1730 09/23/20 1745 09/23/20 1800 09/23/20 1815   BP: (!) 127/98 137/74  126/76 125/70   Pulse: 64 64 73 71   Resp: 14 15 16 18    Temp:       SpO2: 100% 100% 100% 100%       CLINICAL IMPRESSION     1. Worsening edema secondary to the glioblastoma, with impending uncal herniation  2. Thrombocytopenia, severe  3. Leukopenia  4. Hypokalemia    DISPOSITION/PLAN     Admitted      Prescriptions:  New Prescriptions    No medications on file         Follow Up:  No follow-up provider specified.      Condition on Disposition:  Serious    I, Kelby Aline, SCRIBE, scribed for Altamease Oiler, MD on 09/23/2020 at 4:50 PM.    Documentation assistance provided for Altamease Oiler, MD by scribe Kelby Aline, [place]. Information recorded by the scribe was done at my direction and has been reviewed and validated by me, Truman Hayward Pollyann Savoy, MD.

## 2020-09-23 NOTE — ED Nurses Note (Signed)
Report called to [facility] RN med-surg 4N:  Reviewed pt ED coarse, med hx, allergies, assessment, meds given, diagnostics, pt response to care, v/s.  ?'s answered, verbalized understanding of same. Cond.stable.

## 2020-09-23 NOTE — ED Nurses Note (Signed)
Brother at bedside 

## 2020-09-23 NOTE — Nurses Notes (Signed)
Hospitalist informed that patient needs blood consent prior to platelet transfusion.

## 2020-09-23 NOTE — ED Nurses Note (Signed)
Patient returned from MRI and CT placed back on continuous monitoring.

## 2020-09-24 ENCOUNTER — Inpatient Hospital Stay (HOSPITAL_COMMUNITY): Payer: Medicare PPO

## 2020-09-24 DIAGNOSIS — G936 Cerebral edema: Secondary | ICD-10-CM

## 2020-09-24 DIAGNOSIS — D696 Thrombocytopenia, unspecified: Secondary | ICD-10-CM

## 2020-09-24 DIAGNOSIS — G9389 Other specified disorders of brain: Secondary | ICD-10-CM

## 2020-09-24 DIAGNOSIS — Z79899 Other long term (current) drug therapy: Secondary | ICD-10-CM

## 2020-09-24 DIAGNOSIS — Z9221 Personal history of antineoplastic chemotherapy: Secondary | ICD-10-CM

## 2020-09-24 DIAGNOSIS — R627 Adult failure to thrive: Secondary | ICD-10-CM

## 2020-09-24 DIAGNOSIS — G919 Hydrocephalus, unspecified: Secondary | ICD-10-CM

## 2020-09-24 DIAGNOSIS — C718 Malignant neoplasm of overlapping sites of brain: Secondary | ICD-10-CM

## 2020-09-24 DIAGNOSIS — G935 Compression of brain: Principal | ICD-10-CM

## 2020-09-24 DIAGNOSIS — C719 Malignant neoplasm of brain, unspecified: Secondary | ICD-10-CM

## 2020-09-24 LAB — COMPREHENSIVE METABOLIC PANEL, NON-FASTING
ALBUMIN: 3.5 g/dL (ref 3.4–4.8)
ALKALINE PHOSPHATASE: 46 U/L — ABNORMAL LOW (ref 55–145)
ALT (SGPT): 33 U/L — ABNORMAL HIGH (ref 8–22)
ANION GAP: 8 mmol/L (ref 4–13)
AST (SGOT): 26 U/L (ref 8–45)
BILIRUBIN TOTAL: 1 mg/dL (ref 0.3–1.3)
BUN/CREA RATIO: 15 (ref 6–22)
BUN: 9 mg/dL (ref 8–25)
CALCIUM: 9 mg/dL (ref 8.8–10.2)
CHLORIDE: 106 mmol/L (ref 96–111)
CO2 TOTAL: 23 mmol/L (ref 23–31)
CREATININE: 0.59 mg/dL — ABNORMAL LOW (ref 0.60–1.05)
ESTIMATED GFR: >90 mL/min/BSA (ref >=60)
GLUCOSE: 127 mg/dL — ABNORMAL HIGH (ref 65–125)
POTASSIUM: 4.2 mmol/L (ref 3.5–5.1)
PROTEIN TOTAL: 5.9 g/dL — ABNORMAL LOW (ref 6.0–8.0)
SODIUM: 137 mmol/L (ref 136–145)

## 2020-09-24 LAB — PRODUCT: PLATELETS - UNITS: UNIT DIVISION: 0

## 2020-09-24 LAB — MANUAL DIFF AND MORPHOLOGY-SYSMEX
BASOPHIL #: <0.1 10*3/uL (ref <=0.20)
BASOPHIL %: 0 %
EOSINOPHIL #: <0.1 10*3/uL (ref <=0.50)
EOSINOPHIL %: 0 %
LYMPHOCYTE #: 0.5 10*3/uL — ABNORMAL LOW (ref 1.00–4.80)
LYMPHOCYTE %: 39 %
MONOCYTE #: <0.1 10*3/uL — ABNORMAL LOW (ref 0.20–1.10)
MONOCYTE %: 8 %
NEUTROPHIL #: 0.52 10*3/uL — ABNORMAL LOW (ref 1.50–7.70)
NEUTROPHIL %: 42 %
NEUTROPHIL BANDS %: 5 %
RBC MORPHOLOGY: NORMAL
REACTIVE LYMPHOCYTE %: 6 %

## 2020-09-24 LAB — CBC WITH DIFF
HCT: 25.7 % — ABNORMAL LOW (ref 34.8–46.0)
HGB: 8.6 g/dL — ABNORMAL LOW (ref 11.5–16.0)
MCH: 30.6 pg (ref 26.0–32.0)
MCHC: 33.5 g/dL (ref 31.0–35.5)
MCV: 91.5 fL (ref 78.0–100.0)
MPV: 10.1 fL (ref 8.7–12.5)
PLATELETS: 55 10*3/uL — ABNORMAL LOW (ref 150–400)
RBC: 2.81 10*6/uL — ABNORMAL LOW (ref 3.85–5.22)
RDW-CV: 18.3 % — ABNORMAL HIGH (ref 11.5–15.5)
WBC: 1.1 10*3/uL — ABNORMAL LOW (ref 3.7–11.0)

## 2020-09-24 LAB — POC FINGERSTICK GLUCOSE - BMC/JMC (RESULTS)
GLUCOSE, POC: 193 mg/dl — ABNORMAL HIGH (ref 60–100)
GLUCOSE, POC: 167 mg/dl — ABNORMAL HIGH (ref 60–100)
GLUCOSE, POC: 154 mg/dl — ABNORMAL HIGH (ref 60–100)

## 2020-09-24 LAB — ECG 12-LEAD
Atrial Rate: 65 {beats}/min
Calculated R Axis: -7 degrees
Calculated T Axis: 26 degrees
QRS Duration: 88 ms
QT Interval: 426 ms
QTC Calculation: 443 ms
Ventricular rate: 65 {beats}/min

## 2020-09-24 LAB — PT/INR
INR: 1.32
PROTHROMBIN TIME: 15.4 seconds — ABNORMAL HIGH (ref 9.4–12.5)

## 2020-09-24 MED ORDER — LORAZEPAM 2 MG/ML INJECTION SOLUTION
2.0000 mg | INTRAMUSCULAR | Status: DC | PRN
Start: 2020-09-24 — End: 2020-09-24

## 2020-09-24 MED ORDER — LEVETIRACETAM 1,000 MG/100 ML IN SODIUM CHLORIDE(ISO-OSM) IV PIGGYBACK
1000.0000 mg | INJECTION | INTRAVENOUS | Status: AC
Start: 2020-09-25 — End: 2020-09-25
  Administered 2020-09-25: 1000 mg via INTRAVENOUS
  Administered 2020-09-25: 0 mg via INTRAVENOUS
  Filled 2020-09-24: qty 100

## 2020-09-24 MED ORDER — LORAZEPAM 2 MG/ML INJECTION SOLUTION
2.0000 mg | INTRAMUSCULAR | Status: AC
Start: 2020-09-24 — End: 2020-09-24
  Administered 2020-09-24: 2 mg via INTRAVENOUS
  Filled 2020-09-24: qty 1

## 2020-09-24 NOTE — Nurses Notes (Signed)
Notified Tammy Martin with consult.

## 2020-09-24 NOTE — Nurses Notes (Signed)
Met with brother, HCS this afternoon.  Provided informational visit about HOTP services.  HCS states the family wishes for patient to receive additional evaluation at [facility]. HCS awaiting to speak with physician at [facility]. HCS states he would be open to hospice support in the future.   In agreement with continued HOTP evaluation during course of hospital stay.  Update to visit provided to [facility] CM and NP.

## 2020-09-24 NOTE — Nurses Notes (Signed)
Patient transferred to room 627. Report called to [facility]. Spoke with brother Carrie Escobar and made him aware of patients transfer to room 627. Patients brother is now requesting to change code status to DNR/DNI as of now.

## 2020-09-24 NOTE — Consults (Addendum)
[place]    Date of Service:  09/24/2020  Requesting MD: Dr. 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    - [place].  - Full code - Verified with brother. The brother states he would prefer no cpr for the patient, but his sister and brothers are "not letting me change it." "I don't want to butt heads with them."  - Reviewed course and goals with the brother by phone.    - Goals:  - The family is hopeful to get the patient to a previously scheduled appointment with oncology at [facility]. Appointment is 12/22. The brother acknowledges that the patient may decline before this time, and they will not be able to make the trip.  - The brother met with hospice here today for information. The family is planning on contacting hospice to initiate services after hearing recommendations at [facility] or if they are unable to make the trip.    - Thank you for asking me to consult on this patient.    HPI/Discussion:  Carrie Escobar is a 74 y.o. female with a hx of chronic DM2, seizure disorder, and AMS who presented with a near syncopal episode. The patient has a known glioblastoma s/p chemoradiation. CT head resulted an interval increase in size of the left hemispheric mass and interval development of brain herniation and mild hydrocephalus. Family was advised in the ER that the patient was not a surgical candidate and hospice was recommended. The patient is pleasantly confused. Laying in bed smiling. Denies pain. Conversation as above.     Past family, medical, and social history and ROS see H&P on 12.15.2021 by Dr. Brooke Dare, DNP    Information obtained from: patient, relative, health care provider and history  reviewed via medical record    ROS: Limited ROS. AMS. Denies pain, headache, cp or sob. Denies feeling cold. Has no complaints. Says she is "just laying here being cool."    Pt/Family unit dynamics: The brother Carrie Escobar has been caring for the patient. Also has a sister and several other brothers.       Exam:  Temperature: 37.1 C (98.8 F)  Heart Rate: 66  BP (Non-Invasive): 127/68  Respiratory Rate: 14  SpO2: 100 %  Constitutional: Alert,  pleasantly confused, cannot tell me her name or where she is, vss, afebrile, frail  HEENT: Eyes clear w/o redness or discharge, no icterus noted  Cardiac: Normal rate and rhythm, on telemetry  Respiratory: Normal respiratory pattern, no respiratory distress noted, no cough   Vascular: No edema, radial pulses regular  GI: Abdomen soft, nondistended, normal bs t/o  GU: Foley catheter in use  Neuro: Alert, confused, normal speech, no tremor, generalized weakness t/o  Skin: Warm and dry  Psych: Calm, normal affect     Ventilator Settings: N/A  HFNC Settings: RA  BIPAP Settings:N/A    Palliative Performance Scale -  30    Supportive care medications: None    Labs:  I have reviewed all lab results.  Lab Results Today:    Results for orders placed or performed during the hospital encounter of 09/23/20 (from the past 24 hour(s))   TYPE AND SCREEN   Result Value Ref Range    UNITS ORDERED NOT STATED         ABO/RH(D) B POSITIVE     ANTIBODY SCREEN NEGATIVE     SPECIMEN EXPIRATION DATE 09/26/2020    PRODUCT: PLATELETS - UNITS , 1 Units   Result Value Ref Range    Coding System ISBT128     UNIT NUMBER [id number]     BLOOD COMPONENT TYPE PATHOGEN REDUCED PLASMA REDUCED1     UNIT DIVISION 00     UNIT DISPENSE STATUS ISSUED,FINAL     TRANSFUSION STATUS OK TO TRANSFUSE     Product Code [id number]    CBC WITH DIFF   Result Value Ref Range    WBC 1.1 (L) 3.7 - 11.0 x103/uL    RBC 3.28 (L) 3.85 - 5.22 x106/uL    HGB 10.1 (L) 11.5 - 16.0 g/dL    HCT 30.5 (L) 34.8 - 46.0 %    MCV 93.0 78.0 -  100.0 fL    MCH 30.8 26.0 - 32.0 pg    MCHC 33.1 31.0 - 35.5 g/dL    RDW-CV 18.3 (H) 11.5 - 15.5 %    PLATELETS 12 (LL) 150 - 400 x103/uL   MANUAL DIFF AND MORPHOLOGY-SYSMEX   Result Value Ref Range    NEUTROPHIL % 38 %    LYMPHOCYTE %  54 %    MONOCYTE % 7 %    EOSINOPHIL % 0 %    BASOPHIL % 0 %    NEUTROPHIL BANDS % 1 %    NEUTROPHIL # 0.43 (L) 1.50 - 7.70 x103/uL    LYMPHOCYTE # 0.59 (L) 1.00 - 4.80 x103/uL    MONOCYTE # <0.10 (L) 0.20 - 1.10 x103/uL    EOSINOPHIL # <0.10 <=0.50 x103/uL    BASOPHIL # <0.10 <=0.20 x103/uL    RBC MORPHOLOGY Normal RBC and PLT Morphology    POC FINGERSTICK GLUCOSE - [facility] (RESULTS)   Result Value Ref Range    GLUCOSE, POC 150 (H) 60 - 100 mg/dl   COMPREHENSIVE METABOLIC PANEL, NON-FASTING   Result Value Ref Range    SODIUM 137 136 - 145 mmol/L    POTASSIUM 4.2 3.5 - 5.1 mmol/L    CHLORIDE 106 96 - 111 mmol/L    CO2 TOTAL 23 23 - 31 mmol/L    ANION GAP 8 4 - 13 mmol/L    BUN 9 8 - 25 mg/dL    CREATININE 0.59 (L) 0.60 - 1.05 mg/dL    BUN/CREA RATIO 15 6 - 22    ESTIMATED GFR >90 >=60 mL/min/BSA    ALBUMIN 3.5 3.4 - 4.8 g/dL     CALCIUM 9.0 8.8 - 10.2 mg/dL    GLUCOSE 127 (H) 65 - 125 mg/dL    ALKALINE PHOSPHATASE 46 (L) 55 - 145 U/L    ALT (SGPT) 33 (H) 8 - 22 U/L    AST (SGOT)  26 8 - 45 U/L    BILIRUBIN TOTAL 1.0 0.3 - 1.3 mg/dL    PROTEIN TOTAL 5.9 (L) 6.0 - 8.0 g/dL   PT/INR   Result Value Ref Range    PROTHROMBIN TIME 15.4 (H) 9.4 - 12.5 seconds    INR 1.32    CBC WITH DIFF   Result Value Ref  Range    WBC 1.1 (L) 3.7 - 11.0 x103/uL    RBC 2.81 (L) 3.85 - 5.22 x106/uL    HGB 8.6 (L) 11.5 - 16.0 g/dL    HCT 25.7 (L) 34.8 - 46.0 %    MCV 91.5 78.0 - 100.0 fL    MCH 30.6 26.0 - 32.0 pg    MCHC 33.5 31.0 - 35.5 g/dL    RDW-CV 18.3 (H) 11.5 - 15.5 %    PLATELETS 55 (L) 150 - 400 x103/uL    MPV 10.1 8.7 - 12.5 fL   MANUAL DIFF AND MORPHOLOGY-SYSMEX   Result Value Ref Range    NEUTROPHIL % 42 %    LYMPHOCYTE %  39 %    MONOCYTE % 8 %    EOSINOPHIL % 0 %    BASOPHIL % 0 %     NEUTROPHIL BANDS % 5 %    REACTIVE LYMPHOCYTE % 6 %    NEUTROPHIL # 0.52 (L) 1.50 - 7.70 x103/uL    LYMPHOCYTE # 0.50 (L) 1.00 - 4.80 x103/uL    MONOCYTE # <0.10 (L) 0.20 - 1.10 x103/uL    EOSINOPHIL # <0.10 <=0.50 x103/uL    BASOPHIL # <0.10 <=0.20 x103/uL    TOXIC GRANULATION Present (A) None    RBC MORPHOLOGY Normal RBC and PLT Morphology    POC FINGERSTICK GLUCOSE - [facility] (RESULTS)   Result Value Ref Range    GLUCOSE, POC 193 (H) 60 - 100 mg/dl   POC FINGERSTICK GLUCOSE - [facility] (RESULTS)   Result Value Ref Range    GLUCOSE, POC 167 (H) 60 - 100 mg/dl       Imaging studies:  Images and Reports reviewed to current date.    Consultant:  Danae Orleans, NP    Total Time of Encounter:  45 minutes  Time in Counseling as above:  27 minutes      Danae Orleans, NP

## 2020-09-24 NOTE — Progress Notes (Addendum)
[facility]   IP PROGRESS NOTE      Carrie Escobar  Date of Admission:  09/23/2020  Date of Birth:  05/11/1946  Date of Service:  09/24/2020    Chief Complaint:  Near-syncope  Subjective:  Patient seen and examined bedside.  She is awake and alert oriented x1.    ROS:  Unable to obtain review of systems due to patient's altered mental status.    Assessment/ Plan:     74 year old female patient with a past medical history of PE, controlled type 2 diabetes seizure disorder and glioblastoma now admitted to the hospital due to increasing size of the tumor, cerebral edema.      Problem List:  Active Hospital Problems   (*Primary Problem)    Diagnosis    Vasogenic brain edema (CMS HCC)     Glioblastoma with increased edema, mass effect and brain herniation with mild hydrocephalus  Pancytopenia.   dx in October, has completed abbreviated course of radiation along with temodar. Followed by [facility], who she last saw 12/01, had interval imaging scheduled for end of this month   Appreciate input from Neurosurgery.  Oncology consultation pending.  Continue with Decadron.  Patient received 1 unit of platelets last night, improved to 55 this morning.  Palliative care consultation hospice consultation pending.    3. PE  dx in 07/2020  Holding Eliquis     4. Controlled Type II DM  A1C 6.4. on only metformin  hold in setting of recent contrast administration. Low dose ssi with Humalog if indicated    5. Seizure Disorder  --no seizure activity noted, continue home Keppra 1000 mg BID    6. Thyroid nodule  --[place] on 09/20 shows a complex nodule in the left thyroid lobe measuring 15 x 12 x 13 mm.  --IR US guided thyroid Biopsy was planned but more recent PE and the need foruninterrupted anticoagulation postponed the procedure  --recent tsh levels wdl    DVT PPx: contraindicated in setting of severe thrombocytopenia   Code Status:  Full Code, confirmed with family pending palliative care consult      Disposition :  Await medical optimization.    Multiple times to contact the family with no response.    Objective     Vital Signs:  Temp (24hrs) Max:37.3 C (99.1 F)      Temperature: 37.1 C (98.8 F)  BP (Non-Invasive): 114/66  MAP (Non-Invasive): 78 mmHG  Heart Rate: 66  Respiratory Rate: 14  SpO2: 100 %    Current Medications:  acetaminophen (OFIRMEV) 10 mg/mL IV 1,000 mg, 1,000 mg, Intravenous, Q6H PRN  acetaminophen (TYLENOL) tablet, 650 mg, Oral, Q6H PRN  amLODIPine (NORVASC) tablet, 10 mg, Oral, Daily  calcium carbonate-vitamin D3 1250mg  (500mg )-200 units per tablet, 1 Tablet, Oral, Daily  dexamethasone 4 mg/mL injection, 4 mg, Intravenous, Q6H  SSIP insulin lispro (HUMALOG) 100 units/mL SubQ pen, 1-5 Units, Subcutaneous, 4x/day AC   And  dextrose 50% (0.5 g/mL) injection - syringe, 12.5 g, Intravenous, Q15 Min PRN  levETIRAcetam (KEPPRA) tablet, 1,000 mg, Oral, 2x/day  NS 250 mL flush bag, , Intravenous, Q1H PRN  NS flush syringe, 10 mL, Intravenous, Q8HRS  NS flush syringe, 10 mL, Intravenous, Q1H PRN  ondansetron (ZOFRAN) 2 mg/mL injection, 4 mg, Intravenous, Q6H PRN  potassium chloride (MICRO K) 10 mEq capsule, 40 mEq, Oral, 2x/day-Food        Today's Physical Exam:  General: no acute distress, resting comfortably    Eyes:  Pupils equal and round, reactive to light. Anicteric  HEENT: Head atraumatic and normocephalic, MMM  Neck: No JVD or thyromegaly or lymphadenopathy   Lungs: No respiratory distress. Lungs CTAB. No w/r/r.   Cardiovascular: normal rate, regular rhythm, S1, S2 normal, no murmur appreciated  Abdomen: Soft, non-tender, non-distended, bowel sounds normal  Extremities: extremities normal, atraumatic, no cyanosis or edema. DP pulses 2+ and equal bilaterally.   Skin: Skin warm and dry   Neurologic: Alert, oriented, 4/5 right upper and lower extremity, 5/5 left upper and left lower extremity.  Lymphatics: No lymphadenopathy   Psychiatric:  A pleasantly confused.        I/O:  I/O last 24  hours:      Intake/Output Summary (Last 24 hours) at 09/24/2020 1255  Last data filed at 09/24/2020 0025  Gross per 24 hour   Intake 340 ml   Output --   Net 340 ml     I/O current shift:  No intake/output data recorded.      Labs  Please indicate ordered or reviewed)  Reviewed:   Lab Results for Last 24 Hours:    Results for orders placed or performed during the hospital encounter of 09/23/20 (from the past 24 hour(s))   COMPREHENSIVE METABOLIC PANEL, NON-FASTING   Result Value Ref Range    SODIUM 139 136 - 145 mmol/L    POTASSIUM 3.1 (L) 3.5 - 5.1 mmol/L    CHLORIDE 105 96 - 111 mmol/L    CO2 TOTAL 24 23 - 31 mmol/L    ANION GAP 10 4 - 13 mmol/L    BUN 7 (L) 8 - 25 mg/dL    CREATININE 0.66 0.60 - 1.05 mg/dL    BUN/CREA RATIO 11 6 - 22    ESTIMATED GFR >90 >=60 mL/min/BSA    ALBUMIN 3.6 3.4 - 4.8 g/dL     CALCIUM 8.9 8.8 - 10.2 mg/dL    GLUCOSE 163 (H) 65 - 125 mg/dL    ALKALINE PHOSPHATASE 45 (L) 55 - 145 U/L    ALT (SGPT) 27 (H) 8 - 22 U/L    AST (SGOT)  24 8 - 45 U/L    BILIRUBIN TOTAL 0.8 0.3 - 1.3 mg/dL    PROTEIN TOTAL 6.0 6.0 - 8.0 g/dL   ETHANOL, SERUM   Result Value Ref Range    ETHANOL None Detected    TROPONIN-I   Result Value Ref Range    TROPONIN I 8 7 - 30 ng/L   CBC WITH DIFF   Result Value Ref Range    WBC 1.7 (L) 3.7 - 11.0 x103/uL    RBC 3.09 (L) 3.85 - 5.22 x106/uL    HGB 9.5 (L) 11.5 - 16.0 g/dL    HCT 28.8 (L) 34.8 - 46.0 %    MCV 93.2 78.0 - 100.0 fL    MCH 30.7 26.0 - 32.0 pg    MCHC 33.0 31.0 - 35.5 g/dL    RDW-CV 18.7 (H) 11.5 - 15.5 %    PLATELETS 14 (LL) 150 - 400 x103/uL   MANUAL DIFF AND MORPHOLOGY-SYSMEX   Result Value Ref Range    NEUTROPHIL % 16 %    LYMPHOCYTE %  67 %    MONOCYTE % 6 %    EOSINOPHIL % 1 %    BASOPHIL % 0 %    NEUTROPHIL BANDS % 5 %    METAMYELOCYTE %  1 %    MYELOCYTE % 1 %    REACTIVE LYMPHOCYTE %  3 %    NEUTROPHIL # 0.36 (L) 1.50 - 7.70 x103/uL    LYMPHOCYTE # 1.19 1.00 - 4.80 x103/uL    MONOCYTE # 0.10 (L) 0.20 - 1.10 x103/uL    EOSINOPHIL # <0.10 <=0.50  x103/uL    BASOPHIL # <0.10 <=0.20 x103/uL    NRBC FROM MANUAL DIFF 1 per 100 WBC   MAGNESIUM   Result Value Ref Range    MAGNESIUM 1.9 1.8 - 2.6 mg/dL   COVID-19, FLU A/B, RSV RAPID BY PCR   Result Value Ref Range    SARS-CoV-2 Not Detected Not Detected    INFLUENZA VIRUS TYPE A Not Detected Not Detected    INFLUENZA VIRUS TYPE B Not Detected Not Detected    RESPIRATORY SYNCTIAL VIRUS (RSV) Not Detected Not Detected   ECG 12-LEAD   Result Value Ref Range    Ventricular rate 65 BPM    Atrial Rate 65 BPM    QRS Duration 88 ms    QT Interval 426 ms    QTC Calculation 443 ms    Calculated R Axis -7 degrees    Calculated T Axis 26 degrees   DRUG SCREEN, NO CONFIRMATION, URINE   Result Value Ref Range    AMPHETAMINES, URINE Negative Negative    BARBITURATES URINE Negative Negative    BENZODIAZEPINES URINE Negative Negative    BUPRENORPHINE URINE Negative Negative    CANNABINOIDS URINE Negative Negative    COCAINE METABOLITES URINE Negative Negative    METHADONE URINE Negative Negative    OPIATES URINE (LOW CUTOFF) Negative Negative    OXYCODONE URINE Negative Negative    ECSTASY/MDMA URINE Negative Negative    FENTANYL, RANDOM URINE Negative Negative    CREATININE RANDOM URINE 38 (L) 50 - 100 mg/dL   URINALYSIS WITH MICROSCOPIC REFLEX IF INDICATED [facility] ONLY   Result Value Ref Range    COLOR Light Yellow Light Yellow, Straw, Yellow    APPEARANCE Slightly Cloudy (A) Clear    PH 7.0 <8.0    LEUKOCYTES Negative Negative WBCs/uL    NITRITE Negative Negative    PROTEIN Negative Negative, 10  mg/dL    GLUCOSE Negative Negative mg/dL    KETONES Negative Negative mg/dL    UROBILINOGEN < 2.0 <=2.0 mg/dL    BILIRUBIN Negative Negative mg/dL    BLOOD Negative Negative mg/dL    SPECIFIC GRAVITY 1.007 <1.022   POC FINGERSTICK GLUCOSE - [facility] (RESULTS)   Result Value Ref Range    GLUCOSE, POC 111 (H) 60 - 100 mg/dl   URINALYSIS, MICROSCOPIC   Result Value Ref Range    RBCS 0-2 0 - 2 /hpf    WBCS 0-2 0 - 2 /hpf    BACTERIA Slight  (A) None /hpf    SQUAMOUS EPITHELIAL None 0 - 2 /hpf    TRANSITIONAL EPITHELIAL 0-2 0 - 2 /hpf    MUCOUS Slight (A) None, Light /hpf    AMORPHOUS SEDIMENT Slight (A) None /hpf    HYALINE CASTS 2-5 (A) 0 - 2 /lpf    GRANULAR CASTS 0-2 (A) None /lpf   TYPE AND SCREEN   Result Value Ref Range    UNITS ORDERED NOT STATED         ABO/RH(D) B POSITIVE     ANTIBODY SCREEN NEGATIVE     SPECIMEN EXPIRATION DATE 09/26/2020    PRODUCT: PLATELETS - UNITS , 1 Units   Result Value Ref Range    Coding System [phone]     UNIT NUMBER  [id number]     BLOOD COMPONENT TYPE PATHOGEN REDUCED PLASMA REDUCED1     UNIT DIVISION 00     UNIT DISPENSE STATUS ISSUED,FINAL     TRANSFUSION STATUS OK TO TRANSFUSE     Product Code [id number]    CBC WITH DIFF   Result Value Ref Range    WBC 1.1 (L) 3.7 - 11.0 x103/uL    RBC 3.28 (L) 3.85 - 5.22 x106/uL    HGB 10.1 (L) 11.5 - 16.0 g/dL    HCT 30.5 (L) 34.8 - 46.0 %    MCV 93.0 78.0 - 100.0 fL    MCH 30.8 26.0 - 32.0 pg    MCHC 33.1 31.0 - 35.5 g/dL    RDW-CV 18.3 (H) 11.5 - 15.5 %    PLATELETS 12 (LL) 150 - 400 x103/uL   MANUAL DIFF AND MORPHOLOGY-SYSMEX   Result Value Ref Range    NEUTROPHIL % 38 %    LYMPHOCYTE %  54 %    MONOCYTE % 7 %    EOSINOPHIL % 0 %    BASOPHIL % 0 %    NEUTROPHIL BANDS % 1 %    NEUTROPHIL # 0.43 (L) 1.50 - 7.70 x103/uL    LYMPHOCYTE # 0.59 (L) 1.00 - 4.80 x103/uL    MONOCYTE # <0.10 (L) 0.20 - 1.10 x103/uL    EOSINOPHIL # <0.10 <=0.50 x103/uL    BASOPHIL # <0.10 <=0.20 x103/uL    RBC MORPHOLOGY Normal RBC and PLT Morphology    POC FINGERSTICK GLUCOSE - [facility] (RESULTS)   Result Value Ref Range    GLUCOSE, POC 150 (H) 60 - 100 mg/dl   COMPREHENSIVE METABOLIC PANEL, NON-FASTING   Result Value Ref Range    SODIUM 137 136 - 145 mmol/L    POTASSIUM 4.2 3.5 - 5.1 mmol/L    CHLORIDE 106 96 - 111 mmol/L    CO2 TOTAL 23 23 - 31 mmol/L    ANION GAP 8 4 - 13 mmol/L    BUN 9 8 - 25 mg/dL    CREATININE 0.59 (L) 0.60 - 1.05 mg/dL    BUN/CREA RATIO 15 6 - 22    ESTIMATED GFR >90  >=60 mL/min/BSA    ALBUMIN 3.5 3.4 - 4.8 g/dL     CALCIUM 9.0 8.8 - 10.2 mg/dL    GLUCOSE 127 (H) 65 - 125 mg/dL    ALKALINE PHOSPHATASE 46 (L) 55 - 145 U/L    ALT (SGPT) 33 (H) 8 - 22 U/L    AST (SGOT)  26 8 - 45 U/L    BILIRUBIN TOTAL 1.0 0.3 - 1.3 mg/dL    PROTEIN TOTAL 5.9 (L) 6.0 - 8.0 g/dL   PT/INR   Result Value Ref Range    PROTHROMBIN TIME 15.4 (H) 9.4 - 12.5 seconds    INR 1.32    CBC WITH DIFF   Result Value Ref Range    WBC 1.1 (L) 3.7 - 11.0 x103/uL    RBC 2.81 (L) 3.85 - 5.22 x106/uL    HGB 8.6 (L) 11.5 - 16.0 g/dL    HCT 25.7 (L) 34.8 - 46.0 %    MCV 91.5 78.0 - 100.0 fL    MCH 30.6 26.0 - 32.0 pg    MCHC 33.5 31.0 - 35.5 g/dL    RDW-CV 18.3 (H) 11.5 - 15.5 %    PLATELETS 55 (L) 150 - 400 x103/uL    MPV 10.1 8.7 - 12.5 fL   MANUAL DIFF AND  MORPHOLOGY-SYSMEX   Result Value Ref Range    NEUTROPHIL % 42 %    LYMPHOCYTE %  39 %    MONOCYTE % 8 %    EOSINOPHIL % 0 %    BASOPHIL % 0 %    NEUTROPHIL BANDS % 5 %    REACTIVE LYMPHOCYTE % 6 %    NEUTROPHIL # 0.52 (L) 1.50 - 7.70 x103/uL    LYMPHOCYTE # 0.50 (L) 1.00 - 4.80 x103/uL    MONOCYTE # <0.10 (L) 0.20 - 1.10 x103/uL    EOSINOPHIL # <0.10 <=0.50 x103/uL    BASOPHIL # <0.10 <=0.20 x103/uL    TOXIC GRANULATION Present (A) None    RBC MORPHOLOGY Normal RBC and PLT Morphology    POC FINGERSTICK GLUCOSE - [facility] (RESULTS)   Result Value Ref Range    GLUCOSE, POC 193 (H) 60 - 100 mg/dl   POC FINGERSTICK GLUCOSE - [facility] (RESULTS)   Result Value Ref Range    GLUCOSE, POC 167 (H) 60 - 100 mg/dl           Radiology Tests (Please indicate ordered or reviewed)  Imaging from this admission were reviewed by me, interpreted by radiologist         Portions of this note may be dictated using voice recognition software or a dictation service. Variances in spelling and vocabulary are possible and unintentional. Not all errors are caught/corrected. Please notify the [facility] if any discrepancies are noted or if the meaning of any statement is not clear.

## 2020-09-24 NOTE — Nurses Notes (Signed)
Patient is back on the floor from MRI.

## 2020-09-24 NOTE — Consults (Signed)
[facility]  Neurosurgery Consult  Initial Consult Note      Carrie Escobar, Carrie Escobar, 74 y.o. female  Encounter Start Date:  09/23/2020  Inpatient Admission Date:   Date of Service: 09/24/2020  Date of Birth:  1946/03/04    Primary Service: ER  Requesting Faculty: [facility]  Reason for Consult: Progressive weakness      Information Obtained from: patient  Chief Complaint: Right leg weakness    HPI:  Carrie Escobar is a 74 y.o., 73 American female who presents with progressive right sided weakness with history of left hemispheric glioblastoma. The patient was originally diagnosed with a multifocal GBM in 06/2020 after a stereotactic biopsy. She subsequently underwent concomitant Temodar and radiation. Her brother recently visited with her and noted that she was dragging her right leg. She went to the ER with imaging revealing progression and Neurosurgery was consulted.     ROS: (MUST comment on all "Abnormal" findings)  Other than ROS in the HPI, all other systems were negative.    PAST MEDICAL/ FAMILY/ SOCIAL HISTORY:   Past Medical History:   Diagnosis Date    Acute metabolic encephalopathy 24/58/0998    Brain mass 06/29/2020    MRI: Multiple enhancing masses in the left parietal, temporal, and occipital lobes which are connected by masslike T2 FLAIR signal abnormality.  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  Largest mass extends along the ependymal lining of left lateral ventricle.    Breast mass, right     benign    Cancer (CMS HCC)     Cataract     bilat    Diabetes mellitus, type 2 (CMS HCC)     Embolism (CMS HCC)     Family history of colon cancer     Heartburn     HTN (hypertension)     Memory impairment 06/30/2020    Nausea with vomiting     sister with PONV    Obesity     Pulmonary emboli     only once in 1999 after her hysterectomy    Thyroid nodule     left     Wears glasses     reading    Weight loss, unintentional 07/22/2020         Medications  Prior to Admission     Prescriptions    acetaminophen (TYLENOL EXTRA STRENGTH) 500 mg Oral Tablet    Take 500 mg by mouth Every 4 hours as needed for Pain    amLODIPine (NORVASC) 10 mg Oral Tablet    TAKE 1 TABLET BY MOUTH EVERY DAY    apixaban (ELIQUIS) 5 mg Oral Tablet    Take 1 Tablet (5 mg total) by mouth Twice daily for 30 days    aspirin (ECOTRIN) 81 mg Oral Tablet, Delayed Release (E.C.)    Take 1 Tablet (81 mg total) by mouth Once a day    calcium citrate-vitamin D3 (CITRACAL) 200 mg-6.25 mcg (250 unit) Oral Tablet    Take 1 Tablet by mouth Once a day    dexAMETHasone (DECADRON) 2 mg Oral Tablet    Take 2 mg by mouth Twice daily. Indications: nausea and vomiting caused by cancer drugs    docusate sodium (COLACE) 100 mg Oral Capsule    Take 100 mg by mouth Twice daily. Indications: constipation    Flaxseed Oil 1,000 mg Oral Capsule    Take 1 Capsule by mouth Twice daily  Indications: herbal supplement     levETIRAcetam (KEPPRA) 1,000 mg Oral  Tablet    Take 1 Tablet (1,000 mg total) by mouth Twice daily    LORazepam (ATIVAN) 0.5 mg Oral Tablet    Take 0.5 mg by mouth Once a day. Indications: anxious    metFORMIN (GLUCOPHAGE XR) 500 mg Oral Tablet Sustained Release 24 hr    TAKE 1 TABLET (500 MG TOTAL) BY MOUTH EVERY DAY.    multivitamin Oral Tablet    Take 1 Tab by mouth Once a day    ondansetron (ZOFRAN ODT) 8 mg Oral Tablet, Rapid Dissolve    1 Tablet (8 mg total) by Sublingual route Every 8 hours as needed for Nausea/Vomiting for up to 90 days    polyethylene glycol (MIRALAX) 17 gram Oral Powder in Packet    Take 17 g by mouth 2X/day. Indications: constipation    temozolomide (TEMODAR) 100 mg Oral Capsule    Take 1 Capsule (100 mg total) by mouth Once a day for 14 days with 2 other temozolomide prescriptions for 130 mg total    Patient not taking:  Reported on 08/31/2020    temozolomide (TEMODAR) 20 mg Oral Capsule    Take 1 Capsule (20 mg total) by mouth Once a day for 14 days with 2 other temozolomide  prescriptions for 130 mg total    Patient not taking:  Reported on 08/31/2020    temozolomide (TEMODAR) 5 mg Oral Capsule    Take 2 Capsules (10 mg total) by mouth Once a day for 14 days with 2 other temozolomide prescriptions for 130 mg total    Patient not taking:  Reported on 08/31/2020    timolol maleate (TIMOPTIC) 0.25 % Ophthalmic Drops    Instill 1 Drop into both eyes Twice daily      traMADoL (ULTRAM) 50 mg Oral Tablet    Take 1 Tablet (50 mg total) by mouth Every 6 hours as needed    Patient not taking:  Reported on 08/19/2020     trimethoprim-sulfamethoxazole (BACTRIM DS) 160-800mg  per tablet    Take 1 Tablet (160 mg total) by mouth Every Monday, Wednesday and Friday for 18 doses    Patient taking differently:  Take 1 Tablet by mouth Every Monday, Wednesday and Friday. Indications: urinary tract infection prevention    wheat dextrin (BENEFIBER CLEAR SF, DEXTRIN, ORAL)    Take 2 teaspoons by mouth 2X/day. Indications: constipation    Patient not taking:  Reported on 09/09/2020          acetaminophen (OFIRMEV) 10 mg/mL IV 1,000 mg, 1,000 mg, Intravenous, Q6H PRN  acetaminophen (TYLENOL) tablet, 650 mg, Oral, Q6H PRN  amLODIPine (NORVASC) tablet, 10 mg, Oral, Daily  calcium carbonate-vitamin D3 1250mg  (500mg )-200 units per tablet, 1 Tablet, Oral, Daily  dexamethasone 4 mg/mL injection, 4 mg, Intravenous, Q6H  SSIP insulin lispro (HUMALOG) 100 units/mL SubQ pen, 1-5 Units, Subcutaneous, 4x/day AC   And  dextrose 50% (0.5 g/mL) injection - syringe, 12.5 g, Intravenous, Q15 Min PRN  levETIRAcetam (KEPPRA) tablet, 1,000 mg, Oral, 2x/day  NS 250 mL flush bag, , Intravenous, Q1H PRN  NS flush syringe, 10 mL, Intravenous, Q8HRS  NS flush syringe, 10 mL, Intravenous, Q1H PRN  ondansetron (ZOFRAN) 2 mg/mL injection, 4 mg, Intravenous, Q6H PRN  potassium chloride (MICRO K) 10 mEq capsule, 40 mEq, Oral, 2x/day-Food      Allergies   Allergen Reactions    Amoxicillin Rash     Past Surgical History:   Procedure Laterality  Date    20550 - INJ SINGLE TENDON SHEATH/LIGAMENT,  APONEUROSIS W/ [place] INTERP ONLY (AMB ONLY)  02/10/2016    COLONOSCOPY  2016    HX BREAST BIOPSY Right     BENIGN     HX CATARACT REMOVAL  10/2013    left    HX COLONOSCOPY      HX HYSTERECTOMY  1999    fibroid    HX ROTATOR CUFF REPAIR  11/04/2013    right shoulder         Social History     Tobacco Use    Smoking status: Never Smoker    Smokeless tobacco: Never Used   Vaping Use    Vaping Use: Never used   Substance Use Topics    Alcohol use: No     Alcohol/week: 0.0 standard drinks    Drug use: No     Family Medical History:     Problem Relation (Age of Onset)    Breast Cancer Maternal Grandmother    Cancer Mother, Sister    Diabetes Sister    Heart Attack Sister    Hypertension (High Blood Pressure) Mother, Sister    No Known Problems Brother, Maternal Grandfather, Paternal Grandmother, Paternal 5, Daughter, Son, Maternal Aunt, Maternal Uncle, Paternal 1, Paternal Uncle, Other    Stroke Mother, Father            PHYSICAL EXAMINATION: (MUST comment on all "Abnormal" findings)    Constitutional  Temperature: 36.3 C (97.3 F)  Heart Rate: 64  BP (Non-Invasive): 103/75  Respiratory Rate: 18  SpO2: 100 %  General appearance: Normal  Neurologic exam: A&O x 1.Strength 4+/5 RUE and RLE. Mild right drift. CN 2-12 intact     Labs Ordered/ Reviewed : (Please indicate ordered or reviewed)  Reviewed: Labs:  Lab Results for Last 24 Hours:    Results for orders placed or performed during the hospital encounter of 09/23/20 (from the past 24 hour(s))   COMPREHENSIVE METABOLIC PANEL, NON-FASTING   Result Value Ref Range    SODIUM 139 136 - 145 mmol/L    POTASSIUM 3.1 (L) 3.5 - 5.1 mmol/L    CHLORIDE 105 96 - 111 mmol/L    CO2 TOTAL 24 23 - 31 mmol/L    ANION GAP 10 4 - 13 mmol/L    BUN 7 (L) 8 - 25 mg/dL    CREATININE 0.66 0.60 - 1.05 mg/dL    BUN/CREA RATIO 11 6 - 22    ESTIMATED GFR >90 >=60 mL/min/BSA    ALBUMIN 3.6 3.4 - 4.8 g/dL     CALCIUM 8.9 8.8 -  10.2 mg/dL    GLUCOSE 163 (H) 65 - 125 mg/dL    ALKALINE PHOSPHATASE 45 (L) 55 - 145 U/L    ALT (SGPT) 27 (H) 8 - 22 U/L    AST (SGOT)  24 8 - 45 U/L    BILIRUBIN TOTAL 0.8 0.3 - 1.3 mg/dL    PROTEIN TOTAL 6.0 6.0 - 8.0 g/dL   ETHANOL, SERUM   Result Value Ref Range    ETHANOL None Detected    TROPONIN-I   Result Value Ref Range    TROPONIN I 8 7 - 30 ng/L   CBC WITH DIFF   Result Value Ref Range    WBC 1.7 (L) 3.7 - 11.0 x103/uL    RBC 3.09 (L) 3.85 - 5.22 x106/uL    HGB 9.5 (L) 11.5 - 16.0 g/dL    HCT 28.8 (L) 34.8 - 46.0 %    MCV 93.2 78.0 - 100.0 fL  MCH 30.7 26.0 - 32.0 pg    MCHC 33.0 31.0 - 35.5 g/dL    RDW-CV 18.7 (H) 11.5 - 15.5 %    PLATELETS 14 (LL) 150 - 400 x103/uL   MANUAL DIFF AND MORPHOLOGY-SYSMEX   Result Value Ref Range    NEUTROPHIL % 16 %    LYMPHOCYTE %  67 %    MONOCYTE % 6 %    EOSINOPHIL % 1 %    BASOPHIL % 0 %    NEUTROPHIL BANDS % 5 %    METAMYELOCYTE %  1 %    MYELOCYTE % 1 %    REACTIVE LYMPHOCYTE % 3 %    NEUTROPHIL # 0.36 (L) 1.50 - 7.70 x103/uL    LYMPHOCYTE # 1.19 1.00 - 4.80 x103/uL    MONOCYTE # 0.10 (L) 0.20 - 1.10 x103/uL    EOSINOPHIL # <0.10 <=0.50 x103/uL    BASOPHIL # <0.10 <=0.20 x103/uL    NRBC FROM MANUAL DIFF 1 per 100 WBC   MAGNESIUM   Result Value Ref Range    MAGNESIUM 1.9 1.8 - 2.6 mg/dL   COVID-19, FLU A/B, RSV RAPID BY PCR   Result Value Ref Range    SARS-CoV-2 Not Detected Not Detected    INFLUENZA VIRUS TYPE A Not Detected Not Detected    INFLUENZA VIRUS TYPE B Not Detected Not Detected    RESPIRATORY SYNCTIAL VIRUS (RSV) Not Detected Not Detected   DRUG SCREEN, NO CONFIRMATION, URINE   Result Value Ref Range    AMPHETAMINES, URINE Negative Negative    BARBITURATES URINE Negative Negative    BENZODIAZEPINES URINE Negative Negative    BUPRENORPHINE URINE Negative Negative    CANNABINOIDS URINE Negative Negative    COCAINE METABOLITES URINE Negative Negative    METHADONE URINE Negative Negative    OPIATES URINE (LOW CUTOFF) Negative Negative    OXYCODONE  URINE Negative Negative    ECSTASY/MDMA URINE Negative Negative    FENTANYL, RANDOM URINE Negative Negative    CREATININE RANDOM URINE 38 (L) 50 - 100 mg/dL   URINALYSIS WITH MICROSCOPIC REFLEX IF INDICATED [facility] ONLY   Result Value Ref Range    COLOR Light Yellow Light Yellow, Straw, Yellow    APPEARANCE Slightly Cloudy (A) Clear    PH 7.0 <8.0    LEUKOCYTES Negative Negative WBCs/uL    NITRITE Negative Negative    PROTEIN Negative Negative, 10  mg/dL    GLUCOSE Negative Negative mg/dL    KETONES Negative Negative mg/dL    UROBILINOGEN < 2.0 <=2.0 mg/dL    BILIRUBIN Negative Negative mg/dL    BLOOD Negative Negative mg/dL    SPECIFIC GRAVITY 1.007 <1.022   POC FINGERSTICK GLUCOSE - [facility] (RESULTS)   Result Value Ref Range    GLUCOSE, POC 111 (H) 60 - 100 mg/dl   URINALYSIS, MICROSCOPIC   Result Value Ref Range    RBCS 0-2 0 - 2 /hpf    WBCS 0-2 0 - 2 /hpf    BACTERIA Slight (A) None /hpf    SQUAMOUS EPITHELIAL None 0 - 2 /hpf    TRANSITIONAL EPITHELIAL 0-2 0 - 2 /hpf    MUCOUS Slight (A) None, Light /hpf    AMORPHOUS SEDIMENT Slight (A) None /hpf    HYALINE CASTS 2-5 (A) 0 - 2 /lpf    GRANULAR CASTS 0-2 (A) None /lpf   TYPE AND SCREEN   Result Value Ref Range    UNITS ORDERED NOT STATED  ABO/RH(D) B POSITIVE     ANTIBODY SCREEN NEGATIVE     SPECIMEN EXPIRATION DATE 09/26/2020    PRODUCT: PLATELETS - UNITS , 1 Units   Result Value Ref Range    Coding System [id number]     UNIT NUMBER [id number]     BLOOD COMPONENT TYPE PATHOGEN REDUCED PLASMA REDUCED1     UNIT DIVISION 00     UNIT DISPENSE STATUS ISSUED,FINAL     TRANSFUSION STATUS OK TO TRANSFUSE     Product Code [id number]    CBC WITH DIFF   Result Value Ref Range    WBC 1.1 (L) 3.7 - 11.0 x103/uL    RBC 3.28 (L) 3.85 - 5.22 x106/uL    HGB 10.1 (L) 11.5 - 16.0 g/dL    HCT 30.5 (L) 34.8 - 46.0 %    MCV 93.0 78.0 - 100.0 fL    MCH 30.8 26.0 - 32.0 pg    MCHC 33.1 31.0 - 35.5 g/dL    RDW-CV 18.3 (H) 11.5 - 15.5 %    PLATELETS 12 (LL) 150 - 400 x103/uL    MANUAL DIFF AND MORPHOLOGY-SYSMEX   Result Value Ref Range    NEUTROPHIL % 38 %    LYMPHOCYTE %  54 %    MONOCYTE % 7 %    EOSINOPHIL % 0 %    BASOPHIL % 0 %    NEUTROPHIL BANDS % 1 %    NEUTROPHIL # 0.43 (L) 1.50 - 7.70 x103/uL    LYMPHOCYTE # 0.59 (L) 1.00 - 4.80 x103/uL    MONOCYTE # <0.10 (L) 0.20 - 1.10 x103/uL    EOSINOPHIL # <0.10 <=0.50 x103/uL    BASOPHIL # <0.10 <=0.20 x103/uL    RBC MORPHOLOGY Normal RBC and PLT Morphology    POC FINGERSTICK GLUCOSE - [facility] (RESULTS)   Result Value Ref Range    GLUCOSE, POC 150 (H) 60 - 100 mg/dl   COMPREHENSIVE METABOLIC PANEL, NON-FASTING   Result Value Ref Range    SODIUM 137 136 - 145 mmol/L    POTASSIUM 4.2 3.5 - 5.1 mmol/L    CHLORIDE 106 96 - 111 mmol/L    CO2 TOTAL 23 23 - 31 mmol/L    ANION GAP 8 4 - 13 mmol/L    BUN 9 8 - 25 mg/dL    CREATININE 0.59 (L) 0.60 - 1.05 mg/dL    BUN/CREA RATIO 15 6 - 22    ESTIMATED GFR >90 >=60 mL/min/BSA    ALBUMIN 3.5 3.4 - 4.8 g/dL     CALCIUM 9.0 8.8 - 10.2 mg/dL    GLUCOSE 127 (H) 65 - 125 mg/dL    ALKALINE PHOSPHATASE 46 (L) 55 - 145 U/L    ALT (SGPT) 33 (H) 8 - 22 U/L    AST (SGOT)  26 8 - 45 U/L    BILIRUBIN TOTAL 1.0 0.3 - 1.3 mg/dL    PROTEIN TOTAL 5.9 (L) 6.0 - 8.0 g/dL   PT/INR   Result Value Ref Range    PROTHROMBIN TIME 15.4 (H) 9.4 - 12.5 seconds    INR 1.32    CBC WITH DIFF   Result Value Ref Range    WBC 1.1 (L) 3.7 - 11.0 x103/uL    RBC 2.81 (L) 3.85 - 5.22 x106/uL    HGB 8.6 (L) 11.5 - 16.0 g/dL    HCT 25.7 (L) 34.8 - 46.0 %    MCV 91.5 78.0 - 100.0 fL    MCH 30.6 26.0 -  32.0 pg    MCHC 33.5 31.0 - 35.5 g/dL    RDW-CV 18.3 (H) 11.5 - 15.5 %    PLATELETS 55 (L) 150 - 400 x103/uL    MPV 10.1 8.7 - 12.5 fL       Radiology Tests Ordered/ Reviewed: (Please indicate ordered or reviewed)  MRI Brain: FINDINGS: There is significant interval increase in size of a large, heterogeneously enhancing mass in the left lateral hemisphere, now measuring 10 x 4.8 x 4.7 cm. This is centered in the left temporal, parietal  and occipital periventricular region and extends along the ependymal surface of the ventricle. There is marked peritumoral T2/FLAIR signal hyperintensity in the left cerebral hemisphere with new left uncal and transtentorial herniation causing effacement of the left perimesencephalic cistern. Multiple small foci of signal dropout are identified within the tumor on the blood sensitive sequence, representing internal blood products. Small ring-enhancing components of the mass are noted in the left parieto-occipital region. Small focus of enhancement along the left falx is stable. There is significant effacement of the left lateral ventricle body, atrium, occipital and temporal horns with mild CSF trapping in the anterior portion of the left temporal horn. Mild dilation of the right lateral ventricle is also now seen due to CSF trapping. There is rightward midline shift measuring 7 mm. There is no cerebellar tonsillar ectopia.     Major intracranial vascular flow-voids are intact.     There is no fluid signal abnormality in the paranasal sinuses or mastoid air cells.     IMPRESSION:    Significant interval increase in size of the left cerebral hemispheric mass, consistent with glioblastoma. Increased edema, mass effect and brain herniation with new mild hydrocephalus.        Impression/Recommendations  Ms. Carrie Escobar is a pleasant 74 year old female presenting to the ER with a progressive multifocal GBM. She is s/p chemotherapy and radiation and now shows signs and symptoms of significant progression. In addition, platelet count initially 14,000 increased to 55,000 with transfusion. There is no surgical intervention warranted. Recommend discuss with oncology consideration for Hospice care at this time. Please call with any questions or concerns.

## 2020-09-24 NOTE — Care Management Notes (Signed)
I received a call from [place] with [facility]. Their nurse will see patient today between 1-130pm. I let Taleesha, UC know.

## 2020-09-24 NOTE — Consults (Addendum)
[facility]  Initial Note      Carrie Escobar, [place]  Encounter Start Date:  09/23/2020  Inpatient Admission Date: 09/23/2020  Date of Service: 09/24/2020  Date of Birth:  06/29/1946    Requesting Provider: Dr. Reesa Chew  Information Obtained from: patient, relative and history reviewed via medical record  Reason for Consultation:  "pt with hx of GBM, advancing. thrombocytopenia"    HPI:  Carrie Escobar is a 74 y.o. female with past medical history outlined below, admitted with brain herniation, edema, and progression of intracranial disease.  Patient was seen by me yesterday in the emergency room, and conversation held with her brother today via telephone.  She is pleasantly confused at baseline, and does not recall recent events.  Her brother states that he had not seen her for few days, and was visiting noticed that she had worsening weakness when attempting to stand.  He was told that she had difficulty holding a fork over the past 3-4 days, and was more confused on his evaluation.  Today, her brother states that she seems to be more at her baseline mentation wise, but she continues to be weaker than she was when she was at home.    A goals of care discussion was held with myself, as well as Neurosurgery.  Palliative care met with patient today.    ________________________________________________________________________________________________________________________________________________     Diagnosis-GBM, diagnosed September 2021  Stage-Multifocal GBM-Unresectable.  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  MSI Stable, MMR-proficient, tumor mutational burden low at 3 mutations/Mb.  PDL-1 expression negative.  No additional targetable mutation  detected    ___________________________________________________________  Carrie Escobar was admitted to [facility] between 9/18 and 09/29 with worsening headaches, memory loss and confusion.    MRI BRAIN :Multiple enhancing masses in the left parietal, temporal and occipital lobes which are connected by masslike T2/FLAIR signal abnormality which also extends into the splenium of the corpus callosum. 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 The largest mass extends along the  ependymal lining of the left lateral ventricle. 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    Neurosurgery, Neurology and Medical Oncology were involved in her care.    07/01/2020-Left temporal Stealth guided burr hole and tumor biopsy (Left) done by Dr Ebony Hail. The left temporal mass showed evidence of gliosis and H of lesional tissue with doses. The left temporal mass showed extensively necrotic glioma-favoring GBM grade 4.    MGMT methylation-Not detected  1p loss-detected  19q loss-Not detected    Between 10/20-and 10/24 admitted to the hospital with cough and breathing difficulty.  Was diagnosed with pulmonary embolus-and started on Eliquis at discharge.  Plan for concurrent chemo RT using Temodar as a radiosensitizer.    Cycle 1 day 08/06/2020.  Completed chemotherapy with Temodar with radiation treatments on 08/27/2020.        ________________________________________________________________________________________________________________________________________________     Objective   Past Medical History:   Diagnosis Date    Acute metabolic encephalopathy 54/06/8118    Brain mass 06/29/2020    MRI: Multiple enhancing masses in the left parietal, temporal, and occipital lobes which are connected by masslike T2 FLAIR signal abnormality.  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  Largest mass extends along the ependymal lining of left lateral ventricle.    Breast  mass, right     benign     Cancer (CMS HCC)     Cataract     bilat    Diabetes mellitus, type 2 (CMS HCC)     Embolism (CMS HCC)     Family history of colon cancer     Heartburn     HTN (hypertension)     Memory impairment 06/30/2020    Nausea with vomiting     sister with PONV    Obesity     Pulmonary emboli     only once in 1999 after her hysterectomy    Thyroid nodule     left     Wears glasses     reading    Weight loss, unintentional 07/22/2020         Past Surgical History:   Procedure Laterality Date    20550 - INJ SINGLE TENDON SHEATH/LIGAMENT, APONEUROSIS W/ [place] INTERP ONLY (AMB ONLY)  02/10/2016    COLONOSCOPY  2016    HX BREAST BIOPSY Right     BENIGN     HX CATARACT REMOVAL  10/2013    left    HX COLONOSCOPY      HX HYSTERECTOMY  1999    fibroid    HX ROTATOR CUFF REPAIR  11/04/2013    right shoulder         Medications Prior to Admission     Prescriptions    acetaminophen (TYLENOL EXTRA STRENGTH) 500 mg Oral Tablet    Take 500 mg by mouth Every 4 hours as needed for Pain    amLODIPine (NORVASC) 10 mg Oral Tablet    TAKE 1 TABLET BY MOUTH EVERY DAY    apixaban (ELIQUIS) 5 mg Oral Tablet    Take 1 Tablet (5 mg total) by mouth Twice daily for 30 days    aspirin (ECOTRIN) 81 mg Oral Tablet, Delayed Release (E.C.)    Take 1 Tablet (81 mg total) by mouth Once a day    calcium citrate-vitamin D3 (CITRACAL) 200 mg-6.25 mcg (250 unit) Oral Tablet    Take 1 Tablet by mouth Once a day    dexAMETHasone (DECADRON) 2 mg Oral Tablet    Take 2 mg by mouth Twice daily. Indications: nausea and vomiting caused by cancer drugs    docusate sodium (COLACE) 100 mg Oral Capsule    Take 100 mg by mouth Twice daily. Indications: constipation    Flaxseed Oil 1,000 mg Oral Capsule    Take 1 Capsule by mouth Twice daily  Indications: herbal supplement     levETIRAcetam (KEPPRA) 1,000 mg Oral Tablet    Take 1 Tablet (1,000 mg total) by mouth Twice daily    LORazepam (ATIVAN) 0.5 mg Oral Tablet    Take 0.5 mg by mouth Once a day. Indications:  anxious    metFORMIN (GLUCOPHAGE XR) 500 mg Oral Tablet Sustained Release 24 hr    TAKE 1 TABLET (500 MG TOTAL) BY MOUTH EVERY DAY.    multivitamin Oral Tablet    Take 1 Tab by mouth Once a day    ondansetron (ZOFRAN ODT) 8 mg Oral Tablet, Rapid Dissolve    1 Tablet (8 mg total) by Sublingual route Every 8 hours as needed for Nausea/Vomiting for up to 90 days    polyethylene glycol (MIRALAX) 17 gram Oral Powder in Packet    Take 17 g by mouth 2X/day. Indications: constipation    temozolomide (TEMODAR) 100 mg Oral Capsule    Take 1 Capsule (100  mg total) by mouth Once a day for 14 days with 2 other temozolomide prescriptions for 130 mg total    Patient not taking:  Reported on 08/31/2020    temozolomide (TEMODAR) 20 mg Oral Capsule    Take 1 Capsule (20 mg total) by mouth Once a day for 14 days with 2 other temozolomide prescriptions for 130 mg total    Patient not taking:  Reported on 08/31/2020    temozolomide (TEMODAR) 5 mg Oral Capsule    Take 2 Capsules (10 mg total) by mouth Once a day for 14 days with 2 other temozolomide prescriptions for 130 mg total    Patient not taking:  Reported on 08/31/2020    timolol maleate (TIMOPTIC) 0.25 % Ophthalmic Drops    Instill 1 Drop into both eyes Twice daily      traMADoL (ULTRAM) 50 mg Oral Tablet    Take 1 Tablet (50 mg total) by mouth Every 6 hours as needed    Patient not taking:  Reported on 08/19/2020     trimethoprim-sulfamethoxazole (BACTRIM DS) 160-872m per tablet    Take 1 Tablet (160 mg total) by mouth Every Monday, Wednesday and Friday for 18 doses    Patient taking differently:  Take 1 Tablet by mouth Every Monday, Wednesday and Friday. Indications: urinary tract infection prevention    wheat dextrin (BENEFIBER CLEAR SF, DEXTRIN, ORAL)    Take 2 teaspoons by mouth 2X/day. Indications: constipation    Patient not taking:  Reported on 09/09/2020         acetaminophen (OFIRMEV) 10 mg/mL IV 1,000 mg, 1,000 mg, Intravenous, Q6H PRN  acetaminophen (TYLENOL) tablet,  650 mg, Oral, Q6H PRN  amLODIPine (NORVASC) tablet, 10 mg, Oral, Daily  calcium carbonate-vitamin D3 12535m(50025m200 units per tablet, 1 Tablet, Oral, Daily  dexamethasone 4 mg/mL injection, 4 mg, Intravenous, Q6H  SSIP insulin lispro (HUMALOG) 100 units/mL SubQ pen, 1-5 Units, Subcutaneous, 4x/day AC   And  dextrose 50% (0.5 g/mL) injection - syringe, 12.5 g, Intravenous, Q15 Min PRN  levETIRAcetam (KEPPRA) tablet, 1,000 mg, Oral, 2x/day  NS 250 mL flush bag, , Intravenous, Q1H PRN  NS flush syringe, 10 mL, Intravenous, Q8HRS  NS flush syringe, 10 mL, Intravenous, Q1H PRN  ondansetron (ZOFRAN) 2 mg/mL injection, 4 mg, Intravenous, Q6H PRN      Allergies   Allergen Reactions    Amoxicillin Rash       Family History: Reviewed, discussed  Family Medical History:     Problem Relation (Age of Onset)    Breast Cancer Maternal Grandmother    Cancer Mother, Sister    Diabetes Sister    Heart Attack Sister    Hypertension (High Blood Pressure) Mother, Sister    No Known Problems Brother, Maternal Grandfather, Paternal Grandmother, Paternal Grandfather, Daughter, Son, Maternal Aunt, Maternal Uncle, Paternal Aun62aternal Uncle, Other    Stroke Mother, Father          Social History  Social History     Socioeconomic History    Marital status: Divorced     Spouse name: Not on file    Number of children: 0    Years of education: Not on file    Highest education level: Not on file   Occupational History     Employer: [employer]   Tobacco Use    Smoking status: Never Smoker    Smokeless tobacco: Never Used   Vaping Use    Vaping Use: Never used   Substance and  Sexual Activity    Alcohol use: No     Alcohol/week: 0.0 standard drinks    Drug use: No    Sexual activity: Not Currently   Other Topics Concern    Abuse/Domestic Violence No    Breast Self Exam Not Asked    Caffeine Concern Not Asked    Calcium intake adequate Not Asked    Computer Use Not Asked    Drives Yes    Exercise Concern Not Asked    Helmet Use  Not Asked    Seat Belt Not Asked    Special Diet No    Sunscreen used Not Asked    Uses Cane No    Uses walker No    Uses wheelchair No    Right hand dominant Yes    Left hand dominant No    Ambidextrous Not Asked    Shift Work Not Asked    Unusual Sleep-Wake Schedule Not Asked    Ability to Walk 1 Flight of Steps without SOB/CP Yes    Routine Exercise Yes     Comment: Zumba class    Ability to Walk 2 Flight of Steps without SOB/CP Yes    Unable to Ambulate Not Asked    Total Care Not Asked    Ability To Do Own ADL's Yes    Uses Walker Not Asked    Other Activity Level Not Asked    Uses Cane Not Asked   Social History Narrative    Not on file     Social Determinants of Health     Financial Resource Strain: Not on file   Food Insecurity: Not on file   Transportation Needs: Not on file   Physical Activity: Not on file   Stress: Not on file   Intimate Partner Violence: Not on file       REVIEW OF SYSTEMS  Review of Systems   Neurological:        Weakness     Psychiatric/Behavioral: Positive for confusion.   All other systems reviewed and are negative.       EXAM  VITALS:    Temperature: 36.5 C (97.7 F)  Heart Rate: 60  BP (Non-Invasive): 114/67  Respiratory Rate: 14  SpO2: 100 %  Physical Exam  Vitals reviewed.   Cardiovascular:      Rate and Rhythm: Normal rate.   Pulmonary:      Effort: Pulmonary effort is normal.   Abdominal:      Palpations: Abdomen is soft.      Tenderness: There is no abdominal tenderness.   Musculoskeletal:         General: No swelling.   Skin:     General: Skin is warm and dry.   Neurological:      Mental Status: She is alert. She is disoriented.      Motor: Weakness present.   Psychiatric:         Mood and Affect: Mood normal.      Comments: confused, pleasant          IMAGES:  reviewed by radiology and independently by me, agree with radiologic description      LABS:  I have reviewed all lab results.           ________________________________________________________________________________________________________________________________________________     Impression:  Annasophia Crocker is a 74 y.o. female with MGMT unmethylated, 1p loss, 19q intace GBM who was unresectable on presentation and s/p chemoRT with temozolomid completed 08/27/2020.  She is admitted  with worsening confusion and weakness, found to have worsening edema and progression of mass on repeat MRI in the ED.  She is not a neurosurgical candidate, and has had improvement with steroid administration.  Her brother understands her overall poor prognosis, as well as wanting her to have the past quality of life.  The family is seeking a secondary opinion at a tertiary care center, [facility], to have solidify the plan to either pursue more aggressive treatment versus hospice.    Heme/Onc Diagnosis:   GBM  brain herniation  brain edema  thrombocytopenia    Recommendations:   -progression noted on review of MRI in both size of mass, and worsening edema  -overall condition improved today, repeat MRI ordered to assess response  -Appt scheduled at [facility] 09/30/2020, family should follow up and discuss finalize plan with Dr. Oletta Darter after  -continue dexamethasone as outpatient  -palliative care has met with family, brother has information from hospice, will call after they make a decision as a whole  -anticipated discharge with return to baseline functional status.  -she is not a surgical candidate, discussion held with Dr. Ebony Hail who also met with family in the ED  -etiology of thrombocytopenia unknown, please hold anticoagulation, no history of bleed, not currently on meds  -she is s/p 1U plt, please continue to trend - call with worsening if she has prolonged admission    Will sign off, please call with any questions    Carlena Hurl, MD

## 2020-09-24 NOTE — Nurses Notes (Signed)
Patient off the floor for MRI. Premedicated with Ativan.

## 2020-09-24 NOTE — Care Management Notes (Signed)
09/23/20 1814  IP CONSULT TO CARE MANAGEMENT ([facility] - DO NOT USE FOR [facility] PATIENTS)  ONE TIME     Complete  Discontinue     Process Instructions: If requesting assistance with completion of Advance Directives, please provide patient with the packet.   References:    ON CALL ([place])   Provider: (Not yet assigned)   Question: Indication For Consult: Answer: HOSPICE INPATIENT        I sent referral via [facility] to [facility].

## 2020-09-25 LAB — POC FINGERSTICK GLUCOSE - BMC/JMC (RESULTS)
GLUCOSE, POC: 185 mg/dl — ABNORMAL HIGH (ref 60–100)
GLUCOSE, POC: 174 mg/dl — ABNORMAL HIGH (ref 60–100)

## 2020-09-25 LAB — COVID-19 CEPHEID - LAB USE ONLY: SARS-CoV-2: NOT DETECTED

## 2020-09-25 MED ORDER — LEVETIRACETAM 500 MG/5 ML (5 ML) ORAL SOLUTION
1000.0000 mg | Freq: Two times a day (BID) | ORAL | Status: AC
Start: 2020-09-25 — End: ?

## 2020-09-25 MED ORDER — DEXAMETHASONE 2 MG TABLET
ORAL_TABLET | ORAL | 0 refills | Status: AC
Start: 2020-09-25 — End: 2020-09-29

## 2020-09-25 MED ORDER — LEVETIRACETAM 500 MG/5 ML (5 ML) ORAL SOLUTION
1000.0000 mg | Freq: Two times a day (BID) | ORAL | Status: DC
Start: 2020-09-25 — End: 2020-09-25
  Administered 2020-09-25: 1000 mg via ORAL
  Filled 2020-09-25 (×7): qty 10

## 2020-09-25 NOTE — Care Management Notes (Signed)
09/25/20 0800   Social Work Plan   Anticipated Discharge Disposition   (Hospice Pending)     New patient assignment.  Chart review for status, needs, and consults.  Patient appears to be followed by Hospice and Palliative Care.    Received telephone call from Dr. Reesa Chew informing me patient's family appears ready for Hospice at this time.  Patient has been changed to DNR/DNI code status.     Telephoned Trudie, RN liaison with Hospice to inform her of change in status, and request follow up with family concerning Hospice services.  Trudie agrees to follow up with family and notify me of determination.

## 2020-09-25 NOTE — Care Management Notes (Signed)
Received telephone call from LeRoy, [place] liaison with Hospice.  She confirms family agrees to Inpatient Hospice, and requests COVID screening with discharge later this afternoon.    Message to Dr. Reesa Chew with update, and telephoned business office requesting admission IM; voicemail left for completion.

## 2020-09-25 NOTE — Nurses Notes (Signed)
Pt. discharged to hospice.

## 2020-09-25 NOTE — Care Plan (Signed)
Problem: Adult Inpatient Plan of Care  Goal: Plan of Care Review  Outcome: Ongoing (see interventions/notes)  Goal: Patient-Specific Goal (Individualized)  Outcome: Ongoing (see interventions/notes)  Goal: Absence of Hospital-Acquired Illness or Injury  Outcome: Ongoing (see interventions/notes)  Goal: Optimal Comfort and Wellbeing  Outcome: Ongoing (see interventions/notes)  Goal: Rounds/Family Conference  Outcome: Ongoing (see interventions/notes)     Problem: Skin Injury Risk Increased  Goal: Skin Health and Integrity  Outcome: Ongoing (see interventions/notes)     Problem: Fall Injury Risk  Goal: Absence of Fall and Fall-Related Injury  Outcome: Ongoing (see interventions/notes)

## 2020-09-25 NOTE — Nurses Notes (Signed)
Spoke with HCS, brother [name], via phone this am. Updated that patient is eligible for GIP LOC at [facility].  HCS in agreement with comfort care and transfer to IPF today.  HOTP requesting rapid COVID testing completion prior to discharge.  This Probation officer meeting brother at 1 pm at [facility] to sign consents.

## 2020-09-25 NOTE — Care Management Notes (Signed)
09/25/20 1300   Medicare Intent to Discharge Documentation   Discharge IMM give to: Patient Representative   Discharge IMM Letter Given Date 09/25/20   Discharge IMM Letter Given Time 1330   Discharge Patient Representative: [name], HCS   IMM explained/reviewed with:  Patient Representative;verbalized understanding;discussed via phone     Telephoned patient's HCS to discuss discharge, IM, and appeal process. Mr. Register verbalized understanding with no intent to appeal.  Copy of IM sent to patient via certified mail.

## 2020-09-25 NOTE — Discharge Summary (Signed)
Carrie Escobar  [place], [place] [address]    DISCHARGE SUMMARY      PATIENT NAME:  Carrie Escobar, Carrie Escobar  MRN:  [id number]  DOB:  1946/09/21    ADMISSION DATE:  09/23/2020  DISCHARGE DATE:  09/25/2020    ATTENDING PHYSICIAN: Carrie Brothers, MD  PRIMARY CARE PHYSICIAN: Carrie Memos, MD     ADMISSION DIAGNOSIS: <principal problem not specified>  DISCHARGE DIAGNOSIS:   Active Hospital Problems    Diagnosis Date Noted    Vasogenic brain edema (CMS [facility]) [G93.6] 09/23/2020      Resolved Hospital Problems   No resolved problems to display.     Active Non-Hospital Problems    Diagnosis Date Noted    Multiple pulmonary emboli (CMS [place]) 07/29/2020    GBM (glioblastoma multiforme) (CMS [facility]) 07/29/2020    Pulmonary infarction (CMS [place]) 07/29/2020    DM (diabetes mellitus), type 2 (CMS [place]) 07/29/2020    Brain mass 06/27/2020    Memory impairment 92/08/9416    Acute metabolic encephalopathy 40/81/4481    Trigger finger of right hand 02/10/2016    Atypical chest pain 12/04/2014    Dependent edema 04/08/2014    Routine general medical examination at a health care facility 01/15/2014    Senile nuclear sclerosis 10/03/2013    Rotator cuff syndrome of right shoulder 08/11/2013    Shoulder pain, right 02/26/2013    Lipoma of shoulder 02/26/2013      DISCHARGE MEDICATIONS:     Current Discharge Medication List      START taking these medications.      Details   levETIRAcetam 500 mg/5 mL (5 mL) Solution  Commonly known as: KEPPRA  Replaces: levETIRAcetam 1,000 mg Tablet   1,000 mg, Oral, 2 TIMES DAILY  Refills: 0        CONTINUE these medications which have CHANGED during your visit.      Details   dexAMETHasone 2 mg Tablet  Commonly known as: DECADRON  What changed:    how much to take   how to take this   when to take this   additional instructions   Take 2 Tab (4mg ) by mouth QID x1 day, then BID x1 day. Then 1 Tab (2mg ) BID x day, then QD x1 day.  Qty: 15 Tablet  Refills: 0        STOP  taking these medications.    amLODIPine 10 mg Tablet  Commonly known as: NORVASC     apixaban 5 mg Tablet  Commonly known as: ELIQUIS     aspirin 81 mg Tablet, Delayed Release (E.C.)  Commonly known as: ECOTRIN     BENEFIBER CLEAR SF (DEXTRIN) ORAL     calcium citrate-vitamin D3 200 mg-6.25 mcg (250 unit) Tablet  Commonly known as: CITRACAL     docusate sodium 100 mg Capsule  Commonly known as: COLACE     Flaxseed Oil 1,000 mg Capsule     levETIRAcetam 1,000 mg Tablet  Commonly known as: KEPPRA  Replaced by: levETIRAcetam 500 mg/5 mL (5 mL) Solution     LORazepam 0.5 mg Tablet  Commonly known as: ATIVAN     metFORMIN 500 mg Tablet Sustained Release 24 hr  Commonly known as: GLUCOPHAGE XR     multivitamin Tablet     ondansetron 8 mg Tablet, Rapid Dissolve  Commonly known as: ZOFRAN ODT     polyethylene glycol 17 gram Powder in Packet  Commonly known as: MIRALAX     temozolomide 100 mg  Capsule  Commonly known as: TEMODAR     temozolomide 20 mg Capsule  Commonly known as: TEMODAR     temozolomide 5 mg Capsule  Commonly known as: TEMODAR     timoloL maleate 0.25 % Drops  Commonly known as: TIMOPTIC     traMADoL 50 mg Tablet  Commonly known as: ULTRAM     trimethoprim-sulfamethoxazole 800-160 mg Tablet  Commonly known as: BACTRIM DS     Tylenol Extra Strength 500 mg Tablet  Generic drug: acetaminophen          DISCHARGE INSTRUCTIONS:   No discharge procedures on file.  REASON FOR HOSPITALIZATION AND HOSPITAL COURSE:  This is a 74 y.o., female with a past medical history of pulmonary embolism, type 2 diabetes, seizure disorder and thyroid nodule, history of glioblastoma multiforme who was brought into the hospital due to worsening mentation and right-sided weakness by family.  Patient was noted to have glioblastoma with increased edema, mass effect with brain herniation and mild hydrocephalus, was also noted to have worsening pancytopenia.  Patient was seen by Neurosurgery, patient was not a candidate for any surgical  intervention and they recommended hospice/palliative care.  This was discussed in detail with oncology as well.  Family initially did not want to hospice route, but they did agree and would like to send the patient to inpatient hospice.  We did do a repeat MRI to follow-up on initial, after Decadron there has been no improvement.  Patient was tested negative for COVID-19 as well.    Will finish a Decadron taper course over 4 days.    40 minute spent on discharge planning including coordination of care and counseling.    Objective      Physical exam :  General: no acute distress, resting comfortably   Eyes: Pupils equal and round, reactive to light. Anicteric  HEENT: Head atraumatic and normocephalic, MMM  Neck: No JVD or thyromegaly or lymphadenopathy  Lungs: No respiratory distress. Lungs CTAB. No w/r/r.   Cardiovascular: normal rate, regular rhythm, S1, S2 normal, no murmur appreciated  Abdomen: Soft, non-tender, non-distended, bowel sounds normal  Extremities: extremities normal, atraumatic, no cyanosis or edema. DP pulses 2+ and equal bilaterally.   Skin: Skin warm and dry  Neurologic: Alert, oriented, 4/5 right upper and lower extremity, 5/5 left upper and left lower extremity.  Lymphatics: No lymphadenopathy  Psychiatric:  A pleasantly confused.      COURSE IN HOSPITAL: As above.  Please see admitting physician/Midlevels H&P    CT BRAIN WO IV CONTRAST    Result Date: 09/23/2020  Carrie Escobar Female, 74 years old. CT BRAIN WO IV CONTRAST performed on 09/23/2020 4:46 PM. REASON FOR EXAM:  CVA?  Thrombocytopenia, on Eliquis TECHNIQUE: Axial noncontrast images of the brain from the vertex to the skull base obtained using soft tissue and bone algorithms with reformatted coronal and sagittal images. RADIATION DOSE: 1235.50 mGy.cm COMPARISON: Concurrent brain MRI FINDINGS: There is a left hemispheric mass in the left parietal, occipital and temporal lobes, better characterized on concurrent MRI, with  extensive surrounding parenchymal lucency and edema. The mass has increased in size since prior exam of 07/01/2020, and there is increased edema and rightward midline shift which now measures approximately 7 mm. New left uncal and transtentorial herniation with mild effacement of the left perimesencephalic cistern. Interval development of mild hydrocephalus, with dilation of the right lateral ventricle and anterior aspect of the left temporal horn. No acute hyperdense hemorrhage is identified. Visualized portions of  the paranasal sinuses and mastoid air cells are well aerated.     Interval increase in size of a left hemispheric mass, which is further described on concurrent MRI. Interval development of brain herniation and mild hydrocephalus. Findings were discussed with Dr. Keturah Barre by Dr. Murvin Natal via telephone on 09/23/2020 at 4:45 PM. Radiologist location ID: [id number]     MRI BRAIN WO CONTRAST    Result Date: 09/25/2020  Carrie Escobar Female, 74 years old. MRI BRAIN WO CONTRAST performed on 09/24/2020 9:38 PM. REASON FOR EXAM:  Follow up on herniation TECHNIQUE: MRI of the brain without contrast. Following sequences were obtained. T1 sagittal, DWI and ADC map, DTI, axial and coronal FLAIR, T1 and T2 axial, and T2 FFE axial. COMPARISON: MRI brain performed 09/23/2020. CT brain from 09/23/2020. MRI of the brain from 06/27/2020. FINDINGS:  There has been no interval change in mass effect of the mass centered within the left parietal lobe with extension into the splenium of the corpus callosum. The degree of vasogenic edema is similar as well. There is 7 mm of midline shift at the level of foramen of [place] (from left to right). This is unchanged (within measurement error) when compared to the prior study.  There are multiple foci of signal dropout within these masses likely representing internal hemorrhage. The vasogenic edema is unchanged compared to prior MRI brain from 09/23/2020.  Unchanged left-sided uncal and  transtentorial herniation. 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 There is redemonstration of mild dilation of the right ventricle and temporal horn of the left lateral ventricle, unchanged. There is no acute intracranial hemorrhage or extra-axial fluid collection. There is no acute infarct. Major intracranial flow voids are patent. The pituitary, brainstem structures and cerebellum is normal. The scalp and skull are normal. The globes are intact. The paranasal sinuses and mastoid air cells are clear.     1.No interval change in size of mass or associated tumor infiltration and edema when compared to the study of one day prior. 2.Unchanged rightward midline shift, left sided uncal and transtentorial herniations. Unchanged ventricular size. Radiologist location ID: [id number]     MRI BRAIN W/WO CONTRAST    Result Date: 09/23/2020  MRI BRAIN W/WO CONTRAST 09/23/2020 4:20 PM REASON FOR EXAM:  latered mental status, right sided weakness, hx of mass INTRAVENOUS CONTRAST:  13 ml of Prohance TECHNIQUE: Multiplanar multisequence MRI of the brain was performed with and without contrast COMPARISON: MRI brain 06/27/2020.   CT brain 07/03/2020. FINDINGS: There is significant interval increase in size of a large, heterogeneously enhancing mass in the left lateral hemisphere, now measuring 10 x 4.8 x 4.7 cm. This is centered in the left temporal, parietal and occipital periventricular region and extends along the ependymal surface of the ventricle. There is marked peritumoral T2/FLAIR signal hyperintensity in the left cerebral hemisphere with new left uncal and transtentorial herniation causing effacement of the left perimesencephalic cistern. Multiple small foci of signal dropout are identified within the tumor on the blood sensitive sequence, representing internal blood products. Small ring-enhancing components of the mass are noted in the left  parieto-occipital region. Small focus of enhancement along the left falx is stable. There is significant effacement of the left lateral ventricle body, atrium, occipital and temporal horns with mild CSF trapping in the anterior portion of the left temporal horn. Mild dilation of the right lateral ventricle is also now seen due  to CSF trapping. There is rightward midline shift measuring 7 mm. There is no cerebellar tonsillar ectopia. Major intracranial vascular flow-voids are intact. There is no fluid signal abnormality in the paranasal sinuses or mastoid air cells.     Significant interval increase in size of the left cerebral hemispheric mass, consistent with glioblastoma. Increased edema, mass effect and brain herniation with new mild hydrocephalus. Findings were discussed with Dr. Truman Hayward of emergency medicine by Dr. Murvin Natal via telephone on 09/23/2020 at 4:45 PM. Radiologist location ID: [id number]     XR AP MOBILE CHEST    Result Date: 09/23/2020  RADIOLOGIST: Gennie Alma, MD EXAMINATION: XR AP MOBILE CHEST EXAM DATE/TIME: 09/23/2020 1:37 PM CLINICAL INDICATION: Altered mental status FINDINGS: Cardiac silhouette is normal in size. Linear atelectasis and/or scarring right perihilar region. Infiltrate and/or atelectasis at the left lung base. No pleural effusions.     As above. Radiologist location ID: [id number]     ECG 12-LEAD    Result Date: 09/24/2020  NSR Junctional rhythm with occasional and consecutive Premature ventricular complexes Minimal voltage criteria for LVH, may be normal variant Nonspecific ST and T wave abnormality Abnormal ECG When compared with ECG of 30-Jul-2020 08:47, Junctional rhythm has replaced Sinus rhythm Confirmed by GLASS DO, ERIC ([id number]), editor CROWELL, TONYA ([id number]) on 09/24/2020 10:08:19 AM               CONDITION ON DISCHARGE: Lethargic    DISCHARGE DISPOSITION:  Home discharge  and Hospice      cc: Primary Care Physician:  Carrie Memos, MD  [address]  [place] [address]      AU:[id number] Physician:  No referring provider defined for this encounter.   Carrie Brothers, MD

## 2020-09-25 NOTE — Care Plan (Addendum)
Problem: Adult Inpatient Plan of Care  Goal: Plan of Care Review  Outcome: Ongoing (see interventions/notes)  Goal: Patient-Specific Goal (Individualized)  Outcome: Ongoing (see interventions/notes)  Goal: Absence of Hospital-Acquired Illness or Injury  Outcome: Ongoing (see interventions/notes)  Goal: Optimal Comfort and Wellbeing  Outcome: Ongoing (see interventions/notes)  Goal: Rounds/Family Conference  Outcome: Ongoing (see interventions/notes)     Problem: Skin Injury Risk Increased  Goal: Skin Health and Integrity  Outcome: Ongoing (see interventions/notes)     Problem: Fall Injury Risk  Goal: Absence of Fall and Fall-Related Injury  Outcome: Ongoing (see interventions/notes)   Patient alert but not oriented. Patient is awake and alert and talks but not appropriately. Patient unable to swallow medications this morning both with liquids and in applesauce. Physician made aware and order for Keppra changed to liquid. Patient incontinent of urine. Denied pain except for some with urination when patient became tearful with urination.     1430:Report called to Inpatient hospice center. Nurse denied questions or concerns.

## 2020-09-28 NOTE — Care Management Notes (Signed)
Referral Information  ++++++ Placed Provider #1 ++++++  Case Manager: Stephanie Taylor  Provider Type: Hospice - Outpatient  Provider Name: [facility]  Address:  [address]  [place], [place] [address]  Contact:  DON  Phone: [phone] x  Fax:   Fax: [phone]

## 2020-09-28 NOTE — Care Management Notes (Signed)
Discharge confirmation; complete.

## 2020-09-29 ENCOUNTER — Ambulatory Visit (HOSPITAL_COMMUNITY): Admission: RE | Admit: 2020-09-29 | Payer: Medicare PPO | Source: Ambulatory Visit

## 2020-09-30 LAB — SURGICAL PATHOLOGY SPECIMEN

## 2020-10-01 ENCOUNTER — Encounter (INDEPENDENT_AMBULATORY_CARE_PROVIDER_SITE_OTHER): Payer: Medicare PPO | Admitting: Geriatric Medicine

## 2020-10-13 ENCOUNTER — Encounter (HOSPITAL_COMMUNITY): Payer: Medicare PPO | Admitting: Internal Medicine

## 2020-10-19 ENCOUNTER — Encounter (INDEPENDENT_AMBULATORY_CARE_PROVIDER_SITE_OTHER): Payer: Self-pay | Admitting: Neurological Surgery

## 2020-10-23 ENCOUNTER — Encounter (HOSPITAL_BASED_OUTPATIENT_CLINIC_OR_DEPARTMENT_OTHER): Payer: Self-pay | Admitting: Clinical Neuropsychologist

## 2020-10-23 ENCOUNTER — Ambulatory Visit (HOSPITAL_BASED_OUTPATIENT_CLINIC_OR_DEPARTMENT_OTHER): Payer: Medicare PPO | Admitting: Hematology & Oncology

## 2020-11-10 DEATH — deceased

## 2021-02-03 ENCOUNTER — Other Ambulatory Visit: Payer: Self-pay

## 2021-04-21 ENCOUNTER — Telehealth: Payer: Self-pay

## 2021-04-21 NOTE — Telephone Encounter (Signed)
ONC REFERRAL RECEIVED AND SCANNED TO 7.13.22 SCAN ONLY

## 2022-02-22 ENCOUNTER — Other Ambulatory Visit: Payer: Self-pay

## 2022-06-29 ENCOUNTER — Other Ambulatory Visit: Payer: Self-pay
